# Patient Record
Sex: Female | Born: 1975
Health system: Southern US, Community
[De-identification: ages and names within clinical notes are randomized; demographics above are authoritative.]

## PROBLEM LIST (undated history)

## (undated) DIAGNOSIS — N760 Acute vaginitis: Secondary | ICD-10-CM

## (undated) DIAGNOSIS — K2970 Gastritis, unspecified, without bleeding: Secondary | ICD-10-CM

## (undated) DIAGNOSIS — J45909 Unspecified asthma, uncomplicated: Secondary | ICD-10-CM

## (undated) DIAGNOSIS — B9689 Other specified bacterial agents as the cause of diseases classified elsewhere: Secondary | ICD-10-CM

## (undated) DIAGNOSIS — J329 Chronic sinusitis, unspecified: Secondary | ICD-10-CM

## (undated) DIAGNOSIS — O00109 Unspecified tubal pregnancy without intrauterine pregnancy: Secondary | ICD-10-CM

## (undated) DIAGNOSIS — N898 Other specified noninflammatory disorders of vagina: Secondary | ICD-10-CM

## (undated) DIAGNOSIS — R079 Chest pain, unspecified: Secondary | ICD-10-CM

## (undated) DIAGNOSIS — N200 Calculus of kidney: Secondary | ICD-10-CM

## (undated) DIAGNOSIS — N6310 Unspecified lump in the right breast, unspecified quadrant: Secondary | ICD-10-CM

## (undated) DIAGNOSIS — I10 Essential (primary) hypertension: Secondary | ICD-10-CM

## (undated) DIAGNOSIS — N644 Mastodynia: Secondary | ICD-10-CM

## (undated) HISTORY — DX: Mastodynia: N64.4

## (undated) HISTORY — PX: CYSTECTOMY: SUR359

## (undated) HISTORY — PX: OTHER SURGICAL HISTORY: SHX169

## (undated) HISTORY — PX: TUBAL LIGATION: SHX77

## (undated) HISTORY — DX: Gastritis, unspecified, without bleeding: K29.70

## (undated) HISTORY — DX: Acute vaginitis: N76.0

## (undated) HISTORY — DX: Other specified bacterial agents as the cause of diseases classified elsewhere: B96.89

## (undated) HISTORY — DX: Chronic sinusitis, unspecified: J32.9

## (undated) HISTORY — DX: Essential (primary) hypertension: I10

## (undated) HISTORY — DX: Other specified noninflammatory disorders of vagina: N89.8

## (undated) HISTORY — DX: Chest pain, unspecified: R07.9

## (undated) HISTORY — DX: Unspecified lump in the right breast, unspecified quadrant: N63.10

## (undated) HISTORY — DX: Unspecified tubal pregnancy without intrauterine pregnancy: O00.109

---

## 2001-03-19 ENCOUNTER — Emergency Department (HOSPITAL_COMMUNITY): Admission: EM | Admit: 2001-03-19 | Discharge: 2001-03-19 | Payer: Self-pay | Admitting: *Deleted

## 2001-05-17 ENCOUNTER — Encounter: Payer: Self-pay | Admitting: Emergency Medicine

## 2001-05-17 ENCOUNTER — Emergency Department (HOSPITAL_COMMUNITY): Admission: EM | Admit: 2001-05-17 | Discharge: 2001-05-17 | Payer: Self-pay | Admitting: Emergency Medicine

## 2002-03-20 ENCOUNTER — Emergency Department (HOSPITAL_COMMUNITY): Admission: EM | Admit: 2002-03-20 | Discharge: 2002-03-20 | Payer: Self-pay | Admitting: Emergency Medicine

## 2002-08-09 ENCOUNTER — Emergency Department (HOSPITAL_COMMUNITY): Admission: EM | Admit: 2002-08-09 | Discharge: 2002-08-09 | Payer: Self-pay | Admitting: Emergency Medicine

## 2002-08-09 ENCOUNTER — Encounter: Payer: Self-pay | Admitting: Emergency Medicine

## 2003-08-11 ENCOUNTER — Emergency Department (HOSPITAL_COMMUNITY): Admission: EM | Admit: 2003-08-11 | Discharge: 2003-08-11 | Payer: Self-pay | Admitting: Emergency Medicine

## 2003-10-05 ENCOUNTER — Inpatient Hospital Stay (HOSPITAL_COMMUNITY): Admission: AD | Admit: 2003-10-05 | Discharge: 2003-10-05 | Payer: Self-pay | Admitting: *Deleted

## 2003-10-05 ENCOUNTER — Encounter: Payer: Self-pay | Admitting: Emergency Medicine

## 2003-10-08 ENCOUNTER — Inpatient Hospital Stay (HOSPITAL_COMMUNITY): Admission: AD | Admit: 2003-10-08 | Discharge: 2003-10-08 | Payer: Self-pay | Admitting: Obstetrics and Gynecology

## 2003-10-11 ENCOUNTER — Inpatient Hospital Stay (HOSPITAL_COMMUNITY): Admission: AD | Admit: 2003-10-11 | Discharge: 2003-10-11 | Payer: Self-pay | Admitting: *Deleted

## 2003-10-18 ENCOUNTER — Inpatient Hospital Stay (HOSPITAL_COMMUNITY): Admission: AD | Admit: 2003-10-18 | Discharge: 2003-10-18 | Payer: Self-pay | Admitting: *Deleted

## 2003-10-25 ENCOUNTER — Inpatient Hospital Stay (HOSPITAL_COMMUNITY): Admission: AD | Admit: 2003-10-25 | Discharge: 2003-10-25 | Payer: Self-pay | Admitting: *Deleted

## 2003-11-01 ENCOUNTER — Inpatient Hospital Stay (HOSPITAL_COMMUNITY): Admission: AD | Admit: 2003-11-01 | Discharge: 2003-11-01 | Payer: Self-pay | Admitting: *Deleted

## 2003-11-03 ENCOUNTER — Inpatient Hospital Stay (HOSPITAL_COMMUNITY): Admission: AD | Admit: 2003-11-03 | Discharge: 2003-11-03 | Payer: Self-pay | Admitting: *Deleted

## 2003-11-05 ENCOUNTER — Inpatient Hospital Stay (HOSPITAL_COMMUNITY): Admission: AD | Admit: 2003-11-05 | Discharge: 2003-11-05 | Payer: Self-pay | Admitting: *Deleted

## 2003-11-08 ENCOUNTER — Inpatient Hospital Stay (HOSPITAL_COMMUNITY): Admission: AD | Admit: 2003-11-08 | Discharge: 2003-11-08 | Payer: Self-pay | Admitting: *Deleted

## 2003-11-12 ENCOUNTER — Inpatient Hospital Stay (HOSPITAL_COMMUNITY): Admission: AD | Admit: 2003-11-12 | Discharge: 2003-11-12 | Payer: Self-pay | Admitting: Obstetrics and Gynecology

## 2003-11-29 ENCOUNTER — Inpatient Hospital Stay (HOSPITAL_COMMUNITY): Admission: AD | Admit: 2003-11-29 | Discharge: 2003-11-29 | Payer: Self-pay | Admitting: Obstetrics & Gynecology

## 2003-12-25 ENCOUNTER — Ambulatory Visit: Payer: Self-pay | Admitting: *Deleted

## 2004-05-19 ENCOUNTER — Inpatient Hospital Stay (HOSPITAL_COMMUNITY): Admission: AD | Admit: 2004-05-19 | Discharge: 2004-05-19 | Payer: Self-pay | Admitting: Obstetrics and Gynecology

## 2004-06-03 ENCOUNTER — Inpatient Hospital Stay (HOSPITAL_COMMUNITY): Admission: AD | Admit: 2004-06-03 | Discharge: 2004-06-03 | Payer: Self-pay | Admitting: Obstetrics & Gynecology

## 2004-07-01 ENCOUNTER — Ambulatory Visit (HOSPITAL_COMMUNITY): Admission: RE | Admit: 2004-07-01 | Discharge: 2004-07-01 | Payer: Self-pay | Admitting: Family Medicine

## 2004-07-08 ENCOUNTER — Inpatient Hospital Stay (HOSPITAL_COMMUNITY): Admission: AD | Admit: 2004-07-08 | Discharge: 2004-07-08 | Payer: Self-pay | Admitting: *Deleted

## 2004-07-15 ENCOUNTER — Inpatient Hospital Stay (HOSPITAL_COMMUNITY): Admission: AD | Admit: 2004-07-15 | Discharge: 2004-07-15 | Payer: Self-pay | Admitting: *Deleted

## 2004-07-18 ENCOUNTER — Ambulatory Visit: Payer: Self-pay | Admitting: Cardiovascular Disease

## 2004-09-05 ENCOUNTER — Ambulatory Visit: Payer: Self-pay

## 2004-09-09 ENCOUNTER — Encounter (HOSPITAL_COMMUNITY): Admission: RE | Admit: 2004-09-09 | Discharge: 2004-10-09 | Payer: Self-pay | Admitting: Obstetrics

## 2004-10-14 ENCOUNTER — Encounter (HOSPITAL_COMMUNITY): Admission: RE | Admit: 2004-10-14 | Discharge: 2004-11-13 | Payer: Self-pay | Admitting: Obstetrics

## 2004-11-14 ENCOUNTER — Inpatient Hospital Stay (HOSPITAL_COMMUNITY): Admission: AD | Admit: 2004-11-14 | Discharge: 2004-11-14 | Payer: Self-pay | Admitting: Obstetrics

## 2004-12-26 ENCOUNTER — Inpatient Hospital Stay (HOSPITAL_COMMUNITY): Admission: AD | Admit: 2004-12-26 | Discharge: 2004-12-26 | Payer: Self-pay | Admitting: Obstetrics

## 2005-01-08 ENCOUNTER — Inpatient Hospital Stay (HOSPITAL_COMMUNITY): Admission: AD | Admit: 2005-01-08 | Discharge: 2005-01-09 | Payer: Self-pay | Admitting: Obstetrics

## 2005-01-17 ENCOUNTER — Inpatient Hospital Stay (HOSPITAL_COMMUNITY): Admission: AD | Admit: 2005-01-17 | Discharge: 2005-01-20 | Payer: Self-pay | Admitting: Obstetrics

## 2005-08-26 ENCOUNTER — Emergency Department (HOSPITAL_COMMUNITY): Admission: EM | Admit: 2005-08-26 | Discharge: 2005-08-26 | Payer: Self-pay | Admitting: Family Medicine

## 2005-11-20 ENCOUNTER — Emergency Department (HOSPITAL_COMMUNITY): Admission: EM | Admit: 2005-11-20 | Discharge: 2005-11-20 | Payer: Self-pay | Admitting: Emergency Medicine

## 2006-02-01 ENCOUNTER — Inpatient Hospital Stay (HOSPITAL_COMMUNITY): Admission: AD | Admit: 2006-02-01 | Discharge: 2006-02-01 | Payer: Self-pay | Admitting: Gynecology

## 2006-02-03 ENCOUNTER — Inpatient Hospital Stay (HOSPITAL_COMMUNITY): Admission: AD | Admit: 2006-02-03 | Discharge: 2006-02-03 | Payer: Self-pay | Admitting: Gynecology

## 2006-02-05 ENCOUNTER — Ambulatory Visit: Payer: Self-pay | Admitting: Obstetrics & Gynecology

## 2006-02-05 ENCOUNTER — Ambulatory Visit (HOSPITAL_COMMUNITY): Admission: RE | Admit: 2006-02-05 | Discharge: 2006-02-05 | Payer: Self-pay | Admitting: Gynecology

## 2006-02-05 ENCOUNTER — Encounter (INDEPENDENT_AMBULATORY_CARE_PROVIDER_SITE_OTHER): Payer: Self-pay | Admitting: *Deleted

## 2006-02-11 ENCOUNTER — Ambulatory Visit: Payer: Self-pay | Admitting: *Deleted

## 2006-02-11 ENCOUNTER — Inpatient Hospital Stay (HOSPITAL_COMMUNITY): Admission: AD | Admit: 2006-02-11 | Discharge: 2006-02-11 | Payer: Self-pay | Admitting: Obstetrics and Gynecology

## 2006-03-05 ENCOUNTER — Ambulatory Visit: Payer: Self-pay | Admitting: Family Medicine

## 2006-03-17 ENCOUNTER — Inpatient Hospital Stay (HOSPITAL_COMMUNITY): Admission: AD | Admit: 2006-03-17 | Discharge: 2006-03-17 | Payer: Self-pay | Admitting: Obstetrics & Gynecology

## 2006-03-19 ENCOUNTER — Inpatient Hospital Stay (HOSPITAL_COMMUNITY): Admission: AD | Admit: 2006-03-19 | Discharge: 2006-03-19 | Payer: Self-pay | Admitting: Obstetrics and Gynecology

## 2006-03-21 ENCOUNTER — Ambulatory Visit: Payer: Self-pay | Admitting: Gynecology

## 2006-03-21 ENCOUNTER — Encounter (INDEPENDENT_AMBULATORY_CARE_PROVIDER_SITE_OTHER): Payer: Self-pay | Admitting: Specialist

## 2006-03-21 ENCOUNTER — Ambulatory Visit (HOSPITAL_COMMUNITY): Admission: AD | Admit: 2006-03-21 | Discharge: 2006-03-21 | Payer: Self-pay | Admitting: Family Medicine

## 2006-05-06 ENCOUNTER — Ambulatory Visit: Payer: Self-pay | Admitting: Gynecology

## 2006-06-24 ENCOUNTER — Emergency Department (HOSPITAL_COMMUNITY): Admission: EM | Admit: 2006-06-24 | Discharge: 2006-06-24 | Payer: Self-pay | Admitting: Family Medicine

## 2006-09-27 ENCOUNTER — Emergency Department (HOSPITAL_COMMUNITY): Admission: EM | Admit: 2006-09-27 | Discharge: 2006-09-27 | Payer: Self-pay | Admitting: Emergency Medicine

## 2006-12-01 ENCOUNTER — Emergency Department (HOSPITAL_COMMUNITY): Admission: EM | Admit: 2006-12-01 | Discharge: 2006-12-01 | Payer: Self-pay | Admitting: Emergency Medicine

## 2006-12-02 ENCOUNTER — Emergency Department (HOSPITAL_COMMUNITY): Admission: EM | Admit: 2006-12-02 | Discharge: 2006-12-02 | Payer: Self-pay | Admitting: Emergency Medicine

## 2007-01-20 ENCOUNTER — Inpatient Hospital Stay (HOSPITAL_COMMUNITY): Admission: AD | Admit: 2007-01-20 | Discharge: 2007-01-20 | Payer: Self-pay | Admitting: Obstetrics and Gynecology

## 2007-04-15 ENCOUNTER — Emergency Department (HOSPITAL_COMMUNITY): Admission: EM | Admit: 2007-04-15 | Discharge: 2007-04-15 | Payer: Self-pay | Admitting: Family Medicine

## 2007-07-07 ENCOUNTER — Emergency Department (HOSPITAL_COMMUNITY): Admission: EM | Admit: 2007-07-07 | Discharge: 2007-07-07 | Payer: Self-pay | Admitting: Emergency Medicine

## 2008-03-14 ENCOUNTER — Emergency Department (HOSPITAL_COMMUNITY): Admission: EM | Admit: 2008-03-14 | Discharge: 2008-03-14 | Payer: Self-pay | Admitting: Family Medicine

## 2008-05-30 ENCOUNTER — Emergency Department (HOSPITAL_COMMUNITY): Admission: EM | Admit: 2008-05-30 | Discharge: 2008-05-30 | Payer: Self-pay | Admitting: Family Medicine

## 2008-06-21 ENCOUNTER — Encounter (INDEPENDENT_AMBULATORY_CARE_PROVIDER_SITE_OTHER): Payer: Self-pay | Admitting: *Deleted

## 2008-06-21 DIAGNOSIS — F329 Major depressive disorder, single episode, unspecified: Secondary | ICD-10-CM

## 2008-09-05 ENCOUNTER — Emergency Department (HOSPITAL_COMMUNITY): Admission: EM | Admit: 2008-09-05 | Discharge: 2008-09-05 | Payer: Self-pay | Admitting: Family Medicine

## 2008-09-13 ENCOUNTER — Other Ambulatory Visit: Admission: RE | Admit: 2008-09-13 | Discharge: 2008-09-13 | Payer: Self-pay | Admitting: Family Medicine

## 2008-09-13 ENCOUNTER — Encounter: Payer: Self-pay | Admitting: Family Medicine

## 2008-09-13 ENCOUNTER — Ambulatory Visit: Payer: Self-pay | Admitting: Family Medicine

## 2008-09-13 DIAGNOSIS — N920 Excessive and frequent menstruation with regular cycle: Secondary | ICD-10-CM | POA: Insufficient documentation

## 2008-09-13 DIAGNOSIS — G43909 Migraine, unspecified, not intractable, without status migrainosus: Secondary | ICD-10-CM | POA: Insufficient documentation

## 2008-09-13 DIAGNOSIS — F17200 Nicotine dependence, unspecified, uncomplicated: Secondary | ICD-10-CM | POA: Insufficient documentation

## 2008-09-13 DIAGNOSIS — B9689 Other specified bacterial agents as the cause of diseases classified elsewhere: Secondary | ICD-10-CM

## 2008-09-13 DIAGNOSIS — O00109 Unspecified tubal pregnancy without intrauterine pregnancy: Secondary | ICD-10-CM

## 2008-09-13 DIAGNOSIS — N739 Female pelvic inflammatory disease, unspecified: Secondary | ICD-10-CM | POA: Insufficient documentation

## 2008-09-13 HISTORY — DX: Other specified bacterial agents as the cause of diseases classified elsewhere: B96.89

## 2008-09-13 HISTORY — DX: Unspecified tubal pregnancy without intrauterine pregnancy: O00.109

## 2008-09-13 LAB — CONVERTED CEMR LAB
BUN: 12 mg/dL (ref 6–23)
CO2: 27 meq/L (ref 19–32)
Chloride: 105 meq/L (ref 96–112)
Creatinine, Ser: 0.69 mg/dL (ref 0.40–1.20)
HCT: 38.9 % (ref 36.0–46.0)
Hemoglobin: 13.8 g/dL (ref 12.0–15.0)
Platelets: 234 10*3/uL (ref 150–400)
WBC: 5.7 10*3/uL (ref 4.0–10.5)

## 2008-09-20 ENCOUNTER — Encounter: Payer: Self-pay | Admitting: *Deleted

## 2008-10-01 ENCOUNTER — Ambulatory Visit (HOSPITAL_COMMUNITY): Admission: RE | Admit: 2008-10-01 | Discharge: 2008-10-01 | Payer: Self-pay | Admitting: Family Medicine

## 2008-10-29 ENCOUNTER — Ambulatory Visit: Payer: Self-pay | Admitting: Family Medicine

## 2008-10-29 DIAGNOSIS — N83209 Unspecified ovarian cyst, unspecified side: Secondary | ICD-10-CM

## 2008-10-29 HISTORY — DX: Unspecified ovarian cyst, unspecified side: N83.209

## 2008-10-31 ENCOUNTER — Telehealth: Payer: Self-pay | Admitting: Family Medicine

## 2008-10-31 ENCOUNTER — Encounter: Payer: Self-pay | Admitting: *Deleted

## 2008-11-14 ENCOUNTER — Telehealth (INDEPENDENT_AMBULATORY_CARE_PROVIDER_SITE_OTHER): Payer: Self-pay | Admitting: *Deleted

## 2008-11-14 ENCOUNTER — Ambulatory Visit (HOSPITAL_COMMUNITY): Admission: RE | Admit: 2008-11-14 | Discharge: 2008-11-14 | Payer: Self-pay | Admitting: Family Medicine

## 2009-01-01 ENCOUNTER — Ambulatory Visit: Payer: Self-pay | Admitting: Family Medicine

## 2009-03-29 ENCOUNTER — Ambulatory Visit: Payer: Self-pay | Admitting: Family Medicine

## 2009-04-18 ENCOUNTER — Ambulatory Visit: Payer: Self-pay | Admitting: Family Medicine

## 2009-04-18 ENCOUNTER — Ambulatory Visit (HOSPITAL_COMMUNITY): Admission: RE | Admit: 2009-04-18 | Discharge: 2009-04-18 | Payer: Self-pay | Admitting: Family Medicine

## 2009-04-18 ENCOUNTER — Encounter: Payer: Self-pay | Admitting: Family Medicine

## 2009-04-18 LAB — CONVERTED CEMR LAB: Chlamydia, DNA Probe: NEGATIVE

## 2009-04-22 ENCOUNTER — Telehealth: Payer: Self-pay | Admitting: Family Medicine

## 2009-08-16 ENCOUNTER — Encounter: Payer: Self-pay | Admitting: Family Medicine

## 2009-09-05 ENCOUNTER — Emergency Department (HOSPITAL_COMMUNITY): Admission: EM | Admit: 2009-09-05 | Discharge: 2009-09-05 | Payer: Self-pay | Admitting: Emergency Medicine

## 2009-11-25 ENCOUNTER — Encounter: Payer: Self-pay | Admitting: Family Medicine

## 2009-12-20 ENCOUNTER — Ambulatory Visit: Payer: Self-pay | Admitting: Family Medicine

## 2009-12-20 DIAGNOSIS — J019 Acute sinusitis, unspecified: Secondary | ICD-10-CM | POA: Insufficient documentation

## 2009-12-23 ENCOUNTER — Encounter: Payer: Self-pay | Admitting: Family Medicine

## 2009-12-23 DIAGNOSIS — J45909 Unspecified asthma, uncomplicated: Secondary | ICD-10-CM | POA: Insufficient documentation

## 2010-03-12 ENCOUNTER — Ambulatory Visit
Admission: RE | Admit: 2010-03-12 | Discharge: 2010-03-12 | Payer: Self-pay | Source: Home / Self Care | Attending: Family Medicine | Admitting: Family Medicine

## 2010-03-12 ENCOUNTER — Ambulatory Visit: Admit: 2010-03-12 | Payer: Self-pay

## 2010-03-12 ENCOUNTER — Encounter: Payer: Self-pay | Admitting: *Deleted

## 2010-03-12 DIAGNOSIS — R21 Rash and other nonspecific skin eruption: Secondary | ICD-10-CM | POA: Insufficient documentation

## 2010-04-01 NOTE — Miscellaneous (Signed)
Summary: ibuprofen refill via fax request  Clinical Lists Changes Needs appointment for any additional refills. Medications: Changed medication from IBUPROFEN 800 MG TABS (IBUPROFEN) one tab by mouth q8 hours as needed for pain to IBUPROFEN 800 MG TABS (IBUPROFEN) one tab by mouth q8 hours as needed for pain - Signed Rx of IBUPROFEN 800 MG TABS (IBUPROFEN) one tab by mouth q8 hours as needed for pain;  #90 x 0;  Signed;  Entered by: Doralee Albino MD;  Authorized by: Doralee Albino MD;  Method used: Handwritten    Prescriptions: IBUPROFEN 800 MG TABS (IBUPROFEN) one tab by mouth q8 hours as needed for pain  #90 x 0   Entered and Authorized by:   Doralee Albino MD   Signed by:   Doralee Albino MD on 11/25/2009   Method used:   Handwritten   RxID:   [id number]

## 2010-04-01 NOTE — Assessment & Plan Note (Signed)
Summary: Sinusitis acute   Vital Signs:  Patient profile:   35 year old female Height:      64 inches Weight:      123.0 pounds BMI:     21.19 Temp:     97.2 degrees F oral Pulse rate:   76 / minute BP sitting:   167 / 85  (left arm) Cuff size:   regular  Vitals Entered By: Gladstone Pih (March 29, 2009 2:32 PM)  CC: "head pressure & pain" Is Patient Diabetic? No Pain Assessment Patient in pain? no        Primary Care Provider:  Lequita Asal  MD  CC:  "head pressure & pain".  History of Present Illness: 34yo F c/o "head pressure"  "Head pressure"- x 9 days.  Worsening.  Tried ibuprofen, flonase, and mucinex.  Associated fevers (Tm 101).  +Rhinorhea, headache, and sinus pain mainly on left side.  Child was sick prior to her symptoms.    Current Medications (verified): 1)  Proventil Hfa 108 (90 Base) Mcg/act Aers (Albuterol Sulfate) .... As Needed 2)  Sumatriptan Succinate 50 Mg Tabs (Sumatriptan Succinate) .... One Tab By Mouth Q2 Hours As Needed For Migraine Symptoms. Do Not Exceed 4 Pills in 24 Hours. 3)  Ibuprofen 800 Mg Tabs (Ibuprofen) .... One Tab By Mouth Q8 Hours As Needed For Pain 4)  Chantix 1 Mg Tabs (Varenicline Tartrate) .... Take One Twice Daily - After  Allergies (verified): 1)  Doxycycline 2)  * Lorocet  Review of Systems      See HPI  Physical Exam  General:  VS Reviewed.Non ill-l appearing, NAD.  Head:  moderate ttp of L frontal and maxillary sinuses Eyes:  no injected conjunctiva Ears:  L ear full of cerumen R ear nl TM and canal Nose:  mild clear drainage; mildly inflammed turbinates but patent nares Mouth:  mild erythema of post pharynx; no exudate tonsils 1+ Neck:  supple, full ROM, no goiter or mass  Lungs:  Normal respiratory effort, chest expands symmetrically. Lungs are clear to auscultation, no crackles or wheezes. Heart:  Normal rate and regular rhythm. S1 and S2 normal without gallop, murmur, click, rub or other extra  sounds. Skin:  nl color and turgor Cervical Nodes:  no lad   Impression & Recommendations:  Problem # 1:  ACUTE SINUSITIS, UNSPECIFIED (ICD-461.9) Assessment New  Tx: Amox 875mg  two times a day x 10 days, flonase, tylenol and motrin.  f/u if symptoms worsen.  Pt has hx of recurrent sinusitis (twice yearly).  If it starts to occur more frequently, she may benefit from a ENT referral.   Her updated medication list for this problem includes:    Fluticasone Propionate 50 Mcg/act Susp (Fluticasone propionate) .Marland Kitchen... 2 spray each nostril daily    Amoxicillin 875 Mg Tabs (Amoxicillin) .Marland Kitchen... 1 tab by mouth two times a day x 10 days  Orders: [facility]- Est Level  3 (99213)  Complete Medication List: 1)  Proventil Hfa 108 (90 Base) Mcg/act Aers (Albuterol sulfate) .... As needed 2)  Sumatriptan Succinate 50 Mg Tabs (Sumatriptan succinate) .... One tab by mouth q2 hours as needed for migraine symptoms. do not exceed 4 pills in 24 hours. 3)  Ibuprofen 800 Mg Tabs (Ibuprofen) .... One tab by mouth q8 hours as needed for pain 4)  Chantix 1 Mg Tabs (Varenicline tartrate) .... Take one twice daily - after 5)  Fluticasone Propionate 50 Mcg/act Susp (Fluticasone propionate) .... 2 spray each nostril daily 6)  Amoxicillin 875 Mg Tabs (Amoxicillin) .Marland Kitchen.. 1 tab by mouth two times a day x 10 days  Patient Instructions: 1)  Please schedule a follow-up appointment as needed if symptoms worsen. 2)  Continue with the flonase.  Start the amoxicillin today.  You can take tylenol 500mg  (2 tablets) every 8 hours as needed for headache. Prescriptions: AMOXICILLIN 875 MG TABS (AMOXICILLIN) 1 tab by mouth two times a day x 10 days  #20 x 0   Entered and Authorized by:   Marisue Ivan  MD   Signed by:   Marisue Ivan  MD on 03/29/2009   Method used:   Electronically to        [facility] (retail)       [address]       [place], [place]  [id number]       Ph: [phone]       Fax:  [phone]   RxID:   [id number] FLUTICASONE PROPIONATE 50 MCG/ACT SUSP (FLUTICASONE PROPIONATE) 2 spray each nostril daily  #1 x 1   Entered and Authorized by:   Marisue Ivan  MD   Signed by:   Marisue Ivan  MD on 03/29/2009   Method used:   Electronically to        [facility] (retail)       [address]       [place], [place]  [address]       Ph: [phone]       Fax: [phone]   RxID:   [id number]

## 2010-04-01 NOTE — Miscellaneous (Signed)
  Clinical Lists Changes  Problems: Removed problem of PRODUCTIVE COUGH (ICD-786.2) Removed problem of VAGINAL DISCHARGE (ICD-623.5) Removed problem of RLQ PAIN (ICD-789.03) Removed problem of ROUTINE GYNECOLOGICAL EXAMINATION (ICD-V72.31)

## 2010-04-01 NOTE — Miscellaneous (Signed)
  Clinical Lists Changes  Problems: Changed problem from ASTHMA (ICD-493.90) to ASTHMA, INTERMITTENT (ICD-493.90) 

## 2010-04-01 NOTE — Progress Notes (Signed)
Summary: labs  Phone Note Outgoing Call   Call placed by: Lequita Asal  MD,  April 22, 2009 9:09 AM Summary of Call: please inform that GC/Chl test normal. labs only significant for BV as she was already informed Initial call taken by: Lequita Asal  MD,  April 22, 2009 9:09 AM    called pt and did not get answer and could not leave vm.Loralee Pacas CMA  April 22, 2009 10:06 AM  Appended Document: labs Pt notified

## 2010-04-01 NOTE — Assessment & Plan Note (Signed)
Summary: cold congestion,tcb   Vital Signs:  Patient profile:   35 year old female Weight:      128 pounds Temp:     98.6 degrees F oral Pulse rate:   76 / minute Pulse rhythm:   regular BP sitting:   128 / 88  (left arm) Cuff size:   regular  Vitals Entered By: Loralee Pacas CMA (December 20, 2009 11:06 AM)  Primary Care Provider:  Edd Arbour  CC:  sinus pain and cough .  History of Present Illness: 1) Sinus pain, cough: Reports worsening sinus pain and cough with purulent sputum x 2 weeks. +sick contac = kids at home w/ URI symptoms. Reports some mild occasional dyspnea as well - but has not had to use her albuterol inhaler. Reports subjective fever relieved by ibuprofen. Reports sinus pain and pressure. Quit smoking 2 months ago. Taking Mucinex, using Flonase with some relief   2) Tobacco use: quit smoking two months ago with Chantix. Denies side effects.   Denies chest pain / tightness, wheeze, nausea, vomiting or diarrhea, abdominal pain, ear pain / drainage   Med rec as below except for AMOXICILLIN   Current Medications (verified): 1)  Proventil Hfa 108 (90 Base) Mcg/act Aers (Albuterol Sulfate) .... As Needed 2)  Sumatriptan Succinate 50 Mg Tabs (Sumatriptan Succinate) .... One Tab By Mouth Q2 Hours As Needed For Migraine Symptoms. Do Not Exceed 4 Pills in 24 Hours. 3)  Ibuprofen 800 Mg Tabs (Ibuprofen) .... One Tab By Mouth Q8 Hours As Needed For Pain 4)  Chantix 1 Mg Tabs (Varenicline Tartrate) .... Take One Twice Daily - After 5)  Fluticasone Propionate 50 Mcg/act Susp (Fluticasone Propionate) .... 2 Spray Each Nostril Daily 6)  Amoxicillin 875 Mg Tabs (Amoxicillin) .Marland Kitchen.. 1 Tab By Mouth Two Times A Day X 10 Days  Allergies (verified): 1)  Doxycycline 2)  * Lorocet  Physical Exam  General:  VS Reviewed. Mildly ill-appearing, NAD.  Head:  + tender to palpation over frontal and maxillary sinuses  Eyes:  no conjunctivitis or drainage  Ears:  L ear full of  cerumen R ear nl TM and canal clear  Nose:  +congestion w/o rhinorrhea  Mouth:  moist membranes w/o erythema or exudate  Neck:  no lymphadenopathy   Lungs:  CTAB w/o wheeze or crackles  Heart:  normal rate, regular rhythm, and no murmur.   Abdomen:  soft, non-tender, normal bowel sounds, and no masses.   Skin:  no rashes.     Impression & Recommendations:  Problem # 1:  ACUTE SINUSITIS, UNSPECIFIED (ICD-461.9) Assessment New  Given worsening of symptoms, duration of symptoms, purulence, subjective fever, will treat with antibiotics as below. Continue Flonase. Follow up red flags reviewed.  Her updated medication list for this problem includes:    Fluticasone Propionate 50 Mcg/act Susp (Fluticasone propionate) .Marland Kitchen... 2 spray each nostril daily    Amoxicillin 875 Mg Tabs (Amoxicillin) .Marland Kitchen... 1 tab by mouth two times a day x 10 days  Orders: [facility]- Est  Level 4 (95621)  Problem # 2:  NICOTINE ADDICTION (ICD-305.1) Assessment: Improved  Tolerating Chantix well. Quit x 2 months. Congratulated on efforts to stay quit and counseled on pitfalls.   Her updated medication list for this problem includes:    Chantix 1 Mg Tabs (Varenicline tartrate) .Marland Kitchen... Take one twice daily - after  Orders: [facility]- Est  Level 4 (99214)  Complete Medication List: 1)  Proventil Hfa 108 (90 Base) Mcg/act Aers (Albuterol sulfate) .Marland KitchenMarland KitchenMarland Kitchen  As needed 2)  Sumatriptan Succinate 50 Mg Tabs (Sumatriptan succinate) .... One tab by mouth q2 hours as needed for migraine symptoms. do not exceed 4 pills in 24 hours. 3)  Ibuprofen 800 Mg Tabs (Ibuprofen) .... One tab by mouth q8 hours as needed for pain 4)  Chantix 1 Mg Tabs (Varenicline tartrate) .... Take one twice daily - after 5)  Fluticasone Propionate 50 Mcg/act Susp (Fluticasone propionate) .... 2 spray each nostril daily 6)  Amoxicillin 875 Mg Tabs (Amoxicillin) .Marland Kitchen.. 1 tab by mouth two times a day x 10 days Prescriptions: AMOXICILLIN 875 MG TABS (AMOXICILLIN) 1 tab by  mouth two times a day x 10 days  #20 x 0   Entered and Authorized by:   Bobby Rumpf  MD   Signed by:   Bobby Rumpf  MD on 12/20/2009   Method used:   Electronically to        [facility] [address] (retail)       [address]       [place], [place]  [id number]       Ph: [phone]       Fax: [phone]   RxID:   [id number]    Orders Added: 1)  [facility]- Est  Level 4 [24401]

## 2010-04-01 NOTE — Assessment & Plan Note (Signed)
Summary: sinus inf,tcb   Vital Signs:  Patient profile:   35 year old female Weight:      125.1 pounds Temp:     98.7 degrees F oral Pulse rate:   76 / minute Pulse rhythm:   regular BP sitting:   131 / 92  (right arm) Cuff size:   regular  Vitals Entered By: Loralee Pacas CMA (April 18, 2009 11:03 AM)  Primary Care Provider:  Lequita Asal  MD  CC:  cough and vaginal discharge.  History of Present Illness: 35 y/o female here c/o cough, vaginal discharge  cough- evaluated and treated for acute sinusitis  ~3 weeks ago with augementin. feels like symptoms have migrated to her chest. no trouble breathing, chest pain, fever, chills. +productive cough. h/o tobacco abuse. currently on chantix. quit x1 week.   vaginal discharge- reports thick, white discharge soon after antibiotics. did 7 day course of miconazole OTC. would like to be checked for STIs because partner formerly cheated. intermittent RLQ pain which has been chronic. no dysuria, fever, constipation/diarrhea, n/v.   Habits & Providers  Alcohol-Tobacco-Diet     Tobacco Status: quit < 6 months     Tobacco Counseling: to remain off tobacco products  Current Medications (verified): 1)  Proventil Hfa 108 (90 Base) Mcg/act Aers (Albuterol Sulfate) .... As Needed 2)  Sumatriptan Succinate 50 Mg Tabs (Sumatriptan Succinate) .... One Tab By Mouth Q2 Hours As Needed For Migraine Symptoms. Do Not Exceed 4 Pills in 24 Hours. 3)  Ibuprofen 800 Mg Tabs (Ibuprofen) .... One Tab By Mouth Q8 Hours As Needed For Pain 4)  Chantix 1 Mg Tabs (Varenicline Tartrate) .... Take One Twice Daily - After 5)  Fluticasone Propionate 50 Mcg/act Susp (Fluticasone Propionate) .... 2 Spray Each Nostril Daily 6)  Amoxicillin 875 Mg Tabs (Amoxicillin) .Marland Kitchen.. 1 Tab By Mouth Two Times A Day X 10 Days  Allergies (verified): 1)  Doxycycline 2)  * Lorocet  Past History:  Past Medical History: Asthma Depression- after miscarriage in 2008 Irregular  heart beat - during pregnancy in 2006 Tubal pregnancies- 2007 and 2008, both tubes ruptured  Social History: Smoking Status:  quit < 6 months  Physical Exam  General:  VS Reviewed. Non ill-l appearing, NAD.  Head:  no ttp of L frontal and maxillary sinuses Eyes:  no injected conjunctiva Nose:  no rhinorrhea Mouth:  Oral mucosa and oropharynx without lesions or exudates.  Teeth in good repair. Neck:  No deformities, masses, or tenderness noted. Lungs:  Normal respiratory effort, chest expands symmetrically. Lungs are clear to auscultation, no crackles or wheezes. Heart:  Normal rate and regular rhythm. S1 and S2 normal without gallop, murmur, click, rub or other extra sounds.   Impression & Recommendations:  Problem # 1:  PRODUCTIVE COUGH (ICD-786.2) Assessment New no evidence of pneumonia on CXR. recent antibiotics course, so dont think role for additional antibiotics. monitor for now. given h/o tobacco abuse, ?need for steroids. hold off for now. patient to call if symptoms worsen  Orders: CXR- 2view (CXR) [facility]- Est  Level 4 (45409)  Problem # 2:  VAGINAL DISCHARGE (ICD-623.5) Assessment: New wet prep with BV. will check GC/Chl.   Orders: Wet Prep- [facility] [phone]) GC/Chlamydia-[facility] (87591/87491) [facility]- Est  Level 4 (47829)  Complete Medication List: 1)  Proventil Hfa 108 (90 Base) Mcg/act Aers (Albuterol sulfate) .... As needed 2)  Sumatriptan Succinate 50 Mg Tabs (Sumatriptan succinate) .... One tab by mouth q2 hours as needed for migraine symptoms. do  not exceed 4 pills in 24 hours. 3)  Ibuprofen 800 Mg Tabs (Ibuprofen) .... One tab by mouth q8 hours as needed for pain 4)  Chantix 1 Mg Tabs (Varenicline tartrate) .... Take one twice daily - after 5)  Fluticasone Propionate 50 Mcg/act Susp (Fluticasone propionate) .... 2 spray each nostril daily 6)  Amoxicillin 875 Mg Tabs (Amoxicillin) .Marland Kitchen.. 1 tab by mouth two times a day x 10 days  Laboratory Results  Date/Time Received:  April 18, 2009 11:07 AM  Date/Time Reported: April 18, 2009 11:27 AM   Wet [place] Source: vaginal WBC/hpf: 5-10 Bacteria/hpf: 3+ Clue cells/hpf: few Yeast/hpf: none Trichomonas/hpf: none Comments: cocci and rod bacteria present ...........test performed by...........Marland KitchenTerese Door, CMA

## 2010-04-03 NOTE — Assessment & Plan Note (Signed)
Summary: sinus infection/eo   Vital Signs:  Patient profile:   35 year old female Height:      64 inches Weight:      135.3 pounds BMI:     23.31 Temp:     98.1 degrees F oral Pulse rate:   85 / minute BP sitting:   138 / 95  (right arm) Cuff size:   regular  Vitals Entered By: Jimmy Footman, CMA (March 12, 2010 11:34 AM) CC: cold sxs x3 weeks, HA, cough, runny nose (green) Is Patient Diabetic? No Pain Assessment Patient in pain? no      Comments rash on right arm x2 weeks   Primary Care Provider:  Edd Arbour  CC:  cold sxs x3 weeks, HA, cough, and runny nose (green).  History of Present Illness: 35 yo here for acute visit for sinusitis  Sinusitis: Has had 2-3 episodes of acute sinusitis per year.   3 weeks of rhinitis, left side predominant, +nosebleed, yellow green production with cough, postnasal drainage, sore throat.  No fever, dyspnea, vomiting, diarrhea, abd pain.  Taking mucinex without help.  Uses flonase also without improvement.  No antihsitamines.  Stopped smoking 5 months ago.  Rash:  1 month of rash on tops of feet, arms, legs.  Very itchy.  Has tried OTC antifungal for several weeks without improvement.  Has also tried her daughter's triamcinolone without help.  No new contact irritants or medicines.      Habits & Providers  Alcohol-Tobacco-Diet     Tobacco Status: quit  Current Medications (verified): 1)  Proventil Hfa 108 (90 Base) Mcg/act Aers (Albuterol Sulfate) .... As Needed 2)  Sumatriptan Succinate 50 Mg Tabs (Sumatriptan Succinate) .... One Tab By Mouth Q2 Hours As Needed For Migraine Symptoms. Do Not Exceed 4 Pills in 24 Hours. 3)  Ibuprofen 800 Mg Tabs (Ibuprofen) .... One Tab By Mouth Q8 Hours As Needed For Pain 4)  Chantix 1 Mg Tabs (Varenicline Tartrate) .... Take One Twice Daily - After 5)  Fluticasone Propionate 50 Mcg/act Susp (Fluticasone Propionate) .... 2 Spray Each Nostril Daily 6)  Amoxicillin 875 Mg Tabs (Amoxicillin) .Marland Kitchen.. 1  Tab By Mouth Two Times A Day X 10 Days 7)  Clotrimazole 1 % Crea (Clotrimazole) .... Apply To Affected Areas Twice A Day Dispense 60 Gm  Allergies: 1)  Doxycycline 2)  * Lorocet PMH-FH-SH reviewed for relevance  Social History: Smoking Status:  quit  Review of Systems      See HPI  Physical Exam  General:  VS Reviewed. NAD, nasal congestion  Ears:  L ear full of cerumen R ear nl TM and canal clear  Nose:  +congestion w/o rhinorrhea  Mouth:  moist membranes w/o erythema or exudate  Neck:  no lymphadenopathy   Lungs:  CTAB w/o wheeze or crackles  Heart:  normal rate, regular rhythm, and no murmur.   Extremities:  no LE edema Skin:  multiple small 2-5 mm hypoerpigmented macules over left shin, 2-3 cm pale annular lesion over right forearm, right foot scaly annular lesion scraped.   Impression & Recommendations:  Problem # 1:  ACUTE SINUSITIS, UNSPECIFIED (ICD-461.9)  Will treat for acute sinusitis with amoxicillin.   congratulated on smoking cessation.  If continues to have recurrent sinusitis even when not smoking, consider ENT referral.  Her updated medication list for this problem includes:    Fluticasone Propionate 50 Mcg/act Susp (Fluticasone propionate) .Marland Kitchen... 2 spray each nostril daily    Amoxicillin 875 Mg Tabs (Amoxicillin) .Marland KitchenMarland KitchenMarland KitchenMarland Kitchen  1 tab by mouth two times a day x 10 days  Orders: [facility]- Est  Level 4 (86578)  Problem # 2:  SKIN RASH (ICD-782.1) KOH negative although possibly partially treated.  Unclear diagnosis, precepted with Dr. Swaziland.  Will treat empirically with clotrimazole, patient advised to follow-up if no improvement.  Her updated medication list for this problem includes:    Clotrimazole 1 % Crea (Clotrimazole) .Marland Kitchen... Apply to affected areas twice a day dispense 60 gm  Orders: [facility] (46962) [facility]- Est  Level 4 (95284)  Problem # 3:  NICOTINE ADDICTION (ICD-305.1)  Has stopped smoking for 5 months.  Advised may discontinue chantix when prescription  from Dr. Raymondo Band is complete.  Her updated medication list for this problem includes:    Chantix 1 Mg Tabs (Varenicline tartrate) .Marland Kitchen... Take one twice daily - after  Orders: [facility]- Est  Level 4 ([address]214)  Complete Medication List: 1)  Proventil Hfa 108 (90 Base) Mcg/act Aers (Albuterol sulfate) .... As needed 2)  Sumatriptan Succinate 50 Mg Tabs (Sumatriptan succinate) .... One tab by mouth q2 hours as needed for migraine symptoms. do not exceed 4 pills in 24 hours. 3)  Ibuprofen 800 Mg Tabs (Ibuprofen) .... One tab by mouth q8 hours as needed for pain 4)  Chantix 1 Mg Tabs (Varenicline tartrate) .... Take one twice daily - after 5)  Fluticasone Propionate 50 Mcg/act Susp (Fluticasone propionate) .... 2 spray each nostril daily 6)  Amoxicillin 875 Mg Tabs (Amoxicillin) .Marland Kitchen.. 1 tab by mouth two times a day x 10 days 7)  Clotrimazole 1 % Crea (Clotrimazole) .... Apply to affected areas twice a day dispense 60 gm  Patient Instructions: 1)  Clotrimazole for rash, follow-up in 1 month 2)  Amoxicillin for sinus infection 3)  Congrats on stopping smoking! Prescriptions: AMOXICILLIN 875 MG TABS (AMOXICILLIN) 1 tab by mouth two times a day x 10 days  #20 x 0   Entered and Authorized by:   Delbert Harness MD   Signed by:   Delbert Harness MD on 03/12/2010   Method used:   Electronically to        [facility] [address] (retail)       [address]       [name], [place]  [id number]       Ph: [phone]       Fax: [phone]   RxID:   [id number] CLOTRIMAZOLE 1 % CREA (CLOTRIMAZOLE) apply to affected areas twice a day Dispense 60 gm  #1 x 0   Entered and Authorized by:   Delbert Harness MD   Signed by:   Delbert Harness MD on 03/12/2010   Method used:   Electronically to        [facility] [address] (retail)       [address]       [place], [place]  [id number]       Ph: [phone]       Fax: [phone]   RxID:   [id number]    Orders Added: 1)  [facility]  [87220] 2)  [facility]- Est  Level 4 [10626]  Appended Document: KOH  = negative    Lab Visit  Laboratory Results  Date/Time Received: March 12, 2010 2:00 PM  Date/Time Reported: March 13, 2010 5:26 PM   Other Tests  Skin KOH: Negative Comments: ...............test performed by......Marland KitchenBonnie A. Swaziland, MLS (ASCP)cm   Orders Today:

## 2010-04-03 NOTE — Letter (Signed)
Summary: Out of Work  [facility]  [address]   [place], [place] [address]   Phone: [phone]  Fax: [phone]    March 12, 2010   Employee:  KINSIE BELFORD    To Whom It May Concern:   For Medical reasons, please excuse the above named employee from work for the following dates:  March 12, 2010    If you need additional information, please feel free to contact our office.         Sincerely,    Jimmy Footman, CMA

## 2010-05-18 LAB — GC/CHLAMYDIA PROBE AMP, GENITAL: Chlamydia, DNA Probe: NEGATIVE

## 2010-05-18 LAB — POCT URINALYSIS DIP (DEVICE)
Protein, ur: NEGATIVE mg/dL
Specific Gravity, Urine: 1.025 (ref 1.005–1.030)
Urobilinogen, UA: 1 mg/dL (ref 0.0–1.0)
pH: 6 (ref 5.0–8.0)

## 2010-05-18 LAB — WET PREP, GENITAL
Clue Cells Wet Prep HPF POC: NONE SEEN
Trich, Wet Prep: NONE SEEN

## 2010-06-03 ENCOUNTER — Inpatient Hospital Stay (INDEPENDENT_AMBULATORY_CARE_PROVIDER_SITE_OTHER)
Admission: RE | Admit: 2010-06-03 | Discharge: 2010-06-03 | Disposition: A | Discharge status: Home / Self Care | Payer: Medicaid Other | Source: Ambulatory Visit | Attending: Family Medicine | Admitting: Family Medicine

## 2010-06-03 DIAGNOSIS — J309 Allergic rhinitis, unspecified: Secondary | ICD-10-CM

## 2010-06-08 LAB — POCT RAPID STREP A (OFFICE): Streptococcus, Group A Screen (Direct): NEGATIVE

## 2010-06-13 ENCOUNTER — Inpatient Hospital Stay (INDEPENDENT_AMBULATORY_CARE_PROVIDER_SITE_OTHER)
Admission: RE | Admit: 2010-06-13 | Discharge: 2010-06-13 | Disposition: A | Discharge status: Home / Self Care | Payer: Medicaid Other | Source: Ambulatory Visit | Attending: Family Medicine | Admitting: Family Medicine

## 2010-06-13 DIAGNOSIS — J019 Acute sinusitis, unspecified: Secondary | ICD-10-CM

## 2010-06-16 LAB — POCT URINALYSIS DIP (DEVICE)
Ketones, ur: NEGATIVE mg/dL
Protein, ur: NEGATIVE mg/dL
Urobilinogen, UA: 0.2 mg/dL (ref 0.0–1.0)
pH: 5.5 (ref 5.0–8.0)

## 2010-06-16 LAB — WET PREP, GENITAL: Trich, Wet Prep: NONE SEEN

## 2010-06-23 ENCOUNTER — Other Ambulatory Visit: Payer: Self-pay | Admitting: Family Medicine

## 2010-06-23 ENCOUNTER — Encounter: Payer: Self-pay | Admitting: Family Medicine

## 2010-06-23 ENCOUNTER — Ambulatory Visit (INDEPENDENT_AMBULATORY_CARE_PROVIDER_SITE_OTHER): Payer: Medicaid Other | Admitting: Family Medicine

## 2010-06-23 VITALS — BP 147/94 | HR 76 | Temp 98.6°F | Ht 63.0 in | Wt 143.0 lb

## 2010-06-23 DIAGNOSIS — R21 Rash and other nonspecific skin eruption: Secondary | ICD-10-CM

## 2010-06-23 MED ORDER — KETOCONAZOLE 2 % EX SHAM: MEDICATED_SHAMPOO | CUTANEOUS | Status: AC

## 2010-06-23 MED ORDER — FLUCONAZOLE 150 MG PO TABS: 150.0000 mg | ORAL_TABLET | ORAL | Status: DC

## 2010-06-23 NOTE — Assessment & Plan Note (Signed)
Skin biopsy procedure:  Pt was consented.  No questions or concerns.  Area was cleaned with betadine and alcohol.  Prepped in normal sterile fashion.  Anesthetized with local Lidocaine.  4mm skin biopsy performed.  Pt tolerated procedure well.  Minimal blood loss.  No immediate complications  DDX include tinea, inflammatory response, allergic reaction, systemic condition.  Presentation seems most consistent with resistant tinea.  Will treat with oral Diflucan and Nizoral shampoo.  Will also get a skin biopsy today to confirm diagnosis.

## 2010-06-23 NOTE — Progress Notes (Signed)
  Subjective:    Patient ID: Gail Bass, female    DOB: [birth year], 35 y.o.   MRN: [id number]  HPI 1. Skin rash:  Has been there for about 6 months.  Started off on her back and then was on her legs.  The spots on her back resolved but they persist on her legs and are getting worse.  Described as starting off as a small, red patch that then expands to have a red border and area of clearing.  She initially tried Triamcinolone cream which didn't help.  Was seen here a couple of months ago and they tried to do a scraping but didn't see anything, specifically no fungus.  They still tried treating her with Clotrimizole which she used for a couple of months and if anything she thinks that the rash got worse.  The rash is itchy.  She has been on Amoxicillin and Augmentin for sinus infections during this time.  She also just recently was diagnosed with vaginal candidiasis and was given a one time Rx of Diflucan which she took today.   Review of Systems Denies fevers, chills, muscle aches, headaches, fatigue, joint pains, concern for GC/Chlamydia infection    Objective:   Physical Exam  Constitutional: She appears well-nourished. No distress.  Eyes: Conjunctivae are normal.  Neck: Normal range of motion. Neck supple.  Cardiovascular: Normal rate and regular rhythm.   Pulmonary/Chest: Effort normal and breath sounds normal.  Abdominal: Soft.  Musculoskeletal: Normal range of motion. She exhibits no tenderness.  Lymphadenopathy:    She has no cervical adenopathy.  Neurological: No cranial nerve deficit. Coordination normal.  Skin: Skin is warm and dry. Rash noted.  Psychiatric:       Multiple areas of skin rash on her legs that goes from her ankles up to her thighs.  Red patches.  Larger areas have well, demarcated, red borders.  No signs of swelling or warmth.  Doesn't appear infected.          Assessment & Plan:

## 2010-06-23 NOTE — Patient Instructions (Signed)
Take one of the Diflucan in 2 days, then take 1 a week

## 2010-06-24 ENCOUNTER — Telehealth: Payer: Self-pay | Admitting: Family Medicine

## 2010-06-24 MED ORDER — CLOBETASOL PROPIONATE 0.05 % EX CREA: TOPICAL_CREAM | Freq: Two times a day (BID) | CUTANEOUS | Status: AC

## 2010-06-24 NOTE — Progress Notes (Signed)
  Subjective:    Patient ID: Gail Bass, female    DOB: 20-Apr-1975, 35 y.o.   MRN: [id number]  HPI Skin biopsy shows: Spongiotic Dermatitis.  Will treat with Clobetasol Cream.  Answered all patients questions and concerns via telephone.   Review of Systems     Objective:   Physical Exam        Assessment & Plan:

## 2010-06-24 NOTE — Telephone Encounter (Signed)
Discussed with patient the results of her skin biopsy.  Indicating spongiotic dermatitis.  Advised her that she doesn't need to take the anti-fungal medication and that I have prescribed a steroid cream for her to use.  Answered all questions and concerns.  Advised her to return to clinic if not better in a couple of weeks.  Pt voiced her understanding.

## 2010-07-07 ENCOUNTER — Other Ambulatory Visit (HOSPITAL_COMMUNITY)
Admission: RE | Admit: 2010-07-07 | Discharge: 2010-07-07 | Disposition: A | Discharge status: Home / Self Care | Payer: Medicaid Other | Source: Ambulatory Visit | Attending: Family Medicine | Admitting: Family Medicine

## 2010-07-07 ENCOUNTER — Other Ambulatory Visit: Payer: Self-pay | Admitting: Family Medicine

## 2010-07-07 ENCOUNTER — Ambulatory Visit (INDEPENDENT_AMBULATORY_CARE_PROVIDER_SITE_OTHER): Payer: Medicaid Other | Admitting: Family Medicine

## 2010-07-07 ENCOUNTER — Encounter: Payer: Self-pay | Admitting: Family Medicine

## 2010-07-07 VITALS — BP 130/87 | HR 61 | Temp 97.9°F | Ht 63.0 in | Wt 138.5 lb

## 2010-07-07 DIAGNOSIS — Z01419 Encounter for gynecological examination (general) (routine) without abnormal findings: Secondary | ICD-10-CM | POA: Insufficient documentation

## 2010-07-07 DIAGNOSIS — Z124 Encounter for screening for malignant neoplasm of cervix: Secondary | ICD-10-CM

## 2010-07-07 DIAGNOSIS — Z0000 Encounter for general adult medical examination without abnormal findings: Secondary | ICD-10-CM

## 2010-07-07 DIAGNOSIS — B9689 Other specified bacterial agents as the cause of diseases classified elsewhere: Secondary | ICD-10-CM

## 2010-07-07 DIAGNOSIS — N760 Acute vaginitis: Secondary | ICD-10-CM

## 2010-07-07 LAB — POCT WET PREP (WET MOUNT)

## 2010-07-07 LAB — POCT URINALYSIS DIPSTICK
Glucose, UA: NEGATIVE
Leukocytes, UA: NEGATIVE
Nitrite, UA: NEGATIVE
Urobilinogen, UA: 0.2

## 2010-07-07 LAB — POCT UA - MICROSCOPIC ONLY

## 2010-07-07 MED ORDER — METRONIDAZOLE 500 MG PO TABS: 500.0000 mg | ORAL_TABLET | Freq: Two times a day (BID) | ORAL | Status: AC

## 2010-07-07 MED ORDER — IBUPROFEN 800 MG PO TABS: 800.0000 mg | ORAL_TABLET | Freq: Three times a day (TID) | ORAL | Status: DC | PRN

## 2010-07-07 NOTE — Progress Notes (Signed)
  Subjective:    Patient ID: Gail Bass, female    DOB: 06-10-1975, 35 y.o.   MRN: [id number]  HPI 1. CPE Normal physical exam, slightly overweight.   2. PAP Performed, normal appearing cervix and vagina.  3. Vaginal Exam for Vaginosis/discharge Normal appearing vagina. Had thin white discharge, not malodorous.  4. Tobacco abuse counseling Patient off chantix, successfully quit since last august.  5. Weight gain Patient thinks this is increased PO intake 2nd to cessation of tobacco use. Counseled her on healthy balanced diet options.  6. Rash to legs. Rash has resolved with he use of clobetasol cream. Bx showed: SPONGIOTIC DERMATITIS   Review of Systems  All other systems reviewed and are negative.       Objective:   Physical Exam  Nursing note and vitals reviewed. Constitutional: She is oriented to person, place, and time. She appears well-developed and well-nourished. No distress.  HENT:  Head: Normocephalic and atraumatic.  Eyes: Conjunctivae and EOM are normal. Pupils are equal, round, and reactive to light. Right eye exhibits no discharge. Left eye exhibits no discharge. No scleral icterus.  Neck: Normal range of motion. Neck supple. No JVD present. No tracheal deviation present. No thyromegaly present.  Cardiovascular: Normal rate, regular rhythm and normal heart sounds.  Exam reveals no gallop and no friction rub.   No murmur heard. Pulmonary/Chest: Breath sounds normal. No stridor. No respiratory distress. She has no wheezes. She has no rales.  Abdominal: Soft. Bowel sounds are normal. She exhibits no distension and no mass. There is no tenderness. There is no rebound and no guarding.  Genitourinary: Vaginal discharge found.  Musculoskeletal: She exhibits no edema and no tenderness.  Lymphadenopathy:    She has no cervical adenopathy.  Neurological: She is alert and oriented to person, place, and time. No cranial nerve deficit.  Skin: Skin is warm. No rash  noted. No erythema.  Psychiatric: She has a normal mood and affect. Her behavior is normal.        Assessment & Plan:  1. CPE Normal physical exam, slightly overweight.   2. PAP Performed, normal appearing cervix and vagina. Will mail letter to patient when results are back.   3. Vaginal Exam for Vaginosis/discharge Normal appearing vagina. Had thin white discharge, not malodorous. Appears like Bacterial vaginosis, will await wet prep results. U/A, GC/CHl, HIV  4. Tobacco abuse counseling Patient off chantix, successfully quit since last august.  5. Weight gain Patient thinks this is increased PO intake 2nd to cessation of tobacco use. Counseled her on healthy balanced diet options.  6. Rash to legs. Rash has resolved with he use of clobetasol cream. Bx showed: SPONGIOTIC DERMATITIS

## 2010-07-08 ENCOUNTER — Encounter: Payer: Self-pay | Admitting: Family Medicine

## 2010-07-08 LAB — GC/CHLAMYDIA PROBE AMP, GENITAL: GC Probe Amp, Genital: NEGATIVE

## 2010-07-08 LAB — HIV ANTIBODY (ROUTINE TESTING W REFLEX): HIV: NONREACTIVE

## 2010-07-18 NOTE — Op Note (Signed)
NAMEMILADY, FLEENER NO.:  [id number]   MEDICAL RECORD NO.:  [id number]          PATIENT TYPE:  AMB   LOCATION:  [facility]                           [facility]ACILITY:  [facility]   PHYSICIAN:  Lesly Dukes, M.D. DATE OF BIRTH:  [birth year]   DATE OF PROCEDURE:  02/05/2006  DATE OF DISCHARGE:                               OPERATIVE REPORT   PREOPERATIVE DIAGNOSIS:  A 35 year old, para 3-0-1-3, with right ectopic  pregnancy, abdominal pain and small hemoperitoneum.   POSTOPERATIVE DIAGNOSIS:  64. A 35 year old, para 3-0-1-3, with right ectopic pregnancy,      abdominal pain and small hemoperitoneum.  2. Paraovarian cyst on the right adnexa.   OPERATION/PROCEDURE:  1. Exploratory laparoscopy.  2. Right salpingectomy.  3. Remove right ectopic pregnancy.  4. Mild lysis of adhesions.   SURGEON:  Lesly Dukes, M.D.  Ginger Carne, M.D.   ANESTHESIA:  General.   SPECIMENS:  Right fallopian tube, ectopic tube, to pathology.   ESTIMATED BLOOD LOSS:  Minimal.   COMPLICATIONS:  None.   FINDINGS:  Normal size and contour uterus. Right ectopic pregnancy with  small amount of blood in the cul-de-sac.  Grossly normal ovaries on both  sides.  Left fallopian tube does not have excessive adhesions.  There  are no adhesions around the liver.   DESCRIPTION OF PROCEDURE:  After informed consent was obtained, the  patient was taken to the operating room where general anesthesia was  induced.  The patient was placed in the dorsal lithotomy position and  prepped and draped in the normal sterile fashion.  Foley was placed in  the bladder.  A Pelosi uterine manipulator was placed into the uterus  via the cervix.  Gown and gloves were changed.  Using the open  technique, an infraumbilical skin incision was made with the scalpel and  again using the open laparoscopy method, the fascia and peritoneum were  identified and bluntly entered.  The Hasson trocar was introduced and  pneumoperitoneum was achieved with CO2.  A laparoscope was introduced  into the abdomen and the right ectopic was found.  Two 5 mm ports were  placed under direct visualization over the left-hand side.  Using the  coagulation, tripolar, the right fallopian tube was removed without any  bleeding or damage to the surrounding structures.  The 10 mm scope was  replaced with a 5 mm scope and Endopouch was introduced into the  umbilical port and ectopic pregnancy was removed from the abdomen.  The  10 mm scope was then replaced and once again hemostasis was assured.  Pneumoperitoneum was reduced and all ports were removed.  Umbilical port  and laparoscopic was removed with one unit to avoid hernia.  The fascia which had already been tagged at the umbilicus was closed  with 0 Vicryl.  All skin incisions were closed with 4-0 Vicryl in  subcuticular fashion.  The patient tolerated the procedure well.  The  Loyal Gambler was removed from the uterus and __________ was found to be  hemostatic.  All counts were correct x2.  ______________________________  Lesly Dukes, M.D.     KHL/MEDQ  D:  02/05/2006  T:  02/05/2006  Job:  [id number]

## 2010-07-18 NOTE — H&P (Signed)
Gail Bass, Gail Bass NO.:  [id number]   MEDICAL RECORD NO.:  [id number]           PATIENT TYPE:   LOCATION:  [facility]                    [facility]   PHYSICIAN:  Allie Bossier, MD        DATE OF BIRTH:  Sep 17, 1975   DATE OF SERVICE:                           PRE-OP HISTORY & PHYSICAL   This is an Operative Note.   PREOPERATIVE DIAGNOSIS:  Left ectopic pregnancy.   POSTOPERATIVE DIAGNOSIS:  Rupturing left ectopic pregnancy.   SURGEONS:  1. Clarisa Kindred, M.D.  2. Alfonzo Feller, M.D.   COMPLICATIONS:  None.   EBL:  200 mL.   SPECIMENS:  A portion of left ova duct.   PROCEDURE:  Left partial salpingectomy via laparoscopy and removal of  left ectopic pregnancy.   DETAILS OF THE PROCEDURE AND FINDINGS:  The risks, benefits and  alternatives of surgery were explained, understood and accepted,  consents were signed.  She was taken to the operating room and placed in  the dorsal lithotomy position.  General anesthesia was then applied, the  vagina and abdomen were prepped and draped in the usual sterile fashion.  A Hulka manipulator was placed.  Gloves were changed, attention was  turned to the abdomen.  A vertical umbilical incision was made.  A  Veress needle was placed intraperitoneally.  CO2 was used to inflate the  abdomen to approximately 2.5L.  An 11 mm trocar was then placed,  laparoscopy confirmed correct placement.  In each lower quadrant, under  direct laparoscopic visualization, 5 mm ports were placed, taking care  to avoid the inferior epigastric vessel.  The patient was placed in  Trendelenburg position and the pelvis was inspected in an alpha manner  beginning with the right adnexa.  The ovary was normal.  The ova duct  was absent.  The left ovary was normal but the left ova duct showed an  obvious ampullary ectopic pregnancy with a moderate amount of blood in  the pelvis, a small amount of blood oozing from the fimbriated end.  Using the  Harmonic scalpel, the distal portion of the ova duct was  removed.  There was a small amount of oozing from the pedicle and the  gyrus was then used to apply cautery and yield excellent hemostasis.  The pelvis was irrigated.  Please note that to facilitate removal of the  specimen.  A third 5 mm port was placed in the patient's left middle  quadrant of her abdomen under direct laparoscopic visualization.  The  specimen was removed via the Endopouch and the pelvis was again  irrigated and inspected; all pedicles were noted to be hemostatic.  She  tolerated the procedure well.  The trocars were all removed, the CO2 was  allowed to escape from the abdomen and the fascia was closed at the  umbilicus with a figure-of-eight #0 Vicryl suture.  The left lower  quadrant port had some oozing and a figure-of-eight suture was placed in  the fascia at this point as well.  Marcaine 0.05% was injected in the  subcutaneous tissue at all sites, #4-0 Vicryl was used to do a  subcuticular closure at all sites.  Instrument, sponge, needle counts  were correct.  Hulka manipulator was removed.  She tolerated the  procedure well.  She was extubated and taken to the recovery room in  stable condition.   Please note that prior to the surgery her bladder was emptied with a  Robinson catheter.      Allie Bossier, MD     MCD/MEDQ  D:  03/21/2006  T:  03/21/2006  Job:  [id number]

## 2010-07-18 NOTE — H&P (Signed)
NAMESEONA, Gail NO.:  [id number]   MEDICAL RECORD NO.:  [id number]          PATIENT TYPE:  MAT   LOCATION:  [facility]                          [facility]   PHYSICIAN:  Allie Bossier, MD        DATE OF BIRTH:  10-09-1975   DATE OF ADMISSION:  03/21/2006  DATE OF DISCHARGE:                              HISTORY & PHYSICAL   Gail Bass is a 35 year old, single, white, gravida 6, para 3 with a  history of two ectopic pregnancies. She comes in with a one-week history  of some spotting.  Her bleeding became heavier two days ago. She has  also complaining of all over pelvic pain.  She says that she was  suspicious for an ectopic several days ago but chose to come today when  her pain became worse.  Of note, she had a laparoscope right  salpingectomy February 05, 2006 for an ectopic.  She previously had  methotrexate in 2005.  She absolutely refuses further methotrexate  treatment.   PAST MEDICAL HISTORY:  Significant for history of abnormal Pap smears,  gonorrhea, Chlamydia in her teenage years and asthma.  Of note, she says  that she uses albuterol inhaler on a p.r.n. basis and she has not used  it at all in the last week.   SOCIAL HISTORY:  She smokes half a pack a day. She reports some  marijuana use in the last three weeks.  She denies cocaine or other  illegal substances.   REVIEW OF SYSTEMS:  She wishes no more children at this point.  She  understands that should she desire children in the future that  reproductive technology would be required if both of her tubes are  absent.   MEDICATIONS:  None.   ALLERGIES:  No known drug allergies. No latex allergy.   PHYSICAL EXAMINATION:  VITAL SIGNS:  Stable, afebrile.  HEENT:  Normal.  HEART:  Regular rate and rhythm.  ABDOMEN:  Tender throughout.  Ultrasound confirmed the finding of a left  gestational sac with measurements of 2.5 x 0.9 x 1.5 cm.  Q Beta HCG is  1455 and CBC and CMETA are pending.   ASSESSMENT/PLAN:  Left ectopic.  Again I have counseled her extensive  about the removal of this tube.  She adamantly declines methotrexate.  Will proceed as planned.  She again was counseled on the risks of  surgery including but not limited to infection, bleeding, and damage to  pelvic organs.  She understands that future pregnancies would require  artificial reproductive techniques.      Allie Bossier, MD  Electronically Signed     MCD/MEDQ  D:  03/21/2006  T:  03/21/2006  Job:  [id number]

## 2010-07-18 NOTE — Group Therapy Note (Signed)
Gail Bass, Gail Bass NO.:  [id number]   MEDICAL RECORD NO.:  [id number]          PATIENT TYPE:  [facility]   LOCATION:  [facility]                   [facility]ACILITY[facility]   PHYSICIAN:  Ellis Parents, MD    DATE OF BIRTH:  [birth year]   DATE OF SERVICE:  12/25/2003                                    CLINIC NOTE   REASON FOR VISIT:  This 35 year old gravida 1 para 0 ectopic 1 was treated  at [facility] in late August for an unruptured tubal pregnancy.  The  treatment was with methotrexate.  The patient's beta hCGs ranged from 105 to  less than 2.  The value of less than 2 was on November 29, 2003.  The  patient is doing well without any complaints.  Her last menstrual period  began 5 days ago.  The patient is using condoms for contraception.  No  physical examination is performed.  The patient is to return in 1 month for  a Pap smear.  She does have a history of dysplasia and at some time in the  past had a colposcopy and cervical biopsy.      SA/MEDQ  D:  12/25/2003  T:  12/25/2003  Job:  [id number]

## 2010-11-14 ENCOUNTER — Inpatient Hospital Stay (INDEPENDENT_AMBULATORY_CARE_PROVIDER_SITE_OTHER)
Admission: RE | Admit: 2010-11-14 | Discharge: 2010-11-14 | Disposition: A | Discharge status: Home / Self Care | Payer: Medicaid Other | Source: Ambulatory Visit | Attending: Emergency Medicine | Admitting: Emergency Medicine

## 2010-11-14 DIAGNOSIS — N760 Acute vaginitis: Secondary | ICD-10-CM

## 2010-11-15 LAB — GC/CHLAMYDIA PROBE AMP, GENITAL
Chlamydia, DNA Probe: NEGATIVE
GC Probe Amp, Genital: NEGATIVE

## 2010-11-26 LAB — GC/CHLAMYDIA PROBE AMP, GENITAL: GC Probe Amp, Genital: NEGATIVE

## 2010-11-26 LAB — POCT PREGNANCY, URINE
Operator id: 239701
Preg Test, Ur: NEGATIVE

## 2010-11-26 LAB — POCT URINALYSIS DIP (DEVICE)
Operator id: 247071
Protein, ur: 30 — AB
Specific Gravity, Urine: 1.025
Urobilinogen, UA: 0.2

## 2010-11-26 LAB — WET PREP, GENITAL

## 2010-12-09 LAB — URINALYSIS, ROUTINE W REFLEX MICROSCOPIC
Hgb urine dipstick: NEGATIVE
Specific Gravity, Urine: >1.03 — ABNORMAL HIGH
Urobilinogen, UA: 0.2

## 2010-12-09 LAB — WET PREP, GENITAL
Trich, Wet Prep: NONE SEEN
Yeast Wet Prep HPF POC: NONE SEEN

## 2010-12-09 LAB — GC/CHLAMYDIA PROBE AMP, GENITAL: GC Probe Amp, Genital: POSITIVE — AB

## 2010-12-11 LAB — POCT URINALYSIS DIP (DEVICE)
Bilirubin Urine: NEGATIVE
Ketones, ur: NEGATIVE
Nitrite: NEGATIVE
Specific Gravity, Urine: 1.01
pH: 6.5

## 2010-12-11 LAB — URINE MICROSCOPIC-ADD ON

## 2010-12-11 LAB — POCT I-STAT CREATININE
Creatinine, Ser: 1
Operator id: 161631

## 2010-12-11 LAB — POCT PREGNANCY, URINE: Preg Test, Ur: NEGATIVE

## 2010-12-11 LAB — CBC
Platelets: 204
RBC: 4.38
WBC: 12.6 — ABNORMAL HIGH

## 2010-12-11 LAB — I-STAT 8, (EC8 V) (CONVERTED LAB)
Acid-Base Excess: 1
HCT: 47 — ABNORMAL HIGH
Hemoglobin: 16 — ABNORMAL HIGH
Operator id: 161631
Potassium: 3.5
Sodium: 135
TCO2: 30
pH, Ven: 7.322 — ABNORMAL HIGH

## 2010-12-11 LAB — URINALYSIS, ROUTINE W REFLEX MICROSCOPIC
Glucose, UA: NEGATIVE
Specific Gravity, Urine: 1.015
pH: 6

## 2010-12-11 LAB — DIFFERENTIAL
Lymphocytes Relative: 9 — ABNORMAL LOW
Lymphs Abs: 1.1
Neutro Abs: 10.5 — ABNORMAL HIGH
Neutrophils Relative %: 83 — ABNORMAL HIGH

## 2010-12-11 LAB — RPR: RPR Ser Ql: NONREACTIVE

## 2010-12-15 LAB — URINE CULTURE

## 2010-12-15 LAB — POCT URINALYSIS DIP (DEVICE)
Glucose, UA: NEGATIVE
Specific Gravity, Urine: 1.02
Urobilinogen, UA: 0.2

## 2011-01-05 ENCOUNTER — Encounter: Payer: Self-pay | Admitting: Family Medicine

## 2011-01-05 ENCOUNTER — Ambulatory Visit (INDEPENDENT_AMBULATORY_CARE_PROVIDER_SITE_OTHER): Payer: Medicaid Other | Admitting: Family Medicine

## 2011-01-05 VITALS — BP 137/83 | HR 69 | Temp 98.3°F | Ht 63.0 in | Wt 146.5 lb

## 2011-01-05 DIAGNOSIS — N760 Acute vaginitis: Secondary | ICD-10-CM

## 2011-01-05 LAB — POCT WET PREP (WET MOUNT)

## 2011-01-05 MED ORDER — METRONIDAZOLE 500 MG PO TABS: 500.0000 mg | ORAL_TABLET | Freq: Two times a day (BID) | ORAL | Status: DC

## 2011-01-05 NOTE — Assessment & Plan Note (Signed)
Will treat bacterial vaginosis today with metronidazole.  Given edu handout on BV.  Discussed preventive teratment with metrogel, not interested at this time.

## 2011-01-05 NOTE — Progress Notes (Signed)
  Subjective:    Patient ID: Gail Bass, female    DOB: 22-Apr-1975, 35 y.o.   MRN: [id number]  HPI VAGINAL DISCHARGE  Onset: 2 days Description: thin Odor: yes  Itching: no    Symptoms Dysuria: no  Bleeding: no  Pelvic pain: yes  Back pain: no  Fever: no  Genital sores: no  Rash: no  Dyspareunia: yes  GI Sxs: no  Prior treatment: yes, 1-2  Months ago was treated adequately for trich and BV   Red Flags: Missed period: no  Pregnancy: no , hx of bilateral tubal rupture Recent antibiotics: yes, 1-2 months ago  Sexual activity: yes  Possible STD exposure: yes  IUD: no  Diabetes: no      Review of Systems     Objective:   Physical Exam        Assessment & Plan:

## 2011-01-05 NOTE — Patient Instructions (Signed)
See handout on bacterial vaginosis

## 2011-01-06 ENCOUNTER — Encounter: Payer: Self-pay | Admitting: Family Medicine

## 2011-01-06 LAB — GC/CHLAMYDIA PROBE AMP, GENITAL
Chlamydia, DNA Probe: NEGATIVE
GC Probe Amp, Genital: NEGATIVE

## 2011-02-20 ENCOUNTER — Encounter: Payer: Self-pay | Admitting: Family Medicine

## 2011-02-20 ENCOUNTER — Ambulatory Visit (INDEPENDENT_AMBULATORY_CARE_PROVIDER_SITE_OTHER): Payer: Medicaid Other | Admitting: Family Medicine

## 2011-02-20 DIAGNOSIS — H6691 Otitis media, unspecified, right ear: Secondary | ICD-10-CM | POA: Insufficient documentation

## 2011-02-20 DIAGNOSIS — J069 Acute upper respiratory infection, unspecified: Secondary | ICD-10-CM

## 2011-02-20 DIAGNOSIS — J309 Allergic rhinitis, unspecified: Secondary | ICD-10-CM

## 2011-02-20 DIAGNOSIS — H6690 Otitis media, unspecified, unspecified ear: Secondary | ICD-10-CM

## 2011-02-20 MED ORDER — KETOROLAC TROMETHAMINE 60 MG/2ML IM SOLN
60.0000 mg | Freq: Once | INTRAMUSCULAR | Status: AC
  Administered 2011-02-20: 60 mg via INTRAMUSCULAR

## 2011-02-20 MED ORDER — CETIRIZINE HCL 10 MG PO TABS: 10.0000 mg | ORAL_TABLET | Freq: Every day | ORAL | Status: DC

## 2011-02-20 MED ORDER — AMOXICILLIN-POT CLAVULANATE 875-125 MG PO TABS: 1.0000 | ORAL_TABLET | Freq: Two times a day (BID) | ORAL | Status: AC

## 2011-02-20 NOTE — Assessment & Plan Note (Signed)
augmentin x 10 days

## 2011-02-20 NOTE — Progress Notes (Signed)
  Subjective:    Patient ID: Gail Bass, female    DOB: Jul 23, 1975, 35 y.o.   MRN: [id number]  HPI Pt presents today with uri xs x 3 days. Pt states that she has noticed nasal congestion, rhinorrhea, headache, generalized malaise, cough, and mild increased WOB over this same time period.  Pt has a baseline hx/o asthma and allegic rhinitis. Pt has been using prn albuterol once to twice per day. Pt has not been using flonase. Pt had Tmax 101.4 last night. + sick contacts in [place] daughter with similar sxs. Pt has another daughter who had flu shot who is asymptomatic. Pt has not had flu shot. No rashes. Has had some intermittent nausea. No emesis. Has been tolerating fluids.  Review of Systems See HPI, otherwise ROS negative      Objective:   Physical Exam Gen: up in chair, mildy ill appearing  HEENT: NCAT, EOMI, R TM with bulging and exudative fluid; +nasal erythema, rhinorrhea bilaterally, + post oropharyngeal erythema  CV: RRR, no murmurs auscultated PULM: CTAB, no wheezes, rales, rhoncii ABD: S/NT/+ bowel sounds  EXT: 2+ peripheral pulses   Assessment & Plan:

## 2011-02-20 NOTE — Patient Instructions (Signed)
Viral Infections A viral infection can be caused by different types of viruses.Most viral infections are not serious and resolve on their own. However, some infections may cause severe symptoms and may lead to further complications. SYMPTOMS Viruses can frequently cause:  Minor sore throat.   Aches and pains.   Headaches.   Runny nose.   Different types of rashes.   Watery eyes.   Tiredness.   Cough.   Loss of appetite.   Gastrointestinal infections, resulting in nausea, vomiting, and diarrhea.  These symptoms do not respond to antibiotics because the infection is not caused by bacteria. However, you might catch a bacterial infection following the viral infection. This is sometimes called a "superinfection." Symptoms of such a bacterial infection may include:  Worsening sore throat with pus and difficulty swallowing.   Swollen neck glands.   Chills and a high or persistent fever.   Severe headache.   Tenderness over the sinuses.   Persistent overall ill feeling (malaise), muscle aches, and tiredness (fatigue).   Persistent cough.   Yellow, green, or brown mucus production with coughing.  HOME CARE INSTRUCTIONS   Only take over-the-counter or prescription medicines for pain, discomfort, diarrhea, or fever as directed by your caregiver.   Drink enough water and fluids to keep your urine clear or pale yellow. Sports drinks can provide valuable electrolytes, sugars, and hydration.   Get plenty of rest and maintain proper nutrition. Soups and broths with crackers or rice are fine.  SEEK IMMEDIATE MEDICAL CARE IF:   You have severe headaches, shortness of breath, chest pain, neck pain, or an unusual rash.   You have uncontrolled vomiting, diarrhea, or you are unable to keep down fluids.   You or your child has an oral temperature above 102 F (38.9 C), not controlled by medicine.   Your baby is older than 3 months with a rectal temperature of 102 F (38.9 C) or  higher.   Your baby is 39 months old or younger with a rectal temperature of 100.4 F (38 C) or higher.  MAKE SURE YOU:   Understand these instructions.   Will watch your condition.   Will get help right away if you are not doing well or get worse.  Document Released: 11/26/2004 Document Revised: 10/29/2010 Document Reviewed: 06/23/2010 [facility] Patient Information 2012 [place], [place].  Otitis Media, Adult A middle ear infection is an infection in the space behind the eardrum. The medical name for this is "otitis media." It may happen after a common cold. It is caused by a germ that starts growing in that space. You may feel swollen glands in your neck on the side of the ear infection. HOME CARE INSTRUCTIONS   Take your medicine as directed until it is gone, even if you feel better after the first few days.   Only take over-the-counter or prescription medicines for pain, discomfort, or fever as directed by your caregiver.   Occasional use of a nasal decongestant a couple times per day may help with discomfort and help the eustachian tube to drain better.  Follow up with your caregiver in 10 to 14 days or as directed, to be certain that the infection has cleared. Not keeping the appointment could result in a chronic or permanent injury, pain, hearing loss and disability. If there is any problem keeping the appointment, you must call back to this facility for assistance. SEEK IMMEDIATE MEDICAL CARE IF:   You are not getting better in 2 to 3 days.  You have pain that is not controlled with medication.   You feel worse instead of better.   You cannot use the medication as directed.   You develop swelling, redness or pain around the ear or stiffness in your neck.  MAKE SURE YOU:   Understand these instructions.   Will watch your condition.   Will get help right away if you are not doing well or get worse.  Document Released: 11/22/2003 Document Revised: 10/29/2010 Document  Reviewed: 09/23/2007 [facility] Patient Information 2012 [place], [place].

## 2011-02-20 NOTE — Assessment & Plan Note (Addendum)
Flu vs flu like illness. Discussed supportive care as well as infectious red flags. Handout given. Will continue to follow. Encouraged flonase and zyrtec use in setting of background allergic rhinitis.

## 2011-04-13 ENCOUNTER — Other Ambulatory Visit: Payer: Self-pay | Admitting: Family Medicine

## 2011-04-14 ENCOUNTER — Other Ambulatory Visit (HOSPITAL_COMMUNITY)
Admission: RE | Admit: 2011-04-14 | Discharge: 2011-04-14 | Disposition: A | Discharge status: Home / Self Care | Payer: Medicaid Other | Source: Ambulatory Visit | Attending: Family Medicine | Admitting: Family Medicine

## 2011-04-14 ENCOUNTER — Encounter: Payer: Self-pay | Admitting: Family Medicine

## 2011-04-14 ENCOUNTER — Ambulatory Visit (INDEPENDENT_AMBULATORY_CARE_PROVIDER_SITE_OTHER): Payer: Medicaid Other | Admitting: Family Medicine

## 2011-04-14 DIAGNOSIS — Z113 Encounter for screening for infections with a predominantly sexual mode of transmission: Secondary | ICD-10-CM | POA: Insufficient documentation

## 2011-04-14 DIAGNOSIS — N72 Inflammatory disease of cervix uteri: Secondary | ICD-10-CM

## 2011-04-14 DIAGNOSIS — N898 Other specified noninflammatory disorders of vagina: Secondary | ICD-10-CM

## 2011-04-14 DIAGNOSIS — N760 Acute vaginitis: Secondary | ICD-10-CM

## 2011-04-14 DIAGNOSIS — B354 Tinea corporis: Secondary | ICD-10-CM

## 2011-04-14 DIAGNOSIS — G43909 Migraine, unspecified, not intractable, without status migrainosus: Secondary | ICD-10-CM

## 2011-04-14 DIAGNOSIS — B9689 Other specified bacterial agents as the cause of diseases classified elsewhere: Secondary | ICD-10-CM

## 2011-04-14 DIAGNOSIS — A499 Bacterial infection, unspecified: Secondary | ICD-10-CM

## 2011-04-14 DIAGNOSIS — R21 Rash and other nonspecific skin eruption: Secondary | ICD-10-CM

## 2011-04-14 LAB — POCT WET PREP (WET MOUNT)

## 2011-04-14 MED ORDER — METRONIDAZOLE 500 MG PO TABS: 500.0000 mg | ORAL_TABLET | Freq: Two times a day (BID) | ORAL | Status: AC

## 2011-04-14 MED ORDER — KETOCONAZOLE 2 % EX CREA: TOPICAL_CREAM | Freq: Every day | CUTANEOUS | Status: DC

## 2011-04-14 MED ORDER — IBUPROFEN 800 MG PO TABS: 800.0000 mg | ORAL_TABLET | Freq: Three times a day (TID) | ORAL | Status: DC | PRN

## 2011-04-14 NOTE — Patient Instructions (Addendum)
Dear Mrs. Krasner,   Thank you for coming to clinic today. Please read below regarding the issues that we discussed.   1. Bacterial Vaginosis - We will treat you with flagyl for 2 weeks. I will call you with the results of the other tests.   2. Headaches - I have refilled the ibuprofen. Please try to take with meals as it can upset people's stomach.   3. Right Arm Lesion - This may represent ring worm. I will send a prescription for this to your pharmacy. If it does not improve in 1 week, the please return.   Please follow up in clinic in 4-6 weeks to discuss BP issues with Dr. Rivka Safer. Please call earlier if you have any questions or concerns.   Sincerely,   Dr. Clinton Sawyer

## 2011-04-21 NOTE — Assessment & Plan Note (Signed)
Given Flagyl 500mg  BID x 14 days. Return if not improving after 2 weeks.

## 2011-04-21 NOTE — Assessment & Plan Note (Addendum)
Rash on right arm appears consistent with tinea, but very broad differential including contact dermatitis, bacterial infection, and insect bite. Try ketoconazole 2% cream daily for 7 days. Return if not improving.

## 2011-04-21 NOTE — Progress Notes (Signed)
  Subjective:    Patient ID: Gail Bass, female    DOB: [birth year], 36 y.o.   MRN: [id number]  HPI [name] presents with 4 concerns today. There are noted below.   1. Vaginal Discharge  Onset: 2 weeks ago Description: started white like yeast, now grey with yellow tinge Odor: yes  Itching: no  Symptoms Dysuria: no  Bleeding: no  Pelvic pain: yes, cramps  Back pain: yes  Fever: no  Genital sores: no  Rash: no  Dyspareunia: no  GI Sxs: no  Prior treatment: yes, tried 2 weeks of monistat cream; notes a history of recurrent bacterial vaginosis  Red Flags: Missed period: no Pregnancy: no  Recent antibiotics: yes, Augmentin in December 2012 for otitis  Sexual activity: yes  Possible STD exposure: not likely, same partner and uses condoms   IUD: no  Diabetes: no    2. Blood pressure - patient concerned about this, but given her other issues it was decided to address this at follow-up  3. Bump on right arm - red, vesicular but now vesicle popped, itches, first noticed 2 days ago, no other bumps or lesions, daughter with recent history of ring worm and pt is concerned this is the same infection, daughter successfully treated  4. Headache - history of migraines   Description of pain: squeezing pain, bilateral in the frontal area. Associated symptoms: none. Patient takes Ibuprofen 800 mg PRN, which is approximately 1 daily   Review of Systems All other systems negative unless stated in HPI.     Objective:   Physical Exam BP 137/90  Pulse 71  Ht 5\' 3"  (1.6 m)  Wt 148 lb (67.132 kg)  BMI 26.22 kg/m2 Gen: alert, oriented, NAD, pleasant HEENT: PERRLA, OP clear Abdomen: soft, non-tender, non-distended, NABS GU: normal external genitalia, scant thin discharge, no cervicitis, tenderness on right side during bimanual exam Neuro: CN III-XII grossly intact     Assessment & Plan:  [name] presents with recurrent bacterial vaginosis, stable primary headache, and  possible tinea corporis of the right arm.

## 2011-06-25 ENCOUNTER — Encounter (HOSPITAL_COMMUNITY): Payer: Self-pay | Admitting: Emergency Medicine

## 2011-06-25 ENCOUNTER — Emergency Department (HOSPITAL_COMMUNITY)
Admission: EM | Admit: 2011-06-25 | Discharge: 2011-06-25 | Disposition: A | Discharge status: Home / Self Care | Payer: Medicaid Other | Source: Home / Self Care | Attending: Emergency Medicine | Admitting: Emergency Medicine

## 2011-06-25 DIAGNOSIS — N390 Urinary tract infection, site not specified: Secondary | ICD-10-CM

## 2011-06-25 DIAGNOSIS — J029 Acute pharyngitis, unspecified: Secondary | ICD-10-CM

## 2011-06-25 LAB — POCT URINALYSIS DIP (DEVICE)
Ketones, ur: NEGATIVE mg/dL
Protein, ur: NEGATIVE mg/dL
Specific Gravity, Urine: >=1.03 (ref 1.005–1.030)
pH: 5.5 (ref 5.0–8.0)

## 2011-06-25 LAB — POCT RAPID STREP A: Streptococcus, Group A Screen (Direct): NEGATIVE

## 2011-06-25 MED ORDER — CEPHALEXIN 500 MG PO CAPS: 500.0000 mg | ORAL_CAPSULE | Freq: Three times a day (TID) | ORAL | Status: AC

## 2011-06-25 MED ORDER — NAPROXEN 500 MG PO TABS: 500.0000 mg | ORAL_TABLET | Freq: Two times a day (BID) | ORAL | Status: DC

## 2011-06-25 MED ORDER — FLUCONAZOLE 150 MG PO TABS: 150.0000 mg | ORAL_TABLET | Freq: Once | ORAL | Status: AC

## 2011-06-25 MED ORDER — PHENAZOPYRIDINE HCL 200 MG PO TABS: 200.0000 mg | ORAL_TABLET | Freq: Three times a day (TID) | ORAL | Status: AC | PRN

## 2011-06-25 NOTE — ED Provider Notes (Signed)
Chief Complaint  Patient presents with  . Dysuria    History of Present Illness:   Gail Bass is a 36 year old female who comes in today with a primary complaint of sore throat. This has been going on for the past 5 days. She has been exposed to strep. She describes severe sore throat, pain with swallowing, headache, and fever. She took ibuprofen right before she came here, so her fever is now normalized. She also has had some nasal congestion but denies rhinorrhea or cough. She's had no abdominal pain, nausea, vomiting, or diarrhea. She did have a history of strep as a child.  Her second complaint is that of dysuria. This when going on off and on for about 2 weeks. She also notes some urinary urgency and dark urine and she's had 2 month history of pain in her lower back. 2 weeks ago she noticed some bright red blood in her urine. She denies any GYN complaints, fever, nausea, or vomiting. She had no prior history of urinary tract infections.  Review of Systems:  Other than noted above, the patient denies any of the following symptoms. Systemic:  No fever, chills, sweats, fatigue, myalgias, headache, or anorexia. Eye:  No redness, pain or drainage. ENT:  No earache, ear congestion, nasal congestion, sneezing, rhinorrhea, sinus pressure, sinus pain, post nasal drip, or sore throat. Lungs:  No cough, sputum production, wheezing, shortness of breath, or chest pain. GI:  No abdominal pain, nausea, vomiting, or diarrhea. Skin:  No rash or itching.  PMFSH:  Past medical history, family history, social history, meds, and allergies were reviewed.  Physical Exam:   Vital signs:  BP 152/107  Pulse 65  Temp(Src) 98.6 F (37 C) (Oral)  Resp 16  SpO2 95%  LMP 05/21/2011 General:  Alert, in no distress. Eye:  No conjunctival injection or drainage. Lids were normal. ENT:  TMs and canals were normal, without erythema or inflammation.  Nasal mucosa was clear and uncongested, without drainage.  Mucous  membranes were moist.  Pharynx was clear, without exudate or drainage.  There were no oral ulcerations or lesions. She did have a polypoid lesion hanging down from her left anterior tonsillar pillar. Neck:  Supple, no adenopathy, tenderness or mass. Lungs:  No respiratory distress.  Lungs were clear to auscultation, without wheezes, rales or rhonchi.  Breath sounds were clear and equal bilaterally. Lungs were resonant to percussion.  No egophony. Heart:  Regular rhythm, without gallops, murmers or rubs. Abdomen: Soft, nontender, no organomegaly or masses, bowel sounds are normal. No CVA tenderness. Skin:  Clear, warm, and dry, without rash or lesions.  Labs:   Results for orders placed during the hospital encounter of 06/25/11  POCT URINALYSIS DIP (DEVICE)      Component Value Range   Glucose, UA NEGATIVE  NEGATIVE (mg/dL)   Bilirubin Urine NEGATIVE  NEGATIVE    Ketones, ur NEGATIVE  NEGATIVE (mg/dL)   Specific Gravity, Urine >=1.030  1.005 - 1.030    Hgb urine dipstick SMALL (*) NEGATIVE    pH 5.5  5.0 - 8.0    Protein, ur NEGATIVE  NEGATIVE (mg/dL)   Urobilinogen, UA 0.2  0.0 - 1.0 (mg/dL)   Nitrite NEGATIVE  NEGATIVE    Leukocytes, UA NEGATIVE  NEGATIVE   POCT PREGNANCY, URINE      Component Value Range   Preg Test, Ur NEGATIVE  NEGATIVE   POCT RAPID STREP A ([facility] ONLY)      Component Value Range  Streptococcus, Group A Screen (Direct) NEGATIVE  NEGATIVE    Other Labs Obtained at Urgent Care Center:  A urine culture was obtained.  Results are pending at this time and we will call about any positive results.  Assessment:  The primary encounter diagnosis was Viral pharyngitis. A diagnosis of UTI (lower urinary tract infection) was also pertinent to this visit.  Plan:   1.  The following meds were prescribed:   New Prescriptions   CEPHALEXIN (KEFLEX) 500 MG CAPSULE    Take 1 capsule (500 mg total) by mouth 3 (three) times daily.   FLUCONAZOLE (DIFLUCAN) 150 MG TABLET     Take 1 tablet (150 mg total) by mouth once.   NAPROXEN (NAPROSYN) 500 MG TABLET    Take 1 tablet (500 mg total) by mouth 2 (two) times daily.   PHENAZOPYRIDINE (PYRIDIUM) 200 MG TABLET    Take 1 tablet (200 mg total) by mouth 3 (three) times daily as needed for pain.   2.  The patient was instructed in symptomatic care and handouts were given. 3.  The patient was told to return if becoming worse in any way, if no better in 3 or 4 days, and given some red flag symptoms that would indicate earlier return.  Follow up:  The patient was told to follow up with her primary care physician in 2 weeks regarding her urinary tract infection and blood in her urine. She was also told to followup with Dr. Alphonzo Grieve regarding the lesion in the throat.  The patient was told that the differential diagnosis includes cancer, and that it was of the utmost importance to followup with the specialist to whom she was referred.     Reuben Likes, MD 06/25/11 2056

## 2011-06-25 NOTE — Discharge Instructions (Signed)
Sore Throat  Sore throats may be caused by bacteria and viruses. They may also be caused by:   Smoking.    Pollution.    Allergies.   If a sore throat is due to strep infection (a bacterial infection), you may need:   A throat swab.    A culture test to verify the strep infection.   You will need one of these:   An antibiotic shot.    Oral medicine for a full 10 days.   Strep infection is very contagious. A doctor should check any close contacts who have a sore throat or fever. A sore throat caused by a virus infection will usually last only 3-4 days. Antibiotics will not treat a viral sore throat.    Infectious mononucleosis (a viral disease), however, can cause a sore throat that lasts for up to 3 weeks. Mononucleosis can be diagnosed with blood tests. You must have been sick for at least 1 week in order for the test to give accurate results.  HOME CARE INSTRUCTIONS     To treat a sore throat, take mild pain medicine.    Increase your fluids.    Eat a soft diet.    Do not smoke.    Gargling with warm water or salt water (1 tsp. salt in 8 oz. water) can be helpful.    Try throat sprays or lozenges or sucking on hard candy to ease the symptoms.   Call your doctor if your sore throat lasts longer than 1 week.   SEEK IMMEDIATE MEDICAL CARE IF:   You have difficulty breathing.    You have increased swelling in the throat.    You have pain so severe that you are unable to swallow fluids or your saliva.    You have a severe headache, a high fever, vomiting, or a red rash.   Document Released: 03/26/2004 Document Revised: 02/05/2011 Document Reviewed: 02/03/2007  [employer] Patient Information 2012 [employer]    Salt Water Gargle  This solution will help make your mouth and throat feel better.  HOME CARE INSTRUCTIONS     Mix 1 teaspoon of salt in 8 ounces of warm water.     Gargle with this solution as much or often as you need or as directed. Swish and gargle gently if you have any sores or wounds in your mouth.    Do not swallow this mixture.   Document Released: 11/21/2003 Document Revised: 02/05/2011 Document Reviewed: 04/13/2008  [employer] Patient Information 2012 [employer]

## 2011-06-25 NOTE — ED Notes (Signed)
Pt c/o lower back pain for several months. She states she has dysuria and urgency. Pt also has had congestion and sinus problems for about 1.5 weeks with severe throat pain. Pt states her MD has told her in the past that she has high blood pressure but she hasn't returned for treatment.

## 2011-06-26 LAB — URINE CULTURE
Colony Count: 15000
Culture  Setup Time: 201304252039

## 2011-06-29 NOTE — ED Notes (Signed)
Urine culture: Multiple bacterial morphotypes present, none predominant.  Treated with Keflex at visit.

## 2011-06-30 ENCOUNTER — Ambulatory Visit (INDEPENDENT_AMBULATORY_CARE_PROVIDER_SITE_OTHER): Payer: Medicaid Other | Admitting: Family Medicine

## 2011-06-30 ENCOUNTER — Encounter: Payer: Self-pay | Admitting: Family Medicine

## 2011-06-30 VITALS — BP 149/98 | HR 74 | Temp 98.4°F | Ht 63.0 in | Wt 146.0 lb

## 2011-06-30 DIAGNOSIS — R030 Elevated blood-pressure reading, without diagnosis of hypertension: Secondary | ICD-10-CM

## 2011-06-30 DIAGNOSIS — J019 Acute sinusitis, unspecified: Secondary | ICD-10-CM

## 2011-06-30 DIAGNOSIS — K1370 Unspecified lesions of oral mucosa: Secondary | ICD-10-CM

## 2011-06-30 MED ORDER — FLUCONAZOLE 150 MG PO TABS: 150.0000 mg | ORAL_TABLET | Freq: Once | ORAL | Status: AC

## 2011-06-30 MED ORDER — AMOXICILLIN-POT CLAVULANATE 875-125 MG PO TABS: 1.0000 | ORAL_TABLET | Freq: Two times a day (BID) | ORAL | Status: AC

## 2011-06-30 NOTE — Assessment & Plan Note (Signed)
History of repeated elevated blood pressures when ill.  Advised due for annual exam- to follow-this up with PCP once not acutely ill or in pain.

## 2011-06-30 NOTE — Assessment & Plan Note (Signed)
congenital skin tag, somewhat inflamed due to URI.  Likely benign.  Patient would like referral to ENT for further evaluation.

## 2011-06-30 NOTE — Assessment & Plan Note (Signed)
Will d/c keflex, start augmentin for sinusitis.  Advised nasal saline irrigation.

## 2011-06-30 NOTE — Progress Notes (Signed)
  Subjective:    Patient ID: Gail Bass, female    DOB: [birth year], 36 y.o.   MRN: [id number]  HPI here to discuss nasal congestion and mass on back of throat  Nasal congestion:  3 weeks, history of sinusitis.  Quit smoking 2 years ago.  Was seen at urgent care, was started on zyrtec, and (keflex for UTI.), had neg strep test.  Feels chills.  No second sickening.  Facial pressure/headache.  Some dyspnea and cough, productive.  Right ear fullness and pain.  Tissue around tonsils: Patient states she has had a skin tag near her left tonsil since childhood.  Became inflamed and sore with this most recent sinusitis.  Doctor at urgent care referred her to ENT due to concern of cancer.  Patient requesting referral.  I have reviewed patient's  PMH, FH, and Social history and Medications as related to this visit. asthma Review of Systems General:  Negative for fever malaise, myalgias.  Psitive for chills HEENT: Positive for conjunctivitis, ear pain or drainage, rhinorrhea, nasal congestion, sore throat Respiratory:  Positive  for cough, sputum, dyspnea Abdomen: Negative for abdominal pain, emesis, diarrhea Skin:  Negative for rash        Objective:   Physical Exam GEN: Alert & Oriented, No acute distress HEENT: [place]/AT. EOMI, PERRLA, no conjunctival injection or scleral icterus.  Bilateral tympanic membranes intact with right sided TM erythema, no effusion.  Nares with edema and rhinorrhea.  Oropharynx is without erythema or exudates.  No anterior or posterior cervical lymphadenopathy.  Near right tonsil, skin tag, mildly erythematou, no exudate. CV:  Regular Rate & Rhythm, no murmur Respiratory:  Normal work of breathing, CTAB         Assessment & Plan:

## 2011-06-30 NOTE — Patient Instructions (Signed)
Stop Keflex Start Augmentin Will refer you to ENT do you can ask him about the skin in the back of your throat Use nasal saline irrigation to help clear out your sinuses  Make follow-up appointment to follow-up your blood pressure

## 2011-07-23 ENCOUNTER — Other Ambulatory Visit: Payer: Self-pay | Admitting: Otolaryngology

## 2011-07-29 ENCOUNTER — Telehealth: Payer: Self-pay | Admitting: Family Medicine

## 2011-07-29 ENCOUNTER — Encounter: Payer: Self-pay | Admitting: Family Medicine

## 2011-07-29 ENCOUNTER — Ambulatory Visit (INDEPENDENT_AMBULATORY_CARE_PROVIDER_SITE_OTHER): Payer: Medicaid Other | Admitting: Family Medicine

## 2011-07-29 VITALS — BP 129/84 | HR 72 | Temp 98.0°F | Ht 63.0 in | Wt 144.2 lb

## 2011-07-29 DIAGNOSIS — G43909 Migraine, unspecified, not intractable, without status migrainosus: Secondary | ICD-10-CM

## 2011-07-29 DIAGNOSIS — Z0000 Encounter for general adult medical examination without abnormal findings: Secondary | ICD-10-CM

## 2011-07-29 DIAGNOSIS — N760 Acute vaginitis: Secondary | ICD-10-CM

## 2011-07-29 DIAGNOSIS — R030 Elevated blood-pressure reading, without diagnosis of hypertension: Secondary | ICD-10-CM

## 2011-07-29 LAB — POCT WET PREP (WET MOUNT)

## 2011-07-29 MED ORDER — IBUPROFEN 800 MG PO TABS: 800.0000 mg | ORAL_TABLET | Freq: Three times a day (TID) | ORAL | Status: DC | PRN

## 2011-07-29 MED ORDER — METRONIDAZOLE 500 MG PO TABS: 500.0000 mg | ORAL_TABLET | Freq: Three times a day (TID) | ORAL | Status: AC

## 2011-07-29 NOTE — Progress Notes (Signed)
Addended by: Jimmy Footman K on: 07/29/2011 11:34 AM   Modules accepted: Orders

## 2011-07-29 NOTE — Patient Instructions (Signed)
It was great to see you today!  Schedule an appointment to see me in one year or sooner as needed.  Im glad you are doing well today.   You will need a PAP every three years, next one in 2015.

## 2011-07-29 NOTE — Progress Notes (Signed)
  Subjective:     Gail Bass is a 36 y.o. female and is here for a comprehensive physical exam. The patient reports problems - vaginal discharge and blood pressure concerns..  History   Social History  . Marital Status: Single    Spouse Name: N/A    Number of Children: N/A  . Years of Education: N/A   Occupational History  . Not on file.   Social History Main Topics  . Smoking status: Former Smoker    Quit date: 09/30/2009  . Smokeless tobacco: Never Used  . Alcohol Use: No  . Drug Use: No  . Sexually Active: Yes -- Female partner(s)    Birth Control/ Protection: Surgical, Condom   Other Topics Concern  . Not on file   Social History Narrative  . No narrative on file   Health Maintenance  Topic Date Due  . Influenza Vaccine  12/01/2011  . Pap Smear  07/06/2013  . Tetanus/tdap  07/06/2020   Review of Systems Pertinent items are noted in HPI.  Objective:    BP 129/84  Pulse 72  Temp(Src) 98 F (36.7 C) (Oral)  Ht 5\' 3"  (1.6 m)  Wt 144 lb 3.2 oz (65.409 kg)  BMI 25.54 kg/m2  LMP 07/28/2011 General appearance: alert and cooperative Head: Normocephalic, without obvious abnormality, atraumatic Eyes: conjunctivae/corneas clear. PERRL, EOM's intact. Fundi benign. Ears: normal TM's and external ear canals both ears Nose: Nares normal. Septum midline. Mucosa normal. No drainage or sinus tenderness. Throat: lips, mucosa, and tongue normal; teeth and gums normal Neck: no adenopathy, no carotid bruit, no JVD, supple, symmetrical, trachea midline and thyroid not enlarged, symmetric, no tenderness/mass/nodules Lungs: clear to auscultation bilaterally Breasts: normal appearance, no masses or tenderness Heart: regular rate and rhythm, S1, S2 normal, no murmur, click, rub or gallop Abdomen: soft, non-tender; bowel sounds normal; no masses,  no organomegaly Pelvic: cervix normal in appearance, external genitalia normal, no adnexal masses or tenderness, no cervical motion  tenderness, rectovaginal septum normal, uterus normal size, shape, and consistency and vagina normal without discharge Extremities: extremities normal, atraumatic, no cyanosis or edema Skin: Skin color, texture, turgor normal. No rashes or lesions Neurologic: Grossly normal    Assessment:    Healthy female exam.  Blood pressure in target range today.  Obtained wet prep for possible BV     Plan:     See After Visit Summary for Counseling Recommendations

## 2011-07-29 NOTE — Assessment & Plan Note (Signed)
BP Readings from Last 3 Encounters:  07/29/11 129/84  06/30/11 149/98  06/25/11 152/107  Doing well on BP today without blood pressure medication. Advised on healthy low salt diet <1500 mg F/u in one year.

## 2011-07-29 NOTE — Telephone Encounter (Signed)
Sent in medication for BV. Called patient and notified her.

## 2011-08-17 ENCOUNTER — Other Ambulatory Visit (HOSPITAL_COMMUNITY)
Admission: RE | Admit: 2011-08-17 | Discharge: 2011-08-17 | Disposition: A | Discharge status: Home / Self Care | Payer: Medicaid Other | Source: Ambulatory Visit | Attending: Family Medicine | Admitting: Family Medicine

## 2011-08-17 ENCOUNTER — Encounter: Payer: Self-pay | Admitting: Family Medicine

## 2011-08-17 ENCOUNTER — Ambulatory Visit (INDEPENDENT_AMBULATORY_CARE_PROVIDER_SITE_OTHER): Payer: Medicaid Other | Admitting: Family Medicine

## 2011-08-17 VITALS — BP 126/80 | HR 71 | Temp 97.9°F | Wt 144.6 lb

## 2011-08-17 DIAGNOSIS — Z113 Encounter for screening for infections with a predominantly sexual mode of transmission: Secondary | ICD-10-CM | POA: Insufficient documentation

## 2011-08-17 DIAGNOSIS — Z2089 Contact with and (suspected) exposure to other communicable diseases: Secondary | ICD-10-CM

## 2011-08-17 DIAGNOSIS — Z01419 Encounter for gynecological examination (general) (routine) without abnormal findings: Secondary | ICD-10-CM | POA: Insufficient documentation

## 2011-08-17 DIAGNOSIS — N760 Acute vaginitis: Secondary | ICD-10-CM

## 2011-08-17 DIAGNOSIS — Z202 Contact with and (suspected) exposure to infections with a predominantly sexual mode of transmission: Secondary | ICD-10-CM | POA: Insufficient documentation

## 2011-08-17 DIAGNOSIS — Z124 Encounter for screening for malignant neoplasm of cervix: Secondary | ICD-10-CM | POA: Insufficient documentation

## 2011-08-17 LAB — HIV ANTIBODY (ROUTINE TESTING W REFLEX): HIV: NONREACTIVE

## 2011-08-17 LAB — POCT WET PREP (WET MOUNT)

## 2011-08-17 NOTE — Progress Notes (Signed)
  Subjective:   Patient ID: Gail Bass, female DOB: Mar 02, 1976 36 y.o. MRN: [id number] HPI:  1. Screening for cervical cancer Synopsis: patient due for PAP smear. No bleeding, minimal white discharge.  Location: cervix  2. Exposure to individual with possible STD Synopsis: unprotected sex with partner that may not be monogamous. She has white cervical discharge. No abd. Pain, no bleeding, no weight loss. No lymph nodes.  She has tubes tied, no risk for preg.   History  Substance Use Topics  . Smoking status: Former Smoker    Quit date: 09/30/2009  . Smokeless tobacco: Never Used  . Alcohol Use: No    Review of Systems: Pertinent items are noted in HPI.  Labs Reviewed: yes Reviewed Chart Review for last notes.     Objective:   Filed Vitals:   08/17/11 1042  BP: 126/80  Pulse: 71  Temp: 97.9 F (36.6 C)  TempSrc: Oral  Weight: 144 lb 9.6 oz (65.59 kg)   Physical Exam: General: c/f, nad, pleasant Cervix: normal appearing, no CMT, not friable. White discharge noted in vaginal vault. No erythema, no purulence. No external lesions.  Lymph nodes groin: wnl.  Assessment & Plan:

## 2011-08-17 NOTE — Assessment & Plan Note (Signed)
Wet prep done today.  GC/CL/HIV

## 2011-08-17 NOTE — Assessment & Plan Note (Signed)
PAP done today

## 2011-08-17 NOTE — Patient Instructions (Signed)
It was great to see you today!  Schedule an appointment to see me as needed.  l will mail you your lab test results and call you if they are abnormal.

## 2011-08-17 NOTE — Assessment & Plan Note (Signed)
Wet prep done today.  GC/CL/HIV 

## 2011-08-19 ENCOUNTER — Encounter: Payer: Self-pay | Admitting: Family Medicine

## 2011-11-28 ENCOUNTER — Encounter (HOSPITAL_COMMUNITY): Payer: Self-pay

## 2011-11-28 ENCOUNTER — Emergency Department (HOSPITAL_COMMUNITY)
Admission: EM | Admit: 2011-11-28 | Discharge: 2011-11-28 | Disposition: A | Discharge status: Home / Self Care | Payer: Medicaid Other | Source: Home / Self Care | Attending: Family Medicine | Admitting: Family Medicine

## 2011-11-28 DIAGNOSIS — N83201 Unspecified ovarian cyst, right side: Secondary | ICD-10-CM

## 2011-11-28 DIAGNOSIS — N83209 Unspecified ovarian cyst, unspecified side: Secondary | ICD-10-CM

## 2011-11-28 DIAGNOSIS — A499 Bacterial infection, unspecified: Secondary | ICD-10-CM

## 2011-11-28 DIAGNOSIS — N760 Acute vaginitis: Secondary | ICD-10-CM

## 2011-11-28 LAB — POCT URINALYSIS DIP (DEVICE)
Bilirubin Urine: NEGATIVE
Glucose, UA: NEGATIVE mg/dL
Specific Gravity, Urine: 1.015 (ref 1.005–1.030)

## 2011-11-28 LAB — POCT PREGNANCY, URINE: Preg Test, Ur: NEGATIVE

## 2011-11-28 LAB — WET PREP, GENITAL

## 2011-11-28 MED ORDER — METRONIDAZOLE 250 MG PO TABS: 250.0000 mg | ORAL_TABLET | Freq: Three times a day (TID) | ORAL | Status: DC

## 2011-11-28 NOTE — ED Notes (Signed)
States her partner came home w a hickey on his neck 2 months ago and ever since then, she has had a foul smelling vaginal d/c that does not respond to OTC treatment.

## 2011-11-28 NOTE — ED Provider Notes (Signed)
History     CSN: [id number]  Arrival date & time 11/28/11  1322   First MD Initiated Contact with Patient 11/28/11 1331      Chief Complaint  Patient presents with  . Vaginal Discharge    (Consider location/radiation/quality/duration/timing/severity/associated sxs/prior treatment) Patient is a 36 y.o. female presenting with vaginal discharge. The history is provided by the patient.  Vaginal Discharge This is a new problem. The current episode started more than 1 week ago (2 mo h/o d/c which has developed odor recently  and right pelvic pains.). The problem has been gradually worsening. Associated symptoms include abdominal pain. Pertinent negatives include no chest pain.    Past Medical History  Diagnosis Date  . TUBAL PREGNANCY 09/13/2008    Qualifier: History of  By: Lanier Prude  MD, Cathrine Muster      History reviewed. No pertinent past surgical history.  History reviewed. No pertinent family history.  History  Substance Use Topics  . Smoking status: Former Smoker    Quit date: 09/30/2009  . Smokeless tobacco: Never Used  . Alcohol Use: No    OB History    Grav Para Term Preterm Abortions TAB SAB Ect Mult Living                  Review of Systems  Constitutional: Negative.   Cardiovascular: Negative for chest pain.  Gastrointestinal: Positive for abdominal pain.  Genitourinary: Positive for vaginal discharge and pelvic pain. Negative for dysuria, urgency, frequency, hematuria, vaginal bleeding, vaginal pain and menstrual problem.    Allergies  Doxycycline and Lorcet  Home Medications   Current Outpatient Rx  Name Route Sig Dispense Refill  . ALBUTEROL SULFATE HFA 108 (90 BASE) MCG/ACT IN AERS Inhalation Inhale 2 puffs into the lungs as needed.      Marland Kitchen CETIRIZINE HCL 10 MG PO TABS Oral Take 1 tablet (10 mg total) by mouth daily. 30 tablet 11  . IBUPROFEN 800 MG PO TABS Oral Take 1 tablet (800 mg total) by mouth every 8 (eight) hours as needed for pain. 1 tab by  mouth as needed for pain 30 tablet 3  . METRONIDAZOLE 250 MG PO TABS Oral Take 1 tablet (250 mg total) by mouth 3 (three) times daily. 21 tablet 0    BP 134/80  Pulse 77  Temp 98.8 F (37.1 C) (Oral)  Resp 18  SpO2 98%  LMP 10/23/2011  Physical Exam  Nursing note and vitals reviewed. Constitutional: She appears well-developed and well-nourished.  Genitourinary: Uterus normal. Cervix exhibits no motion tenderness, no discharge and no friability. Right adnexum displays tenderness. Right adnexum displays no mass and no fullness. Left adnexum displays no mass, no tenderness and no fullness. Vaginal discharge found.    ED Course  Procedures (including critical care time)  Labs Reviewed  POCT URINALYSIS DIP (DEVICE) - Abnormal; Notable for the following:    Hgb urine dipstick TRACE (*)     All other components within normal limits  POCT PREGNANCY, URINE  GC/CHLAMYDIA PROBE AMP, GENITAL  WET PREP, GENITAL   No results found.   1. Bacterial vaginitis   2. Ovarian cyst, right       MDM          Linna Hoff, MD 11/28/11 [phone]

## 2011-12-01 ENCOUNTER — Encounter: Payer: Self-pay | Admitting: Family Medicine

## 2011-12-02 NOTE — ED Notes (Signed)
GC/chlamydia neg., Wet prep: many clue cells, rare WBC's.  Pt. adequately treated with Flagyl. Vassie Moselle 12/02/2011

## 2012-03-19 ENCOUNTER — Emergency Department (HOSPITAL_COMMUNITY)
Admission: EM | Admit: 2012-03-19 | Discharge: 2012-03-19 | Disposition: A | Discharge status: Home / Self Care | Payer: Medicaid Other | Source: Home / Self Care

## 2012-03-19 ENCOUNTER — Encounter (HOSPITAL_COMMUNITY): Payer: Self-pay | Admitting: Emergency Medicine

## 2012-03-19 DIAGNOSIS — H6691 Otitis media, unspecified, right ear: Secondary | ICD-10-CM

## 2012-03-19 DIAGNOSIS — J029 Acute pharyngitis, unspecified: Secondary | ICD-10-CM

## 2012-03-19 DIAGNOSIS — H6690 Otitis media, unspecified, unspecified ear: Secondary | ICD-10-CM

## 2012-03-19 LAB — POCT RAPID STREP A: Streptococcus, Group A Screen (Direct): NEGATIVE

## 2012-03-19 MED ORDER — ACETAMINOPHEN-CODEINE #3 300-30 MG PO TABS: 1.0000 | ORAL_TABLET | ORAL | Status: DC | PRN

## 2012-03-19 MED ORDER — FLUCONAZOLE 200 MG PO TABS: 200.0000 mg | ORAL_TABLET | Freq: Every day | ORAL | Status: AC

## 2012-03-19 MED ORDER — AMOXICILLIN 500 MG PO CAPS: 1000.0000 mg | ORAL_CAPSULE | Freq: Three times a day (TID) | ORAL | Status: DC

## 2012-03-19 MED ORDER — ANTIPYRINE-BENZOCAINE 5.4-1.4 % OT SOLN
3.0000 [drp] | Freq: Once | OTIC | Status: AC
  Administered 2012-03-19: 4 [drp] via OTIC

## 2012-03-19 NOTE — ED Provider Notes (Signed)
History     CSN: [id number]  Arrival date & time 03/19/12  1142   None     Chief Complaint  Patient presents with  . Sore Throat    HPI: Patient is a 37 y.o. female presenting with pharyngitis. The history is provided by the patient.  Sore Throat This is a new problem. The current episode started more than 2 days ago. The problem occurs constantly. The problem has been gradually worsening. Pertinent negatives include no shortness of breath. The symptoms are aggravated by swallowing. Nothing relieves the symptoms.  Pt reports 4 day h/o sore throat and (R) ear pain. Concerned because her daughter was recently dx'd w/ strep throat. States (R) ear pain radiates into her (R) neck and (R) side of her head. Other sx's include low grade fever. Denies cough, body aches or other sx's.  Past Medical History  Diagnosis Date  . TUBAL PREGNANCY 09/13/2008    Qualifier: History of  By: Lanier Prude  MD, Cathrine Muster      Past Surgical History  Procedure Date  . Cystectomy     No family history on file.  History  Substance Use Topics  . Smoking status: Former Smoker    Quit date: 09/30/2009  . Smokeless tobacco: Never Used  . Alcohol Use: No    OB History    Grav Para Term Preterm Abortions TAB SAB Ect Mult Living                  Review of Systems  Constitutional: Negative.   HENT: Positive for ear pain, congestion, rhinorrhea, neck pain and tinnitus. Negative for facial swelling, neck stiffness and ear discharge.   Eyes: Negative.   Respiratory: Negative for cough and shortness of breath.   Cardiovascular: Negative.   Gastrointestinal: Negative.   Genitourinary: Negative.   Neurological: Negative.   Hematological: Negative.   Psychiatric/Behavioral: Negative.     Allergies  Doxycycline and Lorcet  Home Medications   Current Outpatient Rx  Name  Route  Sig  Dispense  Refill  . ALBUTEROL SULFATE HFA 108 (90 BASE) MCG/ACT IN AERS   Inhalation   Inhale 2 puffs into the lungs  as needed.           Marland Kitchen CETIRIZINE HCL 10 MG PO TABS   Oral   Take 1 tablet (10 mg total) by mouth daily.   30 tablet   11   . IBUPROFEN 800 MG PO TABS   Oral   Take 1 tablet (800 mg total) by mouth every 8 (eight) hours as needed for pain. 1 tab by mouth as needed for pain   30 tablet   3   . METRONIDAZOLE 250 MG PO TABS   Oral   Take 1 tablet (250 mg total) by mouth 3 (three) times daily.   21 tablet   0     BP 143/86  Pulse 65  Temp 98.7 F (37.1 C) (Oral)  Resp 16  SpO2 96%  LMP 02/27/2012  Physical Exam  Constitutional: She is oriented to person, place, and time. She appears well-developed and well-nourished.  HENT:  Head: Normocephalic and atraumatic.  Right Ear: Ear canal normal. There is tenderness. No drainage. Tympanic membrane is erythematous and bulging.  Left Ear: Tympanic membrane and external ear normal.  Nose: Nose normal. Right sinus exhibits no maxillary sinus tenderness and no frontal sinus tenderness. Left sinus exhibits no maxillary sinus tenderness and no frontal sinus tenderness.  Mouth/Throat: Uvula is midline and mucous  membranes are normal. Posterior oropharyngeal erythema present. No oropharyngeal exudate, posterior oropharyngeal edema or tonsillar abscesses.  Eyes: Conjunctivae normal are normal.  Neck: Neck supple.  Cardiovascular: Normal rate and regular rhythm.   Pulmonary/Chest: Effort normal and breath sounds normal.  Musculoskeletal: Normal range of motion.  Neurological: She is alert and oriented to person, place, and time.  Skin: Skin is warm and dry.  Psychiatric: She has a normal mood and affect.    ED Course  Procedures    Labs Reviewed  POCT RAPID STREP A ([facility] ONLY)   No results found.   No diagnosis found.    MDM  4 day h/o sore throat and (R) ear pain. Rapid strep neg. PE reveals mild pharyngeal erythema, no edema or exudate. (R) ear remarkable for slightly bulging TM w/ purulent exudate behind TM. Will  treat w/ Amoxicillin and medication for pain.         Leanne Chang, NP 03/19/12 1559  Roma Kayser Laiana Fratus, NP 03/19/12 1600

## 2012-03-19 NOTE — ED Notes (Signed)
Pt c/o sore throat x4 days.  Sx include: fevers, right ear pain, headaches, dysphagia Denies: vomiting, nauseas, diarrhea Daughter was dx w/strep last week Has been taking dayquil, nyquil, and ibuprofen 800mg    She is alert w/no signs of acute distress.

## 2012-03-19 NOTE — ED Provider Notes (Signed)
Medical screening examination/treatment/procedure(s) were performed by resident physician or non-physician practitioner and as supervising physician I was immediately available for consultation/collaboration.   KINDL,JAMES DOUGLAS MD.    James D Kindl, MD 03/19/12 1748 

## 2012-04-26 ENCOUNTER — Emergency Department (HOSPITAL_COMMUNITY)
Admission: EM | Admit: 2012-04-26 | Discharge: 2012-04-26 | Disposition: A | Discharge status: Home / Self Care | Payer: Medicaid Other | Attending: Emergency Medicine | Admitting: Emergency Medicine

## 2012-04-26 ENCOUNTER — Encounter (HOSPITAL_COMMUNITY): Payer: Self-pay | Admitting: *Deleted

## 2012-04-26 DIAGNOSIS — B9689 Other specified bacterial agents as the cause of diseases classified elsewhere: Secondary | ICD-10-CM

## 2012-04-26 DIAGNOSIS — Z87891 Personal history of nicotine dependence: Secondary | ICD-10-CM | POA: Insufficient documentation

## 2012-04-26 DIAGNOSIS — N898 Other specified noninflammatory disorders of vagina: Secondary | ICD-10-CM | POA: Insufficient documentation

## 2012-04-26 DIAGNOSIS — N760 Acute vaginitis: Secondary | ICD-10-CM | POA: Insufficient documentation

## 2012-04-26 DIAGNOSIS — R109 Unspecified abdominal pain: Secondary | ICD-10-CM | POA: Insufficient documentation

## 2012-04-26 DIAGNOSIS — N949 Unspecified condition associated with female genital organs and menstrual cycle: Secondary | ICD-10-CM | POA: Insufficient documentation

## 2012-04-26 DIAGNOSIS — Z3202 Encounter for pregnancy test, result negative: Secondary | ICD-10-CM | POA: Insufficient documentation

## 2012-04-26 LAB — WET PREP, GENITAL

## 2012-04-26 LAB — CBC WITH DIFFERENTIAL/PLATELET
Basophils Absolute: 0 10*3/uL (ref 0.0–0.1)
Eosinophils Absolute: 0.2 10*3/uL (ref 0.0–0.7)
Eosinophils Relative: 2 % (ref 0–5)
Lymphs Abs: 2.4 10*3/uL (ref 0.7–4.0)
MCH: 32.9 pg (ref 26.0–34.0)
MCV: 92.4 fL (ref 78.0–100.0)
Neutrophils Relative %: 59 % (ref 43–77)
Platelets: 239 10*3/uL (ref 150–400)
RBC: 4.22 MIL/uL (ref 3.87–5.11)
RDW: 12 % (ref 11.5–15.5)
WBC: 7.6 10*3/uL (ref 4.0–10.5)

## 2012-04-26 LAB — COMPREHENSIVE METABOLIC PANEL
ALT: 13 U/L (ref 0–35)
AST: 15 U/L (ref 0–37)
Albumin: 3.9 g/dL (ref 3.5–5.2)
Alkaline Phosphatase: 65 U/L (ref 39–117)
Calcium: 9.1 mg/dL (ref 8.4–10.5)
GFR calc Af Amer: >90 mL/min (ref >90)
Potassium: 3.8 mEq/L (ref 3.5–5.1)
Sodium: 139 mEq/L (ref 135–145)
Total Protein: 7.4 g/dL (ref 6.0–8.3)

## 2012-04-26 LAB — URINALYSIS, ROUTINE W REFLEX MICROSCOPIC
Bilirubin Urine: NEGATIVE
Ketones, ur: NEGATIVE mg/dL
Nitrite: NEGATIVE
Specific Gravity, Urine: 1.022 (ref 1.005–1.030)
Urobilinogen, UA: 0.2 mg/dL (ref 0.0–1.0)
pH: 7.5 (ref 5.0–8.0)

## 2012-04-26 LAB — URINE MICROSCOPIC-ADD ON

## 2012-04-26 MED ORDER — LIDOCAINE HCL (PF) 1 % IJ SOLN
INTRAMUSCULAR | Status: AC
  Administered 2012-04-26: 5 mL
  Filled 2012-04-26: qty 5

## 2012-04-26 MED ORDER — CEFTRIAXONE SODIUM 250 MG IJ SOLR
250.0000 mg | Freq: Once | INTRAMUSCULAR | Status: AC
  Administered 2012-04-26: 250 mg via INTRAMUSCULAR
  Filled 2012-04-26: qty 250

## 2012-04-26 MED ORDER — METRONIDAZOLE 500 MG PO TABS: 500.0000 mg | ORAL_TABLET | Freq: Two times a day (BID) | ORAL | Status: DC

## 2012-04-26 MED ORDER — AZITHROMYCIN 250 MG PO TABS
1000.0000 mg | ORAL_TABLET | Freq: Once | ORAL | Status: AC
  Administered 2012-04-26: 1000 mg via ORAL
  Filled 2012-04-26: qty 4

## 2012-04-26 NOTE — ED Provider Notes (Signed)
History    This chart was scribed for non-physician practitioner working with Gail Chick, MD by Gerlean Ren, ED Scribe. This patient was seen in room TR11C/TR11C and the patient's care was started at 10:45 PM.    CSN: [id number]  Arrival date & time 04/26/12  1914   First MD Initiated Contact with Patient 04/26/12 2215      Chief Complaint  Patient presents with  . vaginal itching      The history is provided by the patient. No language interpreter was used.  Gail Bass is a 37 y.o. female who presents to the Emergency Department complaining of one week of constant vaginal itching and vaginal tenderness with associated two days of vaginal discharge and one day of cramping abdominal pain.  Pt reports last sexual intercourse before onset of symptoms and states that the condom came off inside her vagina and she is unsure how long the condom remained there before she removed it.  No severe pain during sexual intercourse.  Only one partner at this time.  LNMP ended 4 days ago with no abnormalities other than vaginal tenderness to tampon, which pt reports has never happened to her before.  Pt denies fever, chills, nausea, emesis, diarrhea.   Pt reports ear infection one month ago and was given amoxicillin which she took as prescribed.     Past Medical History  Diagnosis Date  . TUBAL PREGNANCY 09/13/2008    Qualifier: History of  By: Lanier Prude  MD, Cathrine Muster      Past Surgical History  Procedure Laterality Date  . Cystectomy      No family history on file.  History  Substance Use Topics  . Smoking status: Former Smoker    Quit date: 09/30/2009  . Smokeless tobacco: Never Used  . Alcohol Use: No      Review of Systems  Constitutional: Negative for fever and chills.  Gastrointestinal: Positive for abdominal pain. Negative for nausea, vomiting and diarrhea.  Genitourinary: Positive for vaginal discharge and vaginal pain.       Vaginal itching  All other systems  reviewed and are negative.    Allergies  Doxycycline and Lorcet  Home Medications   Current Outpatient Rx  Name  Route  Sig  Dispense  Refill  . ibuprofen (ADVIL,MOTRIN) 800 MG tablet   Oral   Take 1 tablet (800 mg total) by mouth every 8 (eight) hours as needed for pain. 1 tab by mouth as needed for pain   30 tablet   3   . EXPIRED: cetirizine (ZYRTEC) 10 MG tablet   Oral   Take 1 tablet (10 mg total) by mouth daily.   30 tablet   11     BP 131/77  Pulse 64  Temp(Src) 98.3 F (36.8 C) (Oral)  Resp 18  SpO2 97%  LMP 04/12/2012  Physical Exam  Nursing note and vitals reviewed. Constitutional: She is oriented to person, place, and time. She appears well-developed and well-nourished. No distress.  HENT:  Head: Normocephalic and atraumatic.  Eyes: EOM are normal.  Neck: Neck supple. No tracheal deviation present.  Cardiovascular: Normal rate, regular rhythm and normal heart sounds.   Pulmonary/Chest: Effort normal. No respiratory distress. She has no wheezes. She has no rales.  Abdominal: Soft. There is tenderness.  Mild suprapubic tenderness, no McBurney point  Genitourinary:  Chaperone present.  Normal external genitalia, vaginal vault with mild functional discharge without rash or abnormal exudates.  Cervical os with normal appearance.  On my manual exam pt has some right adnexa tenderness and some cervical motion tenderness  Musculoskeletal: Normal range of motion.  Neurological: She is alert and oriented to person, place, and time.  Skin: Skin is warm and dry.  Psychiatric: She has a normal mood and affect. Her behavior is normal.    ED Course  Procedures (including critical care time) DIAGNOSTIC STUDIES: Oxygen Saturation is 97% on room air, adequate by my interpretation.    COORDINATION OF CARE: 10:49 PM- Informed pt of clinical course including pelvic exam.  Pt understands and agrees with plan. 10:50 PM- Pelvic exam performed and pt has some evidence of  cervical motion tenderness concerning for STD.  Will preemptively treat with Rocephin/Zithromax.  Doubt ovarian torsion, tubo ovarian abscess.  Results for orders placed during the hospital encounter of 04/26/12  URINALYSIS, ROUTINE W REFLEX MICROSCOPIC      Result Value Range   Color, Urine YELLOW  YELLOW   APPearance HAZY (*) CLEAR   Specific Gravity, Urine 1.022  1.005 - 1.030   pH 7.5  5.0 - 8.0   Glucose, UA NEGATIVE  NEGATIVE mg/dL   Hgb urine dipstick NEGATIVE  NEGATIVE   Bilirubin Urine NEGATIVE  NEGATIVE   Ketones, ur NEGATIVE  NEGATIVE mg/dL   Protein, ur NEGATIVE  NEGATIVE mg/dL   Urobilinogen, UA 0.2  0.0 - 1.0 mg/dL   Nitrite NEGATIVE  NEGATIVE   Leukocytes, UA SMALL (*) NEGATIVE  PREGNANCY, URINE      Result Value Range   Preg Test, Ur NEGATIVE  NEGATIVE  CBC WITH DIFFERENTIAL      Result Value Range   WBC 7.6  4.0 - 10.5 K/uL   RBC 4.22  3.87 - 5.11 MIL/uL   Hemoglobin 13.9  12.0 - 15.0 g/dL   HCT 16.1  09.6 - 04.5 %   MCV 92.4  78.0 - 100.0 fL   MCH 32.9  26.0 - 34.0 pg   MCHC 35.6  30.0 - 36.0 g/dL   RDW 40.9  81.1 - 91.4 %   Platelets 239  150 - 400 K/uL   Neutrophils Relative 59  43 - 77 %   Neutro Abs 4.5  1.7 - 7.7 K/uL   Lymphocytes Relative 32  12 - 46 %   Lymphs Abs 2.4  0.7 - 4.0 K/uL   Monocytes Relative 7  3 - 12 %   Monocytes Absolute 0.5  0.1 - 1.0 K/uL   Eosinophils Relative 2  0 - 5 %   Eosinophils Absolute 0.2  0.0 - 0.7 K/uL   Basophils Relative 0  0 - 1 %   Basophils Absolute 0.0  0.0 - 0.1 K/uL  COMPREHENSIVE METABOLIC PANEL      Result Value Range   Sodium 139  135 - 145 mEq/L   Potassium 3.8  3.5 - 5.1 mEq/L   Chloride 103  96 - 112 mEq/L   CO2 30  19 - 32 mEq/L   Glucose, Bld 93  70 - 99 mg/dL   BUN 12  6 - 23 mg/dL   Creatinine, Ser 7.82  0.50 - 1.10 mg/dL   Calcium 9.1  8.4 - 95.6 mg/dL   Total Protein 7.4  6.0 - 8.3 g/dL   Albumin 3.9  3.5 - 5.2 g/dL   AST 15  0 - 37 U/L   ALT 13  0 - 35 U/L   Alkaline Phosphatase 65  39  - 117 U/L   Total Bilirubin 0.3  0.3 - 1.2 mg/dL   GFR calc non Af Amer >90  >90 mL/min   GFR calc Af Amer >90  >90 mL/min  URINE MICROSCOPIC-ADD ON      Result Value Range   Squamous Epithelial / LPF RARE  RARE   WBC, UA 3-6  <3 WBC/hpf   RBC / HPF 0-2  <3 RBC/hpf   Bacteria, UA FEW (*) RARE   Urine-Other AMORPHOUS URATES/PHOSPHATES      No results found.   No diagnosis found.  11:37 PM Wet prep with evidence of BV.  Will treat.  Otherwise, pt stable for discharge.  Pt aware not to drink alcohol when taking flagyl.    BP 131/77  Pulse 64  Temp(Src) 98.3 F (36.8 C) (Oral)  Resp 18  SpO2 97%  LMP 04/12/2012  I have reviewed nursing notes and vital signs.  I reviewed available ER/hospitalization records thought the EMR  1. BV MDM    I personally performed the services described in this documentation, which was scribed in my presence. The recorded information has been reviewed and is accurate.         Fayrene Helper, PA-C 04/26/12 2338

## 2012-04-26 NOTE — ED Notes (Signed)
The pt is also c/o abd pain 

## 2012-04-26 NOTE — ED Provider Notes (Signed)
Medical screening examination/treatment/procedure(s) were performed by non-physician practitioner and as supervising physician I was immediately available for consultation/collaboration.  Martha K Linker, MD 04/26/12 2349 

## 2012-04-26 NOTE — ED Notes (Signed)
The pt is c/o a vaginal discharge and vaginal itching for 7 days.  lmp last week

## 2012-04-27 LAB — URINE CULTURE

## 2012-04-27 LAB — GC/CHLAMYDIA PROBE AMP: CT Probe RNA: NEGATIVE

## 2012-08-12 ENCOUNTER — Emergency Department (HOSPITAL_COMMUNITY)
Admission: EM | Admit: 2012-08-12 | Discharge: 2012-08-12 | Disposition: A | Discharge status: Home / Self Care | Payer: Medicaid Other | Source: Home / Self Care

## 2012-08-12 ENCOUNTER — Other Ambulatory Visit (HOSPITAL_COMMUNITY)
Admission: RE | Admit: 2012-08-12 | Discharge: 2012-08-12 | Disposition: A | Discharge status: Home / Self Care | Payer: Medicaid Other | Source: Ambulatory Visit | Attending: Emergency Medicine | Admitting: Emergency Medicine

## 2012-08-12 ENCOUNTER — Encounter (HOSPITAL_COMMUNITY): Payer: Self-pay | Admitting: Emergency Medicine

## 2012-08-12 DIAGNOSIS — N730 Acute parametritis and pelvic cellulitis: Secondary | ICD-10-CM

## 2012-08-12 DIAGNOSIS — N760 Acute vaginitis: Secondary | ICD-10-CM | POA: Insufficient documentation

## 2012-08-12 DIAGNOSIS — B9689 Other specified bacterial agents as the cause of diseases classified elsewhere: Secondary | ICD-10-CM

## 2012-08-12 DIAGNOSIS — A499 Bacterial infection, unspecified: Secondary | ICD-10-CM

## 2012-08-12 DIAGNOSIS — R102 Pelvic and perineal pain unspecified side: Secondary | ICD-10-CM

## 2012-08-12 DIAGNOSIS — Z113 Encounter for screening for infections with a predominantly sexual mode of transmission: Secondary | ICD-10-CM | POA: Insufficient documentation

## 2012-08-12 DIAGNOSIS — N949 Unspecified condition associated with female genital organs and menstrual cycle: Secondary | ICD-10-CM

## 2012-08-12 DIAGNOSIS — N898 Other specified noninflammatory disorders of vagina: Secondary | ICD-10-CM

## 2012-08-12 HISTORY — DX: Unspecified asthma, uncomplicated: J45.909

## 2012-08-12 HISTORY — DX: Other specified bacterial agents as the cause of diseases classified elsewhere: N76.0

## 2012-08-12 HISTORY — DX: Acute vaginitis: B96.89

## 2012-08-12 HISTORY — DX: Other specified bacterial agents as the cause of diseases classified elsewhere: B96.89

## 2012-08-12 LAB — POCT URINALYSIS DIP (DEVICE)
Bilirubin Urine: NEGATIVE
Ketones, ur: NEGATIVE mg/dL
Leukocytes, UA: NEGATIVE
pH: 7 (ref 5.0–8.0)

## 2012-08-12 LAB — POCT PREGNANCY, URINE: Preg Test, Ur: NEGATIVE

## 2012-08-12 MED ORDER — AZITHROMYCIN 250 MG PO TABS
1000.0000 mg | ORAL_TABLET | Freq: Every day | ORAL | Status: DC
  Administered 2012-08-12: 1000 mg via ORAL

## 2012-08-12 MED ORDER — CEFTRIAXONE SODIUM 250 MG IJ SOLR
INTRAMUSCULAR | Status: AC
  Filled 2012-08-12: qty 250

## 2012-08-12 MED ORDER — METRONIDAZOLE 500 MG PO TABS: 500.0000 mg | ORAL_TABLET | Freq: Two times a day (BID) | ORAL | Status: DC

## 2012-08-12 MED ORDER — LIDOCAINE HCL (PF) 1 % IJ SOLN
INTRAMUSCULAR | Status: AC
  Filled 2012-08-12: qty 5

## 2012-08-12 MED ORDER — CEFTRIAXONE SODIUM 250 MG IJ SOLR
250.0000 mg | Freq: Once | INTRAMUSCULAR | Status: AC
  Administered 2012-08-12: 250 mg via INTRAMUSCULAR

## 2012-08-12 MED ORDER — AZITHROMYCIN 250 MG PO TABS
ORAL_TABLET | ORAL | Status: AC
  Filled 2012-08-12: qty 4

## 2012-08-12 NOTE — ED Notes (Signed)
Patient requesting std check.  Reports most recent partner is having std evaluation and this patient decided to do the same.  Patient reports frequent bv, intermittent vaginal discharge and odor.  Patient reports low abdominal cramping.  Also has headache, back pain and nausea

## 2012-08-12 NOTE — ED Provider Notes (Signed)
Medical screening examination/treatment/procedure(s) were performed by non-physician practitioner and as supervising physician I was immediately available for consultation/collaboration.  Leslee Home, M.D.  Reuben Likes, MD 08/12/12 2030

## 2012-08-12 NOTE — ED Provider Notes (Signed)
History     CSN: [id number]  Arrival date & time 08/12/12  1732   None     Chief Complaint  Patient presents with  . Exposure to STD    (Consider location/radiation/quality/duration/timing/severity/associated sxs/prior treatment) HPI Comments: 37 year old female presents with pelvic pain and vaginal discharge that comes and goes. She recently had sex, about 10 days ago, after which she had tenderness in the vulva. She states that her belly pain began a couple days ago but was much worse yesterday. Even when riding a bus the pelvis would hurt when hitting a bump in the road.  She describes plethora of sexual activities that includes, vaginal, anal, oral sexual activity and the use of "toys" History of BV and trichomonas.   Past Medical History  Diagnosis Date  . TUBAL PREGNANCY 09/13/2008    Qualifier: History of  By: Lanier Prude  MD, Cathrine Muster    . Bacterial vaginosis   . Asthma     Past Surgical History  Procedure Laterality Date  . Cystectomy    . Tailbone cyst      No family history on file.  History  Substance Use Topics  . Smoking status: Former Smoker    Quit date: 09/30/2009  . Smokeless tobacco: Never Used  . Alcohol Use: No    OB History   Grav Para Term Preterm Abortions TAB SAB Ect Mult Living                  Review of Systems  Constitutional: Negative for fever.       Last couple of days feeling malaise.  HENT: Negative.   Respiratory: Negative.   Cardiovascular: Negative.   Genitourinary: Positive for vaginal discharge, vaginal pain and pelvic pain. Negative for dysuria, frequency and flank pain.  Hematological: Negative.     Allergies  Doxycycline and Lorcet  Home Medications   Current Outpatient Rx  Name  Route  Sig  Dispense  Refill  . EXPIRED: cetirizine (ZYRTEC) 10 MG tablet   Oral   Take 1 tablet (10 mg total) by mouth daily.   30 tablet   11   . ibuprofen (ADVIL,MOTRIN) 800 MG tablet   Oral   Take 1 tablet (800 mg total) by  mouth every 8 (eight) hours as needed for pain. 1 tab by mouth as needed for pain   30 tablet   3   . metroNIDAZOLE (FLAGYL) 500 MG tablet   Oral   Take 1 tablet (500 mg total) by mouth 2 (two) times daily.   14 tablet   0   . metroNIDAZOLE (FLAGYL) 500 MG tablet   Oral   Take 1 tablet (500 mg total) by mouth 2 (two) times daily. X 7 days   14 tablet   0     BP 142/81  Pulse 64  Temp(Src) 98.1 F (36.7 C) (Oral)  Resp 16  SpO2 99%  LMP 05/12/2012  Physical Exam  Nursing note and vitals reviewed. Constitutional: She is oriented to person, place, and time. She appears well-developed and well-nourished. No distress.  Neck: Normal range of motion.  Cardiovascular: Normal rate.   Pulmonary/Chest: Effort normal and breath sounds normal.  Abdominal: Soft. There is no rebound and no guarding.  Genitourinary:  Normal external female genitalia. Vaginal walls are coated with a thick white creamy discharge. There is also a pool of the same type discharged in the vaginal vault and codeine the ectocervix. The os is parous. The cervix is midline with no  visible lesions. Bimanual: Positive for cervical motion tenderness and right adnexal tenderness.  Musculoskeletal: She exhibits no edema and no tenderness.  Neurological: She is alert and oriented to person, place, and time. She exhibits normal muscle tone.  Skin: Skin is warm and dry.  Psychiatric: She has a normal mood and affect.    ED Course  Procedures (including critical care time)  Labs Reviewed  POCT URINALYSIS DIP (DEVICE) - Abnormal; Notable for the following:    Hgb urine dipstick TRACE (*)    All other components within normal limits  HIV ANTIBODY (ROUTINE TESTING)  POCT PREGNANCY, URINE  CERVICOVAGINAL ANCILLARY ONLY   No results found.  Results for orders placed during the hospital encounter of 08/12/12  POCT URINALYSIS DIP (DEVICE)      Result Value Range   Glucose, UA NEGATIVE  NEGATIVE mg/dL   Bilirubin  Urine NEGATIVE  NEGATIVE   Ketones, ur NEGATIVE  NEGATIVE mg/dL   Specific Gravity, Urine 1.015  1.005 - 1.030   Hgb urine dipstick TRACE (*) NEGATIVE   pH 7.0  5.0 - 8.0   Protein, ur NEGATIVE  NEGATIVE mg/dL   Urobilinogen, UA 0.2  0.0 - 1.0 mg/dL   Nitrite NEGATIVE  NEGATIVE   Leukocytes, UA NEGATIVE  NEGATIVE  POCT PREGNANCY, URINE      Result Value Range   Preg Test, Ur NEGATIVE  NEGATIVE       1. PID (acute pelvic inflammatory disease)   2. Vaginal discharge   3. BV (bacterial vaginosis)   4. Pelvic pain       MDM  And Rocephin 250 mg IM Azithromycin 1 g by mouth Flagyl 500 mg twice a day for 7 days Swabs obtained for vaginal infection.          Hayden Rasmussen, NP 08/12/12 1939

## 2012-08-12 NOTE — ED Notes (Signed)
Not in room, in bathroom collecting specimen

## 2012-08-12 NOTE — ED Notes (Signed)
Provided peanut butter and graham crackers post oral antibiotics.

## 2012-08-12 NOTE — ED Notes (Signed)
Patient aware she must wait post injection to make sure she does not have a reaction

## 2012-08-15 NOTE — ED Notes (Signed)
GC/Chlamydia neg., Affirm Candida and Trich neg., Gardnerella pos. Pt. adequately treated with Flagyl. Vassie Moselle 08/15/2012

## 2012-11-28 ENCOUNTER — Encounter (HOSPITAL_COMMUNITY): Payer: Self-pay | Admitting: Emergency Medicine

## 2012-11-28 ENCOUNTER — Other Ambulatory Visit (HOSPITAL_COMMUNITY)
Admission: RE | Admit: 2012-11-28 | Discharge: 2012-11-28 | Disposition: A | Discharge status: Home / Self Care | Payer: Medicaid Other | Source: Ambulatory Visit | Attending: Emergency Medicine | Admitting: Emergency Medicine

## 2012-11-28 ENCOUNTER — Emergency Department (HOSPITAL_COMMUNITY)
Admission: EM | Admit: 2012-11-28 | Discharge: 2012-11-28 | Disposition: A | Discharge status: Home / Self Care | Payer: Medicaid Other | Source: Home / Self Care | Attending: Emergency Medicine | Admitting: Emergency Medicine

## 2012-11-28 DIAGNOSIS — L0291 Cutaneous abscess, unspecified: Secondary | ICD-10-CM

## 2012-11-28 DIAGNOSIS — N760 Acute vaginitis: Secondary | ICD-10-CM | POA: Insufficient documentation

## 2012-11-28 DIAGNOSIS — N730 Acute parametritis and pelvic cellulitis: Secondary | ICD-10-CM

## 2012-11-28 DIAGNOSIS — Z113 Encounter for screening for infections with a predominantly sexual mode of transmission: Secondary | ICD-10-CM | POA: Insufficient documentation

## 2012-11-28 LAB — POCT URINALYSIS DIP (DEVICE)
Bilirubin Urine: NEGATIVE
Glucose, UA: NEGATIVE mg/dL
Ketones, ur: NEGATIVE mg/dL
Leukocytes, UA: NEGATIVE

## 2012-11-28 LAB — POCT PREGNANCY, URINE: Preg Test, Ur: NEGATIVE

## 2012-11-28 MED ORDER — OXYCODONE-ACETAMINOPHEN 5-325 MG PO TABS: ORAL_TABLET | ORAL | Status: DC

## 2012-11-28 MED ORDER — AZITHROMYCIN 250 MG PO TABS
ORAL_TABLET | ORAL | Status: AC
  Filled 2012-11-28: qty 4

## 2012-11-28 MED ORDER — SULFAMETHOXAZOLE-TMP DS 800-160 MG PO TABS: 2.0000 | ORAL_TABLET | Freq: Two times a day (BID) | ORAL | Status: DC

## 2012-11-28 MED ORDER — CEFTRIAXONE SODIUM 250 MG IJ SOLR
250.0000 mg | Freq: Once | INTRAMUSCULAR | Status: AC
  Administered 2012-11-28: 250 mg via INTRAMUSCULAR

## 2012-11-28 MED ORDER — CEFTRIAXONE SODIUM 250 MG IJ SOLR
INTRAMUSCULAR | Status: AC
  Filled 2012-11-28: qty 250

## 2012-11-28 MED ORDER — AZITHROMYCIN 250 MG PO TABS
1000.0000 mg | ORAL_TABLET | Freq: Once | ORAL | Status: AC
  Administered 2012-11-28: 1000 mg via ORAL

## 2012-11-28 MED ORDER — METRONIDAZOLE 500 MG PO TABS: 500.0000 mg | ORAL_TABLET | Freq: Three times a day (TID) | ORAL | Status: DC

## 2012-11-28 MED ORDER — FLUCONAZOLE 150 MG PO TABS: 150.0000 mg | ORAL_TABLET | Freq: Once | ORAL | Status: DC

## 2012-11-28 MED ORDER — LIDOCAINE HCL (PF) 1 % IJ SOLN
INTRAMUSCULAR | Status: AC
  Filled 2012-11-28: qty 5

## 2012-11-28 MED ORDER — MUPIROCIN 2 % EX OINT: TOPICAL_OINTMENT | CUTANEOUS | Status: DC

## 2012-11-28 NOTE — ED Provider Notes (Signed)
Chief Complaint:   Chief Complaint  Patient presents with  . Vaginal Discharge  . Back Pain    History of Present Illness:   Gail Bass is a 37 year old female who presents with a two-day history of what she thinks is an abscess on her left labia minora, anteriorly near the clitoris. There is a swollen, tender spot. It's not blistered or ulcerated. It's not draining any pus. She has a history of abscesses in the groin area in the past. Her daughter had MRSA several years ago. She also has a history of PID and notes some pelvic pain, or back pain, discharge, and older. She denies fever, chills, nausea, vomiting, or urinary symptoms.  Review of Systems:  Other than noted above, the patient denies any of the following symptoms: Systemic:  No fever, chills, sweats, or weight loss. GI:  No abdominal pain, nausea, anorexia, vomiting, diarrhea, constipation, melena or hematochezia. GU:  No dysuria, frequency, urgency, hematuria, vaginal discharge, itching, or abnormal vaginal bleeding. Skin:  No rash or itching.  PMFSH:  Past medical history, family history, social history, meds, and allergies were reviewed.  She's allergic to Lorcet and doxycycline. She's tried ibuprofen for the pain. She has allergies and asthma.  Physical Exam:   Vital signs:  LMP 11/20/2012 General:  Alert, oriented and in no distress. Lungs:  Breath sounds clear and equal bilaterally.  No wheezes, rales or rhonchi. Heart:  Regular rhythm.  No gallops or murmers. Abdomen:  Soft, flat and non-distended.  No organomegaly or mass.  No tenderness, guarding or rebound.  Bowel sounds normally active. Pelvic exam:  There is a tiny abscess on the left labium majora anteriorly, near the clitoris. This is tender to touch. It's no more than 5 mm in size. It's not fluctuant or draining. There is no ulceration or blistering. Otherwise external genitalia are normal. Vaginal and cervical mucosa were normal. There was no discharge or  bleeding. She has moderate cervical motion tenderness. Uterus is normal in size and shape and moderately tender. She has moderate bilateral adnexal tenderness, no mass. DNA probes for gonorrhea, Chlamydia, Trichomonas, Gardnerella, Candida were obtained. Skin:  Clear, warm and dry.  Labs:   Results for orders placed during the hospital encounter of 11/28/12  POCT URINALYSIS DIP (DEVICE)      Result Value Range   Glucose, UA NEGATIVE  NEGATIVE mg/dL   Bilirubin Urine NEGATIVE  NEGATIVE   Ketones, ur NEGATIVE  NEGATIVE mg/dL   Specific Gravity, Urine >=1.030  1.005 - 1.030   Hgb urine dipstick SMALL (*) NEGATIVE   pH 6.0  5.0 - 8.0   Protein, ur NEGATIVE  NEGATIVE mg/dL   Urobilinogen, UA 0.2  0.0 - 1.0 mg/dL   Nitrite NEGATIVE  NEGATIVE   Leukocytes, UA NEGATIVE  NEGATIVE  POCT PREGNANCY, URINE      Result Value Range   Preg Test, Ur NEGATIVE  NEGATIVE     Course in Urgent Care Center:   Given Rocephin 250 mg IM and azithromycin 1000 mg by mouth.  Assessment:  The primary encounter diagnosis was Abscess. Diagnoses of Vaginitis and PID (acute pelvic inflammatory disease) were also pertinent to this visit.  Symptoms are consistent with mild PID. Will treat with Rocephin, azithromycin, and Flagyl. Flagyl should also cover for bacterial vaginosis. For the small abscess was given trimethoprim/sulfa. Suggested a decontamination regimen with Clorox baths and mupirocin ointment to the nostrils since this does seem to be recurrent problem. It is possible this could  be MRSA then she's been exposed to a daughter who has had MRSA in the past.  Plan:   1.  Meds:  The following meds were prescribed:   Discharge Medication List as of 11/28/2012  9:10 PM    START taking these medications   Details  fluconazole (DIFLUCAN) 150 MG tablet Take 1 tablet (150 mg total) by mouth once., Starting 11/28/2012, Normal    !! metroNIDAZOLE (FLAGYL) 500 MG tablet Take 1 tablet (500 mg total) by mouth 3 (three)  times daily., Starting 11/28/2012, Until Discontinued, Normal    mupirocin ointment (BACTROBAN) 2 % Apply to nostrils TID for 1 month., Normal    oxyCODONE-acetaminophen (PERCOCET) 5-325 MG per tablet 1 to 2 tablets every 6 hours as needed for pain., Print    sulfamethoxazole-trimethoprim (BACTRIM DS) 800-160 MG per tablet Take 2 tablets by mouth 2 (two) times daily., Starting 11/28/2012, Until Discontinued, Normal     !! - Potential duplicate medications found. Please discuss with provider.      2.  Patient Education/Counseling:  The patient was given appropriate handouts, self care instructions, and instructed in symptomatic relief.  Suggested she avoid intercourse for the next week. Will call her with any positive results on a DNA probes.  3.  Follow up:  The patient was told to follow up if no better in 3 to 4 days, if becoming worse in any way, and given some red flag symptoms such as worsening pain, fever, or persistent vomiting which would prompt immediate return.  Follow up here if necessary.     Reuben Likes, MD 11/28/12 [phone]

## 2012-11-28 NOTE — ED Notes (Signed)
C/o multiple issues.  See physician note.

## 2012-11-30 NOTE — ED Notes (Signed)
GC/Chlamydia neg., Affirm: Candida and Trich neg., Gardnerella pos.  Pt. adequately treated with Flagyl. Aundray Cartlidge M 11/30/2012  

## 2013-03-21 ENCOUNTER — Ambulatory Visit (INDEPENDENT_AMBULATORY_CARE_PROVIDER_SITE_OTHER): Payer: Medicaid Other | Admitting: Family Medicine

## 2013-03-21 ENCOUNTER — Encounter: Payer: Self-pay | Admitting: Family Medicine

## 2013-03-21 VITALS — BP 120/70 | HR 72 | Temp 98.8°F | Ht 63.0 in | Wt 128.0 lb

## 2013-03-21 DIAGNOSIS — N644 Mastodynia: Secondary | ICD-10-CM

## 2013-03-21 DIAGNOSIS — Z87891 Personal history of nicotine dependence: Secondary | ICD-10-CM | POA: Insufficient documentation

## 2013-03-21 DIAGNOSIS — Z2089 Contact with and (suspected) exposure to other communicable diseases: Secondary | ICD-10-CM

## 2013-03-21 DIAGNOSIS — L723 Sebaceous cyst: Secondary | ICD-10-CM

## 2013-03-21 DIAGNOSIS — Z207 Contact with and (suspected) exposure to pediculosis, acariasis and other infestations: Secondary | ICD-10-CM | POA: Insufficient documentation

## 2013-03-21 DIAGNOSIS — F17200 Nicotine dependence, unspecified, uncomplicated: Secondary | ICD-10-CM

## 2013-03-21 DIAGNOSIS — N63 Unspecified lump in unspecified breast: Secondary | ICD-10-CM

## 2013-03-21 DIAGNOSIS — N6310 Unspecified lump in the right breast, unspecified quadrant: Secondary | ICD-10-CM | POA: Insufficient documentation

## 2013-03-21 HISTORY — DX: Mastodynia: N64.4

## 2013-03-21 HISTORY — DX: Mastodynia: N63.10

## 2013-03-21 MED ORDER — VARENICLINE TARTRATE 0.5 MG X 11 & 1 MG X 42 PO MISC: ORAL | Status: DC

## 2013-03-21 MED ORDER — PERMETHRIN 5 % EX CREA: 1.0000 "application " | TOPICAL_CREAM | Freq: Once | CUTANEOUS | Status: DC

## 2013-03-21 MED ORDER — VARENICLINE TARTRATE 0.5 MG PO TABS: 0.5000 mg | ORAL_TABLET | Freq: Two times a day (BID) | ORAL | Status: DC

## 2013-03-21 NOTE — Progress Notes (Signed)
   Subjective:    Patient ID: Gail Bass, female    DOB: 03/01/1976, 38 y.o.   MRN: [id number]  HPI 38 yo F presents with her daughter with multiple concerns:  1. R breast mass: first noticed 3 weeks ago. Tender. Most tender during period. LMP 03/13/13 lasted 7 days. Mass is now less tender and smaller than before. No skin changes. No fever, chills, weight loss. Patient is a smoker. Does not know her mother's family history.   2. Smoking: quit for 6 years. Restarted 09/2012. Smoking 1/2-1/3 PPD. Would like to restart chantix as she was able to quit with chantix last time. Denies SI.   3. Scabies exposure: slept over at a friend house. Friend had scabies. Patient is itching, worse at night. Her daughters are also itching. They have eczema and itch at baseline.   4. L thigh abscess: comes and goes x 3 year. Occasionally gets big, painful, drains yellow fluid. Not bi or painful now.   5. Hairloss: deferred to f/u   Review of Systems As per HPI     Objective:   Physical Exam BP 120/70  Pulse 72  Temp(Src) 98.8 F (37.1 C) (Oral)  Ht 5\' 3"  (1.6 m)  Wt 128 lb (58.06 kg)  BMI 22.68 kg/m2  LMP 03/13/2013 General appearance: alert, cooperative and no distress Breasts: Inspection negative, No nipple retraction or dimpling, No nipple discharge or bleeding, No axillary or supraclavicular adenopathy, positive findings: fibrocystic changes and R breast mass at area of maximal tenderness located at 8 o'clock 2 cm from nipple Skin: L thigh small papule. slightly erythematous w/o edema or fluctance       Assessment & Plan:

## 2013-03-21 NOTE — Assessment & Plan Note (Signed)
A: smoking. Ready to quit. P: restart chantix to help with successful cessation.

## 2013-03-21 NOTE — Assessment & Plan Note (Signed)
A: scabies exposure.  P:  peremethin  cream.

## 2013-03-21 NOTE — Assessment & Plan Note (Signed)
A: tender R breast lump between 8-9 o' clock 2 cm from nipple. Suspect fibrocystic changes given relation to periods and decrease in size. Plan to evaluate with ultrasound since patient's family hx is unknown and she is a smoker.  P: R breast U/S

## 2013-03-21 NOTE — Assessment & Plan Note (Signed)
A: history of lesion consistent with sebaceous cyst P: plan for incision and capsule removal.

## 2013-03-21 NOTE — Patient Instructions (Signed)
[name],  It was very nice to meet you and your daughter today.  1. Smoking: restart chantix per directions. Set quit date for 8 days to 5 weeks after starting chantix. We can continue for 12-24 weeks if needed.  2. Scabies exposure: use cream neck down, leave on overnight, wash off. Repeat in 2 weeks. If you have scabies the itching will likely worsen after treatment.   3. R breast lump: likely fibrocystic change. Evaluating with mammogram and ultrasound.  4. Sebaceous cyst: L thigh. Schedule removal with me in the office. This is a 15 min procedure.   Scabies Scabies are small bugs (mites) that burrow under the skin and cause red bumps and severe itching. These bugs can only be seen with a microscope. Scabies are highly contagious. They can spread easily from person to person by direct contact. They are also spread through sharing clothing or linens that have the scabies mites living in them. It is not unusual for an entire family to become infected through shared towels, clothing, or bedding.  HOME CARE INSTRUCTIONS   Your caregiver may prescribe a cream or lotion to kill the mites. If cream is prescribed, massage the cream into the entire body from the neck to the bottom of both feet. Also massage the cream into the scalp and face if your child is less than 36 year old. Avoid the eyes and mouth. Do not wash your hands after application.  Leave the cream on for 8 to 12 hours. Your child should bathe or shower after the 8 to 12 hour application period. Sometimes it is helpful to apply the cream to your child right before bedtime.  One treatment is usually effective and will eliminate approximately 95% of infestations. For severe cases, your caregiver may decide to repeat the treatment in 1 week. Everyone in your household should be treated with one application of the cream.  New rashes or burrows should not appear within 24 to 48 hours after successful treatment. However, the itching and  rash may last for 2 to 4 weeks after successful treatment. Your caregiver may prescribe a medicine to help with the itching or to help the rash go away more quickly.  Scabies can live on clothing or linens for up to 3 days. All of your child's recently used clothing, towels, stuffed toys, and bed linens should be washed in hot water and then dried in a dryer for at least 20 minutes on high heat. Items that cannot be washed should be enclosed in a plastic bag for at least 3 days.  To help relieve itching, bathe your child in a cool bath or apply cool washcloths to the affected areas.  Your child may return to school after treatment with the prescribed cream. SEEK MEDICAL CARE IF:   The itching persists longer than 4 weeks after treatment.  The rash spreads or becomes infected. Signs of infection include red blisters or yellow-tan crust. Document Released: 02/16/2005 Document Revised: 05/11/2011 Document Reviewed: 06/27/2008 [facility] Patient Information 2014 [place], [place].

## 2013-04-05 ENCOUNTER — Ambulatory Visit (INDEPENDENT_AMBULATORY_CARE_PROVIDER_SITE_OTHER): Payer: Medicaid Other | Admitting: Family Medicine

## 2013-04-05 ENCOUNTER — Encounter: Payer: Self-pay | Admitting: Family Medicine

## 2013-04-05 ENCOUNTER — Other Ambulatory Visit (HOSPITAL_COMMUNITY)
Admission: RE | Admit: 2013-04-05 | Discharge: 2013-04-05 | Disposition: A | Discharge status: Home / Self Care | Payer: Medicaid Other | Source: Ambulatory Visit | Attending: Family Medicine | Admitting: Family Medicine

## 2013-04-05 VITALS — BP 140/88 | HR 78 | Temp 98.5°F | Ht 63.0 in | Wt 129.0 lb

## 2013-04-05 DIAGNOSIS — R300 Dysuria: Secondary | ICD-10-CM

## 2013-04-05 DIAGNOSIS — H109 Unspecified conjunctivitis: Secondary | ICD-10-CM

## 2013-04-05 DIAGNOSIS — Z113 Encounter for screening for infections with a predominantly sexual mode of transmission: Secondary | ICD-10-CM | POA: Insufficient documentation

## 2013-04-05 DIAGNOSIS — J069 Acute upper respiratory infection, unspecified: Secondary | ICD-10-CM

## 2013-04-05 DIAGNOSIS — N898 Other specified noninflammatory disorders of vagina: Secondary | ICD-10-CM

## 2013-04-05 LAB — POCT URINALYSIS DIPSTICK
BILIRUBIN UA: NEGATIVE
Glucose, UA: NEGATIVE
KETONES UA: NEGATIVE
LEUKOCYTES UA: NEGATIVE
Nitrite, UA: NEGATIVE
PH UA: 6.5
PROTEIN UA: NEGATIVE
SPEC GRAV UA: 1.025
Urobilinogen, UA: NEGATIVE

## 2013-04-05 LAB — POCT WET PREP (WET MOUNT): CLUE CELLS WET PREP WHIFF POC: NEGATIVE

## 2013-04-05 MED ORDER — AMOXICILLIN-POT CLAVULANATE 875-125 MG PO TABS: 1.0000 | ORAL_TABLET | Freq: Two times a day (BID) | ORAL | Status: DC

## 2013-04-05 MED ORDER — FLUCONAZOLE 150 MG PO TABS: 150.0000 mg | ORAL_TABLET | Freq: Once | ORAL | Status: DC

## 2013-04-05 NOTE — Addendum Note (Signed)
Addended by: Tanna SavoyPROPOSITO, Rafeal Skibicki S on: 04/05/2013 02:27 PM   Modules accepted: Orders

## 2013-04-05 NOTE — Progress Notes (Signed)
Patient ID: Gail Skainsatricia L Kensinger, female   DOB: 09/03/1975, 38 y.o.   MRN: [id number] FAMILY MEDICINE OFFICE NOTE  Chief Complaint:  Pink eye, ?UTI  Primary Care Physician: Lora PaulaFUNCHES, JOSALYN C, MD  HPI:  Gail Bass is a 38 yo here for ?pink eye and UTI.   1) URI symptoms - yesterday had a red eye and she was told by her employer that she had to come in because of possible pink eye - today her eye is 80% better  - has had URI symptoms  - started 2 weeks ago.  - initially had fever chills, cough, sneezing.  - now having headaches, feeling fatigued.  No SOB, chest pain  Hx of asthma, hasn't needed inhaler  2) UTI symptoms - last week had intercourse - the next day had dysuria and urinary frequency - had 3 keflex tablets left so she took them  - symptoms have gotten better  - wondering if the infection is gone  No hematuria, back pain.    3) vaginal discharge - also having some green discharge  - started 3 days ago Did have protected sex with a partner who is off and on.  Hx of BTL - no odor - no abd pain    PMHx:  Past Medical History  Diagnosis Date  . TUBAL PREGNANCY 09/13/2008    Qualifier: History of  By: Lanier PrudeBolden  MD, Cathrine Musteraineisha    . Bacterial vaginosis   . Asthma     Past Surgical History  Procedure Laterality Date  . Cystectomy    . Tailbone cyst      FAMHx:  No family history on file.  SOCHx:   reports that she has quit smoking. She started smoking about 4 months ago. She has never used smokeless tobacco. She reports that she does not drink alcohol or use illicit drugs.  ALLERGIES:  Allergies  Allergen Reactions  . Doxycycline     REACTION: Nausea  . Lorcet [Hydrocodone-Acetaminophen] Nausea And Vomiting and Rash    ROS: Pertinent ROS as seen in HPI. Otherwise negative.   HOME MEDS: Current Outpatient Prescriptions  Medication Sig Dispense Refill  . amoxicillin-clavulanate (AUGMENTIN) 875-125 MG per tablet Take 1 tablet by mouth 2 (two)  times daily.  20 tablet  0  . fluconazole (DIFLUCAN) 150 MG tablet Take 1 tablet (150 mg total) by mouth once.  1 tablet  1  . ibuprofen (ADVIL,MOTRIN) 800 MG tablet Take 1 tablet (800 mg total) by mouth every 8 (eight) hours as needed for pain. 1 tab by mouth as needed for pain  30 tablet  3  . permethrin (ELIMITE) 5 % cream Apply 1 application topically once.  60 g  1  . varenicline (CHANTIX STARTING MONTH PAK) 0.5 MG X 11 & 1 MG X 42 tablet Take one 0.5 mg tablet by mouth once daily for 3 days, then increase to one 0.5 mg tablet twice daily for 4 days, then increase to one 1 mg tablet twice daily.  53 tablet  0   No current facility-administered medications for this visit.    LABS/IMAGING: Results for orders placed in visit on 04/05/13 (from the past 48 hour(s))  POCT URINALYSIS DIPSTICK     Status: Abnormal   Collection Time    04/05/13  1:30 AM      Result Value Range   Color, UA yellow     Clarity, UA clear     Glucose, UA negative     Bilirubin, UA  negative     Ketones, UA negative     Spec Grav, UA 1.025     Blood, UA small     pH, UA 6.5     Protein, UA negative     Urobilinogen, UA negative     Nitrite, UA negative     Leukocytes, UA Negative     No results found.  VITALS: BP 140/88  Pulse 78  Temp(Src) 98.5 F (36.9 C) (Oral)  Ht 5\' 3"  (1.6 m)  Wt 129 lb (58.514 kg)  BMI 22.86 kg/m2  LMP 03/13/2013  EXAM: Gen: NAD, well appearing HEENT: oropharynx clear but slight erythema without exudate, pupils equal and reactive, EOMI. Left eye with conjunctival irritation but no purulent drainage.  Slight frontal sinus tenderness.  PULM: LCTAB, no wheezes/rhonchi/rales CV: RRR, no murmurs ABD: soft, NT, ND GU: irritated appearing vaginal mucosa, cottage cheese like discharge in the vault. Normal cervix, bladder and adnexa.  No CMT or adnexal tenderness.  EXT: 2+ DP pulses, no edema    ASSESSMENT: Dysuria - Plan: POCT Urinalysis Dipstick  Vaginal discharge  URI,  acute  Conjunctivitis  PLAN:  1) dysuria - UA negative - discussed may not see anything on UA due to abx last week - however, since symptoms improved likely resolved - f/u if symptoms return.   2) vag discharge - exam most consistent with yeast likely due to recent abx use.  - wet prep and gc/chl sent - rx of diflucan  3) URI symptoms  X 2 weeks.   Discussed could still be viral  - rx of augmentin printed and advised should she not improve by the weekend, can fill for presumed sinusitis  4) conjunctivitis - already improving - viral in appearance - ok to return to work - ok to use pataday for relief of itching  Marki Frede L, MD

## 2013-04-05 NOTE — Patient Instructions (Signed)

## 2013-04-10 ENCOUNTER — Other Ambulatory Visit: Payer: Self-pay

## 2013-04-10 ENCOUNTER — Ambulatory Visit
Admission: RE | Admit: 2013-04-10 | Discharge: 2013-04-10 | Disposition: A | Discharge status: Home / Self Care | Payer: Medicaid Other | Source: Ambulatory Visit | Attending: Family Medicine | Admitting: Family Medicine

## 2013-04-10 ENCOUNTER — Ambulatory Visit (INDEPENDENT_AMBULATORY_CARE_PROVIDER_SITE_OTHER): Payer: Medicaid Other | Admitting: Family Medicine

## 2013-04-10 VITALS — BP 119/77 | HR 59 | Wt 126.0 lb

## 2013-04-10 DIAGNOSIS — N6310 Unspecified lump in the right breast, unspecified quadrant: Secondary | ICD-10-CM

## 2013-04-10 DIAGNOSIS — L723 Sebaceous cyst: Secondary | ICD-10-CM

## 2013-04-10 DIAGNOSIS — N644 Mastodynia: Principal | ICD-10-CM

## 2013-04-10 MED ORDER — ACETAMINOPHEN-CODEINE #3 300-30 MG PO TABS: 1.0000 | ORAL_TABLET | Freq: Four times a day (QID) | ORAL | Status: DC | PRN

## 2013-04-10 NOTE — Patient Instructions (Signed)
Ms. Renae GlossShelton,  Thank you for coming in today.  I have removed your cyst.   Keep the wound covered and dry for the next 24 hours. After 24 hrs wash gently. Pat dry Apply Vaseline to wound.   Return in 10-14 days for suture removal.   Call if you develop redness at site, tenderness or drainage.  Dr. Armen PickupFunches

## 2013-04-10 NOTE — Assessment & Plan Note (Signed)
A: sebaceous cyst.  Plan: Cystectomy.  1. Instructed to keep the wound dry and covered for 24-48h and clean thereafter. 2. Warning signs of infection were reviewed.   3. Recommended that the patient use Tylenol #3 as needed for pain.  4. Return for suture removal in 10- 14  days.

## 2013-04-10 NOTE — Progress Notes (Signed)
Patient ID: Gail Skainsatricia L Huot, female   DOB: 11/27/1975, 38 y.o.   MRN: [id number] Sebaceous Cyst Excision Procedure Note  Pre-operative Diagnosis: Epidermal cyst  Post-operative Diagnosis: same  Locations:left, medial thigh  Indications: recurrent swelling, pain and drainage   Anesthesia: Lidocaine 1% with epinephrine without added sodium bicarbonate  Procedure Details  History of allergy to iodine: no  Patient informed of the risks (including bleeding and infection) and benefits of the  procedure and Written informed consent obtained.  The lesion and surrounding area was given a sterile prep using and betadyne and draped in the usual sterile fashion. An 1.5 cm incision was made over the cyst, which was dissected free of the surrounding tissue and removed.  The cyst was empty.  The wound was closed with 4-0 Vicryl using simple interrupted stitches, 3 simple stitches. A sterile dressing was applied.   The specimen was not sent for pathologic examination. The patient tolerated the procedure well.  EBL: 5 ml  Findings: Small empty cyst   Condition: Stable  Complications: none.  Plan: 1. Instructed to keep the wound dry and covered for 24-48h and clean thereafter. 2. Warning signs of infection were reviewed.   3. Recommended that the patient use Tylenol #3 as needed for pain.  4. Return for suture removal in 10- 14  days.

## 2013-04-25 ENCOUNTER — Ambulatory Visit: Payer: Self-pay | Admitting: Family Medicine

## 2013-05-02 ENCOUNTER — Ambulatory Visit (INDEPENDENT_AMBULATORY_CARE_PROVIDER_SITE_OTHER): Payer: Medicaid Other | Admitting: Family Medicine

## 2013-05-02 ENCOUNTER — Encounter: Payer: Self-pay | Admitting: Family Medicine

## 2013-05-02 VITALS — BP 147/83 | HR 63 | Temp 99.2°F | Ht 63.0 in | Wt 126.0 lb

## 2013-05-02 DIAGNOSIS — Z207 Contact with and (suspected) exposure to pediculosis, acariasis and other infestations: Secondary | ICD-10-CM

## 2013-05-02 DIAGNOSIS — Z2089 Contact with and (suspected) exposure to other communicable diseases: Secondary | ICD-10-CM

## 2013-05-02 DIAGNOSIS — L659 Nonscarring hair loss, unspecified: Secondary | ICD-10-CM

## 2013-05-02 DIAGNOSIS — L723 Sebaceous cyst: Secondary | ICD-10-CM

## 2013-05-02 NOTE — Patient Instructions (Signed)
Thank you for coming in today You are doing well overall  Please start using the vitamin E cream/ointment on your scar Gently massage your scar multiple times a day and apply heat when able Please make a follow up appointment with Dr. Armen PickupFunches for the hair loss and to review your lab work

## 2013-05-02 NOTE — Assessment & Plan Note (Addendum)
Unsure of exact cause. Likely nml variant but some concern for androgenic cause given the h/o increase hair production on face and acne after having tubes excised from ectopic pregnancies x2. May also be thyroid related.  Free and total Testosterone TSH Consider derm referral vs rogan (but must cont for life if starts) Pt precepted with Dr. Lum BabeEniola and Randolm IdolFletke

## 2013-05-02 NOTE — Assessment & Plan Note (Signed)
resolved 

## 2013-05-02 NOTE — Assessment & Plan Note (Signed)
Excision site w/o evidence of infection or recurrence. Small amount of medially tracking scar tissue that may be causing symptoms.  Stop shaving area Vitamin E cream/oint w/ gentle massage to the area Precautions given

## 2013-05-02 NOTE — Progress Notes (Signed)
Subjective:     Patient ID: Gail Bass, female   DOB: 1976-02-18, 38 y.o.   MRN: [id number]  HPI:  Gail Bass is a 38 yo with a history of asthma and depression that presents for follow up of a L thigh cyst removal and hair loss.  She had developed 2 small 1 cm cysts on her L thigh about 3 years ago.  They had become increasingly painful, with modest swelling around the cyst and yellow/white drainage.  She had them removed on 2/9 by Dr. Sidney Ace.  Since the removal, she has noticed a hardened area beneath the scar that is tender to touch.  The area beneath the scar runs about 5 cm down her inner thigh and has become increasingly painful.  There is no pain or erythema around the incision site, and she has not had any other drainage.  She denies fever/chills.  Her concern is that the cyst has recurred or was not entirely removed in the first place.  The patient also complains of a 3-4 year period of hair loss.  She claims that she is consistently losing excess amounts of her, particularly from the front of her scalp.  Her brush also accumulates a lot of hair during grooming.  There is one spot on the scalp, midline, just above the forehead that is particularly tender.  She does endorse "flaky and dry skin," but has not had brittle nails or recent weight changes.   She also notes some increase fine hair production on her face since having bilat salpingectomy. Also complaining of significant acne resulting in facial scaring    Review of Systems:  As reported in the HPI, otherwise negative.     Objective:   Physical Exam\  Skin:  General:  Dry skin, non-scaly  L thigh--1 by 1.5 cm scar on the inner thigh.  No erythema, no swelling.  Scar is still red but not tender to touch.  Fine band of scar tissue felt tracking medially appriximately 3-4cm from the surgical site. No purulent dc.  HEENT:  Thinning hair along the frontal scalp region. No scaling/erythema. Increased fine hair on face   CV:  Normal no m/r/g  Pulm:  Normal, no wheezes, rales or rhonchi or wheezing      Assessment:      Gail Bass is a 38 yo with a history of asthma and depression that presents for follow up of a L thigh cyst removal and hair loss.  Despite her concerns about persisting or recurring cysts, the exam in consistent with the complete cyst excision and normal wound healing.  She should be counseled that the scar tissue is a normal part of the healing process.  Her hair loss is concerning for hormonal or thyroid imbalances, and these etiologies should be investigated before a more thorough work up is warranted      Plan:     1.  Hair loss  -TSH  -Free testosterone and total testosterone level  2. L thigh cyst     -Follow as healing continues.  No additional medications indicated.     --------------------------------------------  Resident Addendum  Pt seen and examined by myself w/ additions made to the above note w/ A/P noted below.    Hair loss Unsure of exact cause. Likely nml variant but some concern for androgenic cause given the h/o increase hair production on face and acne after having tubes excised from ectopic pregnancies x2. May also be thyroid related.  Free and total Testosterone  TSH Consider derm referral vs rogan (but must cont for life if starts) Pt precept[facility] with Dr[facility]iola [facility]  Sebaceous cyst Excision site w/o evidence of infection or recurrence. Small amount of medially tracking scar tissue that may be causing symptoms.  Stop shaving area Vitamin E cream/oint w/ gentle massage to the area Precautions given  Exposure to scabies resolved   Shelly Flattenavid Merrell, MD Family Medicine PGY-3 05/02/2013, 3:36 PM

## 2013-05-03 LAB — TSH: TSH: 0.996 u[IU]/mL (ref 0.350–4.500)

## 2013-05-03 LAB — TESTOSTERONE, FREE, TOTAL, SHBG
SEX HORMONE BINDING: 42 nmol/L (ref 18–114)
TESTOSTERONE FREE: 9.4 pg/mL — AB (ref 0.6–6.8)
TESTOSTERONE-% FREE: 1.6 % (ref 0.4–2.4)
Testosterone: 60 ng/dL (ref 10–70)

## 2013-05-03 LAB — TESTOSTERONE, FREE

## 2013-05-11 ENCOUNTER — Ambulatory Visit (INDEPENDENT_AMBULATORY_CARE_PROVIDER_SITE_OTHER): Payer: Medicaid Other | Admitting: Family Medicine

## 2013-05-11 ENCOUNTER — Encounter: Payer: Self-pay | Admitting: Family Medicine

## 2013-05-11 VITALS — BP 142/87 | HR 54 | Temp 98.5°F | Wt 127.0 lb

## 2013-05-11 DIAGNOSIS — L659 Nonscarring hair loss, unspecified: Secondary | ICD-10-CM

## 2013-05-11 DIAGNOSIS — I10 Essential (primary) hypertension: Secondary | ICD-10-CM | POA: Insufficient documentation

## 2013-05-11 HISTORY — DX: Essential (primary) hypertension: I10

## 2013-05-11 MED ORDER — SPIRONOLACTONE 50 MG PO TABS: 50.0000 mg | ORAL_TABLET | Freq: Every day | ORAL | Status: DC

## 2013-05-11 NOTE — Assessment & Plan Note (Signed)
A: reviewed labs. Exam consistent with female pattern hairloss. Elevated free Testerone.  P: 1. Start spironolactone 50 mg once daily, the goal is to get to 200 mg once daily over the next few months.  2. Once up to 200 mg the plan is for you to continue the medication for at least 6 months to assess response.  3. There is the option of doing the spironolactone with the rogaine once we are up to a therapeutic level on the spironolactone.   Please f/u in one week for repeat BMP to check potassium and BP check

## 2013-05-11 NOTE — Progress Notes (Signed)
   Subjective:    Patient ID: Gail Bass, female    DOB: 05/30/1975, 38 y.o.   MRN: [id number]  HPI 38 yo F presents for f/u visit:  1. Hair loss: patient with hairloss for last 3-4 years. She has a circular patch of hairloss at the R occiput. She also has hairloss around the front of her scalp. She has tried some essential oil treatments from her beautician x 2 years. This helped her daughter but did not help her. She admits that when she had her ovarian tubes removed she started to develop female pattern hair growth (around nipples, chest, sideburns) and acne.   2. Smoking: she had not yet started chantix. She is ready to quit.   3. Elevated BP: patient reports feeling stressed today. She is driving to her mother next week. They are estranged. She denies HA, vision changes, CP, SOB.   Review of Systems As per HPI     Objective:   Physical Exam BP 142/87  Pulse 54  Temp(Src) 98.5 F (36.9 C) (Oral)  Wt 127 lb (57.607 kg)  LMP 04/18/2013 BP Readings from Last 3 Encounters:  05/11/13 142/87  05/02/13 147/83  04/10/13 119/77   Wt Readings from Last 3 Encounters:  05/11/13 127 lb (57.607 kg)  05/02/13 126 lb (57.153 kg)  04/10/13 126 lb (57.153 kg)   General appearance: alert, cooperative and no distress Head: Normocephalic, without obvious abnormality, atraumatic there is a 2x2 cm circular patch of hairloss R occiput.  Skin: Skin color, texture, turgor normal. No rashes or lesions, scalp has one pustule.     Chemistry      Component Value Date/Time   NA 139 04/26/2012 1942   K 3.8 04/26/2012 1942   CL 103 04/26/2012 1942   CO2 30 04/26/2012 1942   BUN 12 04/26/2012 1942   CREATININE 0.66 04/26/2012 1942      Component Value Date/Time   CALCIUM 9.1 04/26/2012 1942   ALKPHOS 65 04/26/2012 1942   AST 15 04/26/2012 1942   ALT 13 04/26/2012 1942   BILITOT 0.3 04/26/2012 1942          Assessment & Plan:

## 2013-05-11 NOTE — Assessment & Plan Note (Signed)
A: elevated x 2 OV. Smoker. P: Smoking cessation Spironolactone for hair loss and elevated BP.

## 2013-05-11 NOTE — Patient Instructions (Signed)
Gail Bass,  Gail Bass. For hair loss:  1. Start spironolactone 50 mg once daily, the goal is to get to 200 mg once daily over the next few months.  2. Once up to 200 mg the plan is for you to continue the medication for at least 6 months to assess response.  3. There is the option of doing the spironolactone with the rogaine once we are up to a therapeutic level on the spironolactone.   Please f/u in one week for repeat BMP to check potassium and BP check.   Dr. Armen PickupFunches   Potential side effects of spironolactone include headache, decreased libido, menstrual irregularities, orthostatic hypotension, fatigue, and hyperkalemia. Serum potassium levels and blood pressure should be followed periodically

## 2013-05-31 ENCOUNTER — Other Ambulatory Visit: Payer: Medicaid Other

## 2013-05-31 ENCOUNTER — Ambulatory Visit (INDEPENDENT_AMBULATORY_CARE_PROVIDER_SITE_OTHER): Payer: Medicaid Other | Admitting: *Deleted

## 2013-05-31 VITALS — BP 132/88

## 2013-05-31 DIAGNOSIS — Z0130 Encounter for examination of blood pressure without abnormal findings: Secondary | ICD-10-CM

## 2013-05-31 DIAGNOSIS — Z136 Encounter for screening for cardiovascular disorders: Secondary | ICD-10-CM

## 2013-05-31 DIAGNOSIS — L659 Nonscarring hair loss, unspecified: Secondary | ICD-10-CM

## 2013-05-31 LAB — BASIC METABOLIC PANEL
BUN: 11 mg/dL (ref 6–23)
CO2: 27 meq/L (ref 19–32)
Calcium: 9.3 mg/dL (ref 8.4–10.5)
Chloride: 103 mEq/L (ref 96–112)
Creat: 0.68 mg/dL (ref 0.50–1.10)
Glucose, Bld: 92 mg/dL (ref 70–99)
Potassium: 4 mEq/L (ref 3.5–5.3)
SODIUM: 139 meq/L (ref 135–145)

## 2013-05-31 NOTE — Progress Notes (Signed)
   Pt in for blood pressure check.  BP 132/88 manually.  Pt denies SOB, chest pain, vision changes or headache at time.  Pt stated she just started taken medication about 6 days ago.  Pt has not taken medication today, usually around 11 AM is the time for medication.  Clovis PuMartin, Tamika L, RN

## 2013-05-31 NOTE — Addendum Note (Signed)
Addended by: Dessa PhiFUNCHES, Pearlee Arvizu on: 05/31/2013 01:20 PM   Modules accepted: Level of Service

## 2013-05-31 NOTE — Progress Notes (Signed)
BMP DONE TODAY Gail Bass 

## 2013-05-31 NOTE — Progress Notes (Addendum)
Patient ID: Gail Skainsatricia L Shimp, female   DOB: 09/26/1975, 38 y.o.   MRN: [id number] Reviewed BP at goal. Will continue current regimen.  Patient will be contact with f/u BMP results.  Patient to plan f/u with me in 2-3 weeks pending normal BMP.

## 2013-09-20 ENCOUNTER — Ambulatory Visit (INDEPENDENT_AMBULATORY_CARE_PROVIDER_SITE_OTHER): Payer: Medicaid Other | Admitting: Family Medicine

## 2013-09-20 ENCOUNTER — Encounter: Payer: Self-pay | Admitting: Family Medicine

## 2013-09-20 ENCOUNTER — Other Ambulatory Visit (HOSPITAL_COMMUNITY)
Admission: RE | Admit: 2013-09-20 | Discharge: 2013-09-20 | Disposition: A | Discharge status: Home / Self Care | Payer: Medicaid Other | Source: Ambulatory Visit | Attending: Family Medicine | Admitting: Family Medicine

## 2013-09-20 VITALS — BP 142/99 | HR 68 | Temp 98.4°F | Ht 62.0 in | Wt 121.0 lb

## 2013-09-20 DIAGNOSIS — Z113 Encounter for screening for infections with a predominantly sexual mode of transmission: Secondary | ICD-10-CM | POA: Insufficient documentation

## 2013-09-20 DIAGNOSIS — N760 Acute vaginitis: Secondary | ICD-10-CM | POA: Insufficient documentation

## 2013-09-20 DIAGNOSIS — R55 Syncope and collapse: Secondary | ICD-10-CM

## 2013-09-20 LAB — POCT WET PREP (WET MOUNT): Clue Cells Wet Prep Whiff POC: NEGATIVE

## 2013-09-20 NOTE — Patient Instructions (Signed)
I will call you if your test results are not normal.  Otherwise, I will send you a letter.  If you do not hear from me with in 2 weeks please call our office.      Follow up with Dr. Randolm Idol to meet him and discuss hair loss.  Be well, Dr. Pollie Meyer      Vasovagal Syncope, Adult Syncope, commonly known as fainting, is a temporary loss of consciousness. It occurs when the blood flow to the brain is reduced. Vasovagal syncope (also called neurocardiogenic syncope) is a fainting spell in which the blood flow to the brain is reduced because of a sudden drop in heart rate and blood pressure. Vasovagal syncope occurs when the brain and the cardiovascular system (blood vessels) do not adequately communicate and respond to each other. This is the most common cause of fainting. It often occurs in response to fear or some other type of emotional or physical stress. The body has a reaction in which the heart starts beating too slowly or the blood vessels expand, reducing blood pressure. This type of fainting spell is generally considered harmless. However, injuries can occur if a person takes a sudden fall during a fainting spell.  CAUSES  Vasovagal syncope occurs when a person's blood pressure and heart rate decrease suddenly, usually in response to a trigger. Many things and situations can trigger an episode. Some of these include:   Pain.   Fear.   The sight of blood or medical procedures, such as blood being drawn from a vein.   Common activities, such as coughing, swallowing, stretching, or going to the bathroom.   Emotional stress.   Prolonged standing, especially in a warm environment.   Lack of sleep or rest.   Prolonged lack of food.   Prolonged lack of fluids.   Recent illness.  The use of certain drugs that affect blood pressure, such as cocaine, alcohol, marijuana, inhalants, and opiates.  SYMPTOMS  Before the fainting episode, you may:   Feel dizzy or light headed.    Become pale.  Sense that you are going to faint.   Feel like the room is spinning.   Have tunnel vision, only seeing directly in front of you.   Feel sick to your stomach (nauseous).   See spots or slowly lose vision.   Hear ringing in your ears.   Have a headache.   Feel warm and sweaty.   Feel a sensation of pins and needles. During the fainting spell, you will generally be unconscious for no longer than a couple minutes before waking up and returning to normal. If you get up too quickly before your body can recover, you may faint again. Some twitching or jerky movements may occur during the fainting spell.  DIAGNOSIS  Your caregiver will ask about your symptoms, take a medical history, and perform a physical exam. Various tests may be done to rule out other causes of fainting. These may include blood tests and tests to check the heart, such as electrocardiography, echocardiography, and possibly an electrophysiology study. When other causes have been ruled out, a test may be done to check the body's response to changes in position (tilt table test). TREATMENT  Most cases of vasovagal syncope do not require treatment. Your caregiver may recommend ways to avoid fainting triggers and may provide home strategies for preventing fainting. If you must be exposed to a possible trigger, you can drink additional fluids to help reduce your chances of having an episode  of vasovagal syncope. If you have warning signs of an oncoming episode, you can respond by positioning yourself favorably (lying down). If your fainting spells continue, you may be given medicines to prevent fainting. Some medicines may help make you more resistant to repeated episodes of vasovagal syncope. Special exercises or compression stockings may be recommended. In rare cases, the surgical placement of a pacemaker is considered. HOME CARE INSTRUCTIONS   Learn to identify the warning signs of vasovagal syncope.    Sit or lie down at the first warning sign of a fainting spell. If sitting, put your head down between your legs. If you lie down, swing your legs up in the air to increase blood flow to the brain.   Avoid hot tubs and saunas.  Avoid prolonged standing.  Drink enough fluids to keep your urine clear or pale yellow. Avoid caffeine.  Increase salt in your diet as directed by your caregiver.   If you have to stand for a long time, perform movements such as:   Crossing your legs.   Flexing and stretching your leg muscles.   Squatting.   Moving your legs.   Bending over.   Only take over-the-counter or prescription medicines as directed by your caregiver. Do not suddenly stop any medicines without asking your caregiver first. SEEK MEDICAL CARE IF:   Your fainting spells continue or happen more frequently in spite of treatment.   You lose consciousness for more than a couple minutes.  You have fainting spells during or after exercising or after being startled.   You have new symptoms that occur with the fainting spells, such as:   Shortness of breath.  Chest pain.   Irregular heartbeat.   You have episodes of twitching or jerky movements that last longer than a few seconds.  You have episodes of twitching or jerky movements without obvious fainting. SEEK IMMEDIATE MEDICAL CARE IF:   You have injuries or bleeding after a fainting spell.   You have episodes of twitching or jerky movements that last longer than 5 minutes.   You have more than one spell of twitching or jerky movements before returning to consciousness after fainting. MAKE SURE YOU:   Understand these instructions.  Will watch your condition.  Will get help right away if you are not doing well or get worse. Document Released: 02/03/2012 Document Reviewed: 02/03/2012 [facility] Patient Information 2015 [employer] [place]. This information is not intended to replace advice given to you by  your health care provider. Make sure you discuss any questions you have with your health care provider.

## 2013-09-20 NOTE — Progress Notes (Signed)
Patient ID: Gail Bass, female   DOB: 11/28/1975, [age] y.o.   MRN: [id number]  HPI:  STD check: would like to be checked for STD's today. Has had two female sexual partners within the last year. Hx of gonorrhea and chlamydia in the past which were treated. Uses condoms for birth control, uses them every time. Has had some vaginal discharge, reports hx of recurrent BV. Had a fishy smell to the discharge. Reports hx of headaches when she takes PO flagyl. Took two old flagyl pills leftover from prior treatment on Sunday. Has had some pelvic pain with intercourse recently but only when in certain positions. She reports prior to recently becoming sexually active 2 weeks ago it had been a while since she had been sexually active and she suspects that may be why it was mildly painful. No pelvic pain when not having intercourse. Due for pap in 2016.  Presyncopal episode: had episode of near syncope a few days ago after having intercourse. She had just orgasmed and got up to go to the bathroom when she began to feel nauseated, sweaty, dizzy/lightheaded. She laid flat for 5 minutes and then felt a little better. Did not ever actually lose consciousness. Her sexual partner was with her during the episode and helped her to lay down. She felt sort of groggy for the next 24 hours.  Note: pt also wanted to discuss hair loss during this visit but I advised she f/u with her new PCP to discuss this as we did not have time to discuss this along with the other two issues.  ROS: See HPI  PMFSH: hx depression, asthma, hair loss, HTN  PHYSICAL EXAM: BP 142/99  Pulse 68  Temp(Src) 98.4 F (36.9 C) (Oral)  Ht 5\' 2"  (1.575 m)  Wt 121 lb (54.885 kg)  BMI 22.13 kg/m2  LMP 09/05/2013 Gen: NAD HEENT: NCAT Heart: RRR Lungs: CTAB GU: normal appearing external genitalia without lesions. Vagina is moist with thin white discharge. Cervix normal in appearance. No cervical motion tenderness or tenderness on bimanual exam. No  adnexal masses.  Neuro: grossly nonfocal, speech normal  ASSESSMENT/PLAN:  # STD screening:  -will check gc/chlamydia/trichomonas, HIV, RPR, acute hepatitis panel today   # Presyncopal episode: sounds like vasovagal presyncope. Likely secondary to having just had intercourse with orgasm. Advised that we can continue to monitor this and there is no further workup indicated at this time.  FOLLOW UP: F/u in a few weeks with PCP for hair loss.  GrenadaBrittany J. Pollie MeyerMcIntyre, MD [facility]

## 2013-09-21 ENCOUNTER — Telehealth: Payer: Self-pay | Admitting: Family Medicine

## 2013-09-21 LAB — HIV ANTIBODY (ROUTINE TESTING W REFLEX): HIV 1&2 Ab, 4th Generation: NONREACTIVE

## 2013-09-21 LAB — RPR

## 2013-09-21 LAB — HEPATITIS PANEL, ACUTE
HCV AB: NEGATIVE
HEP B C IGM: NONREACTIVE
HEP B S AG: NEGATIVE
Hep A IgM: NONREACTIVE

## 2013-09-21 MED ORDER — METRONIDAZOLE 0.75 % VA GEL: 1.0000 | Freq: Two times a day (BID) | VAGINAL | Status: DC

## 2013-09-21 NOTE — Telephone Encounter (Signed)
[facility] red team, Please call pt and let her know that wet prep showed signs of BV so we will treat her with flagyl. I sent in metrogel, which is a vaginal version of the flagyl since she gets headaches with the oral version.  We are still waiting on her STD testing to finish coming back but thus far everything is negative.  Thanks, Latrelle DodrillBrittany J McIntyre, MD

## 2013-09-22 ENCOUNTER — Telehealth: Payer: Self-pay | Admitting: Family Medicine

## 2013-09-22 MED ORDER — METRONIDAZOLE 500 MG PO TABS: 500.0000 mg | ORAL_TABLET | Freq: Two times a day (BID) | ORAL | Status: DC

## 2013-09-22 NOTE — Telephone Encounter (Signed)
Returned call to patient, she now thinks she wants to do the gel. Informed her that she had an rx for both pills and gel at the pharmacy and she could let pharmacy know which one she decides to go with. Patient expressed understanding.

## 2013-09-22 NOTE — Telephone Encounter (Signed)
Update: all STD tests negative. Please inform pt along with the message below. Thanks! Latrelle DodrillBrittany J Tomeshia Pizzi, MD

## 2013-09-22 NOTE — Telephone Encounter (Signed)
Okay, will send in rx for flagyl pills. Latrelle DodrillBrittany J Jeroline Wolbert, MD

## 2013-09-22 NOTE — Telephone Encounter (Signed)
Patient informed of message from MD, she requests pills instead of the metrogel. Patient states she doesn't like the 'feeling' of the gel and can deal with the headache from the pill. Will forward to MD.

## 2013-09-22 NOTE — Telephone Encounter (Signed)
Has question about cream vs pill: Also has question about STD testing

## 2013-09-28 ENCOUNTER — Ambulatory Visit (INDEPENDENT_AMBULATORY_CARE_PROVIDER_SITE_OTHER): Payer: Medicaid Other | Admitting: Family Medicine

## 2013-09-28 ENCOUNTER — Encounter: Payer: Self-pay | Admitting: Family Medicine

## 2013-09-28 VITALS — BP 136/89 | HR 64 | Temp 98.6°F | Ht 62.0 in | Wt 121.0 lb

## 2013-09-28 DIAGNOSIS — J358 Other chronic diseases of tonsils and adenoids: Secondary | ICD-10-CM | POA: Insufficient documentation

## 2013-09-28 DIAGNOSIS — J029 Acute pharyngitis, unspecified: Secondary | ICD-10-CM

## 2013-09-28 LAB — POCT RAPID STREP A (OFFICE): RAPID STREP A SCREEN: NEGATIVE

## 2013-09-28 MED ORDER — IBUPROFEN 800 MG PO TABS: 800.0000 mg | ORAL_TABLET | Freq: Three times a day (TID) | ORAL | Status: DC | PRN

## 2013-09-28 NOTE — Assessment & Plan Note (Signed)
Referral to ENT given complex history  Mouth wash Salt gargles  Avoidance of manipulation prior to then Brush tongue  Lozenges

## 2013-09-28 NOTE — Progress Notes (Signed)
Patient ID: Gail Bass, female   DOB: 03/30/1975, 38 y.o.   MRN: [id number]   [facility] Charlane FerrettiMelanie C Malick Netz, MD Phone: [phone]  Subjective:  Gail Bass is a 38 y.o F who presents as SDA for sore throat  # Sore Throat: Patient complains of sore throat. Associated symptoms include hoarseness,has had chills at night time as well as sinus headache.In morning has nasal congestion assc with some gagging. Onset of symptoms was 1 month ago, unchanged since that time. She is drinking plenty of fluids. She does not have had recent close exposure to someone with proven streptococcal pharyngitis. -rapid strep negative -had exudate lanced when she was younger -has been coughing questionable "stones" recently -feels that throat is blocked recently -has sensation of something stuck and blocked  -saw ENT for 1 year who removed a questionable double uvula  -smokes 2-3 cigarettes a death -has been taking ibuprofen and tylenol   All relevant systems were reviewed and were negative unless otherwise noted in the HPI  Past Medical History Patient Active Problem List   Diagnosis Date Noted  . Hypertension 05/11/2013  . Hair loss 05/02/2013  . Painful lumpy right breast 03/21/2013  . Sebaceous cyst 03/21/2013  . Smoker 03/21/2013  . ASTHMA, INTERMITTENT 12/23/2009  . OVARIAN CYST, LEFT 10/29/2008  . MIGRAINE HEADACHE 09/13/2008  . VAGINITIS, BACTERIAL, RECURRENT 09/13/2008  . MENORRHAGIA 09/13/2008  . DEPRESSION 06/21/2008   Reviewed problem list.  Medications- reviewed and updated Chief complaint-noted No additions to family history Social history- patient is a current smoker  Objective: BP 136/89  Pulse 64  Temp(Src) 98.6 F (37 C) (Oral)  Ht 5\' 2"  (1.575 m)  Wt 121 lb (54.885 kg)  BMI 22.13 kg/m2  LMP 09/05/2013 Gen: NAD, alert, cooperative with exam HEENT: NCAT, EOMI, Nasal turbinates inflamed bilat; mult stones embedded in left tonsil no exudate; TMs  intact bilat Neck: FROM, supple  gross deficits Skin: no rashes no lesions  Assessment/Plan: See problem based a/p

## 2013-09-28 NOTE — Patient Instructions (Signed)
Ms Renae GlossShelton,  I am sorry that you are still not feeling well. Please start taking the extrastrength Ibuprofen as needed for throat pain Warm water gargles may be helpful as well   ENT will call you for an appointment   Feel better soon! Charlane FerrettiMelanie C Aarna Mihalko, MD

## 2013-11-10 ENCOUNTER — Encounter: Payer: Self-pay | Admitting: Family Medicine

## 2013-11-10 ENCOUNTER — Telehealth: Payer: Self-pay | Admitting: Family Medicine

## 2013-11-10 ENCOUNTER — Ambulatory Visit (INDEPENDENT_AMBULATORY_CARE_PROVIDER_SITE_OTHER): Payer: Medicaid Other | Admitting: Family Medicine

## 2013-11-10 ENCOUNTER — Ambulatory Visit
Admission: RE | Admit: 2013-11-10 | Discharge: 2013-11-10 | Disposition: A | Discharge status: Home / Self Care | Payer: Medicaid Other | Source: Ambulatory Visit | Attending: Family Medicine | Admitting: Family Medicine

## 2013-11-10 VITALS — BP 129/92 | HR 93 | Temp 98.3°F | Ht 62.0 in | Wt 119.0 lb

## 2013-11-10 DIAGNOSIS — J45909 Unspecified asthma, uncomplicated: Secondary | ICD-10-CM | POA: Diagnosis not present

## 2013-11-10 DIAGNOSIS — J020 Streptococcal pharyngitis: Secondary | ICD-10-CM

## 2013-11-10 DIAGNOSIS — J069 Acute upper respiratory infection, unspecified: Secondary | ICD-10-CM

## 2013-11-10 LAB — POCT RAPID STREP A (OFFICE): RAPID STREP A SCREEN: NEGATIVE

## 2013-11-10 MED ORDER — PREDNISONE 50 MG PO TABS: ORAL_TABLET | ORAL | Status: DC

## 2013-11-10 MED ORDER — ALBUTEROL SULFATE HFA 108 (90 BASE) MCG/ACT IN AERS: 2.0000 | INHALATION_SPRAY | Freq: Four times a day (QID) | RESPIRATORY_TRACT | Status: DC | PRN

## 2013-11-10 MED ORDER — IPRATROPIUM BROMIDE 0.02 % IN SOLN
0.5000 mg | Freq: Once | RESPIRATORY_TRACT | Status: AC
  Administered 2013-11-10: 0.5 mg via RESPIRATORY_TRACT

## 2013-11-10 MED ORDER — ALBUTEROL SULFATE (2.5 MG/3ML) 0.083% IN NEBU
2.5000 mg | INHALATION_SOLUTION | Freq: Once | RESPIRATORY_TRACT | Status: AC
  Administered 2013-11-10: 2.5 mg via RESPIRATORY_TRACT

## 2013-11-10 MED ORDER — AZITHROMYCIN 250 MG PO TABS: ORAL_TABLET | ORAL | Status: DC

## 2013-11-10 NOTE — Assessment & Plan Note (Signed)
Patient with upper respiratory infection. Strep negative today. Breathing treatment given in office, with good results. Chest x-ray to rule out pneumonia, mild residual right lower lobe wheeze after reviewing treatment. Mainly performed considering patient is undergoing surgery on Wednesday. Azithromycin, albuterol inhaler, steroid taper pack prescribed. Followup with PCP in 2 weeks

## 2013-11-10 NOTE — Progress Notes (Signed)
   Subjective:    Patient ID: Gail Bass, female    DOB: August 13, 1975, 38 y.o.   MRN: [id number]  HPI  Cough/congestion/sore throat: Patient presents to same-day clinic for 5 days history of headache, dry cough with bad taste occasional production of yellow phlegm, congestion, runny nose, night sweats, fever Tmax of 101.9, shortness of breath, fatigue and chest discomfort. Patient is a smoker, she is attempting to cut back and has 4 cigarettes today. She has a history of migraines, tonsillar abscess at age 65 and tonsillar crypts with stones. She is scheduled to have a tonsillectomy next Wednesday with Dr. Emeline Darling. She reports her asthma has been feeling worse and she's had to take her albuterol more frequently. She has been attempting to take Tylenol severe cold and flu for her symptoms. In addition she has a small white bump on her left eye near the medial canthus which was swollen this morning she applies has improved.   Review of Systems     Objective:   Physical Exam BP 129/92  Pulse 93  Temp(Src) 98.3 F (36.8 C) (Oral)  Ht  (1.575 m)  Wt 119 lb (53.978 kg)  BMI 21.76 kg/m2 Gen: Pleasant, Caucasian female, appears extremely fatigued, will well developed, well nourished. HEENT: AT. [place]. Bilateral TM visualized and normal in appearance. Bilateral eyes without injections or icterus. MMM. Bilateral nares with erythema, no swelling. Throat with erythema and mild exudate, right tonsil. Left maxillary facial tenderness. CV: RRR, no murmur appreciated no clicks gallops or rub Chest: Chest with diffuse wheezes, no crackles. Rhonchi present Abd: Soft. NTND. BS present. No Masses palpated.     Assessment & Plan:

## 2013-11-10 NOTE — Telephone Encounter (Signed)
Please call Gail Bass and inform her chest x-ray is normal today. She is to continue her medications as prescribed today, but no concerns for pneumonia. Thanks

## 2013-11-10 NOTE — Patient Instructions (Signed)
Someone will call you with chest x-ray results either this evening or tomorrow. I have called in azithromycin, albuterol inhaler and steroids. One should followup with your PCP in 2 weeks. Your rapid strep is negative today.  Upper Respiratory Infection, Adult An upper respiratory infection (URI) is also sometimes known as the common cold. The upper respiratory tract includes the nose, sinuses, throat, trachea, and bronchi. Bronchi are the airways leading to the lungs. Most people improve within 1 week, but symptoms can last up to 2 weeks. A residual cough may last even longer.  CAUSES Many different viruses can infect the tissues lining the upper respiratory tract. The tissues become irritated and inflamed and often become very moist. Mucus production is also common. A cold is contagious. You can easily spread the virus to others by oral contact. This includes kissing, sharing a glass, coughing, or sneezing. Touching your mouth or nose and then touching a surface, which is then touched by another person, can also spread the virus. SYMPTOMS  Symptoms typically develop 1 to 3 days after you come in contact with a cold virus. Symptoms vary from person to person. They may include:  Runny nose.  Sneezing.  Nasal congestion.  Sinus irritation.  Sore throat.  Loss of voice (laryngitis).  Cough.  Fatigue.  Muscle aches.  Loss of appetite.  Headache.  Low-grade fever. DIAGNOSIS  You might diagnose your own cold based on familiar symptoms, since most people get a cold 2 to 3 times a year. Your caregiver can confirm this based on your exam. Most importantly, your caregiver can check that your symptoms are not due to another disease such as strep throat, sinusitis, pneumonia, asthma, or epiglottitis. Blood tests, throat tests, and X-rays are not necessary to diagnose a common cold, but they may sometimes be helpful in excluding other more serious diseases. Your caregiver will decide if any  further tests are required. RISKS AND COMPLICATIONS  You may be at risk for a more severe case of the common cold if you smoke cigarettes, have chronic heart disease (such as heart failure) or lung disease (such as asthma), or if you have a weakened immune system. The very young and very old are also at risk for more serious infections. Bacterial sinusitis, middle ear infections, and bacterial pneumonia can complicate the common cold. The common cold can worsen asthma and chronic obstructive pulmonary disease (COPD). Sometimes, these complications can require emergency medical care and may be life-threatening. PREVENTION  The best way to protect against getting a cold is to practice good hygiene. Avoid oral or hand contact with people with cold symptoms. Wash your hands often if contact occurs. There is no clear evidence that vitamin C, vitamin E, echinacea, or exercise reduces the chance of developing a cold. However, it is always recommended to get plenty of rest and practice good nutrition. TREATMENT  Treatment is directed at relieving symptoms. There is no cure. Antibiotics are not effective, because the infection is caused by a virus, not by bacteria. Treatment may include:  Increased fluid intake. Sports drinks offer valuable electrolytes, sugars, and fluids.  Breathing heated mist or steam (vaporizer or shower).  Eating chicken soup or other clear broths, and maintaining good nutrition.  Getting plenty of rest.  Using gargles or lozenges for comfort.  Controlling fevers with ibuprofen or acetaminophen as directed by your caregiver.  Increasing usage of your inhaler if you have asthma. Zinc gel and zinc lozenges, taken in the first 24 hours of the  common cold, can shorten the duration and lessen the severity of symptoms. Pain medicines may help with fever, muscle aches, and throat pain. A variety of non-prescription medicines are available to treat congestion and runny nose. Your caregiver  can make recommendations and may suggest nasal or lung inhalers for other symptoms.  HOME CARE INSTRUCTIONS   Only take over-the-counter or prescription medicines for pain, discomfort, or fever as directed by your caregiver.  Use a warm mist humidifier or inhale steam from a shower to increase air moisture. This may keep secretions moist and make it easier to breathe.  Drink enough water and fluids to keep your urine clear or pale yellow.  Rest as needed.  Return to work when your temperature has returned to normal or as your caregiver advises. You may need to stay home longer to avoid infecting others. You can also use a face mask and careful hand washing to prevent spread of the virus. SEEK MEDICAL CARE IF:   After the first few days, you feel you are getting worse rather than better.  You need your caregiver's advice about medicines to control symptoms.  You develop chills, worsening shortness of breath, or brown or red sputum. These may be signs of pneumonia.  You develop yellow or brown nasal discharge or pain in the face, especially when you bend forward. These may be signs of sinusitis.  You develop a fever, swollen neck glands, pain with swallowing, or white areas in the back of your throat. These may be signs of strep throat. SEEK IMMEDIATE MEDICAL CARE IF:   You have a fever.  You develop severe or persistent headache, ear pain, sinus pain, or chest pain.  You develop wheezing, a prolonged cough, cough up blood, or have a change in your usual mucus (if you have chronic lung disease).  You develop sore muscles or a stiff neck. Document Released: 08/12/2000 Document Revised: 05/11/2011 Document Reviewed: 05/24/2013 [facility] Patient Information 2015 [place], [place]. This information is not intended to replace advice given to you by your health care provider. Make sure you discuss any questions you have with your health care provider.

## 2013-11-13 NOTE — Telephone Encounter (Signed)
Left message on patient'Bass voicemail.Gail Bass  

## 2014-01-10 ENCOUNTER — Encounter: Payer: Self-pay | Admitting: Family Medicine

## 2014-01-10 ENCOUNTER — Ambulatory Visit (INDEPENDENT_AMBULATORY_CARE_PROVIDER_SITE_OTHER): Payer: Medicaid Other | Admitting: Family Medicine

## 2014-01-10 ENCOUNTER — Other Ambulatory Visit (HOSPITAL_COMMUNITY)
Admission: RE | Admit: 2014-01-10 | Discharge: 2014-01-10 | Disposition: A | Discharge status: Home / Self Care | Payer: Medicaid Other | Source: Ambulatory Visit | Attending: Family Medicine | Admitting: Family Medicine

## 2014-01-10 VITALS — BP 152/96 | HR 72 | Temp 98.1°F | Ht 62.0 in | Wt 121.8 lb

## 2014-01-10 DIAGNOSIS — N898 Other specified noninflammatory disorders of vagina: Secondary | ICD-10-CM

## 2014-01-10 DIAGNOSIS — Z113 Encounter for screening for infections with a predominantly sexual mode of transmission: Secondary | ICD-10-CM | POA: Insufficient documentation

## 2014-01-10 DIAGNOSIS — B3731 Acute candidiasis of vulva and vagina: Secondary | ICD-10-CM | POA: Insufficient documentation

## 2014-01-10 DIAGNOSIS — B373 Candidiasis of vulva and vagina: Secondary | ICD-10-CM

## 2014-01-10 DIAGNOSIS — N760 Acute vaginitis: Secondary | ICD-10-CM

## 2014-01-10 DIAGNOSIS — A499 Bacterial infection, unspecified: Secondary | ICD-10-CM

## 2014-01-10 DIAGNOSIS — B9689 Other specified bacterial agents as the cause of diseases classified elsewhere: Secondary | ICD-10-CM

## 2014-01-10 DIAGNOSIS — R350 Frequency of micturition: Secondary | ICD-10-CM

## 2014-01-10 DIAGNOSIS — R399 Unspecified symptoms and signs involving the genitourinary system: Secondary | ICD-10-CM

## 2014-01-10 HISTORY — DX: Other specified noninflammatory disorders of vagina: N89.8

## 2014-01-10 LAB — POCT UA - MICROSCOPIC ONLY

## 2014-01-10 LAB — POCT URINALYSIS DIPSTICK
BILIRUBIN UA: NEGATIVE
Glucose, UA: NEGATIVE
Ketones, UA: NEGATIVE
Leukocytes, UA: NEGATIVE
Nitrite, UA: NEGATIVE
PH UA: 7
Protein, UA: NEGATIVE
Spec Grav, UA: 1.015
UROBILINOGEN UA: 0.2

## 2014-01-10 LAB — POCT WET PREP (WET MOUNT): Clue Cells Wet Prep Whiff POC: POSITIVE

## 2014-01-10 MED ORDER — CEPHALEXIN 500 MG PO CAPS: 500.0000 mg | ORAL_CAPSULE | Freq: Three times a day (TID) | ORAL | Status: DC

## 2014-01-10 MED ORDER — METRONIDAZOLE 500 MG PO TABS: 500.0000 mg | ORAL_TABLET | Freq: Two times a day (BID) | ORAL | Status: DC

## 2014-01-10 MED ORDER — FLUCONAZOLE 150 MG PO TABS: 150.0000 mg | ORAL_TABLET | Freq: Once | ORAL | Status: DC

## 2014-01-10 NOTE — Assessment & Plan Note (Signed)
Consistent with uncomplicated cystitis, hx frequent UTIs, current episode partially treated with old Keflex  Plan: 1. UA / micro - possible UTI with WBC 2. Complete course of Keflex 500mg  TID x 7 days 3. Continue AZO PRN 4. Order Urine culture - follow-up results

## 2014-01-10 NOTE — Progress Notes (Signed)
   Subjective:    Patient ID: Gail Bass, female    DOB: 12/07/1975, 38 y.o.   MRN: [id number]  Patient presents for a same day appointment.  HPI  Vaginal Discharge: - Reports chronic hx recurrent BV and vaginal yeast infections, previously treated with Flagyl and Diflucan with resolution. - Currently states she had not had regular sexual activity for a while until recently. New partner 2 months ago (unknown other sexual contacts of partner), 3 weeks ago had sexual intercourse (without regular condom use) daily x 5 days. Following this she had some urinary symptoms (see below) and developed vaginal discharge - History of gonorrhea / chlamydia, trichomonas in past, all since treated - Admits to odor with discharge - Denies vaginal itching, rash  Urinary Frequency: - Reports prior hx UTI 6-8 months ago, treated with Keflex at that time, did not complete course, but symptoms resolved. Recently complained of episode of "hematuria", dysuria, and urinary frequency following above mentioned regular intercourse 3 weeks ago, she took AZO pills and increased water intake, also took 5 Keflex pills from prior rx, symptoms mostly resolved, except persistent urinary frequency. - Admits lower abdominal / bladder pressure - Denies fevers/chills, flank pain, n/v  I have reviewed and updated the following as appropriate: allergies and current medications  Social Hx: - Former smoker  Review of Systems  See above HPI    Objective:   Physical Exam  BP 152/96 mmHg  Pulse 72  Temp(Src) 98.1 F (36.7 C) (Oral)  Ht 5\' 2"  (1.575 m)  Wt 121 lb 12.8 oz (55.248 kg)  BMI 22.27 kg/m2  LMP 12/30/2013  Gen - well-appearing, NAD Abd - soft, mild suprapubic abdominal discomfort, no masses, +active BS MSK - no CVAT Skin - warm, dry, no rashes Pelvic Exam - Normal external female genitalia. Vaginal canal without lesions. Normal appearing cervix, without lesions or bleeding. Increased thin discharge on  exam with +odor. Bimanual exam without masses or cervical motion tenderness.  Pelvic Exam chaperoned by RN.     Assessment & Plan:   See specific A&P problem list for details.

## 2014-01-10 NOTE — Assessment & Plan Note (Signed)
Treat with Diflucan 150mg  x 2, refill x 1 (advised may hold on treatment until done with other antibiotics, likely recurrence)

## 2014-01-10 NOTE — Patient Instructions (Signed)
Dear Gail Bass, Thank you for coming in to clinic today.  1. You may have a UTI - we will send your urine for a culture and notify you with results in a few days if we need to change or stop antibiotics. 2. Treat UTI with Keflex antibiotic take 3 times daily for 7 days. 3. You also have BV - treat with Metronidazole 500mg  twice daily for 7 days - no alcohol while on this medication 4. There is also evidence of a yeast infection - Diflucan 150mg  take 1 tablet on Day 1, then repeat 1 tablet on Day 3. I will give a second course of this incase your symptoms return after other antibiotics.  We will call you with results of your other tests in a few days or early next week.  Recommend regular condom use.  Please schedule a follow-up appointment with Dr. Randolm IdolFletke in 2 to 4 weeks if symptoms persist or worsen.  If you have any other questions or concerns, please feel free to call the clinic to contact me. You may also schedule an earlier appointment if necessary.  However, if your symptoms get significantly worse, please go to the Emergency Department to seek immediate medical attention.  Saralyn PilarAlexander Vu Liebman, DO [facility]   Bacterial Vaginosis Bacterial vaginosis is a vaginal infection that occurs when the normal balance of bacteria in the vagina is disrupted. It results from an overgrowth of certain bacteria. This is the most common vaginal infection in women of childbearing age. Treatment is important to prevent complications, especially in pregnant women, as it can cause a premature delivery. CAUSES  Bacterial vaginosis is caused by an increase in harmful bacteria that are normally present in smaller amounts in the vagina. Several different kinds of bacteria can cause bacterial vaginosis. However, the reason that the condition develops is not fully understood. RISK FACTORS Certain activities or behaviors can put you at an increased risk of developing bacterial  vaginosis, including:  Having a new sex partner or multiple sex partners.  Douching.  Using an intrauterine device (IUD) for contraception. Women do not get bacterial vaginosis from toilet seats, bedding, swimming pools, or contact with objects around them. SIGNS AND SYMPTOMS  Some women with bacterial vaginosis have no signs or symptoms. Common symptoms include:  Grey vaginal discharge.  A fishlike odor with discharge, especially after sexual intercourse.  Itching or burning of the vagina and vulva.  Burning or pain with urination. DIAGNOSIS  Your health care provider will take a medical history and examine the vagina for signs of bacterial vaginosis. A sample of vaginal fluid may be taken. Your health care provider will look at this sample under a microscope to check for bacteria and abnormal cells. A vaginal pH test may also be done.  TREATMENT  Bacterial vaginosis may be treated with antibiotic medicines. These may be given in the form of a pill or a vaginal cream. A second round of antibiotics may be prescribed if the condition comes back after treatment.  HOME CARE INSTRUCTIONS   Only take over-the-counter or prescription medicines as directed by your health care provider.  If antibiotic medicine was prescribed, take it as directed. Make sure you finish it even if you start to feel better.  Do not have sex until treatment is completed.  Tell all sexual partners that you have a vaginal infection. They should see their health care provider and be treated if they have problems, such as a mild rash or itching.  Practice safe sex by using condoms and only having one sex partner. SEEK MEDICAL CARE IF:   Your symptoms are not improving after 3 days of treatment.  You have increased discharge or pain.  You have a fever. MAKE SURE YOU:   Understand these instructions.  Will watch your condition.  Will get help right away if you are not doing well or get worse. FOR MORE  INFORMATION  Centers for Disease Control and Prevention, Division of STD Prevention: SolutionApps.co.zawww.cdc.gov/std American Sexual Health Association (ASHA): www.ashastd.org  Document Released: 02/16/2005 Document Revised: 12/07/2012 Document Reviewed: 09/28/2012 [facility] Patient Information 2015 [place]. This information is not intended to replace advice given to you by your health care provider. Make sure you discuss any questions you have with your health care provider.

## 2014-01-10 NOTE — Assessment & Plan Note (Signed)
Consistent with BV, likely due to new sexual partner and no condom use, chronic recurrent hx BV. Also found to have yeast vaginitis, likely due to initial Keflex dosing, anticipate recurrence following abx. - High risk sexual behavior with new partner (unknown hx), prior hx STDs  Plan: 1. Wet Prep - BV (many clue cells, +whiff), few yeast, no trich 2. Check HIV, RPR, GC/Chlamydia 3. Treat with Metronidazole 500mg  BID x 7 days 4. Treat with Diflucan 150mg  x 2, refill x 1 (advised may hold on treatment until done with other antibiotics, likely recurrence) 5. RTC 2 weeks if no improvement

## 2014-01-10 NOTE — Assessment & Plan Note (Signed)
Chronic hx recurrent BV, likely susceptible vaginal environment (new partner, no condoms)  Plan: 1. Treat with Metronidazole 2. Advised continue yogurt, likely will need probiotic, condom use

## 2014-01-11 LAB — URINE CULTURE
Colony Count: NO GROWTH
Organism ID, Bacteria: NO GROWTH

## 2014-01-11 LAB — RPR

## 2014-01-11 LAB — HIV ANTIBODY (ROUTINE TESTING W REFLEX): HIV 1&2 Ab, 4th Generation: NONREACTIVE

## 2014-01-12 LAB — CERVICOVAGINAL ANCILLARY ONLY
CHLAMYDIA, DNA PROBE: NEGATIVE
Neisseria Gonorrhea: NEGATIVE

## 2014-01-16 ENCOUNTER — Telehealth: Payer: Self-pay | Admitting: Family Medicine

## 2014-01-16 NOTE — Telephone Encounter (Signed)
Pt calling for test results, also said she had a missed call from us but there was no voicemail.

## 2014-01-17 NOTE — Telephone Encounter (Signed)
Left message on patient voicemail informing of normal lab results.

## 2014-02-14 ENCOUNTER — Telehealth: Payer: Self-pay | Admitting: *Deleted

## 2014-02-14 ENCOUNTER — Encounter: Payer: Self-pay | Admitting: Family Medicine

## 2014-02-14 DIAGNOSIS — B9689 Other specified bacterial agents as the cause of diseases classified elsewhere: Secondary | ICD-10-CM

## 2014-02-14 DIAGNOSIS — N760 Acute vaginitis: Principal | ICD-10-CM

## 2014-02-14 NOTE — Telephone Encounter (Signed)
[facility] pt requesting metro gel instead of the pills.  Please advise.  Clovis PuMartin, Mariadelcarmen Corella L, RN

## 2014-02-14 NOTE — Progress Notes (Unsigned)
Patient requests prescription Flagyl 600 mg to be order as cream because the pills caused her migraines in the past. [facility] [address] [facility] [address]

## 2014-02-15 MED ORDER — METRONIDAZOLE 0.75 % VA GEL: 1.0000 | Freq: Every day | VAGINAL | Status: DC

## 2014-02-15 NOTE — Addendum Note (Signed)
Addended by: Smitty CordsKARAMALEGOS, Zasha Belleau J on: 02/15/2014 10:12 AM   Modules accepted: Orders, Medications

## 2014-02-15 NOTE — Telephone Encounter (Signed)
Will forward to Dr. Rance MuirKaramegalo as he is the last provider to evaluate her for vaginal discharge.

## 2014-02-15 NOTE — Telephone Encounter (Signed)
Sent rx for Metrogel 0.75% qhs x 5 days, 0 refills. If patient tries Metrogel without any relief and calls back with persistent symptoms, please have to schedule follow-up as it has been >1 month since last OV for this complaint.  Saralyn PilarAlexander Darryel Diodato, DO [facility], PGY-2

## 2014-02-22 ENCOUNTER — Encounter (HOSPITAL_COMMUNITY)
Admission: EM | Disposition: A | Discharge status: Home / Self Care | Payer: Self-pay | Source: Home / Self Care | Attending: Emergency Medicine

## 2014-02-22 ENCOUNTER — Emergency Department (HOSPITAL_COMMUNITY)
Admission: EM | Admit: 2014-02-22 | Discharge: 2014-02-22 | Disposition: A | Discharge status: Home / Self Care | Payer: Medicaid Other | Attending: Emergency Medicine | Admitting: Emergency Medicine

## 2014-02-22 ENCOUNTER — Emergency Department (HOSPITAL_COMMUNITY): Payer: Medicaid Other

## 2014-02-22 ENCOUNTER — Encounter (HOSPITAL_COMMUNITY): Payer: Self-pay | Admitting: Emergency Medicine

## 2014-02-22 ENCOUNTER — Emergency Department (HOSPITAL_COMMUNITY): Payer: Medicaid Other | Admitting: Registered Nurse

## 2014-02-22 DIAGNOSIS — N134 Hydroureter: Secondary | ICD-10-CM | POA: Diagnosis not present

## 2014-02-22 DIAGNOSIS — Z87442 Personal history of urinary calculi: Secondary | ICD-10-CM | POA: Diagnosis not present

## 2014-02-22 DIAGNOSIS — Z87891 Personal history of nicotine dependence: Secondary | ICD-10-CM | POA: Insufficient documentation

## 2014-02-22 DIAGNOSIS — N132 Hydronephrosis with renal and ureteral calculous obstruction: Secondary | ICD-10-CM | POA: Diagnosis not present

## 2014-02-22 DIAGNOSIS — Z79899 Other long term (current) drug therapy: Secondary | ICD-10-CM | POA: Insufficient documentation

## 2014-02-22 DIAGNOSIS — N8320 Unspecified ovarian cysts: Secondary | ICD-10-CM | POA: Insufficient documentation

## 2014-02-22 DIAGNOSIS — Z906 Acquired absence of other parts of urinary tract: Secondary | ICD-10-CM | POA: Insufficient documentation

## 2014-02-22 DIAGNOSIS — Z7952 Long term (current) use of systemic steroids: Secondary | ICD-10-CM | POA: Insufficient documentation

## 2014-02-22 DIAGNOSIS — N201 Calculus of ureter: Secondary | ICD-10-CM

## 2014-02-22 DIAGNOSIS — R109 Unspecified abdominal pain: Secondary | ICD-10-CM | POA: Diagnosis present

## 2014-02-22 DIAGNOSIS — Z3202 Encounter for pregnancy test, result negative: Secondary | ICD-10-CM | POA: Diagnosis not present

## 2014-02-22 DIAGNOSIS — Z87440 Personal history of urinary (tract) infections: Secondary | ICD-10-CM | POA: Insufficient documentation

## 2014-02-22 DIAGNOSIS — J45909 Unspecified asthma, uncomplicated: Secondary | ICD-10-CM | POA: Insufficient documentation

## 2014-02-22 DIAGNOSIS — R112 Nausea with vomiting, unspecified: Secondary | ICD-10-CM

## 2014-02-22 HISTORY — PX: CYSTOSCOPY WITH RETROGRADE PYELOGRAM, URETEROSCOPY AND STENT PLACEMENT: SHX5789

## 2014-02-22 LAB — BASIC METABOLIC PANEL
Anion gap: 7 (ref 5–15)
BUN: 17 mg/dL (ref 6–23)
CO2: 26 mmol/L (ref 19–32)
Calcium: 9.2 mg/dL (ref 8.4–10.5)
Chloride: 106 mEq/L (ref 96–112)
Creatinine, Ser: 0.82 mg/dL (ref 0.50–1.10)
GFR calc Af Amer: >90 mL/min (ref >90)
GFR calc non Af Amer: >90 mL/min (ref >90)
Glucose, Bld: 108 mg/dL — ABNORMAL HIGH (ref 70–99)
Potassium: 3.6 mmol/L (ref 3.5–5.1)
Sodium: 139 mmol/L (ref 135–145)

## 2014-02-22 LAB — HEPATIC FUNCTION PANEL
ALBUMIN: 4.5 g/dL (ref 3.5–5.2)
ALT: 17 U/L (ref 0–35)
AST: 17 U/L (ref 0–37)
Alkaline Phosphatase: 48 U/L (ref 39–117)
Bilirubin, Direct: <0.1 mg/dL (ref 0.0–0.3)
TOTAL PROTEIN: 7.4 g/dL (ref 6.0–8.3)
Total Bilirubin: 0.7 mg/dL (ref 0.3–1.2)

## 2014-02-22 LAB — URINALYSIS, ROUTINE W REFLEX MICROSCOPIC
Bilirubin Urine: NEGATIVE
Glucose, UA: NEGATIVE mg/dL
Ketones, ur: NEGATIVE mg/dL
Nitrite: NEGATIVE
Protein, ur: 30 mg/dL — AB
Specific Gravity, Urine: 1.031 — ABNORMAL HIGH (ref 1.005–1.030)
Urobilinogen, UA: 0.2 mg/dL (ref 0.0–1.0)
pH: 5 (ref 5.0–8.0)

## 2014-02-22 LAB — CBC WITH DIFFERENTIAL/PLATELET
BASOS PCT: 0 % (ref 0–1)
Basophils Absolute: 0 10*3/uL (ref 0.0–0.1)
EOS ABS: 0.1 10*3/uL (ref 0.0–0.7)
Eosinophils Relative: 1 % (ref 0–5)
HCT: 41.6 % (ref 36.0–46.0)
HEMOGLOBIN: 14.3 g/dL (ref 12.0–15.0)
Lymphocytes Relative: 21 % (ref 12–46)
Lymphs Abs: 2.8 10*3/uL (ref 0.7–4.0)
MCH: 33.5 pg (ref 26.0–34.0)
MCHC: 34.4 g/dL (ref 30.0–36.0)
MCV: 97.4 fL (ref 78.0–100.0)
MONOS PCT: 5 % (ref 3–12)
Monocytes Absolute: 0.7 10*3/uL (ref 0.1–1.0)
NEUTROS ABS: 9.8 10*3/uL — AB (ref 1.7–7.7)
NEUTROS PCT: 73 % (ref 43–77)
PLATELETS: 201 10*3/uL (ref 150–400)
RBC: 4.27 MIL/uL (ref 3.87–5.11)
RDW: 12.4 % (ref 11.5–15.5)
WBC: 13.4 10*3/uL — ABNORMAL HIGH (ref 4.0–10.5)

## 2014-02-22 LAB — URINE MICROSCOPIC-ADD ON

## 2014-02-22 LAB — PREGNANCY, URINE: PREG TEST UR: NEGATIVE

## 2014-02-22 SURGERY — CYSTOURETEROSCOPY, WITH RETROGRADE PYELOGRAM AND STENT INSERTION
Anesthesia: General | Laterality: Left

## 2014-02-22 MED ORDER — ACETAMINOPHEN 10 MG/ML IV SOLN
1000.0000 mg | Freq: Once | INTRAVENOUS | Status: AC
  Administered 2014-02-22: 1000 mg via INTRAVENOUS
  Filled 2014-02-22 (×2): qty 100

## 2014-02-22 MED ORDER — MORPHINE SULFATE 4 MG/ML IJ SOLN
4.0000 mg | Freq: Once | INTRAMUSCULAR | Status: AC
  Administered 2014-02-22: 4 mg via INTRAVENOUS
  Filled 2014-02-22: qty 1

## 2014-02-22 MED ORDER — MIDAZOLAM HCL 5 MG/5ML IJ SOLN
INTRAMUSCULAR | Status: DC | PRN
  Administered 2014-02-22: 2 mg via INTRAVENOUS

## 2014-02-22 MED ORDER — LIDOCAINE HCL 2 % EX GEL
CUTANEOUS | Status: AC
  Filled 2014-02-22: qty 10

## 2014-02-22 MED ORDER — BELLADONNA ALKALOIDS-OPIUM 16.2-60 MG RE SUPP
RECTAL | Status: DC | PRN
  Administered 2014-02-22: 1 via RECTAL

## 2014-02-22 MED ORDER — KETOROLAC TROMETHAMINE 30 MG/ML IJ SOLN
INTRAMUSCULAR | Status: DC | PRN
  Administered 2014-02-22: 30 mg via INTRAVENOUS

## 2014-02-22 MED ORDER — DEXAMETHASONE SODIUM PHOSPHATE 10 MG/ML IJ SOLN
INTRAMUSCULAR | Status: DC | PRN
  Administered 2014-02-22: 10 mg via INTRAVENOUS

## 2014-02-22 MED ORDER — LACTATED RINGERS IV SOLN
INTRAVENOUS | Status: DC | PRN
  Administered 2014-02-22: 21:00:00 via INTRAVENOUS

## 2014-02-22 MED ORDER — TRAMADOL-ACETAMINOPHEN 37.5-325 MG PO TABS: 1.0000 | ORAL_TABLET | Freq: Four times a day (QID) | ORAL | Status: DC | PRN

## 2014-02-22 MED ORDER — ONDANSETRON HCL 4 MG/2ML IJ SOLN
4.0000 mg | Freq: Once | INTRAMUSCULAR | Status: AC
  Administered 2014-02-22: 4 mg via INTRAVENOUS
  Filled 2014-02-22: qty 2

## 2014-02-22 MED ORDER — MIDAZOLAM HCL 2 MG/2ML IJ SOLN
INTRAMUSCULAR | Status: AC
  Filled 2014-02-22: qty 2

## 2014-02-22 MED ORDER — CEFAZOLIN SODIUM-DEXTROSE 2-3 GM-% IV SOLR
2.0000 g | INTRAVENOUS | Status: AC
  Administered 2014-02-22: 2 g via INTRAVENOUS
  Filled 2014-02-22: qty 50

## 2014-02-22 MED ORDER — PROPOFOL 10 MG/ML IV BOLUS
INTRAVENOUS | Status: AC
  Filled 2014-02-22: qty 20

## 2014-02-22 MED ORDER — CEFAZOLIN SODIUM-DEXTROSE 2-3 GM-% IV SOLR
INTRAVENOUS | Status: AC
  Filled 2014-02-22: qty 50

## 2014-02-22 MED ORDER — SUCCINYLCHOLINE CHLORIDE 20 MG/ML IJ SOLN
INTRAMUSCULAR | Status: DC | PRN
  Administered 2014-02-22: 100 mg via INTRAVENOUS

## 2014-02-22 MED ORDER — SCOPOLAMINE 1 MG/3DAYS TD PT72
MEDICATED_PATCH | TRANSDERMAL | Status: AC
  Filled 2014-02-22: qty 1

## 2014-02-22 MED ORDER — METOCLOPRAMIDE HCL 5 MG/ML IJ SOLN
INTRAMUSCULAR | Status: DC | PRN
  Administered 2014-02-22: 10 mg via INTRAVENOUS

## 2014-02-22 MED ORDER — FENTANYL CITRATE 0.05 MG/ML IJ SOLN
INTRAMUSCULAR | Status: AC
  Filled 2014-02-22: qty 2

## 2014-02-22 MED ORDER — SODIUM CHLORIDE 0.9 % IR SOLN
Status: DC | PRN
  Administered 2014-02-22: 3000 mL

## 2014-02-22 MED ORDER — 0.9 % SODIUM CHLORIDE (POUR BTL) OPTIME
TOPICAL | Status: DC | PRN
  Administered 2014-02-22: 1000 mL

## 2014-02-22 MED ORDER — FENTANYL CITRATE 0.05 MG/ML IJ SOLN
INTRAMUSCULAR | Status: DC | PRN
  Administered 2014-02-22: 100 ug via INTRAVENOUS

## 2014-02-22 MED ORDER — PHENAZOPYRIDINE HCL 200 MG PO TABS: 200.0000 mg | ORAL_TABLET | Freq: Three times a day (TID) | ORAL | Status: DC | PRN

## 2014-02-22 MED ORDER — PROPOFOL 10 MG/ML IV BOLUS
INTRAVENOUS | Status: DC | PRN
  Administered 2014-02-22: 200 mg via INTRAVENOUS

## 2014-02-22 MED ORDER — BELLADONNA ALKALOIDS-OPIUM 16.2-60 MG RE SUPP
RECTAL | Status: AC
  Filled 2014-02-22: qty 1

## 2014-02-22 MED ORDER — KETOROLAC TROMETHAMINE 30 MG/ML IJ SOLN
INTRAMUSCULAR | Status: DC
Start: 2014-02-22 — End: 2014-02-23
  Filled 2014-02-22: qty 1

## 2014-02-22 MED ORDER — LIDOCAINE HCL (CARDIAC) 20 MG/ML IV SOLN
INTRAVENOUS | Status: DC | PRN
  Administered 2014-02-22: 75 mg via INTRAVENOUS
  Administered 2014-02-22: 25 mg via INTRATRACHEAL

## 2014-02-22 MED ORDER — ONDANSETRON HCL 4 MG/2ML IJ SOLN
INTRAMUSCULAR | Status: DC | PRN
  Administered 2014-02-22: 4 mg via INTRAVENOUS

## 2014-02-22 SURGICAL SUPPLY — 19 items
BAG URO CATCHER STRL LF (DRAPE) ×3 IMPLANT
BASKET ZERO TIP 1.9FR (BASKET) ×3 IMPLANT
CATH INTERMIT  6FR 70CM (CATHETERS) ×3 IMPLANT
CLOTH BEACON ORANGE TIMEOUT ST (SAFETY) ×3 IMPLANT
DRAPE CAMERA CLOSED 9X96 (DRAPES) ×3 IMPLANT
FIBER LASER FLEXIVA 1000 (UROLOGICAL SUPPLIES) IMPLANT
FIBER LASER FLEXIVA 200 (UROLOGICAL SUPPLIES) IMPLANT
FIBER LASER FLEXIVA 365 (UROLOGICAL SUPPLIES) IMPLANT
FIBER LASER FLEXIVA 550 (UROLOGICAL SUPPLIES) IMPLANT
FIBER LASER TRAC TIP (UROLOGICAL SUPPLIES) IMPLANT
GLOVE BIOGEL M STRL SZ7.5 (GLOVE) ×3 IMPLANT
GOWN STRL REUS W/TWL XL LVL3 (GOWN DISPOSABLE) ×3 IMPLANT
GUIDEWIRE STR DUAL SENSOR (WIRE) ×3 IMPLANT
MANIFOLD NEPTUNE II (INSTRUMENTS) ×3 IMPLANT
NS IRRIG 1000ML POUR BTL (IV SOLUTION) ×3 IMPLANT
PACK CYSTO (CUSTOM PROCEDURE TRAY) ×3 IMPLANT
SCRUB PCMX 4 OZ (MISCELLANEOUS) IMPLANT
TUBING CONNECTING 10 (TUBING) ×2 IMPLANT
TUBING CONNECTING 10' (TUBING) ×1

## 2014-02-22 NOTE — Anesthesia Preprocedure Evaluation (Signed)
Anesthesia Evaluation  Patient identified by MRN, date of birth, ID band Patient awake    Reviewed: Allergy & Precautions, H&P , NPO status , Patient's Chart, lab work & pertinent test results  Airway Mallampati: II  TM Distance: >3 FB Neck ROM: Full    Dental no notable dental hx. (+) Poor Dentition   Pulmonary asthma , former smoker,  breath sounds clear to auscultation  Pulmonary exam normal       Cardiovascular negative cardio ROS  Rhythm:Regular Rate:Normal     Neuro/Psych negative neurological ROS  negative psych ROS   GI/Hepatic negative GI ROS, Neg liver ROS,   Endo/Other  negative endocrine ROS  Renal/GU negative Renal ROS  negative genitourinary   Musculoskeletal negative musculoskeletal ROS (+)   Abdominal   Peds negative pediatric ROS (+)  Hematology negative hematology ROS (+)   Anesthesia Other Findings   Reproductive/Obstetrics negative OB ROS                             Anesthesia Physical Anesthesia Plan  ASA: II and emergent  Anesthesia Plan: General   Post-op Pain Management:    Induction: Intravenous  Airway Management Planned: Oral ETT  Additional Equipment:   Intra-op Plan:   Post-operative Plan: Extubation in OR  Informed Consent: I have reviewed the patients History and Physical, chart, labs and discussed the procedure including the risks, benefits and alternatives for the proposed anesthesia with the patient or authorized representative who has indicated his/her understanding and acceptance.   Dental advisory given  Plan Discussed with: CRNA  Anesthesia Plan Comments:         Anesthesia Quick Evaluation

## 2014-02-22 NOTE — H&P (View-Only) (Signed)
Urology Consult  Referring physician:   Dr. 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 She was seen at [place], and told she had UTI, and treated with antibiotic. Pain returned in 3 days, treated with Azo, and Augmentin ( home med), which she has taken for 6 days. Today, she noted severe colic with nausea and vomiting, beginning at [facility], worsening by 2pm, and she drove to [facility], where CT shows 39m LL ureteral stone, with proximal hydronephrosis.   She continues to have L flank pain, but nausea has abated.    Diet: Sodas: occasional only.   tea: 2-3 32 oz sweet tea/day.  Cigarettes: 20 yrs x 1/3 ppd= 7 pk/yr tobacco.    .   Past Medical History  Diagnosis Date  . TUBAL PREGNANCY 09/13/2008    Qualifier: History of  By: BDrue Flirt MD, TMerrily Brittle   . Bacterial vaginosis   . Asthma    Past Surgical History  Procedure Laterality Date  . Cystectomy    . Tailbone cyst      Medications: I have reviewed the patient's current medications. Allergies:  Allergies  Allergen Reactions  . Doxycycline     REACTION: Nausea  . Lorcet [Hydrocodone-Acetaminophen] Nausea And Vomiting and Rash    No family history on file. Social History:  reports that she has quit smoking. She started smoking about 15 months ago. She has never used smokeless tobacco. She reports that she uses illicit drugs (Marijuana). She reports that she does not drink alcohol.  ROS: All systems are reviewed and negative except as noted. GYN: 2 tubal pregnancies Family Hx: Father passed, 2013 cardiac disease. Age 89approx.                    Brother: suicide ( depression) age 36                   Sister ( cardiac) age 10529 Physical Exam:  Vital signs in last 24 hours: Temp:  [98 F (36.7 C)] 98 F (36.7 C)  (12/24 1605) Pulse Rate:  [72] 72 (12/24 1605) Resp:  [22] 22 (12/24 1605) BP: (165)/(94) 165/94 mmHg (12/24 1605) SpO2:  [97 %] 97 % (12/24 1605)  Cardiovascular: Skin warm; not flushed Respiratory: Breaths quiet; no shortness of breath Abdomen: No masses Neurological: Normal sensation to touch Musculoskeletal: Normal motor function arms and legs Lymphatics: No inguinal adenopathy Skin: No rashes Genitourinary: normal  Laboratory Data:  Results for orders placed or performed during the hospital encounter of 02/22/14 (from the past 72 hour(s))  Urinalysis, Routine w reflex microscopic     Status: Abnormal   Collection Time: 02/22/14  4:13 PM  Result Value Ref Range   Color, Urine RED (A) YELLOW    Comment: BIOCHEMICALS MAY BE AFFECTED BY COLOR   APPearance CLOUDY (A) CLEAR   Specific Gravity, Urine 1.031 (H) 1.005 - 1.030   pH 5.0 5.0 - 8.0   Glucose, UA NEGATIVE NEGATIVE mg/dL   Hgb urine dipstick LARGE (A) NEGATIVE   Bilirubin Urine NEGATIVE NEGATIVE   Ketones, ur NEGATIVE NEGATIVE mg/dL   Protein, ur 30 (A) NEGATIVE mg/dL   Urobilinogen, UA 0.2 0.0 - 1.0 mg/dL   Nitrite NEGATIVE NEGATIVE  Leukocytes, UA SMALL (A) NEGATIVE  Urine microscopic-add on     Status: None   Collection Time: 02/22/14  4:13 PM  Result Value Ref Range   Squamous Epithelial / LPF RARE RARE   WBC, UA 0-2 <3 WBC/hpf   RBC / HPF TOO NUMEROUS TO COUNT <3 RBC/hpf   Bacteria, UA RARE RARE   Urine-Other RARE YEAST   Basic metabolic panel     Status: Abnormal   Collection Time: 02/22/14  4:39 PM  Result Value Ref Range   Sodium 139 135 - 145 mmol/L    Comment: Please note change in reference range.   Potassium 3.6 3.5 - 5.1 mmol/L    Comment: Please note change in reference range.   Chloride 106 96 - 112 mEq/L   CO2 26 19 - 32 mmol/L   Glucose, Bld 108 (H) 70 - 99 mg/dL   BUN 17 6 - 23 mg/dL   Creatinine, Ser 0.82 0.50 - 1.10 mg/dL   Calcium 9.2 8.4 - 10.5 mg/dL   GFR calc non Af Amer >90 >90  mL/min   GFR calc Af Amer >90 >90 mL/min    Comment: (NOTE) The eGFR has been calculated using the CKD EPI equation. This calculation has not been validated in all clinical situations. eGFR's persistently <90 mL/min signify possible Chronic Kidney Disease.    Anion gap 7 5 - 15  CBC with Differential     Status: Abnormal   Collection Time: 02/22/14  4:40 PM  Result Value Ref Range   WBC 13.4 (H) 4.0 - 10.5 K/uL   RBC 4.27 3.87 - 5.11 MIL/uL   Hemoglobin 14.3 12.0 - 15.0 g/dL   HCT 41.6 36.0 - 46.0 %   MCV 97.4 78.0 - 100.0 fL   MCH 33.5 26.0 - 34.0 pg   MCHC 34.4 30.0 - 36.0 g/dL   RDW 12.4 11.5 - 15.5 %   Platelets 201 150 - 400 K/uL   Neutrophils Relative % 73 43 - 77 %   Neutro Abs 9.8 (H) 1.7 - 7.7 K/uL   Lymphocytes Relative 21 12 - 46 %   Lymphs Abs 2.8 0.7 - 4.0 K/uL   Monocytes Relative 5 3 - 12 %   Monocytes Absolute 0.7 0.1 - 1.0 K/uL   Eosinophils Relative 1 0 - 5 %   Eosinophils Absolute 0.1 0.0 - 0.7 K/uL   Basophils Relative 0 0 - 1 %   Basophils Absolute 0.0 0.0 - 0.1 K/uL  Pregnancy, urine     Status: None   Collection Time: 02/22/14  4:41 PM  Result Value Ref Range   Preg Test, Ur NEGATIVE NEGATIVE    Comment:        THE SENSITIVITY OF THIS METHODOLOGY IS >20 mIU/mL.    No results found for this or any previous visit (from the past 240 hour(s)). Creatinine:  Recent Labs  02/22/14 1639  CREATININE 0.82    Xrays: CLINICAL DATA: Left-sided flank pain  EXAM: CT ABDOMEN AND PELVIS WITHOUT CONTRAST  TECHNIQUE: Multidetector CT imaging of the abdomen and pelvis was performed following the standard protocol without IV contrast.  COMPARISON: None.  FINDINGS: Lung bases are free of acute infiltrate or sizable effusion.  The liver, spleen, gallbladder, adrenal glands and pancreas are all normal in their CT appearance.  The kidneys are well visualized bilaterally. The right kidney shows no renal calculi or obstructive changes. The left  kidney demonstrates significant hydronephrosis and hydroureter which extends inferiorly to the  level of the ureterovesical junction. On image number 70 of series 2 x 5 mm stone is noted causing the proximal obstructive change.  The bladder is decompressed. A left ovarian cyst is noted measuring 2.2 cm in greatest dimension. The appendix is not well appreciated although no inflammatory changes to suggest appendicitis are noted. No free pelvic fluid is seen. The osseous structures are grossly unremarkable.  IMPRESSION: Left ureterovesical junction stone measuring 5 mm causing hydronephrosis and hydroureter.  Left ovarian cyst.  No other acute abnormality is seen.   Electronically Signed  By: Inez Catalina M.D.  On: 02/22/2014 17:45      External Result Report      Impression/Assessment:   L lower ureteral stone. Colic despite Rx in ED.  Best plan will be for stone removal.  Plan:    To OR tonight.   Deshanna Kama I 02/22/2014, 7:14 PM

## 2014-02-22 NOTE — Transfer of Care (Signed)
Immediate Anesthesia Transfer of Care Note  Patient: Gail Bass  Procedure(s) Performed: Procedure(s): CYSTOSCOPY WITH RETROGRADE PYELOGRAM, URETEROSCOPY, STONE BASKET EXTRACTION  (Left)  Patient Location: PACU  Anesthesia Type:General  Level of Consciousness: awake, alert , oriented and patient cooperative  Airway & Oxygen Therapy: Patient Spontanous Breathing and Patient connected to face mask oxygen  Post-op Assessment: Report given to PACU RN, Post -op Vital signs reviewed and stable and Patient moving all extremities X 4  Post vital signs: stable  Complications: No apparent anesthesia complications

## 2014-02-22 NOTE — Progress Notes (Signed)
Pt up to BR, voided QS, IV d/c'ed; pt assisted to dress; friend here and pt dc'ed home

## 2014-02-22 NOTE — ED Notes (Signed)
Per pt, left flank pain for 3 weeks-tried treating her self-dysuria

## 2014-02-22 NOTE — ED Notes (Signed)
Patient request Comment: Obtain Consent: Pt states she has more questions and would like to talk to surgeon before signing. OR tech at bedside requested by RN to communicate pt wishes to the Pre-Op team

## 2014-02-22 NOTE — ED Provider Notes (Signed)
CSN: [id number]     Arrival date & time 02/22/14  1549 History   First MD Initiated Contact with Patient 02/22/14 1627     Chief Complaint  Patient presents with  . Flank Pain     (Consider location/radiation/quality/duration/timing/severity/associated sxs/prior Treatment) HPI  Gail Bass is a 38 y.o. female with PMH of tubal pregnancy, asthma presenting with persistent urinary tract infection. Patient was diagnosed with urinary tract infection and prescribed Keflex one month ago. She states she completed her course and had improvement of her symptoms. It returned about a week later and patient took some Augmentin she had at home and has completed a full week's worth without improvement of her symptoms. Patient presents today with persistent urgency, urinary retention, foul smell to urine. She also has some suprapubic abdominal pain that is worse with urination. She denies fevers, chills. She does have left flank pain for 3 weeks as well as vomiting today. She has vomited twice while in ED. Emesis is nonbloody and without coffee grounds. Patient denies any history of kidney stones. In 2008 she had a CT which showed a 3.4 mm nonobstructing kidney stone. Patient denies vaginal complaints currently but has been taking metronidazole for BV. She denies any changes in her stool or blood.   Past Medical History  Diagnosis Date  . TUBAL PREGNANCY 09/13/2008    Qualifier: History of  By: Lanier PrudeBolden  MD, Cathrine Musteraineisha    . Bacterial vaginosis   . Asthma    Past Surgical History  Procedure Laterality Date  . Cystectomy    . Tailbone cyst     No family history on file. History  Substance Use Topics  . Smoking status: Former Smoker    Start date: 11/19/2012  . Smokeless tobacco: Never Used  . Alcohol Use: No   OB History    No data available     Review of Systems  Constitutional: Negative for fever and chills.  HENT: Negative for congestion and rhinorrhea.   Eyes: Negative for visual  disturbance.  Respiratory: Negative for cough and shortness of breath.   Cardiovascular: Negative for chest pain and palpitations.  Gastrointestinal: Positive for vomiting. Negative for nausea and diarrhea.  Genitourinary: Positive for dysuria, urgency and flank pain. Negative for hematuria.  Skin: Negative for rash.  Neurological: Negative for weakness and headaches.      Allergies  Doxycycline and Lorcet  Home Medications   Prior to Admission medications   Medication Sig Start Date End Date Taking? Authorizing Provider  amoxicillin-clavulanate (AUGMENTIN) 875-125 MG per tablet Take 1 tablet by mouth 2 (two) times daily. Starting 12/19. 02/17/14  Yes Historical Provider, MD  ELDERBERRY PO Take by mouth to Post Anesthesia Care Unit.   Yes Historical Provider, MD  Phenazopyridine HCl (AZO TABS PO) Take by mouth.   Yes Historical Provider, MD  albuterol (PROVENTIL HFA;VENTOLIN HFA) 108 (90 BASE) MCG/ACT inhaler Inhale 2 puffs into the lungs every 6 (six) hours as needed for wheezing or shortness of breath. Patient not taking: Reported on 02/22/2014 11/10/13   Renee A Kuneff, DO  cephALEXin (KEFLEX) 500 MG capsule Take 1 capsule (500 mg total) by mouth 3 (three) times daily. Patient not taking: Reported on 02/22/2014 01/10/14   Saralyn PilarAlexander Karamalegos, DO  fluconazole (DIFLUCAN) 150 MG tablet Take 1 tablet (150 mg total) by mouth once. Patient not taking: Reported on 02/22/2014 01/10/14   Saralyn PilarAlexander Karamalegos, DO  ibuprofen (ADVIL,MOTRIN) 800 MG tablet Take 1 tablet (800 mg total) by mouth every 8 (  eight) hours as needed. 1 tab by mouth as needed for pain Patient not taking: Reported on 02/22/2014 09/28/13   Charlane FerrettiMelanie C Marsh, MD  metroNIDAZOLE (METROGEL VAGINAL) 0.75 % vaginal gel Place 1 Applicatorful vaginally at bedtime. For 5 nights. Patient not taking: Reported on 02/22/2014 02/15/14   Saralyn PilarAlexander Karamalegos, DO  predniSONE (DELTASONE) 50 MG tablet Take 50 mg for 5 days Patient not  taking: Reported on 02/22/2014 11/10/13   Renee A Kuneff, DO  spironolactone (ALDACTONE) 50 MG tablet Take 1 tablet (50 mg total) by mouth daily. Patient not taking: Reported on 02/22/2014 05/11/13   Lora PaulaJosalyn C Funches, MD  varenicline (CHANTIX STARTING MONTH PAK) 0.5 MG X 11 & 1 MG X 42 tablet Take one 0.5 mg tablet by mouth once daily for 3 days, then increase to one 0.5 mg tablet twice daily for 4 days, then increase to one 1 mg tablet twice daily. Patient not taking: Reported on 02/22/2014 03/21/13   Ellison CarwinJosalyn C Funches, MD   BP 125/69 mmHg  Pulse 95  Temp(Src) 98.4 F (36.9 C) (Oral)  Resp 18  SpO2 100%  LMP 01/30/2014 Physical Exam  Constitutional: She appears well-developed and well-nourished. No distress.  HENT:  Head: Normocephalic and atraumatic.  Mouth/Throat: Oropharynx is clear and moist.  Eyes: Conjunctivae and EOM are normal. Right eye exhibits no discharge. Left eye exhibits no discharge.  Cardiovascular: Normal rate, regular rhythm and normal heart sounds.   Pulmonary/Chest: Effort normal and breath sounds normal. No respiratory distress. She has no wheezes.  Abdominal: Soft. Bowel sounds are normal. She exhibits no distension.  Midline suprapubic abdominal tenderness without rebound, rigidity, guarding. Patient with left CVA tenderness. No right CVA tenderness. No back tenderness.  Neurological: She is alert. She exhibits normal muscle tone. Coordination normal.  Skin: Skin is warm and dry. She is not diaphoretic.  Nursing note and vitals reviewed.   ED Course  Procedures (including critical care time) Labs Review Labs Reviewed  BASIC METABOLIC PANEL - Abnormal; Notable for the following:    Glucose, Bld 108 (*)    All other components within normal limits  URINALYSIS, ROUTINE W REFLEX MICROSCOPIC - Abnormal; Notable for the following:    Color, Urine RED (*)    APPearance CLOUDY (*)    Specific Gravity, Urine 1.031 (*)    Hgb urine dipstick LARGE (*)    Protein,  ur 30 (*)    Leukocytes, UA SMALL (*)    All other components within normal limits  CBC WITH DIFFERENTIAL - Abnormal; Notable for the following:    WBC 13.4 (*)    Neutro Abs 9.8 (*)    All other components within normal limits  URINE CULTURE  HEPATIC FUNCTION PANEL  PREGNANCY, URINE  URINE MICROSCOPIC-ADD ON  CBC  BASIC METABOLIC PANEL  URINALYSIS, ROUTINE W REFLEX MICROSCOPIC    Imaging Review Ct Renal Stone Study  02/22/2014   CLINICAL DATA:  Left-sided flank pain  EXAM: CT ABDOMEN AND PELVIS WITHOUT CONTRAST  TECHNIQUE: Multidetector CT imaging of the abdomen and pelvis was performed following the standard protocol without IV contrast.  COMPARISON:  None.  FINDINGS: Lung bases are free of acute infiltrate or sizable effusion.  The liver, spleen, gallbladder, adrenal glands and pancreas are all normal in their CT appearance.  The kidneys are well visualized bilaterally. The right kidney shows no renal calculi or obstructive changes. The left kidney demonstrates significant hydronephrosis and hydroureter which extends inferiorly to the level of the ureterovesical junction. On  image number 70 of series 2 x 5 mm stone is noted causing the proximal obstructive change.  The bladder is decompressed. A left ovarian cyst is noted measuring 2.2 cm in greatest dimension. The appendix is not well appreciated although no inflammatory changes to suggest appendicitis are noted. No free pelvic fluid is seen. The osseous structures are grossly unremarkable.  IMPRESSION: Left ureterovesical junction stone measuring 5 mm causing hydronephrosis and hydroureter.  Left ovarian cyst.  No other acute abnormality is seen.   Electronically Signed   By: Alcide Clever M.D.   On: 02/22/2014 17:45     EKG Interpretation None      MDM   Final diagnoses:  Left flank pain  Hydronephrosis with renal and ureteral calculus obstruction  Nausea and vomiting, vomiting of unspecified type  Hydroureter   Patient with  past medical history of kidney stones presenting with one-week history of left-sided flank pain and found to have 5 mm obstructing stone on the left UVJ with significant hydronephrosis as well as hydroureter. Urine not infected and kidney function within normal limits. Pain much improved in the ED and patient no longer vomiting. Concern that patient will not be able to pass stone on her own. Consult to urology. Spoke with Dr. Patsi Sears who states he is doing 2 surgeries at [facility] and will see the patient in the ED before. Patient evaluated by urologist who plans to take her to the OR tonight with plan to discharge afterward.  Discussed return precautions with patient. Discussed all results and patient verbalizes understanding and agrees with plan.  Case has been discussed with Dr. Juleen China who agrees with the above plan and to discharge.       Louann Sjogren, PA-C 02/22/14 2131  Raeford Razor, MD 02/26/14 [phone]

## 2014-02-22 NOTE — Op Note (Signed)
Pre-operative diagnosis :   5 mm x 3 mm left ureterovesical junction stone without progression, and with recurrent left renal colic  Postoperative diagnosis:  Same  Operation:  Cystourethroscopy, left ridge grade pyelogram interpretation, left ureteroscopy, basket extraction of left lower ureteral calculus.  Surgeon:  Kathie RhodesS. Patsi Searsannenbaum, MD  First assistant:  None  Anesthesia:  General LMA  Preparation:  After appropriate preanesthesia, the patient was brought the operating room, placed on the operative table in the dorsal supine position where general LMA anesthesia was introduced. The pubis was then prepped with Betadine solution, and draped in usual fashion. The history was double checked. The armband was double checked.  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 [facility], and told she had UTI, and treated with antibiotic. Pain returned in 3 days, treated with Azo, and Augmentin ( home med), which she has taken for 6 days. Today, she noted severe colic with nausea and vomiting, [facility] worsening by [facility], [facility] CT shows 5mm LL ureteral stone, with proximal hydronephrosis. She continues to have L flank pain, but nausea has abated.   Diet: Sodas: occasional only. tea: 2-3 32 oz sweet tea/day.  Cigarettes: 20 yrs x 1/3 ppd= 7 pk/yr tobacco.   Statement of  Likelihood of Success: Excellent. TIME-OUT observed.:  Procedure:  Cystourethroscopy is a copy, showing a normal female urethra. The bladder base was normal. There was clear reflux in the right ureteral orifice. The trigone was normal. There was no evidence of bladder stone, tumor, or diverticular phonation. Left [facility] pyelogram showed a stone in the left lower ureter, at the level of the ureterovesical junction. A 0.038 guidewire was then passed from the bladder, and into the left renal pelvis and coiled. A  6.4 short ureteroscope was then passed into the lower ureter, and the stone was identified. A basket was then passed around the stone, and the stone extracted without difficulty. Repeat ureteroscopy revealed no further stone. There was some streaking of blood within the ureter, but this was minor, and was felt that the patient did not need a double-J stent. She received IV Tylenol, and IV Toradol. She'll be allowed to be discharged from the hospital. She was awakened, and taken to recovery room in good condition.

## 2014-02-22 NOTE — Interval H&P Note (Signed)
History and Physical Interval Note:  02/22/2014 9:20 PM  Gail Bass  has presented today for surgery, with the diagnosis of left lower ureteral stone  The various methods of treatment have been discussed with the patient and family. After consideration of risks, benefits and other options for treatment, the patient has consented to  Procedure(s): CYSTOSCOPY WITH RETROGRADE PYELOGRAM, URETEROSCOPY AND STENT PLACEMENT (Left) HOLMIUM LASER APPLICATION (Left) as a surgical intervention .  The patient's history has been reviewed, patient examined, no change in status, stable for surgery.  I have reviewed the patient's chart and labs.  Questions were answered to the patient's satisfaction.     Jethro BolusANNENBAUM, Corinne Goucher I

## 2014-02-22 NOTE — ED Notes (Signed)
Patient transported to CT 

## 2014-02-22 NOTE — ED Notes (Signed)
Urologist at bedside.

## 2014-02-22 NOTE — OR Nursing (Signed)
Stone taken by Dr. Marcello Fennelannebaum.

## 2014-02-22 NOTE — Consult Note (Signed)
Urology Consult  Referring physician:   Dr. 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 She was seen at [place], and told she had UTI, and treated with antibiotic. Pain returned in 3 days, treated with Azo, and Augmentin ( home med), which she has taken for 6 days. Today, she noted severe colic with nausea and vomiting, beginning at [facility], worsening by 2pm, and she drove to [facility], where CT shows 39m LL ureteral stone, with proximal hydronephrosis.   She continues to have L flank pain, but nausea has abated.    Diet: Sodas: occasional only.   tea: 2-3 32 oz sweet tea/day.  Cigarettes: 20 yrs x 1/3 ppd= 7 pk/yr tobacco.    .   Past Medical History  Diagnosis Date  . TUBAL PREGNANCY 09/13/2008    Qualifier: History of  By: BDrue Flirt MD, TMerrily Brittle   . Bacterial vaginosis   . Asthma    Past Surgical History  Procedure Laterality Date  . Cystectomy    . Tailbone cyst      Medications: I have reviewed the patient's current medications. Allergies:  Allergies  Allergen Reactions  . Doxycycline     REACTION: Nausea  . Lorcet [Hydrocodone-Acetaminophen] Nausea And Vomiting and Rash    No family history on file. Social History:  reports that she has quit smoking. She started smoking about 15 months ago. She has never used smokeless tobacco. She reports that she uses illicit drugs (Marijuana). She reports that she does not drink alcohol.  ROS: All systems are reviewed and negative except as noted. GYN: 2 tubal pregnancies Family Hx: Father passed, 2013 cardiac disease. Age 89approx.                    Brother: suicide ( depression) age 36                   Sister ( cardiac) age 10529 Physical Exam:  Vital signs in last 24 hours: Temp:  [98 F (36.7 C)] 98 F (36.7 C)  (12/24 1605) Pulse Rate:  [72] 72 (12/24 1605) Resp:  [22] 22 (12/24 1605) BP: (165)/(94) 165/94 mmHg (12/24 1605) SpO2:  [97 %] 97 % (12/24 1605)  Cardiovascular: Skin warm; not flushed Respiratory: Breaths quiet; no shortness of breath Abdomen: No masses Neurological: Normal sensation to touch Musculoskeletal: Normal motor function arms and legs Lymphatics: No inguinal adenopathy Skin: No rashes Genitourinary: normal  Laboratory Data:  Results for orders placed or performed during the hospital encounter of 02/22/14 (from the past 72 hour(s))  Urinalysis, Routine w reflex microscopic     Status: Abnormal   Collection Time: 02/22/14  4:13 PM  Result Value Ref Range   Color, Urine RED (A) YELLOW    Comment: BIOCHEMICALS MAY BE AFFECTED BY COLOR   APPearance CLOUDY (A) CLEAR   Specific Gravity, Urine 1.031 (H) 1.005 - 1.030   pH 5.0 5.0 - 8.0   Glucose, UA NEGATIVE NEGATIVE mg/dL   Hgb urine dipstick LARGE (A) NEGATIVE   Bilirubin Urine NEGATIVE NEGATIVE   Ketones, ur NEGATIVE NEGATIVE mg/dL   Protein, ur 30 (A) NEGATIVE mg/dL   Urobilinogen, UA 0.2 0.0 - 1.0 mg/dL   Nitrite NEGATIVE NEGATIVE  Leukocytes, UA SMALL (A) NEGATIVE  Urine microscopic-add on     Status: None   Collection Time: 02/22/14  4:13 PM  Result Value Ref Range   Squamous Epithelial / LPF RARE RARE   WBC, UA 0-2 <3 WBC/hpf   RBC / HPF TOO NUMEROUS TO COUNT <3 RBC/hpf   Bacteria, UA RARE RARE   Urine-Other RARE YEAST   Basic metabolic panel     Status: Abnormal   Collection Time: 02/22/14  4:39 PM  Result Value Ref Range   Sodium 139 135 - 145 mmol/L    Comment: Please note change in reference range.   Potassium 3.6 3.5 - 5.1 mmol/L    Comment: Please note change in reference range.   Chloride 106 96 - 112 mEq/L   CO2 26 19 - 32 mmol/L   Glucose, Bld 108 (H) 70 - 99 mg/dL   BUN 17 6 - 23 mg/dL   Creatinine, Ser 0.82 0.50 - 1.10 mg/dL   Calcium 9.2 8.4 - 10.5 mg/dL   GFR calc non Af Amer >90 >90  mL/min   GFR calc Af Amer >90 >90 mL/min    Comment: (NOTE) The eGFR has been calculated using the CKD EPI equation. This calculation has not been validated in all clinical situations. eGFR's persistently <90 mL/min signify possible Chronic Kidney Disease.    Anion gap 7 5 - 15  CBC with Differential     Status: Abnormal   Collection Time: 02/22/14  4:40 PM  Result Value Ref Range   WBC 13.4 (H) 4.0 - 10.5 K/uL   RBC 4.27 3.87 - 5.11 MIL/uL   Hemoglobin 14.3 12.0 - 15.0 g/dL   HCT 41.6 36.0 - 46.0 %   MCV 97.4 78.0 - 100.0 fL   MCH 33.5 26.0 - 34.0 pg   MCHC 34.4 30.0 - 36.0 g/dL   RDW 12.4 11.5 - 15.5 %   Platelets 201 150 - 400 K/uL   Neutrophils Relative % 73 43 - 77 %   Neutro Abs 9.8 (H) 1.7 - 7.7 K/uL   Lymphocytes Relative 21 12 - 46 %   Lymphs Abs 2.8 0.7 - 4.0 K/uL   Monocytes Relative 5 3 - 12 %   Monocytes Absolute 0.7 0.1 - 1.0 K/uL   Eosinophils Relative 1 0 - 5 %   Eosinophils Absolute 0.1 0.0 - 0.7 K/uL   Basophils Relative 0 0 - 1 %   Basophils Absolute 0.0 0.0 - 0.1 K/uL  Pregnancy, urine     Status: None   Collection Time: 02/22/14  4:41 PM  Result Value Ref Range   Preg Test, Ur NEGATIVE NEGATIVE    Comment:        THE SENSITIVITY OF THIS METHODOLOGY IS >20 mIU/mL.    No results found for this or any previous visit (from the past 240 hour(s)). Creatinine:  Recent Labs  02/22/14 1639  CREATININE 0.82    Xrays: CLINICAL DATA: Left-sided flank pain  EXAM: CT ABDOMEN AND PELVIS WITHOUT CONTRAST  TECHNIQUE: Multidetector CT imaging of the abdomen and pelvis was performed following the standard protocol without IV contrast.  COMPARISON: None.  FINDINGS: Lung bases are free of acute infiltrate or sizable effusion.  The liver, spleen, gallbladder, adrenal glands and pancreas are all normal in their CT appearance.  The kidneys are well visualized bilaterally. The right kidney shows no renal calculi or obstructive changes. The left  kidney demonstrates significant hydronephrosis and hydroureter which extends inferiorly to the  level of the ureterovesical junction. On image number 70 of series 2 x 5 mm stone is noted causing the proximal obstructive change.  The bladder is decompressed. A left ovarian cyst is noted measuring 2.2 cm in greatest dimension. The appendix is not well appreciated although no inflammatory changes to suggest appendicitis are noted. No free pelvic fluid is seen. The osseous structures are grossly unremarkable.  IMPRESSION: Left ureterovesical junction stone measuring 5 mm causing hydronephrosis and hydroureter.  Left ovarian cyst.  No other acute abnormality is seen.   Electronically Signed  By: Inez Catalina M.D.  On: 02/22/2014 17:45      External Result Report      Impression/Assessment:   L lower ureteral stone. Colic despite Rx in ED.  Best plan will be for stone removal.  Plan:    To OR tonight.   Ronen Bromwell I 02/22/2014, 7:14 PM

## 2014-02-24 LAB — URINE CULTURE
Colony Count: NO GROWTH
Culture: NO GROWTH

## 2014-02-24 NOTE — Anesthesia Postprocedure Evaluation (Signed)
  Anesthesia Post-op Note  Patient: Gail Bass  Procedure(s) Performed: Procedure(s) (LRB): CYSTOSCOPY WITH RETROGRADE PYELOGRAM, URETEROSCOPY, STONE BASKET EXTRACTION  (Left)  Patient Location: PACU  Anesthesia Type: General  Level of Consciousness: awake and alert   Airway and Oxygen Therapy: Patient Spontanous Breathing  Post-op Pain: mild  Post-op Assessment: Post-op Vital signs reviewed, Patient's Cardiovascular Status Stable, Respiratory Function Stable, Patent Airway and No signs of Nausea or vomiting  Last Vitals:  Filed Vitals:   02/22/14 2300  BP: 130/80  Pulse: 70  Temp: 36.7 C  Resp: 14    Post-op Vital Signs: stable   Complications: No apparent anesthesia complications

## 2014-02-26 ENCOUNTER — Encounter (HOSPITAL_COMMUNITY): Payer: Self-pay | Admitting: Urology

## 2014-04-19 ENCOUNTER — Ambulatory Visit (INDEPENDENT_AMBULATORY_CARE_PROVIDER_SITE_OTHER): Payer: Medicaid Other | Admitting: Family Medicine

## 2014-04-19 ENCOUNTER — Other Ambulatory Visit (HOSPITAL_COMMUNITY)
Admission: RE | Admit: 2014-04-19 | Discharge: 2014-04-19 | Disposition: A | Discharge status: Home / Self Care | Payer: Medicaid Other | Source: Ambulatory Visit | Attending: Family Medicine | Admitting: Family Medicine

## 2014-04-19 ENCOUNTER — Encounter: Payer: Self-pay | Admitting: Family Medicine

## 2014-04-19 VITALS — BP 160/117 | HR 86 | Temp 98.1°F | Ht 62.0 in | Wt 120.0 lb

## 2014-04-19 DIAGNOSIS — Z113 Encounter for screening for infections with a predominantly sexual mode of transmission: Secondary | ICD-10-CM | POA: Diagnosis present

## 2014-04-19 DIAGNOSIS — Z1151 Encounter for screening for human papillomavirus (HPV): Secondary | ICD-10-CM | POA: Insufficient documentation

## 2014-04-19 DIAGNOSIS — Z01419 Encounter for gynecological examination (general) (routine) without abnormal findings: Secondary | ICD-10-CM | POA: Diagnosis present

## 2014-04-19 DIAGNOSIS — Z124 Encounter for screening for malignant neoplasm of cervix: Secondary | ICD-10-CM

## 2014-04-19 DIAGNOSIS — N898 Other specified noninflammatory disorders of vagina: Secondary | ICD-10-CM

## 2014-04-19 DIAGNOSIS — R300 Dysuria: Secondary | ICD-10-CM

## 2014-04-19 LAB — POCT URINALYSIS DIPSTICK
Bilirubin, UA: NEGATIVE
Glucose, UA: NEGATIVE
KETONES UA: NEGATIVE
Leukocytes, UA: NEGATIVE
Nitrite, UA: NEGATIVE
PH UA: 6
Protein, UA: NEGATIVE
Spec Grav, UA: 1.02
Urobilinogen, UA: 0.2

## 2014-04-19 LAB — POCT WET PREP (WET MOUNT): CLUE CELLS WET PREP WHIFF POC: NEGATIVE

## 2014-04-19 LAB — POCT UA - MICROSCOPIC ONLY

## 2014-04-19 LAB — POCT URINE PREGNANCY: Preg Test, Ur: NEGATIVE

## 2014-04-19 NOTE — Progress Notes (Signed)
Patient ID: Gail Bass, female   DOB: 11/15/1975, 39 y.o.   MRN: [id number]  HPI:  Pt presents for a same day appointment to discuss STD testing and possible UTI.  Pt reports that she had a kidney stone removed back in December. She has recently resumed sexual activity with her partner, whom she thinks may have been having sex with other people as well. She had foul smelling discharge on Feb 7. Reports that she has hx of frequent BV infections. She used some metrogel and used two old flagyl pills and thinks the odor is now improved. She also noted on the 7th that she was hurting more than is normal for her after sex. The pain has now lessened. She recently learned about PID and cervical motion tenderness, and would like to be checked for this.  She still has thick white discharge and has cramping feeling and some back pain. She feels mildly nauseated and tired. No fevers. Has had some aching with urination but no burning. Does have some urgency and thinks she may have bladder spasm. Wants to make sure she doesn't have a UTI today. She followed up with urology after her cystoscopy and kidney stone removal, and was told she could follow up as needed in the future. She has a hx of bilateral salpingectomies due to two separate ectopic pregnancies.  ROS: See HPI  PMFSH: hx dep, asthma, recurrent BV, tobacco abuse, HTN  PHYSICAL EXAM: BP 160/117 mmHg  Pulse 86  Temp(Src) 98.1 F (36.7 C) (Oral)  Ht 5\' 2"  (1.575 m)  Wt 120 lb (54.432 kg)  BMI 21.94 kg/m2  LMP 03/26/2014 Gen: NAD HEENT: NCAT Abdomen: soft NTTP Back: no CVA tenderness bilat GU: normal appearing external genitalia without lesions. Vagina is moist with white discharge. Cervix normal in appearance. No cervical motion tenderness or tenderness on bimanual exam. No adnexal masses.  Neuro: grossly nonfocal speech normal  ASSESSMENT/PLAN:  1. STD testing: -gc/chlamydia and wet prep collected today -no signs of cervical motion  tenderness, PID, or STD's on exam -given supply of condoms during today's appt and counseled on importance of condom use to prevent STD's.  2. Vague pelvic discomfort: Suspect this is all related to recent instrumentation of her urinary tract. UA unremarkable for infection but does have some microscopic hematuria. Pregnancy test negative. -consider repeat UA at next appointment. May need to return to urology for further eval if hematuria or discomfort persists.  3. Health maintenance:  -pap smear with HPV cotest collected today during pelvic exam since pt was due for this -counseled pt to schedule appt with PCP to discuss the rest of her routine health maintenance  FOLLOW UP: F/u when able with PCP for routine physical. Consider repeat UA at that visit to eval for persistent hematuria.  Gail J. Pollie MeyerMcIntyre, MD [facility]

## 2014-04-19 NOTE — Patient Instructions (Signed)
Your urine doesn't look like a UTI No signs of BV either Testing for STD's We'll call you with results Return if not getting better You might need to follow up with the urologist again if you keep having symptoms  Be well, Dr. Pollie MeyerMcIntyre

## 2014-04-20 LAB — CERVICOVAGINAL ANCILLARY ONLY
CHLAMYDIA, DNA PROBE: NEGATIVE
Neisseria Gonorrhea: NEGATIVE

## 2014-04-23 ENCOUNTER — Telehealth: Payer: Self-pay | Admitting: *Deleted

## 2014-04-23 LAB — CYTOLOGY - PAP

## 2014-04-23 NOTE — Telephone Encounter (Signed)
-----   Message from Latrelle DodrillBrittany J McIntyre, MD sent at 04/23/2014  4:17 PM EST ----- Please inform patient that pap is normal and all STD tests negative. She should have repeat pap test in 5 years.

## 2014-04-23 NOTE — Telephone Encounter (Signed)
Pt informed. Deseree Blount, CMA  

## 2014-04-23 NOTE — Telephone Encounter (Signed)
Urine didn't show any signs of infection but still has some blood, which may be due to the recent kidney stone removal. I recommend she have a repeat urine test done when she follows up with her primary doctor in a few weeks for a physical. Please inform patient.  Latrelle DodrillBrittany J Nicolai Labonte, MD

## 2014-04-23 NOTE — Telephone Encounter (Signed)
Pt wants to know if the results of the urine that you told her you were going to send out ar back. Gail Bass, CMA

## 2014-04-24 NOTE — Telephone Encounter (Signed)
Pt informed. Deseree Blount, CMA  

## 2014-05-29 ENCOUNTER — Encounter: Payer: Self-pay | Admitting: Family Medicine

## 2014-07-05 ENCOUNTER — Emergency Department (HOSPITAL_COMMUNITY)
Admission: EM | Admit: 2014-07-05 | Discharge: 2014-07-05 | Disposition: A | Discharge status: Home / Self Care | Payer: Medicaid Other | Source: Home / Self Care | Attending: Family Medicine | Admitting: Family Medicine

## 2014-07-05 ENCOUNTER — Encounter (HOSPITAL_COMMUNITY): Payer: Self-pay | Admitting: Emergency Medicine

## 2014-07-05 DIAGNOSIS — N3001 Acute cystitis with hematuria: Secondary | ICD-10-CM | POA: Diagnosis not present

## 2014-07-05 LAB — POCT URINALYSIS DIP (DEVICE)
BILIRUBIN URINE: NEGATIVE
Glucose, UA: NEGATIVE mg/dL
Ketones, ur: NEGATIVE mg/dL
NITRITE: POSITIVE — AB
PH: 6.5 (ref 5.0–8.0)
PROTEIN: 100 mg/dL — AB
Specific Gravity, Urine: 1.025 (ref 1.005–1.030)
Urobilinogen, UA: 0.2 mg/dL (ref 0.0–1.0)

## 2014-07-05 LAB — POCT PREGNANCY, URINE: Preg Test, Ur: NEGATIVE

## 2014-07-05 MED ORDER — CEPHALEXIN 500 MG PO CAPS: 500.0000 mg | ORAL_CAPSULE | Freq: Three times a day (TID) | ORAL | Status: DC

## 2014-07-05 MED ORDER — FLUCONAZOLE 150 MG PO TABS: 150.0000 mg | ORAL_TABLET | Freq: Once | ORAL | Status: DC

## 2014-07-05 NOTE — ED Provider Notes (Signed)
Gail Bass Skainsatricia L Tomasik is a 39 y.o. female who presents to Urgent Care today for urinary frequency urgency hematuria and dysuria present for 2 days. No fevers or chills vomiting or diarrhea. She had some back pain a week ago but none since. She has a history of a kidney stone and frequent UTIs.   Past Medical History  Diagnosis Date  . TUBAL PREGNANCY 09/13/2008    Qualifier: History of  By: Lanier PrudeBolden  MD, Cathrine Musteraineisha    . Bacterial vaginosis   . Asthma    Past Surgical History  Procedure Laterality Date  . Cystectomy    . Tailbone cyst    . Cystoscopy with retrograde pyelogram, ureteroscopy and stent placement Left 02/22/2014    Procedure: CYSTOSCOPY WITH RETROGRADE PYELOGRAM, URETEROSCOPY, STONE BASKET EXTRACTION ;  Surgeon: Kathi LudwigSigmund I Tannenbaum, MD;  Location: [facility];  Service: Urology;  Laterality: Left;   History  Substance Use Topics  . Smoking status: Former Smoker    Start date: 11/19/2012  . Smokeless tobacco: Never Used  . Alcohol Use: No   ROS as above Medications: No current facility-administered medications for this encounter.   Current Outpatient Prescriptions  Medication Sig Dispense Refill  . albuterol (PROVENTIL HFA;VENTOLIN HFA) 108 (90 BASE) MCG/ACT inhaler Inhale 2 puffs into the lungs every 6 (six) hours as needed for wheezing or shortness of breath. (Patient not taking: Reported on 02/22/2014) 1 Inhaler 0  . amoxicillin-clavulanate (AUGMENTIN) 875-125 MG per tablet Take 1 tablet by mouth 2 (two) times daily. Starting 12/19.    . cephALEXin (KEFLEX) 500 MG capsule Take 1 capsule (500 mg total) by mouth 3 (three) times daily. 21 capsule 0  . ELDERBERRY PO Take by mouth to Post Anesthesia Care Unit.    . fluconazole (DIFLUCAN) 150 MG tablet Take 1 tablet (150 mg total) by mouth once. 1 tablet 1  . phenazopyridine (PYRIDIUM) 200 MG tablet Take 1 tablet (200 mg total) by mouth 3 (three) times daily as needed for pain (burning). 30 tablet 3  . Phenazopyridine HCl (AZO TABS  PO) Take by mouth.    . spironolactone (ALDACTONE) 50 MG tablet Take 1 tablet (50 mg total) by mouth daily. (Patient not taking: Reported on 02/22/2014) 30 tablet 1  . varenicline (CHANTIX STARTING MONTH PAK) 0.5 MG X 11 & 1 MG X 42 tablet Take one 0.5 mg tablet by mouth once daily for 3 days, then increase to one 0.5 mg tablet twice daily for 4 days, then increase to one 1 mg tablet twice daily. (Patient not taking: Reported on 02/22/2014) 53 tablet 0  . [DISCONTINUED] ibuprofen (ADVIL,MOTRIN) 800 MG tablet Take 1 tablet (800 mg total) by mouth every 8 (eight) hours as needed. 1 tab by mouth as needed for pain (Patient not taking: Reported on 02/22/2014) 30 tablet 3   Allergies  Allergen Reactions  . Doxycycline     REACTION: Nausea  . Lorcet [Hydrocodone-Acetaminophen] Nausea And Vomiting and Rash     Exam:  BP 153/99 mmHg  Pulse 68  Temp(Src) 98 F (36.7 C) (Oral)  Resp 14  SpO2 98% Gen: Well NAD HEENT: EOMI,  MMM Lungs: Normal work of breathing. CTABL Heart: RRR no MRG Abd: NABS, Soft. Nondistended, Nontender no CV angle tenderness to percussion Exts: Brisk capillary refill, warm and well perfused.   Results for orders placed or performed during the hospital encounter of 07/05/14 (from the past 24 hour(s))  POCT urinalysis dip (device)     Status: Abnormal   Collection Time:  07/05/14  7:44 PM  Result Value Ref Range   Glucose, UA NEGATIVE NEGATIVE mg/dL   Bilirubin Urine NEGATIVE NEGATIVE   Ketones, ur NEGATIVE NEGATIVE mg/dL   Specific Gravity, Urine 1.025 1.005 - 1.030   Hgb urine dipstick LARGE (A) NEGATIVE   pH 6.5 5.0 - 8.0   Protein, ur 100 (A) NEGATIVE mg/dL   Urobilinogen, UA 0.2 0.0 - 1.0 mg/dL   Nitrite POSITIVE (A) NEGATIVE   Leukocytes, UA LARGE (A) NEGATIVE  Pregnancy, urine POC     Status: None   Collection Time: 07/05/14  7:46 PM  Result Value Ref Range   Preg Test, Ur NEGATIVE NEGATIVE   No results found.  Assessment and Plan: 39 y.o. female with  UTI. Culture pending. Treat with Keflex. Fluconazole case patient develops yeast infection.  Discussed warning signs or symptoms. Please see discharge instructions. Patient expresses understanding.     Rodolph BongEvan S Jacey Pelc, MD 07/05/14 2001

## 2014-07-05 NOTE — ED Notes (Signed)
Reports blood in urine today.  History of kidney stone, last one christmas eve.  One week ago left mid back.  Also noted pain, pinching feeling of meatus with urination.  Reports a lot of stress and situations preventing patient from drinking water like she should and not emptying bladder as needed.

## 2014-07-05 NOTE — Discharge Instructions (Signed)
Thank you for coming in today. If your belly pain worsens, or you have high fever, bad vomiting, blood in your stool or black tarry stool go to the Emergency Room.   Urinary Tract Infection Urinary tract infections (UTIs) can develop anywhere along your urinary tract. Your urinary tract is your body's drainage system for removing wastes and extra water. Your urinary tract includes two kidneys, two ureters, a bladder, and a urethra. Your kidneys are a pair of bean-shaped organs. Each kidney is about the size of your fist. They are located below your ribs, one on each side of your spine. CAUSES Infections are caused by microbes, which are microscopic organisms, including fungi, viruses, and bacteria. These organisms are so small that they can only be seen through a microscope. Bacteria are the microbes that most commonly cause UTIs. SYMPTOMS  Symptoms of UTIs may vary by age and gender of the patient and by the location of the infection. Symptoms in young women typically include a frequent and intense urge to urinate and a painful, burning feeling in the bladder or urethra during urination. Older women and men are more likely to be tired, shaky, and weak and have muscle aches and abdominal pain. A fever may mean the infection is in your kidneys. Other symptoms of a kidney infection include pain in your back or sides below the ribs, nausea, and vomiting. DIAGNOSIS To diagnose a UTI, your caregiver will ask you about your symptoms. Your caregiver also will ask to provide a urine sample. The urine sample will be tested for bacteria and white blood cells. White blood cells are made by your body to help fight infection. TREATMENT  Typically, UTIs can be treated with medication. Because most UTIs are caused by a bacterial infection, they usually can be treated with the use of antibiotics. The choice of antibiotic and length of treatment depend on your symptoms and the type of bacteria causing your  infection. HOME CARE INSTRUCTIONS  If you were prescribed antibiotics, take them exactly as your caregiver instructs you. Finish the medication even if you feel better after you have only taken some of the medication.  Drink enough water and fluids to keep your urine clear or pale yellow.  Avoid caffeine, tea, and carbonated beverages. They tend to irritate your bladder.  Empty your bladder often. Avoid holding urine for long periods of time.  Empty your bladder before and after sexual intercourse.  After a bowel movement, women should cleanse from front to back. Use each tissue only once. SEEK MEDICAL CARE IF:   You have back pain.  You develop a fever.  Your symptoms do not begin to resolve within 3 days. SEEK IMMEDIATE MEDICAL CARE IF:   You have severe back pain or lower abdominal pain.  You develop chills.  You have nausea or vomiting.  You have continued burning or discomfort with urination. MAKE SURE YOU:   Understand these instructions.  Will watch your condition.  Will get help right away if you are not doing well or get worse. Document Released: 11/26/2004 Document Revised: 08/18/2011 Document Reviewed: 03/27/2011 Exit[employer] Patient Information 2015 [employer]. This information is not intended to replace advice given to you by your health care provider. Make sure you discuss any questions you have with your health care provider.  

## 2014-07-08 LAB — URINE CULTURE
Colony Count: >=100000
Special Requests: NORMAL

## 2014-07-08 NOTE — ED Notes (Signed)
discussed final report of UA culture w Dr Teressa LowerE Corey, MD. Treatment adequate, no further action required

## 2014-07-16 ENCOUNTER — Other Ambulatory Visit: Payer: Self-pay | Admitting: Family Medicine

## 2014-07-16 ENCOUNTER — Ambulatory Visit (INDEPENDENT_AMBULATORY_CARE_PROVIDER_SITE_OTHER): Payer: Medicaid Other | Admitting: Family Medicine

## 2014-07-16 ENCOUNTER — Other Ambulatory Visit (HOSPITAL_COMMUNITY)
Admission: RE | Admit: 2014-07-16 | Discharge: 2014-07-16 | Disposition: A | Discharge status: Home / Self Care | Payer: Medicaid Other | Source: Ambulatory Visit | Attending: Family Medicine | Admitting: Family Medicine

## 2014-07-16 ENCOUNTER — Encounter: Payer: Self-pay | Admitting: Family Medicine

## 2014-07-16 VITALS — BP 119/87 | HR 83 | Temp 98.2°F | Wt 111.5 lb

## 2014-07-16 DIAGNOSIS — B9689 Other specified bacterial agents as the cause of diseases classified elsewhere: Secondary | ICD-10-CM

## 2014-07-16 DIAGNOSIS — A499 Bacterial infection, unspecified: Secondary | ICD-10-CM | POA: Diagnosis not present

## 2014-07-16 DIAGNOSIS — Z202 Contact with and (suspected) exposure to infections with a predominantly sexual mode of transmission: Secondary | ICD-10-CM

## 2014-07-16 DIAGNOSIS — N760 Acute vaginitis: Secondary | ICD-10-CM | POA: Diagnosis present

## 2014-07-16 DIAGNOSIS — Z113 Encounter for screening for infections with a predominantly sexual mode of transmission: Secondary | ICD-10-CM | POA: Diagnosis present

## 2014-07-16 DIAGNOSIS — N9489 Other specified conditions associated with female genital organs and menstrual cycle: Secondary | ICD-10-CM | POA: Insufficient documentation

## 2014-07-16 LAB — POCT WET PREP (WET MOUNT)
CLUE CELLS WET PREP WHIFF POC: POSITIVE
WBC, Wet Prep HPF POC: >20

## 2014-07-16 MED ORDER — METRONIDAZOLE 0.75 % VA GEL: 1.0000 | Freq: Every day | VAGINAL | Status: DC

## 2014-07-16 NOTE — Progress Notes (Signed)
Patient ID: Gail Bass L Krukowski, female   DOB: 03/25/1975, 39 y.o.   MRN: [id number]    Subjective: CC:SDA for L labia swelling and discharge HPI: Patient is a 39 y.o. female with a past medical history of recent UTI, HTN, and depression presenting to clinic today for what was initially L labial swelling, however more concerned about vaginal discharge and possible STD exposure.  VAGINAL DISCHARGE Having vaginal discharge for 2 months. Medications tried: Apple cider vinegar baths Discharge consistency: Thin to thick  Discharge color: yellow, green Recent antibiotic use: Yes, Keflex, for UTI, last dose tomorrow (Diflucan 3 days ago). Sex in last month: None Possible STD exposure: Yes, her most recent partner was unfaithful approximately 3 months ago. No intercourse in the last 3 months.  Symptoms Fever: None Dysuria:None Vaginal bleeding: None Abdomen or Pelvic pain: yes x 1months Back pain: yes x 1 month Genital sores or ulcers: Swelling of the L labia (feels this is unrelated) Rash: none Pain during sex: Yes Missed menstrual period: no, 06/17/14    LEFT LABIAL SWELLING Patient noted " outer left labial swelling" after work on Friday the size of a "vienna sausage." Throughout the day, she noted some irritation in between her thighs and in the groin region.  Adjusting her clothing helped some. That evening when she checked, the area was red and hot to touch and very painful.  She cannot tell me whether the region of swelling was diffuse or whether it was a distinct nodule at that time.  Never had anything happen like this before. She shaved after this to note if this could have been secondary to an ingrown hair but couldn't find anything. She started using Himalayan pink salt baths which seems to have improved the pain, swelling, erythema, and warmth.  There's a small hard "lump" there now but everything else has resolved. As above, she's not had any fevers/chills. No lesions noted    Social- never smoker  ROS: All other systems reviewed and are negative.  Past Medical History Patient Active Problem List   Diagnosis Date Noted  . Labial swelling 07/16/2014  . Vaginal discharge 01/10/2014  . Urinary frequency 01/10/2014  . Yeast vaginitis 01/10/2014  . Acute upper respiratory infections of unspecified site 11/10/2013  . Tonsil stone 09/28/2013  . Hypertension 05/11/2013  . Hair loss 05/02/2013  . Painful lumpy right breast 03/21/2013  . Sebaceous cyst 03/21/2013  . Smoker 03/21/2013  . ASTHMA, INTERMITTENT 12/23/2009  . OVARIAN CYST, LEFT 10/29/2008  . MIGRAINE HEADACHE 09/13/2008  . BV (bacterial vaginosis) 09/13/2008  . MENORRHAGIA 09/13/2008  . DEPRESSION 06/21/2008    Medications- reviewed and updated Current Outpatient Prescriptions  Medication Sig Dispense Refill  . albuterol (PROVENTIL HFA;VENTOLIN HFA) 108 (90 BASE) MCG/ACT inhaler Inhale 2 puffs into the lungs every 6 (six) hours as needed for wheezing or shortness of breath. (Patient not taking: Reported on 02/22/2014) 1 Inhaler 0  . amoxicillin-clavulanate (AUGMENTIN) 875-125 MG per tablet Take 1 tablet by mouth 2 (two) times daily. Starting 12/19.    . cephALEXin (KEFLEX) 500 MG capsule Take 1 capsule (500 mg total) by mouth 3 (three) times daily. 21 capsule 0  . ELDERBERRY PO Take by mouth to Post Anesthesia Care Unit.    . fluconazole (DIFLUCAN) 150 MG tablet Take 1 tablet (150 mg total) by mouth once. 1 tablet 1  . metroNIDAZOLE (METROGEL) 0.75 % vaginal gel Place 1 Applicatorful vaginally at bedtime. For 5 days total 70 g 0  . phenazopyridine (PYRIDIUM)  200 MG tablet Take 1 tablet (200 mg total) by mouth 3 (three) times daily as needed for pain (burning). 30 tablet 3  . Phenazopyridine HCl (AZO TABS PO) Take by mouth.    . spironolactone (ALDACTONE) 50 MG tablet Take 1 tablet (50 mg total) by mouth daily. (Patient not taking: Reported on 02/22/2014) 30 tablet 1  . varenicline (CHANTIX  STARTING MONTH PAK) 0.5 MG X 11 & 1 MG X 42 tablet Take one 0.5 mg tablet by mouth once daily for 3 days, then increase to one 0.5 mg tablet twice daily for 4 days, then increase to one 1 mg tablet twice daily. (Patient not taking: Reported on 02/22/2014) 53 tablet 0  . [DISCONTINUED] ibuprofen (ADVIL,MOTRIN) 800 MG tablet Take 1 tablet (800 mg total) by mouth every 8 (eight) hours as needed. 1 tab by mouth as needed for pain (Patient not taking: Reported on 02/22/2014) 30 tablet 3   No current facility-administered medications for this visit.    Objective: Office vital signs reviewed. BP 119/87 mmHg  Pulse 83  Temp(Src) 98.2 F (36.8 C) (Oral)  Wt 111 lb 8 oz (50.576 kg)   Physical Examination:  General: Awake, alert, well nourished, NAD  GI: soft, ND, mild tenderness over the suprapubic region. No CVA tenderness.  GYN:  External genitalia within normal limits except a small, hard mobile nodule approx 1mm in diameter noted deep within the superior aspect of the left labia majora (no mucosa or skin involvement). No TTP. No erythema, warmth or drainage from that area.  Vaginal mucosa pink, moist, normal rugae.  Nonfriable cervix with a few small white plaques noted (per patient, this is her normal cervical exam). Yellow vaginal discharge malodorous in nature.  No bleeding noted on speculum exam.  Bimanual exam revealed normal, nongravid uterus.  No cervical motion tenderness. No adnexal masses bilaterally.  Of note, there was some irritation (slightly erythematous) to the medial aspects of the thighs bilaterally and the buttocks as well.    Wet prep: many clue cells, + whiff test  Assessment/Plan: BV (bacterial vaginosis) Patient has had BV multiple times in the past. Per the patient's h/o, she has severe headaches (despite avoiding alcohol) with Flagyl PO in the past.  - Metronidazole gel 0.75%, 5g daily x 5 days. - Given concerns for STD exposure, RPR, HIV, GC/chlamydia are pending.  -  Patient advised that she will get a call from our office. She normally has a break from 2-3 at work.   Labial swelling Most likely due to trauma/irritation given significant improvement and presence of irritation on the medial aspects of the upper thighs.  Less likely a Bartholin cyst given the location however this would not change management. No signs of infection currently- no erythema, warmth, or drainage from the site. No fevers/chills or overlying lesions. - Patient advised to wear loose cotton clothing, changing clothing frequently to avoid excess moisture - Discussed RTC precautions: specifically the nodule becoming larger, the erythema, warmth, drainage, fever, or chills. Patient voiced understanding.     Orders Placed This Encounter  Procedures  . HIV antibody (with reflex)  . RPR  . POCT Wet Prep [facility] [place])    Meds ordered this encounter  Medications  . metroNIDAZOLE (METROGEL) 0.75 % vaginal gel    Sig: Place 1 Applicatorful vaginally at bedtime. For 5 days total    Dispense:  70 g    Refill:  0    Joanna Puffrystal S. Dejia Ebron PGY-1, [facility]

## 2014-07-16 NOTE — Assessment & Plan Note (Signed)
Patient has had BV multiple times in the past. Per the patient's h/o, she has severe headaches (despite avoiding alcohol) with Flagyl PO in the past.  - Metronidazole gel 0.75%, 5g daily x 5 days. - Given concerns for STD exposure, RPR, HIV, GC/chlamydia are pending.  - Patient advised that she will get a call from our office. She normally has a break from 2-3 at work.

## 2014-07-16 NOTE — Patient Instructions (Signed)
It was nice to meet you.  I will call you with the results of your tests  Please come back in if you notice increased swelling, tenderness, or warmth to the area.  Please return if you cannot urinate, have a fever (greater than 100.4), or increased back pain.

## 2014-07-16 NOTE — Assessment & Plan Note (Signed)
Most likely due to trauma/irritation given significant improvement and presence of irritation on the medial aspects of the upper thighs.  Less likely a Bartholin cyst given the location however this would not change management. No signs of infection currently- no erythema, warmth, or drainage from the site. No fevers/chills or overlying lesions. - Patient advised to wear loose cotton clothing, changing clothing frequently to avoid excess moisture - Discussed RTC precautions: specifically the nodule becoming larger, the erythema, warmth, drainage, fever, or chills. Patient voiced understanding.

## 2014-07-17 LAB — CERVICOVAGINAL ANCILLARY ONLY
Chlamydia: NEGATIVE
Neisseria Gonorrhea: NEGATIVE

## 2014-07-17 LAB — HIV ANTIBODY (ROUTINE TESTING W REFLEX): HIV 1&2 Ab, 4th Generation: NONREACTIVE

## 2014-07-17 LAB — RPR

## 2014-07-17 NOTE — Progress Notes (Signed)
I was preceptor for this office visit.  

## 2014-07-18 ENCOUNTER — Telehealth: Payer: Self-pay | Admitting: Family Medicine

## 2014-07-18 NOTE — Telephone Encounter (Signed)
Attempted to call pt regarding negative STD results. No answer, however I [facility] a message to [facility] (as she previously stated was fine).  She should continue to take the Flagyl gel.   Will attempt to call pt at a later time.  Joanna Puffrystal S. Dorsey, MD [facility] Resident  07/18/2014, 1:36 PM

## 2014-07-18 NOTE — Telephone Encounter (Signed)
Pt informed. Wants dr to mail her the results. Chad Donoghue Bruna PotterBlount, CMA

## 2014-07-18 NOTE — Telephone Encounter (Signed)
Pt returned call

## 2014-07-19 ENCOUNTER — Encounter: Payer: Self-pay | Admitting: Family Medicine

## 2014-10-30 ENCOUNTER — Ambulatory Visit (INDEPENDENT_AMBULATORY_CARE_PROVIDER_SITE_OTHER): Payer: Medicaid Other | Admitting: Family Medicine

## 2014-10-30 ENCOUNTER — Encounter: Payer: Self-pay | Admitting: Family Medicine

## 2014-10-30 ENCOUNTER — Other Ambulatory Visit (HOSPITAL_COMMUNITY)
Admission: RE | Admit: 2014-10-30 | Discharge: 2014-10-30 | Disposition: A | Discharge status: Home / Self Care | Payer: Medicaid Other | Source: Ambulatory Visit | Attending: Family Medicine | Admitting: Family Medicine

## 2014-10-30 VITALS — BP 145/83 | HR 71 | Temp 98.1°F | Wt 116.0 lb

## 2014-10-30 DIAGNOSIS — Z113 Encounter for screening for infections with a predominantly sexual mode of transmission: Secondary | ICD-10-CM | POA: Diagnosis not present

## 2014-10-30 DIAGNOSIS — R109 Unspecified abdominal pain: Secondary | ICD-10-CM | POA: Diagnosis not present

## 2014-10-30 DIAGNOSIS — N898 Other specified noninflammatory disorders of vagina: Secondary | ICD-10-CM | POA: Diagnosis not present

## 2014-10-30 LAB — POCT WET PREP (WET MOUNT): CLUE CELLS WET PREP WHIFF POC: NEGATIVE

## 2014-10-30 LAB — POCT UA - MICROSCOPIC ONLY

## 2014-10-30 LAB — POCT URINALYSIS DIPSTICK
BILIRUBIN UA: NEGATIVE
Glucose, UA: NEGATIVE
Ketones, UA: NEGATIVE
Leukocytes, UA: NEGATIVE
Nitrite, UA: NEGATIVE
Protein, UA: NEGATIVE
Spec Grav, UA: 1.025
Urobilinogen, UA: 0.2
pH, UA: 6

## 2014-10-30 LAB — POCT URINE PREGNANCY: Preg Test, Ur: NEGATIVE

## 2014-10-30 MED ORDER — FLUCONAZOLE 150 MG PO TABS: 150.0000 mg | ORAL_TABLET | Freq: Once | ORAL | Status: DC

## 2014-10-30 NOTE — Progress Notes (Signed)
  HPI:  Pt presents for a same day appointment to discuss vaginal discharge and R sided pelvic discomfort.   Reports recently has been having increased cramping of right lower pelvic area. Having lots of discharge. Discharge initially was yellow/green in color. No odor. Does have some itching. Has been urinating more frequently. Reports a history of kidney stone requiring removal in December. No fevers. She attempted to elicit cervical motion tenderness herself to evaluate for PID and did not find any. LMP was within the last month. Did have bad cramping with her last period. She is concerned for STDs as she is sexually active with only one female partner in the last year, but this is a long-distance relationship. During her recent intercourse the condom came off. Denies dysuria.  Also reports feeling lumps in her breast. These have been previously evaluated with a mammogram and a physician exam. They have not changed in size. They have been more tender recently.  ROS: See HPI  PMFSH: History of asthma, recurrent BV, depression, left-sided ovarian cyst  PHYSICAL EXAM: BP 145/83 mmHg  Pulse 71  Temp(Src) 98.1 F (36.7 C) (Oral)  Wt 116 lb (52.617 kg)  LMP 10/09/2014 Gen: No acute distress, pleasant, cooperative GU: normal appearing external genitalia without lesions. Vagina is moist with thick white adherent discharge. Cervix normal in appearance. No cervical motion tenderness or tenderness on bimanual exam. No adnexal masses.   ASSESSMENT/PLAN:  1. Vaginal discharge and pelvic pain: Urine pregnancy test negative today. Urinalysis not suggestive of infection. Clinically exam is consistent with yeast. Will prescribe Diflucan 150 mg 1, repeat in 3 days if not improved. Also send GC/chlamydia testing and wet prep. Patient will also have HIV and RPR drawn for full STD testing per her request. Pt requests both phone call and written copy of her labs mailed to her.  2. Breast tenderness: Did not  evaluate this today as this was a same day visit for vaginal discharge. She will follow-up with her PCP to discuss this further.  FOLLOW UP: F/u in next 1-2 weeks with PCP for breast tenderness  [place] J. Pollie Meyer, MD [facility]

## 2014-10-30 NOTE — Patient Instructions (Signed)
Sending in diflucan to treat for yeast Testing for other infections as well. I will call you with results & send in letter Follow up with Dr. Randolm Idol for your breast tenderness.  Be well, Dr. Pollie Meyer

## 2014-10-31 ENCOUNTER — Encounter: Payer: Self-pay | Admitting: Family Medicine

## 2014-10-31 LAB — CERVICOVAGINAL ANCILLARY ONLY
CHLAMYDIA, DNA PROBE: NEGATIVE
NEISSERIA GONORRHEA: NEGATIVE

## 2014-10-31 LAB — HIV ANTIBODY (ROUTINE TESTING W REFLEX): HIV 1&2 Ab, 4th Generation: NONREACTIVE

## 2014-10-31 LAB — RPR

## 2015-02-19 ENCOUNTER — Ambulatory Visit (INDEPENDENT_AMBULATORY_CARE_PROVIDER_SITE_OTHER): Payer: Medicaid Other | Admitting: Family Medicine

## 2015-02-19 ENCOUNTER — Other Ambulatory Visit (HOSPITAL_COMMUNITY)
Admission: RE | Admit: 2015-02-19 | Discharge: 2015-02-19 | Disposition: A | Discharge status: Home / Self Care | Payer: Medicaid Other | Source: Ambulatory Visit | Attending: Family Medicine | Admitting: Family Medicine

## 2015-02-19 VITALS — BP 133/86 | HR 89 | Temp 98.0°F | Wt 117.1 lb

## 2015-02-19 DIAGNOSIS — Z113 Encounter for screening for infections with a predominantly sexual mode of transmission: Secondary | ICD-10-CM | POA: Insufficient documentation

## 2015-02-19 DIAGNOSIS — N898 Other specified noninflammatory disorders of vagina: Secondary | ICD-10-CM

## 2015-02-19 DIAGNOSIS — R350 Frequency of micturition: Secondary | ICD-10-CM

## 2015-02-19 LAB — POCT URINALYSIS DIPSTICK
BILIRUBIN UA: NEGATIVE
Glucose, UA: NEGATIVE
Ketones, UA: NEGATIVE
LEUKOCYTES UA: NEGATIVE
NITRITE UA: NEGATIVE
PH UA: 6
Protein, UA: NEGATIVE
Spec Grav, UA: 1.01
UROBILINOGEN UA: 0.2

## 2015-02-19 LAB — POCT WET PREP (WET MOUNT): Clue Cells Wet Prep Whiff POC: POSITIVE

## 2015-02-19 LAB — POCT UA - MICROSCOPIC ONLY

## 2015-02-19 LAB — POCT URINE PREGNANCY: PREG TEST UR: NEGATIVE

## 2015-02-19 MED ORDER — METRONIDAZOLE 0.75 % VA GEL: 1.0000 | Freq: Every day | VAGINAL | Status: DC

## 2015-02-19 NOTE — Assessment & Plan Note (Signed)
History of recurrent UTI's  - UA

## 2015-02-19 NOTE — Patient Instructions (Signed)
Thank you for coming in,   I will call with the results.   If needed I will send in a prescription.   Sign up for My Chart to have easy access to your labs results, and communication with your Primary care physician   Please feel free to call with any questions or concerns at any time, at [phone]. --Dr. Jordan LikesSchmitz

## 2015-02-19 NOTE — Progress Notes (Signed)
   Subjective:    Patient ID: Gail Bass, female    DOB: 02/06/1976, 39 y.o.   MRN: [id number]  Seen for Same day visit for   CC: VAGINAL DISCHARGE  Had a recent unprotected intercourse  Wants to be checked. . Medications tried: vinegar and eating yogurt  Discharge consistency: thick  Discharge color: white  Recent antibiotic use: was placed amoxicillin by dentist two months  Sex in last month: 2 months  Possible STD exposure: unsure   Symptoms Fever: no Dysuria:no Vaginal bleeding: no Abdomen or Pelvic pain: no Back pain: no Genital sores or ulcers:no Rash: no Pain during sex: no Missed menstrual period: no  History of bilateral tubal ligation    Review of Systems   See HPI for ROS. Objective:  BP 133/86 mmHg  Pulse 89  Temp(Src) 98 F (36.7 C) (Oral)  Wt 117 lb 1.6 oz (53.116 kg)  LMP 02/05/2015 (Approximate)  General: NAD Respiratory:  normal effort Abdomen: soft, nontender, nondistended, no hepatic or splenomegaly. Bowel sounds present Skin: warm and dry, no rashes noted Neuro: alert and oriented, no focal deficits GU: > External: no lesions > Vagina: no blood in vault > Cervix: no lesion; no mucopurulent d/c; no motion tenderness > Adnexa: no masses; non tender      Assessment & Plan:   Urinary frequency History of recurrent UTI's  - UA    Vaginal discharge Having symptoms that are consistent with her prior bacterial vaginosis infection - Wet prep with findings consistent with a bacterial vaginosis infection. - Will send and MetroGel - Gonorrhea and chlamydia checked - Pregnancy taken

## 2015-02-19 NOTE — Assessment & Plan Note (Addendum)
Having symptoms that are consistent with her prior bacterial vaginosis infection - Wet prep with findings consistent with a bacterial vaginosis infection. - Will send and MetroGel - Gonorrhea and chlamydia checked - Pregnancy taken

## 2015-02-20 LAB — CERVICOVAGINAL ANCILLARY ONLY
CHLAMYDIA, DNA PROBE: NEGATIVE
NEISSERIA GONORRHEA: NEGATIVE

## 2015-02-26 ENCOUNTER — Encounter: Payer: Self-pay | Admitting: Family Medicine

## 2015-02-26 ENCOUNTER — Telehealth: Payer: Self-pay | Admitting: Family Medicine

## 2015-02-26 NOTE — Telephone Encounter (Signed)
Unable to leave voicemail. Will mail results.   Myra RudeJeremy E Ruthe Roemer, MD PGY-3, [facility] 02/26/2015, 8:50 AM

## 2015-03-27 ENCOUNTER — Ambulatory Visit (INDEPENDENT_AMBULATORY_CARE_PROVIDER_SITE_OTHER): Payer: Medicaid Other | Admitting: Family Medicine

## 2015-03-27 VITALS — BP 154/87 | HR 70 | Temp 98.2°F | Wt 118.7 lb

## 2015-03-27 DIAGNOSIS — J020 Streptococcal pharyngitis: Secondary | ICD-10-CM | POA: Diagnosis not present

## 2015-03-27 DIAGNOSIS — J0110 Acute frontal sinusitis, unspecified: Secondary | ICD-10-CM

## 2015-03-27 LAB — POCT RAPID STREP A (OFFICE): Rapid Strep A Screen: NEGATIVE

## 2015-03-27 MED ORDER — FLUCONAZOLE 150 MG PO TABS: ORAL_TABLET | ORAL | Status: DC

## 2015-03-27 MED ORDER — AMOXICILLIN-POT CLAVULANATE 875-125 MG PO TABS: 1.0000 | ORAL_TABLET | Freq: Two times a day (BID) | ORAL | Status: DC

## 2015-03-27 NOTE — Progress Notes (Signed)
   Subjective:    Patient ID: Gail Bass, female    DOB: [birth year], 40 y.o.   MRN: [id number]  Seen for Same day visit for   CC: sore throat.  She reports sore throat, nasal congestion, runny nose, as well as frontal headache starting on 03/13/15.  She reports some subjective fevers on and off since then.  Her daughter was diagnosed with strep throat and she took 3 days of her Omnicef , which initially helped with her symptoms.  However, her symptoms have progressively gotten worse over the past week.  She denies any difficulty breathing or swallowing.  No photophobia, vomiting or diarrhea.  Denies any history of STDs or GU symptoms.   Symptoms Fever: Subjective Cough: yes Runny nose: yes Muscle aches: no Swollen Glands: yes Trouble breathing: no Drooling: no Weight loss: no  Review of Symptoms - see HPI PMH - Current Smoker    Objective:  BP 154/87 mmHg  Pulse 70  Temp(Src) 98.2 F (36.8 C) (Oral)  Wt 118 lb 11.2 oz (53.842 kg)  General: NAD HEENT: PERRLA, EOMI. Full ROM in neck.  Mild diffuse adenopathy.  TMs clear bilaterally.  Moderate pharyngeal erythema and hyperemia with cobblestoning.  No tonsillar exudates.  No oral lesions Cardiac: RRR, normal heart sounds, no murmurs. 2+ radial and PT pulses bilaterally Respiratory: CTAB, normal effort Abdomen: soft, nontender, nondistended,  Extremities: no edema or cyanosis. WWP. Skin: warm and dry, no rashes noted   Assessment & Plan:   Sinusitis, acute frontal Sore throat, frontal HA, nasal congestion and sore throat > 10 days consistent with sinusitis.  Rapid strep negative.  No myalgias or body aches to indicate flu.  - Will treat with Augmentin 875 BID x 7 days.  - She reports yeast infections with previous antibiotics.  Given prescription for Diflucan 1503 doses to use if necessary - See AVS for return precautions - Advised smoking cessation   \

## 2015-03-27 NOTE — Assessment & Plan Note (Signed)
Sore throat, frontal HA, nasal congestion and sore throat > 10 days consistent with sinusitis.  Rapid strep negative.  No myalgias or body aches to indicate flu.  - Will treat with Augmentin 875 BID x 7 days.  - She reports yeast infections with previous antibiotics.  Given prescription for Diflucan 1503 doses to use if necessary - See AVS for return precautions - Advised smoking cessation

## 2015-03-27 NOTE — Patient Instructions (Signed)
It was great seeing you today.  You nasal congestion, headaches and sore throat consistent with sinusitis.   1. Take Augmentin twice a day for 7 days.  2. If you develop signs of use infection, you can take Diflucan 150 mg once.  If you continue to have symptoms 72 hours later take a second dose of Diflucan.  3. Continue to use warm teas with honey, nasal saline and Tylenol and ibuprofen as needed.  4. If you have worsening symptoms such as high fevers, worsening throat pain or develop difficulty swallowing or breathing then return to clinic for evaluation.  5. If your symptoms have not resolved in 10 days then return to clinic for evaluation  If you have any questions or concerns before then, please call the clinic at [phone].  Take Care,   Dr Wenda Low

## 2015-04-24 ENCOUNTER — Encounter: Payer: Self-pay | Admitting: Family Medicine

## 2015-04-24 ENCOUNTER — Ambulatory Visit (INDEPENDENT_AMBULATORY_CARE_PROVIDER_SITE_OTHER): Payer: Medicaid Other | Admitting: Family Medicine

## 2015-04-24 VITALS — BP 134/84 | HR 72 | Temp 99.0°F | Wt 117.1 lb

## 2015-04-24 DIAGNOSIS — H6592 Unspecified nonsuppurative otitis media, left ear: Secondary | ICD-10-CM | POA: Diagnosis present

## 2015-04-24 NOTE — Progress Notes (Signed)
   Subjective:    Patient ID: Gail Bass, female    DOB: 05-16-1975, 40 y.o.   MRN: [id number]  HPI  Patient presents for Same Day Appointment  CC: left ear clogged  # Left ear concern:  Had sinus infection a few weeks ago, got augmentin; improved but still having some nasal congestion  Says when she had her sinus infection she did have a little ear fullness/clogged which has continued  Does not really complain of much pain except when laying down with her right ear against pillow; in this same position she says her left ear gets louder  She says at work a bug was flying around her left ear and she tried to hit it, ended up hitting her ear, and was concerned there was a dead bug in the left ear now ROS: no fevers, some dizziness  Social Hx: former smoker  Review of Systems   See HPI for ROS.   Past medical history, surgical, family, and social history reviewed and updated in the EMR as appropriate.  Objective:  BP 134/84 mmHg  Pulse 72  Temp(Src) 99 F (37.2 C) (Oral)  Wt 117 lb 1.6 oz (53.116 kg)  LMP 03/24/2015 (Approximate) Vitals and nursing note reviewed  General: no apparent distress  ENTM: Both TMs are pearly gray without erythema, there is a small effusion on the left ear. Both external ear canals are normal in appearance. No rhinorrhea noted. Moist mucous membranes, no posterior pharyngeal erythema. Neuro: hearing is grossly normal on both sides  Assessment & Plan:  1. Middle ear effusion, left Mild, in setting of recent URI/sinus infection. Discussed observation, likely improve over next few weeks; if develops pains or hearing significantly decreases to follow up sooner. If not improved in 1 month return and may consider ENT referral.

## 2015-05-17 ENCOUNTER — Emergency Department (HOSPITAL_COMMUNITY)
Admission: EM | Admit: 2015-05-17 | Discharge: 2015-05-17 | Disposition: A | Discharge status: Home / Self Care | Payer: Medicaid Other | Source: Home / Self Care | Attending: Family Medicine | Admitting: Family Medicine

## 2015-05-17 ENCOUNTER — Encounter (HOSPITAL_COMMUNITY): Payer: Self-pay

## 2015-05-17 DIAGNOSIS — J3489 Other specified disorders of nose and nasal sinuses: Secondary | ICD-10-CM

## 2015-05-17 DIAGNOSIS — T700XXD Otitic barotrauma, subsequent encounter: Secondary | ICD-10-CM

## 2015-05-17 DIAGNOSIS — J310 Chronic rhinitis: Secondary | ICD-10-CM

## 2015-05-17 DIAGNOSIS — R071 Chest pain on breathing: Secondary | ICD-10-CM

## 2015-05-17 DIAGNOSIS — J9801 Acute bronchospasm: Secondary | ICD-10-CM | POA: Diagnosis not present

## 2015-05-17 DIAGNOSIS — H6983 Other specified disorders of Eustachian tube, bilateral: Secondary | ICD-10-CM

## 2015-05-17 DIAGNOSIS — J329 Chronic sinusitis, unspecified: Secondary | ICD-10-CM | POA: Diagnosis not present

## 2015-05-17 MED ORDER — ALBUTEROL SULFATE HFA 108 (90 BASE) MCG/ACT IN AERS: 2.0000 | INHALATION_SPRAY | RESPIRATORY_TRACT | Status: DC | PRN

## 2015-05-17 MED ORDER — TRIAMCINOLONE ACETONIDE 40 MG/ML IJ SUSP
INTRAMUSCULAR | Status: AC
  Filled 2015-05-17: qty 1

## 2015-05-17 MED ORDER — IPRATROPIUM BROMIDE 0.06 % NA SOLN: 2.0000 | Freq: Four times a day (QID) | NASAL | Status: DC

## 2015-05-17 MED ORDER — PREDNISONE 20 MG PO TABS: ORAL_TABLET | ORAL | Status: DC

## 2015-05-17 MED ORDER — IPRATROPIUM-ALBUTEROL 0.5-2.5 (3) MG/3ML IN SOLN
3.0000 mL | Freq: Once | RESPIRATORY_TRACT | Status: AC
  Administered 2015-05-17: 3 mL via RESPIRATORY_TRACT

## 2015-05-17 MED ORDER — IPRATROPIUM-ALBUTEROL 0.5-2.5 (3) MG/3ML IN SOLN
RESPIRATORY_TRACT | Status: AC
  Filled 2015-05-17: qty 3

## 2015-05-17 MED ORDER — TRIAMCINOLONE ACETONIDE 40 MG/ML IJ SUSP
40.0000 mg | Freq: Once | INTRAMUSCULAR | Status: AC
  Administered 2015-05-17: 40 mg via INTRAMUSCULAR

## 2015-05-17 NOTE — ED Notes (Signed)
40 y.o./female presents with cold symptoms x5 days  Symptoms: runny nose, headache, cough with mucus and bloody production, sides and stomach are sore Patient has been taking Nyquil and Dayquil/Zyrtec and ibuprofen No medication is helping her condition  No acute distress

## 2015-05-17 NOTE — ED Provider Notes (Signed)
CSN: [id number]     Arrival date & time 05/17/15  1507 History   First MD Initiated Contact with Patient 05/17/15 1732     Chief Complaint  Patient presents with  . Cough  . Headache   (Consider location/radiation/quality/duration/timing/severity/associated sxs/prior Treatment) HPI Comments: 40 year old female states she has been sick since January. She was diagnosed with a "sinus infection" a couple months ago and was treated symptomatically. She did not get better for a couple weeks and she went back to her PCP and was treated with Augmentin for tonsillitis. She states that her strep test were negative. She continued to complain of sore throat, cough with chest wall pain, nasal drainage, sinus stuffiness, persistent cough and discomfort to the left ear with abnormal sounds. The ear complaints have been occurring for 2 months. She was told that if her left ear was not getting any better by the middle of March that she should come back to her PCP for referral to ENT. For reasons she did not explain she decided to come to the urgent care.   Past Medical History  Diagnosis Date  . TUBAL PREGNANCY 09/13/2008    Qualifier: History of  By: Lanier Prude  MD, Cathrine Muster    . Bacterial vaginosis   . Asthma    Past Surgical History  Procedure Laterality Date  . Cystectomy    . Tailbone cyst    . Cystoscopy with retrograde pyelogram, ureteroscopy and stent placement Left 02/22/2014    Procedure: CYSTOSCOPY WITH RETROGRADE PYELOGRAM, URETEROSCOPY, STONE BASKET EXTRACTION ;  Surgeon: Kathi Ludwig, MD;  Location: [facility];  Service: Urology;  Laterality: Left;   No family history on file. Social History  Substance Use Topics  . Smoking status: Former Smoker    Start date: 11/19/2012  . Smokeless tobacco: Never Used  . Alcohol Use: No   OB History    No data available     Review of Systems  Constitutional: Positive for activity change. Negative for fever, chills, appetite change and fatigue.   HENT: Positive for congestion, ear pain, postnasal drip, rhinorrhea, sinus pressure, sore throat and voice change. Negative for facial swelling.   Eyes: Negative.   Respiratory: Positive for cough and chest tightness.   Cardiovascular: Positive for chest pain. Negative for palpitations and leg swelling.  Gastrointestinal: Negative.   Genitourinary: Negative.   Musculoskeletal: Negative for neck pain and neck stiffness.  Skin: Negative for pallor and rash.  Neurological: Negative for syncope and speech difficulty.  Psychiatric/Behavioral: Negative.   All other systems reviewed and are negative.   Allergies  Doxycycline and Lorcet  Home Medications   Prior to Admission medications   Medication Sig Start Date End Date Taking? Authorizing Provider  albuterol (PROVENTIL HFA;VENTOLIN HFA) 108 (90 Base) MCG/ACT inhaler Inhale 2 puffs into the lungs every 4 (four) hours as needed for wheezing or shortness of breath. 05/17/15   Hayden Rasmussen, NP  ipratropium (ATROVENT) 0.06 % nasal spray Place 2 sprays into both nostrils 4 (four) times daily. 05/17/15   Hayden Rasmussen, NP  predniSONE (DELTASONE) 20 MG tablet 3 Tabs PO Days 1-3, then 2 tabs PO Days 4-6, then 1 tab PO Day 7-9, then Half Tab PO Day 10-12 05/17/15   Hayden Rasmussen, NP   Meds Ordered and Administered this Visit   Medications  ipratropium-albuterol (DUONEB) 0.5-2.5 (3) MG/3ML nebulizer solution 3 mL (3 mLs Nebulization Given 05/17/15 1801)  triamcinolone acetonide (KENALOG-40) injection 40 mg (40 mg Intramuscular Given 05/17/15 1801)  BP 159/96 mmHg  Pulse 66  Temp(Src) 98 F (36.7 C)  Resp 16  SpO2 100%  LMP 04/24/2015 (Exact Date) No data found.   Physical Exam  Constitutional: She is oriented to person, place, and time. She appears well-developed and well-nourished. No distress.  HENT:  Head: Normocephalic.  Mouth/Throat: No oropharyngeal exudate.  Minor erythema/injection of the posterior pharynx, cobblestoning and clear  PND. Bilateral TMs with retraction. No erythema.  Eyes: Conjunctivae and EOM are normal.  Neck: Normal range of motion. Neck supple.  Cardiovascular: Normal rate, regular rhythm and intact distal pulses.   Pulmonary/Chest: Effort normal. No respiratory distress. She has wheezes. She has no rales. She exhibits tenderness.  Mildly prolonged expiratory phase. Forced expiration and cough produces bilateral wheezing.  Musculoskeletal: Normal range of motion. She exhibits no edema.  Lymphadenopathy:    She has no cervical adenopathy.  Neurological: She is alert and oriented to person, place, and time. No cranial nerve deficit. She exhibits normal muscle tone.  Skin: Skin is warm and dry.  Psychiatric: She has a normal mood and affect.  Nursing note and vitals reviewed.   ED Course  Procedures (including critical care time)  Labs Review Labs Reviewed - No data to display  Imaging Review No results found.   Visual Acuity Review  Right Eye Distance:   Left Eye Distance:   Bilateral Distance:    Right Eye Near:   Left Eye Near:    Bilateral Near:         MDM   1. Pain of anterior chest wall with respiration   2. Rhinosinusitis   3. Sinus drainage   4. Cough due to bronchospasm   5. Barotitis media, subsequent encounter   6. ETD (eustachian tube dysfunction), bilateral     For nasal and head congestion may take Sudafed PE 10 mg every 4 hours as needed. Saline nasal spray used frequently. For drainage may use Allegra, Claritin or Zyrtec. If you need stronger medicine to stop drainage may take Chlor-Trimeton 2-4 mg every 4 hours. This may cause drowsiness. Ibuprofen 600 mg every 6 hours as needed for pain, discomfort or fever. Drink plenty of fluids and stay well-hydrated. Meds ordered this encounter  Medications  . ipratropium-albuterol (DUONEB) 0.5-2.5 (3) MG/3ML nebulizer solution 3 mL    Sig:   . triamcinolone acetonide (KENALOG-40) injection 40 mg    Sig:   .  albuterol (PROVENTIL HFA;VENTOLIN HFA) 108 (90 Base) MCG/ACT inhaler    Sig: Inhale 2 puffs into the lungs every 4 (four) hours as needed for wheezing or shortness of breath.    Dispense:  1 Inhaler    Refill:  0    Order Specific Question:  Supervising Provider    Answer:  Bradd Canary D [id number]  . predniSONE (DELTASONE) 20 MG tablet    Sig: 3 Tabs PO Days 1-3, then 2 tabs PO Days 4-6, then 1 tab PO Day 7-9, then Half Tab PO Day 10-12    Dispense:  20 tablet    Refill:  0    Order Specific Question:  Supervising Provider    Answer:  Linna Hoff [phone]  . ipratropium (ATROVENT) 0.06 % nasal spray    Sig: Place 2 sprays into both nostrils 4 (four) times daily.    Dispense:  15 mL    Refill:  12    Order Specific Question:  Supervising Provider    Answer:  Linna Hoff [phone]   Post DuoNeb patient states  that she feels better and breathing better. Auscultation reveals clear lungs. No wheezing. Chest expansion good air movement. No coughing with deep inspiration.   Hayden Rasmussenavid Shalae Belmonte, NP 05/17/15 [phone]

## 2015-05-17 NOTE — Discharge Instructions (Signed)
Bronchospasm, Adult Prednisone and Albuterol HFA. THis should help a lot. A bronchospasm is a spasm or tightening of the airways going into the lungs. During a bronchospasm breathing becomes more difficult because the airways get smaller. When this happens there can be coughing, a whistling sound when breathing (wheezing), and difficulty breathing. Bronchospasm is often associated with asthma, but not all patients who experience a bronchospasm have asthma. CAUSES  A bronchospasm is caused by inflammation or irritation of the airways. The inflammation or irritation may be triggered by:   Allergies (such as to animals, pollen, food, or mold). Allergens that cause bronchospasm may cause wheezing immediately after exposure or many hours later.   Infection. Viral infections are believed to be the most common cause of bronchospasm.   Exercise.   Irritants (such as pollution, cigarette smoke, strong odors, aerosol sprays, and paint fumes).   Weather changes. Winds increase molds and pollens in the air. Rain refreshes the air by washing irritants out. Cold air may cause inflammation.   Stress and emotional upset.  SIGNS AND SYMPTOMS   Wheezing.   Excessive nighttime coughing.   Frequent or severe coughing with a simple cold.   Chest tightness.   Shortness of breath.  DIAGNOSIS  Bronchospasm is usually diagnosed through a history and physical exam. Tests, such as chest X-rays, are sometimes done to look for other conditions. TREATMENT   Inhaled medicines can be given to open up your airways and help you breathe. The medicines can be given using either an inhaler or a nebulizer machine.  Corticosteroid medicines may be given for severe bronchospasm, usually when it is associated with asthma. HOME CARE INSTRUCTIONS   Always have a plan prepared for seeking medical care. Know when to call your health care provider and local emergency services (911 in the [place] Know where you can  access local emergency care.  Only take medicines as directed by your health care provider.  If you were prescribed an inhaler or nebulizer machine, ask your health care provider to explain how to use it correctly. Always use a spacer with your inhaler if you were given one.  It is necessary to remain calm during an attack. Try to relax and breathe more slowly.  Control your home environment in the following ways:   Change your heating and air conditioning filter at least once a month.   Limit your use of fireplaces and wood stoves.  Do not smoke and do not allow smoking in your home.   Avoid exposure to perfumes and fragrances.   Get rid of pests (such as roaches and mice) and their droppings.   Throw away plants if you see mold on them.   Keep your house clean and dust free.   Replace carpet with wood, tile, or vinyl flooring. Carpet can trap dander and dust.   Use allergy-proof pillows, mattress covers, and box spring covers.   Wash bed sheets and blankets every week in hot water and dry them in a dryer.   Use blankets that are made of polyester or cotton.   Wash hands frequently. SEEK MEDICAL CARE IF:   You have muscle aches.   You have chest pain.   The sputum changes from clear or white to yellow, green, gray, or bloody.   The sputum you cough up gets thicker.   There are problems that may be related to the medicine you are given, such as a rash, itching, swelling, or trouble breathing.  SEEK IMMEDIATE MEDICAL CARE  IF:   You have worsening wheezing and coughing even after taking your prescribed medicines.   You have increased difficulty breathing.   You develop severe chest pain. MAKE SURE YOU:   Understand these instructions.  Will watch your condition.  Will get help right away if you are not doing well or get worse.   This information is not intended to replace advice given to you by your health care provider. Make sure you  discuss any questions you have with your health care provider.   Document Released: 02/19/2003 Document Revised: 03/09/2014 Document Reviewed: 08/08/2012 Elsevier Interactive Patient Education 2016 Elsevier Inc.  Chest Wall Pain Chest wall pain is pain in or around the bones and muscles of your chest. Sometimes, an injury causes this pain. Sometimes, the cause may not be known. This pain may take several weeks or longer to get better. HOME CARE INSTRUCTIONS  Pay attention to any changes in your symptoms. Take these actions to help with your pain:   Rest as told by your health care provider.   Avoid activities that cause pain. These include any activities that use your chest muscles or your abdominal and side muscles to lift heavy items.   If directed, apply ice to the painful area:  Put ice in a plastic bag.  Place a towel between your skin and the bag.  Leave the ice on for 20 minutes, 2-3 times per day.  Take over-the-counter and prescription medicines only as told by your health care provider.  Do not use tobacco products, including cigarettes, chewing tobacco, and e-cigarettes. If you need help quitting, ask your health care provider.  Keep all follow-up visits as told by your health care provider. This is important. SEEK MEDICAL CARE IF:  You have a fever.  Your chest pain becomes worse.  You have new symptoms. SEEK IMMEDIATE MEDICAL CARE IF:  You have nausea or vomiting.  You feel sweaty or light-headed.  You have a cough with phlegm (sputum) or you cough up blood.  You develop shortness of breath.   This information is not intended to replace advice given to you by your health care provider. Make sure you discuss any questions you have with your health care provider.   Document Released: 02/16/2005 Document Revised: 11/07/2014 Document Reviewed: 05/14/2014 Elsevier Interactive Patient Education 2016 [employer].  How to Use an Inhaler Using your inhaler  correctly is very important. Good technique will make sure that the medicine reaches your lungs.  HOW TO USE AN INHALER:  Take the cap off the inhaler.  If this is the first time using your inhaler, you need to prime it. Shake the inhaler for 5 seconds. Release four puffs into the air, away from your face. Ask your doctor for help if you have questions.  Shake the inhaler for 5 seconds.  Turn the inhaler so the bottle is above the mouthpiece.  Put your pointer finger on top of the bottle. Your thumb holds the bottom of the inhaler.  Open your mouth.  Either hold the inhaler away from your mouth (the width of 2 fingers) or place your lips tightly around the mouthpiece. Ask your doctor which way to use your inhaler.  Breathe out as much air as possible.  Breathe in and push down on the bottle 1 time to release the medicine. You will feel the medicine go in your mouth and throat.  Continue to take a deep breath in very slowly. Try to fill your lungs.  After you have breathed in completely, hold your breath for 10 seconds. This will help the medicine to settle in your lungs. If you cannot hold your breath for 10 seconds, hold it for as long as you can before you breathe out.  Breathe out slowly, through pursed lips. Whistling is an example of pursed lips.  If your doctor has told you to take more than 1 puff, wait at least 15-30 seconds between puffs. This will help you get the best results from your medicine. Do not use the inhaler more than your doctor tells you to.  Put the cap back on the inhaler.  Follow the directions from your doctor or from the inhaler package about cleaning the inhaler. If you use more than one inhaler, ask your doctor which inhalers to use and what order to use them in. Ask your doctor to help you figure out when you will need to refill your inhaler.  If you use a steroid inhaler, always rinse your mouth with water after your last puff, gargle and spit out the  water. Do not swallow the water. GET HELP IF:  The inhaler medicine only partially helps to stop wheezing or shortness of breath.  You are having trouble using your inhaler.  You have some increase in thick spit (phlegm). GET HELP RIGHT AWAY IF:  The inhaler medicine does not help your wheezing or shortness of breath or you have tightness in your chest.  You have dizziness, headaches, or fast heart rate.  You have chills, fever, or night sweats.  You have a large increase of thick spit, or your thick spit is bloody. MAKE SURE YOU:   Understand these instructions.  Will watch your condition.  Will get help right away if you are not doing well or get worse.   This information is not intended to replace advice given to you by your health care provider. Make sure you discuss any questions you have with your health care provider.   Document Released: 11/26/2007 Document Revised: 12/07/2012 Document Reviewed: 09/15/2012 Elsevier Interactive Patient Education 2016 Elsevier Inc.  Upper Respiratory Infection, Adult  Rhinosinusitis For nasal and head congestion may take Sudafed PE 10 mg every 4 hours as needed. Saline nasal spray used frequently. For drainage may use Allegra, Claritin or Zyrtec. If you need stronger medicine to stop drainage may take Chlor-Trimeton 2-4 mg every 4 hours. This may cause drowsiness. Ibuprofen 600 mg every 6 hours as needed for pain, discomfort or fever. Drink plenty of fluids and stay well-hydrated.  Most upper respiratory infections (URIs) are a viral infection of the air passages leading to the lungs. A URI affects the nose, throat, and upper air passages. The most common type of URI is nasopharyngitis and is typically referred to as "the common cold." URIs run their course and usually go away on their own. Most of the time, a URI does not require medical attention, but sometimes a bacterial infection in the upper airways can follow a viral infection.  This is called a secondary infection. Sinus and middle ear infections are common types of secondary upper respiratory infections. Bacterial pneumonia can also complicate a URI. A URI can worsen asthma and chronic obstructive pulmonary disease (COPD). Sometimes, these complications can require emergency medical care and may be life threatening.  CAUSES Almost all URIs are caused by viruses. A virus is a type of germ and can spread from one person to another.  RISKS FACTORS You may be at risk for a  URI if:   You smoke.   You have chronic heart or lung disease.  You have a weakened defense (immune) system.   You are very young or very old.   You have nasal allergies or asthma.  You work in crowded or poorly ventilated areas.  You work in health care facilities or schools. SIGNS AND SYMPTOMS  Symptoms typically develop 2-3 days after you come in contact with a cold virus. Most viral URIs last 7-10 days. However, viral URIs from the influenza virus (flu virus) can last 14-18 days and are typically more severe. Symptoms may include:   Runny or stuffy (congested) nose.   Sneezing.   Cough.   Sore throat.   Headache.   Fatigue.   Fever.   Loss of appetite.   Pain in your forehead, behind your eyes, and over your cheekbones (sinus pain).  Muscle aches.  DIAGNOSIS  Your health care provider may diagnose a URI by:  Physical exam.  Tests to check that your symptoms are not due to another condition such as:  Strep throat.  Sinusitis.  Pneumonia.  Asthma. TREATMENT  A URI goes away on its own with time. It cannot be cured with medicines, but medicines may be prescribed or recommended to relieve symptoms. Medicines may help:  Reduce your fever.  Reduce your cough.  Relieve nasal congestion. HOME CARE INSTRUCTIONS   Take medicines only as directed by your health care provider.   Gargle warm saltwater or take cough drops to comfort your throat as  directed by your health care provider.  Use a warm mist humidifier or inhale steam from a shower to increase air moisture. This may make it easier to breathe.  Drink enough fluid to keep your urine clear or pale yellow.   Eat soups and other clear broths and maintain good nutrition.   Rest as needed.   Return to work when your temperature has returned to normal or as your health care provider advises. You may need to stay home longer to avoid infecting others. You can also use a face mask and careful hand washing to prevent spread of the virus.  Increase the usage of your inhaler if you have asthma.   Do not use any tobacco products, including cigarettes, chewing tobacco, or electronic cigarettes. If you need help quitting, ask your health care provider. PREVENTION  The best way to protect yourself from getting a cold is to practice good hygiene.   Avoid oral or hand contact with people with cold symptoms.   Wash your hands often if contact occurs.  There is no clear evidence that vitamin C, vitamin E, echinacea, or exercise reduces the chance of developing a cold. However, it is always recommended to get plenty of rest, exercise, and practice good nutrition.  SEEK MEDICAL CARE IF:   You are getting worse rather than better.   Your symptoms are not controlled by medicine.   You have chills.  You have worsening shortness of breath.  You have brown or red mucus.  You have yellow or brown nasal discharge.  You have pain in your face, especially when you bend forward.  You have a fever.  You have swollen neck glands.  You have pain while swallowing.  You have white areas in the back of your throat. SEEK IMMEDIATE MEDICAL CARE IF:   You have severe or persistent:  Headache.  Ear pain.  Sinus pain.  Chest pain.  You have chronic lung disease and any of  the following:  Wheezing.  Prolonged cough.  Coughing up blood.  A change in your usual  mucus.  You have a stiff neck.  You have changes in your:  Vision.  Hearing.  Thinking.  Mood. MAKE SURE YOU:   Understand these instructions.  Will watch your condition.  Will get help right away if you are not doing well or get worse.   This information is not intended to replace advice given to you by your health care provider. Make sure you discuss any questions you have with your health care provider.   Document Released: 08/12/2000 Document Revised: 07/03/2014 Document Reviewed: 05/24/2013 Elsevier Interactive Patient Education [employer]2016 Elsevier Inc.

## 2015-05-31 ENCOUNTER — Emergency Department (HOSPITAL_COMMUNITY)
Admission: EM | Admit: 2015-05-31 | Discharge: 2015-05-31 | Disposition: A | Discharge status: Home / Self Care | Payer: Medicaid Other | Attending: Emergency Medicine | Admitting: Emergency Medicine

## 2015-05-31 ENCOUNTER — Emergency Department (HOSPITAL_COMMUNITY): Payer: Medicaid Other

## 2015-05-31 ENCOUNTER — Encounter (HOSPITAL_COMMUNITY): Payer: Self-pay | Admitting: Emergency Medicine

## 2015-05-31 DIAGNOSIS — R109 Unspecified abdominal pain: Secondary | ICD-10-CM | POA: Diagnosis not present

## 2015-05-31 DIAGNOSIS — Z9851 Tubal ligation status: Secondary | ICD-10-CM | POA: Insufficient documentation

## 2015-05-31 DIAGNOSIS — Z87442 Personal history of urinary calculi: Secondary | ICD-10-CM | POA: Diagnosis not present

## 2015-05-31 DIAGNOSIS — Z79899 Other long term (current) drug therapy: Secondary | ICD-10-CM | POA: Insufficient documentation

## 2015-05-31 DIAGNOSIS — F17200 Nicotine dependence, unspecified, uncomplicated: Secondary | ICD-10-CM | POA: Diagnosis not present

## 2015-05-31 DIAGNOSIS — Z8742 Personal history of other diseases of the female genital tract: Secondary | ICD-10-CM | POA: Diagnosis not present

## 2015-05-31 DIAGNOSIS — J45909 Unspecified asthma, uncomplicated: Secondary | ICD-10-CM | POA: Insufficient documentation

## 2015-05-31 HISTORY — DX: Calculus of kidney: N20.0

## 2015-05-31 LAB — URINALYSIS, ROUTINE W REFLEX MICROSCOPIC
Bilirubin Urine: NEGATIVE
GLUCOSE, UA: NEGATIVE mg/dL
KETONES UR: NEGATIVE mg/dL
LEUKOCYTES UA: NEGATIVE
Nitrite: NEGATIVE
PH: 6.5 (ref 5.0–8.0)
Protein, ur: NEGATIVE mg/dL
SPECIFIC GRAVITY, URINE: 1.008 (ref 1.005–1.030)

## 2015-05-31 LAB — URINE MICROSCOPIC-ADD ON
Squamous Epithelial / LPF: NONE SEEN
WBC UA: NONE SEEN WBC/hpf (ref 0–5)

## 2015-05-31 MED ORDER — MORPHINE SULFATE (PF) 4 MG/ML IV SOLN
4.0000 mg | Freq: Once | INTRAVENOUS | Status: AC
Start: 2015-05-31 — End: 2015-05-31
  Administered 2015-05-31: 4 mg via INTRAVENOUS
  Filled 2015-05-31: qty 1

## 2015-05-31 MED ORDER — ONDANSETRON HCL 4 MG/2ML IJ SOLN
4.0000 mg | Freq: Once | INTRAMUSCULAR | Status: AC
  Administered 2015-05-31: 4 mg via INTRAVENOUS
  Filled 2015-05-31: qty 2

## 2015-05-31 MED ORDER — SODIUM CHLORIDE 0.9 % IV SOLN
INTRAVENOUS | Status: DC
  Administered 2015-05-31: 20 mL/h via INTRAVENOUS

## 2015-05-31 MED ORDER — KETOROLAC TROMETHAMINE 30 MG/ML IJ SOLN
30.0000 mg | Freq: Once | INTRAMUSCULAR | Status: AC
  Administered 2015-05-31: 30 mg via INTRAVENOUS
  Filled 2015-05-31: qty 1

## 2015-05-31 MED ORDER — IBUPROFEN 600 MG PO TABS: 600.0000 mg | ORAL_TABLET | Freq: Four times a day (QID) | ORAL | Status: DC | PRN

## 2015-05-31 MED ORDER — METHOCARBAMOL 750 MG PO TABS: 750.0000 mg | ORAL_TABLET | Freq: Four times a day (QID) | ORAL | Status: DC

## 2015-05-31 NOTE — Discharge Instructions (Signed)
Flank Pain °Flank pain refers to pain that is located on the side of the body between the upper abdomen and the back. The pain may occur over a short period of time (acute) or may be long-term or reoccurring (chronic). It may be mild or severe. Flank pain can be caused by many things. °CAUSES  °Some of the more common causes of flank pain include: °· Muscle strains.   °· Muscle spasms.   °· A disease of your spine (vertebral disk disease).   °· A lung infection (pneumonia).   °· Fluid around your lungs (pulmonary edema).   °· A kidney infection.   °· Kidney stones.   °· A very painful skin rash caused by the chickenpox virus (shingles).   °· Gallbladder disease.   °HOME CARE INSTRUCTIONS  °Home care will depend on the cause of your pain. In general, °· Rest as directed by your caregiver. °· Drink enough fluids to keep your urine clear or pale yellow. °· Only take over-the-counter or prescription medicines as directed by your caregiver. Some medicines may help relieve the pain. °· Tell your caregiver about any changes in your pain. °· Follow up with your caregiver as directed. °SEEK IMMEDIATE MEDICAL CARE IF:  °· Your pain is not controlled with medicine.   °· You have new or worsening symptoms. °· Your pain increases.   °· You have abdominal pain.   °· You have shortness of breath.   °· You have persistent nausea or vomiting.   °· You have swelling in your abdomen.   °· You feel faint or pass out.   °· You have blood in your urine. °· You have a fever or persistent symptoms for more than 2-3 days. °· You have a fever and your symptoms suddenly get worse. °MAKE SURE YOU:  °· Understand these instructions. °· Will watch your condition. °· Will get help right away if you are not doing well or get worse. °  °This information is not intended to replace advice given to you by your health care provider. Make sure you discuss any questions you have with your health care provider. °  °Document Released: 04/09/2005 Document  Revised: 11/11/2011 Document Reviewed: 10/01/2011 °Elsevier Interactive Patient Education ©2016 Elsevier Inc. ° °

## 2015-05-31 NOTE — ED Notes (Signed)
Pt states about 2 weeks ago she started feeling like she had a UTI so she increased her water intake  4 days ago started having nausea, two days ago started having vomiting and this morning at 4am started having left flank pain that is intense and sharp

## 2015-05-31 NOTE — ED Provider Notes (Signed)
CSN: [id number]     Arrival date & time 05/31/15  [id number] History   First MD Initiated Contact with Patient 05/31/15 [phone]     Chief Complaint  Patient presents with  . Flank Pain     (Consider location/radiation/quality/duration/timing/severity/associated sxs/prior Treatment) HPI Comments: Pt has h/o kidney stones Left flank pain x 2 weeks, colicky--pain radiates to left groin No fever but emesis w/ pain--nausea No hematuria On menses now No treatment used pta  Patient is a 40 y.o. female presenting with flank pain. The history is provided by the patient.  Flank Pain This is a recurrent problem. The current episode started more than 1 week ago. The problem occurs constantly. Associated symptoms include abdominal pain. Nothing aggravates the symptoms. Nothing relieves the symptoms. She has tried nothing for the symptoms.    Past Medical History  Diagnosis Date  . TUBAL PREGNANCY 09/13/2008    Qualifier: History of  By: Lanier PrudeBolden  MD, Cathrine Musteraineisha    . Bacterial vaginosis   . Asthma   . Kidney stone    Past Surgical History  Procedure Laterality Date  . Cystectomy    . Tailbone cyst    . Cystoscopy with retrograde pyelogram, ureteroscopy and stent placement Left 02/22/2014    Procedure: CYSTOSCOPY WITH RETROGRADE PYELOGRAM, URETEROSCOPY, STONE BASKET EXTRACTION ;  Surgeon: Kathi LudwigSigmund I Tannenbaum, MD;  Location: [facility];  Service: Urology;  Laterality: Left;  . Tubal ligation     Family History  Problem Relation Age of Onset  . Diabetes Other   . CAD Other    Social History  Substance Use Topics  . Smoking status: Current Some Day Smoker    Start date: 11/19/2012  . Smokeless tobacco: Never Used  . Alcohol Use: No   OB History    No data available     Review of Systems  Gastrointestinal: Positive for abdominal pain.  Genitourinary: Positive for flank pain.  All other systems reviewed and are negative.     Allergies  Doxycycline and Lorcet  Home Medications   Prior  to Admission medications   Medication Sig Start Date End Date Taking? Authorizing Provider  albuterol (PROVENTIL HFA;VENTOLIN HFA) 108 (90 Base) MCG/ACT inhaler Inhale 2 puffs into the lungs every 4 (four) hours as needed for wheezing or shortness of breath. 05/17/15   Hayden Rasmussenavid Mabe, NP  ipratropium (ATROVENT) 0.06 % nasal spray Place 2 sprays into both nostrils 4 (four) times daily. 05/17/15   Hayden Rasmussenavid Mabe, NP  predniSONE (DELTASONE) 20 MG tablet 3 Tabs PO Days 1-3, then 2 tabs PO Days 4-6, then 1 tab PO Day 7-9, then Half Tab PO Day 10-12 05/17/15   Hayden Rasmussenavid Mabe, NP   BP 161/80 mmHg  Pulse 78  Temp(Src) 97.9 F (36.6 C) (Oral)  Resp 18  Ht 5' 2.75" (1.594 m)  Wt 53.071 kg  BMI 20.89 kg/m2  SpO2 97%  LMP 05/26/2015 (Exact Date) Physical Exam  Constitutional: She is oriented to person, place, and time. She appears well-developed and well-nourished.  Non-toxic appearance. No distress.  HENT:  Head: Normocephalic and atraumatic.  Eyes: Conjunctivae, EOM and lids are normal. Pupils are equal, round, and reactive to light.  Neck: Normal range of motion. Neck supple. No tracheal deviation present. No thyroid mass present.  Cardiovascular: Normal rate, regular rhythm and normal heart sounds.  Exam reveals no gallop.   No murmur heard. Pulmonary/Chest: Effort normal and breath sounds normal. No stridor. No respiratory distress. She has no decreased breath sounds. She has  no wheezes. She has no rhonchi. She has no rales.  Abdominal: Soft. Normal appearance and bowel sounds are normal. She exhibits no distension. There is no tenderness. There is CVA tenderness. There is no rigidity, no rebound and no guarding.  Musculoskeletal: Normal range of motion. She exhibits no edema or tenderness.  Neurological: She is alert and oriented to person, place, and time. She has normal strength. No cranial nerve deficit or sensory deficit. GCS eye subscore is 4. GCS verbal subscore is 5. GCS motor subscore is 6.  Skin:  Skin is warm and dry. No abrasion and no rash noted.  Psychiatric: She has a normal mood and affect. Her speech is normal and behavior is normal.  Nursing note and vitals reviewed.   ED Course  Procedures (including critical care time) Labs Review Labs Reviewed - No data to display  Imaging Review No results found. I have personally reviewed and evaluated these images and lab results as part of my medical decision-making.   EKG Interpretation None      MDM   Final diagnoses:  None    Patient given pain medicine feels better at this time. Will be treated for symptomatic likely musculoskeletal pain    Lorre Nick, MD 05/31/15 1027

## 2015-06-01 LAB — URINE CULTURE: Culture: 1000

## 2015-06-05 ENCOUNTER — Ambulatory Visit (INDEPENDENT_AMBULATORY_CARE_PROVIDER_SITE_OTHER): Payer: Medicaid Other | Admitting: Family Medicine

## 2015-06-05 ENCOUNTER — Encounter: Payer: Self-pay | Admitting: Family Medicine

## 2015-06-05 ENCOUNTER — Ambulatory Visit (HOSPITAL_COMMUNITY)
Admission: RE | Admit: 2015-06-05 | Discharge: 2015-06-05 | Disposition: A | Discharge status: Home / Self Care | Payer: Medicaid Other | Source: Ambulatory Visit | Attending: Family Medicine | Admitting: Family Medicine

## 2015-06-05 ENCOUNTER — Other Ambulatory Visit (HOSPITAL_COMMUNITY)
Admission: RE | Admit: 2015-06-05 | Discharge: 2015-06-05 | Disposition: A | Discharge status: Home / Self Care | Payer: Medicaid Other | Source: Ambulatory Visit | Attending: Family Medicine | Admitting: Family Medicine

## 2015-06-05 ENCOUNTER — Ambulatory Visit
Admission: RE | Admit: 2015-06-05 | Discharge: 2015-06-05 | Disposition: A | Discharge status: Home / Self Care | Payer: Medicaid Other | Source: Ambulatory Visit | Attending: Family Medicine | Admitting: Family Medicine

## 2015-06-05 VITALS — BP 154/96 | HR 58 | Temp 98.4°F | Ht 62.0 in | Wt 123.2 lb

## 2015-06-05 DIAGNOSIS — R61 Generalized hyperhidrosis: Secondary | ICD-10-CM

## 2015-06-05 DIAGNOSIS — N760 Acute vaginitis: Secondary | ICD-10-CM | POA: Insufficient documentation

## 2015-06-05 DIAGNOSIS — J329 Chronic sinusitis, unspecified: Secondary | ICD-10-CM | POA: Diagnosis present

## 2015-06-05 DIAGNOSIS — R079 Chest pain, unspecified: Secondary | ICD-10-CM | POA: Diagnosis not present

## 2015-06-05 DIAGNOSIS — Z113 Encounter for screening for infections with a predominantly sexual mode of transmission: Secondary | ICD-10-CM | POA: Diagnosis not present

## 2015-06-05 DIAGNOSIS — N898 Other specified noninflammatory disorders of vagina: Secondary | ICD-10-CM | POA: Diagnosis not present

## 2015-06-05 DIAGNOSIS — R001 Bradycardia, unspecified: Secondary | ICD-10-CM | POA: Diagnosis not present

## 2015-06-05 LAB — CBC WITH DIFFERENTIAL/PLATELET
BASOS ABS: 0 {cells}/uL (ref 0–200)
Basophils Relative: 0 %
EOS ABS: 76 {cells}/uL (ref 15–500)
Eosinophils Relative: 1 %
HCT: 37.9 % (ref 35.0–45.0)
HEMOGLOBIN: 12.8 g/dL (ref 11.7–15.5)
Lymphocytes Relative: 19 %
Lymphs Abs: 1444 cells/uL (ref 850–3900)
MCH: 32.3 pg (ref 27.0–33.0)
MCHC: 33.8 g/dL (ref 32.0–36.0)
MCV: 95.7 fL (ref 80.0–100.0)
MONOS PCT: 5 %
MPV: 9.1 fL (ref 7.5–12.5)
Monocytes Absolute: 380 cells/uL (ref 200–950)
NEUTROS ABS: 5700 {cells}/uL (ref 1500–7800)
NEUTROS PCT: 75 %
PLATELETS: 212 10*3/uL (ref 140–400)
RBC: 3.96 MIL/uL (ref 3.80–5.10)
RDW: 12.9 % (ref 11.0–15.0)
WBC: 7.6 10*3/uL (ref 3.8–10.8)

## 2015-06-05 LAB — POCT WET PREP (WET MOUNT): CLUE CELLS WET PREP WHIFF POC: NEGATIVE

## 2015-06-05 LAB — POCT URINE PREGNANCY: Preg Test, Ur: NEGATIVE

## 2015-06-05 NOTE — Patient Instructions (Addendum)
Referring to ENT and cardiology  Checking for STDs Also checking lots of labs today I'll call you or send you a letter with results  If you have any chest pain that does not go away within 30 minutes, is accompanied by nausea, sweating, shortness of breath, or made worse by activity, go to the emergency room immediately for evaluation.   Please follow up with Dr. Randolm IdolFletke in 3-4 weeks for these issues.  Be well, Dr. Pollie MeyerMcIntyre

## 2015-06-05 NOTE — Progress Notes (Signed)
Date of Visit: 06/05/2015   HPI:  Patient presents today for multiple concerns:  - STD check - wants STD testing. No new partners since last tested. In a long distance relationship. Uses condoms but a couple of times they've fallen off. Endorses some crampy low pelvic pain. Also has odorous vaginal discharge.  - recurrent URI - has been sick for 2 months. Did a course of Augmentin without much relief. Coughs a lot. Had fever on and off when she first got sick, now no fevers. Feels very tired. Having some night sweats. Ipratropium nasal spray has helped with rhinorrhea. Seen at urgent care and someone recommended referral to ENT. Hears sound in ear at night. Was told she has eustachian tube dysfunction.  - chest pain - endorses feeling pressure in her chest when she exercises. Worse with exertion, resolves with rest. Associated with shortness of breath. Sister required a pacemaker at age 38 and died. Other family members have also had heart attacks before.   ROS: See HPI.  PMFSH: history of hypertension, smoking, recurrent bacterial vaginosis, asthma, depression  PHYSICAL EXAM: BP 154/96 mmHg  Pulse 58  Temp(Src) 98.4 F (36.9 C) (Oral)  Ht 5\' 2"  (1.575 m)  Wt 123 lb 3.2 oz (55.883 kg)  BMI 22.53 kg/m2  LMP 05/26/2015 Gen: NAD, pleasant, cooperative HEENT: normocephalic, atraumatic. Nares with some discharge. Tympanic membranes clear bilaterally, not retracted or bulging. No anterior cervical or supraclavicular lymphadenopathy.  Heart: regular rate and rhythm, no murmur Lungs: clear to auscultation bilaterally, normal work of breathing, no crackles or wheezes Neuro: alert, grossly nonfocal, speech normal Ext: No appreciable lower extremity edema bilaterally   ASSESSMENT/PLAN:  Recurrent sinusitis Sick  With URI symptoms for at least 2 months. Well appearing today on the whole. Warrants laboratory evaluation and ENT referral for anatomical evaluation. Plan: - labs: HIV antibody,  TSH, CBC diff, peripheral smear, CMET - CXR - referral to ENT  Pain in the chest With positive family history, history of hypertension, cigarette smoking, and typical symptoms of pressure in chest brought on by exertion and relieved by rest, warrants cardiology evaluation. EKG normal today. Will refer to cardiology for further evaluation, anticipate need for stress test. Counseled on reasons to seek emergency care.  Vaginal discharge Check wet prep for vaginal discharge STD screening: will check gc/chlamydia/trichomonas, HIV, RPR, acute hepatitis panel today    FOLLOW UP: Follow up in 3-4 weeks with PCP for above issues Referring to cardiology Referring to ENT  GrenadaBrittany J. Pollie MeyerMcIntyre, MD [facility]

## 2015-06-06 ENCOUNTER — Telehealth: Payer: Self-pay | Admitting: Family Medicine

## 2015-06-06 ENCOUNTER — Encounter: Payer: Self-pay | Admitting: Family Medicine

## 2015-06-06 DIAGNOSIS — J329 Chronic sinusitis, unspecified: Secondary | ICD-10-CM

## 2015-06-06 DIAGNOSIS — R079 Chest pain, unspecified: Secondary | ICD-10-CM | POA: Insufficient documentation

## 2015-06-06 HISTORY — DX: Chronic sinusitis, unspecified: J32.9

## 2015-06-06 HISTORY — DX: Chest pain, unspecified: R07.9

## 2015-06-06 LAB — COMPLETE METABOLIC PANEL WITHOUT GFR
ALT: 25 U/L (ref 6–29)
AST: 13 U/L (ref 10–30)
Albumin: 4 g/dL (ref 3.6–5.1)
Alkaline Phosphatase: 53 U/L (ref 33–115)
BUN: 11 mg/dL (ref 7–25)
CO2: 24 mmol/L (ref 20–31)
Calcium: 8.9 mg/dL (ref 8.6–10.2)
Chloride: 106 mmol/L (ref 98–110)
Creat: 0.64 mg/dL (ref 0.50–1.10)
GFR, Est African American: >89 mL/min
GFR, Est Non African American: >89 mL/min
Glucose, Bld: 90 mg/dL (ref 65–99)
Potassium: 3.7 mmol/L (ref 3.5–5.3)
Sodium: 141 mmol/L (ref 135–146)
Total Bilirubin: 0.5 mg/dL (ref 0.2–1.2)
Total Protein: 6.6 g/dL (ref 6.1–8.1)

## 2015-06-06 LAB — HEPATITIS PANEL, ACUTE
HCV Ab: NEGATIVE
Hep A IgM: NONREACTIVE
Hep B C IgM: NONREACTIVE
Hepatitis B Surface Ag: NEGATIVE

## 2015-06-06 LAB — CERVICOVAGINAL ANCILLARY ONLY
Chlamydia: NEGATIVE
Neisseria Gonorrhea: NEGATIVE
Trichomonas: NEGATIVE

## 2015-06-06 LAB — SYPHILIS: RPR W/REFLEX TO RPR TITER AND TREPONEMAL ANTIBODIES, TRADITIONAL SCREENING AND DIAGNOSIS ALGORITHM

## 2015-06-06 LAB — PATHOLOGIST SMEAR REVIEW

## 2015-06-06 LAB — TSH: TSH: 1.82 m[IU]/L

## 2015-06-06 LAB — HIV ANTIBODY (ROUTINE TESTING W REFLEX): HIV 1&2 Ab, 4th Generation: NONREACTIVE

## 2015-06-06 NOTE — Telephone Encounter (Signed)
Attempted to reach patient to let her know that all labwork and CXR were normal This is great news. Did not leave VM.  Will send letter stating all labs were normal.  Latrelle DodrillBrittany J Lajoyce Tamura, MD

## 2015-06-06 NOTE — Assessment & Plan Note (Signed)
Check wet prep for vaginal discharge STD screening: will check gc/chlamydia/trichomonas, HIV, RPR, acute hepatitis panel today

## 2015-06-06 NOTE — Assessment & Plan Note (Addendum)
Sick  With URI symptoms for at least 2 months. Well appearing today on the whole. Warrants laboratory evaluation and ENT referral for anatomical evaluation. Plan: - labs: HIV antibody, TSH, CBC diff, peripheral smear, CMET - CXR - referral to ENT

## 2015-06-06 NOTE — Assessment & Plan Note (Signed)
With positive family history, history of hypertension, cigarette smoking, and typical symptoms of pressure in chest brought on by exertion and relieved by rest, warrants cardiology evaluation. EKG normal today. Will refer to cardiology for further evaluation, anticipate need for stress test. Counseled on reasons to seek emergency care.

## 2015-06-25 ENCOUNTER — Telehealth: Payer: Self-pay | Admitting: *Deleted

## 2015-06-25 NOTE — Telephone Encounter (Signed)
Patient is calling in regards to her ENT referral.  States that she did receive a call from the ENT office's nurse stating that she would need to be seen here again and a new referral needed to be placed for May.  Patient is unsure of that this means and states that she will call her medicaid caseworker to ensure that it is still active.  Also I will send this message to referral coordinator to see if there was a fax or call received regarding this issue. Kalandra Masters,CMA

## 2015-06-25 NOTE — Telephone Encounter (Signed)
Attempted to contact Dr. Allayne Stackrossley's office to clarify this issue. They closed at 4PM . Will call back when the open at 9AM.

## 2015-06-25 NOTE — Telephone Encounter (Signed)
Patient called back and states that she her voicemail from the ENT office stated that her medicaid would be inactive after 06-30-15.  Advised patient to contact her caseworker and then check on this status.  Also once it is straightened out she can call the ENT office and let them know so she can get an appt.  Spoke with Tia regarding this concern and she states that when she looked on [place] patient is active in may but was unable to reach ENT office about this.  She will continue to try and reach them to see what is going on. [facility] Center[facility] Yarden Manuelito,CMA

## 2015-06-25 NOTE — Telephone Encounter (Signed)
Dr. Haroldine Lawsrossley office has attempted to call daily since 06/10/15 and have not received a call back.  I called pt and she answered call. Advised that Dr. Barrett Shellrossleys office has been calling her, she is aware but has "been really busy".  She will call them today to schedule an appointment. Gail Bass, Maryjo RochesterJessica Dawn, CMA

## 2015-06-26 NOTE — Telephone Encounter (Signed)
Called and spoke to [place]Kara at Dr. Allayne Stackrossley's office. She tells me that per [facility], pt's Medicaid is only active until 06/30/15. I advised her that [place] Tracks only gives you the current month's eligibility and past months. She states she will re-check pt's eligibility on 07/01/15 and if active, she will contact patient to schedule her appt.

## 2015-07-03 ENCOUNTER — Ambulatory Visit (INDEPENDENT_AMBULATORY_CARE_PROVIDER_SITE_OTHER): Payer: Medicaid Other | Admitting: Cardiology

## 2015-07-03 ENCOUNTER — Encounter: Payer: Self-pay | Admitting: Cardiology

## 2015-07-03 VITALS — BP 130/96 | HR 60 | Ht 62.0 in | Wt 122.1 lb

## 2015-07-03 DIAGNOSIS — R0789 Other chest pain: Secondary | ICD-10-CM | POA: Diagnosis not present

## 2015-07-03 DIAGNOSIS — Z720 Tobacco use: Secondary | ICD-10-CM

## 2015-07-03 NOTE — Patient Instructions (Signed)
Medication Instructions:  Your physician recommends that you continue on your current medications as directed. Please refer to the Current Medication list given to you today.   Labwork: NONE  Testing/Procedures: Your physician has requested that you have an exercise tolerance test. For further information please visit [email or web address] Please also follow instruction sheet, as given.   Follow-Up: Your physician wants you to follow-up in: AS NEEDED WITH DR. Anne FuSKAINS AT THIS TIME.  You will receive a reminder letter in the mail two months in advance. If you don't receive a letter, please call our office to schedule the follow-up appointment.   Any Other Special Instructions Will Be Listed Below (If Applicable).     If you need a refill on your cardiac medications before your next appointment, please call your pharmacy.

## 2015-07-03 NOTE — Progress Notes (Signed)
Cardiology Office Note    Date:  07/03/2015   ID:  Gail Bass, DOB 03/06/1975, MRN [id number]  PCP:  Uvaldo RisingFLETKE, KYLE, J, MD  Cardiologist:   Donato SchultzSKAINS, MARK, MD     History of Present Illness:  Gail Bass is a 40 y.o. female here for evaluation of chest pain. In review of office visit from 06/05/15 by Dr. Pollie MeyerMcIntyre, she was feeling chest pressure during exercise, worse with exertion resolving rest and associated shortness of breath. Father and sister died in 2013 3 months apart, both had pacemaker. Father was passed out in bathroom and died. Sister may have had CHF, waiting for VAD possibly. Her sister had a pacemaker at age 38 and died she states. She has a strong family history of myocardial infarction.  She has a history of smoking, asthma and depression. She hasn't experiencing upper respiratory symptoms for the past 2 months, cough. She continues to smoke. Since URI has improved symptoms have improved.   Works at [employer] out, going and going. Sometimes sharp pain in center of chest. If deep breath worse. Sweating at night. TSH and other blood work were normal.  Her daughter had a severe illness where she lost 75 pounds, had multiple surgeries, [facility]. Very sick. She has been under a lot of stress.  She did mention that once during sexual activity she felt faint as if she was going to pass out. She has had episodes in the past of what sounds like vasovagal-like episodes.     Past Medical History  Diagnosis Date  . TUBAL PREGNANCY 09/13/2008    Qualifier: History of  By: Lanier PrudeBolden  MD, Cathrine Musteraineisha    . Bacterial vaginosis   . Asthma   . Kidney stone     Past Surgical History  Procedure Laterality Date  . Cystectomy    . Tailbone cyst    . Cystoscopy with retrograde pyelogram, ureteroscopy and stent placement Left 02/22/2014    Procedure: CYSTOSCOPY WITH RETROGRADE PYELOGRAM, URETEROSCOPY, STONE BASKET EXTRACTION ;  Surgeon: Kathi LudwigSigmund I Tannenbaum, MD;  Location: [facility];   Service: Urology;  Laterality: Left;  . Tubal ligation      Current Medications: Outpatient Prescriptions Prior to Visit  Medication Sig Dispense Refill  . albuterol (PROVENTIL HFA;VENTOLIN HFA) 108 (90 Base) MCG/ACT inhaler Inhale 2 puffs into the lungs every 4 (four) hours as needed for wheezing or shortness of breath. 1 Inhaler 0  . cetirizine (ZYRTEC) 10 MG tablet Take 10 mg by mouth daily.    Marland Kitchen. ibuprofen (ADVIL,MOTRIN) 600 MG tablet Take 1 tablet (600 mg total) by mouth every 6 (six) hours as needed. 30 tablet 0  . ipratropium (ATROVENT) 0.06 % nasal spray Place 2 sprays into both nostrils 4 (four) times daily. (Patient taking differently: Place 2 sprays into both nostrils 3 (three) times daily. ) 15 mL 12  . pseudoephedrine (SUDAFED) 30 MG tablet Take 60 mg by mouth every 8 (eight) hours as needed for congestion.    . methocarbamol (ROBAXIN-750) 750 MG tablet Take 1 tablet (750 mg total) by mouth 4 (four) times daily. 30 tablet 0  . predniSONE (DELTASONE) 20 MG tablet 3 Tabs PO Days 1-3, then 2 tabs PO Days 4-6, then 1 tab PO Day 7-9, then Half Tab PO Day 10-12 (Patient not taking: Reported on 05/31/2015) 20 tablet 0   No facility-administered medications prior to visit.     Allergies:   Doxycycline and Lorcet   Social History   Social History  .  Marital Status: Single    Spouse Name: N/A  . Number of Children: N/A  . Years of Education: N/A   Social History Main Topics  . Smoking status: Current Some Day Smoker    Start date: 11/19/2012  . Smokeless tobacco: Never Used  . Alcohol Use: No  . Drug Use: Yes    Special: Marijuana  . Sexual Activity:    Partners: Male    Birth Control/ Protection: Condom   Other Topics Concern  . None   Social History Narrative   Has 3 children and one adopted daughter.   Works.    [employer].   Mother lives in [place]. They are estranged because her mother practices withcraft.      Family History:  The patient's family history includes  Arrhythmia in her father and sister; CAD in her other; Diabetes in her other; Heart failure in her maternal grandmother; Hypertension in her maternal aunt and maternal grandfather.   ROS:   Please see the history of present illness.    ROS All other systems reviewed and are negative.   PHYSICAL EXAM:   VS:  BP 130/96 mmHg  Pulse 60  Ht 5\' 2"  (1.575 m)  Wt 122 lb 1.9 oz (55.393 kg)  BMI 22.33 kg/m2  LMP 06/30/2015   GEN: Well nourished, well developed, in no acute distress HEENT: normal Neck: no JVD, carotid bruits, or masses Cardiac: RRR; no murmurs, rubs, or gallops,no edema  Respiratory:  clear to auscultation bilaterally, normal work of breathing GI: soft, nontender, nondistended, + BS MS: no deformity or atrophy Skin: warm and dry, no rash Neuro:  Alert and Oriented x 3, Strength and sensation are intact Psych: euthymic mood, full affect  Wt Readings from Last 3 Encounters:  07/03/15 122 lb 1.9 oz (55.393 kg)  06/05/15 123 lb 3.2 oz (55.883 kg)  05/31/15 117 lb (53.071 kg)      Studies/Labs Reviewed:   EKG:  EKG is ordered today.  07/03/15 shows normal rhythm, 60, no other abnormalities. EKG from 06/05/15 shows sinus bradycardia rate 54 with no other abnormalities. Personally viewed.  Recent Labs: 06/05/2015: ALT 25; BUN 11; Creat 0.64; Hemoglobin 12.8; Platelets 212; Potassium 3.7; Sodium 141; TSH 1.82   Lipid Panel No results found for: CHOL, TRIG, HDL, CHOLHDL, VLDL, LDLCALC, LDLDIRECT  Additional studies/ records that were reviewed today include:  Chest x-ray personally reviewed shows no acute changes.    ASSESSMENT:    1. Atypical chest pain   2. Tobacco use      PLAN:  In order of problems listed above:  Chest pain -Given her family history of coronary artery disease, smoking and symptoms we will proceed with exercise stress test to exclude any high risk ischemia. Perhaps some of her chest discomfort is associated with her recent upper respiratory  infections, perhaps reactive airways as well. Likely musculoskeletal type discomfort.  Tobacco use -Encouraged cessation -She used to be on Chantix but after taking it for a while she decided to stop especially after reading potential side effects.  Stress/anxiety -Her daughter has been through quite a bit recently, lost significant amount of weight, multiple surgeries. She is continuing to work with her primary physician regarding this. She is also stressed because she may lose her Medicaid card because she is over the income threshold.      Medication Adjustments/Labs and Tests Ordered: Current medicines are reviewed at length with the patient today.  Concerns regarding medicines are outlined above.  Medication changes, Labs  and Tests ordered today are listed in the Patient Instructions below. Patient Instructions  Medication Instructions:  Your physician recommends that you continue on your current medications as directed. Please refer to the Current Medication list given to you today.   Labwork: NONE  Testing/Procedures: Your physician has requested that you have an exercise tolerance test. For further information please visit [email or web address] Please also follow instruction sheet, as given.   Follow-Up: Your physician wants you to follow-up in: AS NEEDED WITH DR. Anne Fu AT THIS TIME.  You will receive a reminder letter in the mail two months in advance. If you don't receive a letter, please call our office to schedule the follow-up appointment.   Any Other Special Instructions Will Be Listed Below (If Applicable).     If you need a refill on your cardiac medications before your next appointment, please call your pharmacy.       Mathews Robinsons, MD  07/03/2015 9:29 AM    [facility] [address] [place], [place], [place]  [address] Phone: [phone]; Fax: [phone]

## 2015-07-17 ENCOUNTER — Ambulatory Visit (INDEPENDENT_AMBULATORY_CARE_PROVIDER_SITE_OTHER): Payer: Medicaid Other

## 2015-07-17 DIAGNOSIS — R0789 Other chest pain: Secondary | ICD-10-CM | POA: Diagnosis not present

## 2015-07-17 LAB — EXERCISE TOLERANCE TEST
CHL CUP STRESS STAGE 1 DBP: 85 mmHg
CHL CUP STRESS STAGE 1 GRADE: 0 %
CHL CUP STRESS STAGE 1 SBP: 126 mmHg
CHL CUP STRESS STAGE 1 SPEED: 0 mph
CHL CUP STRESS STAGE 10 DBP: 91 mmHg
CHL CUP STRESS STAGE 10 GRADE: 0 %
CHL CUP STRESS STAGE 10 HR: 93 {beats}/min
CHL CUP STRESS STAGE 10 SBP: 166 mmHg
CHL CUP STRESS STAGE 10 SPEED: 0 mph
CHL CUP STRESS STAGE 2 HR: 74 {beats}/min
CHL CUP STRESS STAGE 4 HR: 101 {beats}/min
CHL CUP STRESS STAGE 4 SBP: 148 mmHg
CHL CUP STRESS STAGE 4 SPEED: 1.7 mph
CHL CUP STRESS STAGE 5 DBP: 72 mmHg
CHL CUP STRESS STAGE 5 GRADE: 12 %
CHL CUP STRESS STAGE 5 HR: 113 {beats}/min
CHL CUP STRESS STAGE 5 SBP: 156 mmHg
CHL CUP STRESS STAGE 6 DBP: 81 mmHg
CHL CUP STRESS STAGE 6 HR: 127 {beats}/min
CHL CUP STRESS STAGE 6 SBP: 192 mmHg
CHL CUP STRESS STAGE 7 DBP: 85 mmHg
CHL CUP STRESS STAGE 7 GRADE: 16 %
CHL CUP STRESS STAGE 7 HR: 157 {beats}/min
CHL CUP STRESS STAGE 8 HR: 160 {beats}/min
CHL CUP STRESS STAGE 8 SPEED: 4.3 mph
CHL CUP STRESS STAGE 9 GRADE: 0 %
CHL CUP STRESS STAGE 9 SPEED: 0 mph
CHL RATE OF PERCEIVED EXERTION: 17
Estimated workload: 13.4 METS
Exercise duration (min): 12 min
Exercise duration (sec): 0 s
MPHR: 182 {beats}/min
Peak HR: 160 {beats}/min
Percent HR: 87 %
Percent of predicted max HR: 87 %
Rest HR: 63 {beats}/min
Stage 1 HR: 81 {beats}/min
Stage 2 Grade: 0 %
Stage 2 Speed: 1 mph
Stage 3 Grade: 0 %
Stage 3 HR: 73 {beats}/min
Stage 3 Speed: 1 mph
Stage 4 DBP: 70 mmHg
Stage 4 Grade: 10 %
Stage 5 Speed: 2.5 mph
Stage 6 Grade: 14 %
Stage 6 Speed: 3.4 mph
Stage 7 SBP: 190 mmHg
Stage 7 Speed: 4.2 mph
Stage 8 Grade: 16 %
Stage 9 DBP: 104 mmHg
Stage 9 HR: 134 {beats}/min
Stage 9 SBP: 148 mmHg

## 2015-08-07 ENCOUNTER — Other Ambulatory Visit (HOSPITAL_COMMUNITY)
Admission: RE | Admit: 2015-08-07 | Discharge: 2015-08-07 | Disposition: A | Discharge status: Home / Self Care | Payer: Medicaid Other | Source: Ambulatory Visit | Attending: Family Medicine | Admitting: Family Medicine

## 2015-08-07 ENCOUNTER — Ambulatory Visit (INDEPENDENT_AMBULATORY_CARE_PROVIDER_SITE_OTHER): Payer: Medicaid Other | Admitting: Family Medicine

## 2015-08-07 VITALS — Temp 98.2°F | Wt 121.0 lb

## 2015-08-07 DIAGNOSIS — Z720 Tobacco use: Secondary | ICD-10-CM

## 2015-08-07 DIAGNOSIS — N898 Other specified noninflammatory disorders of vagina: Secondary | ICD-10-CM | POA: Diagnosis present

## 2015-08-07 DIAGNOSIS — Z7251 High risk heterosexual behavior: Secondary | ICD-10-CM

## 2015-08-07 DIAGNOSIS — N760 Acute vaginitis: Secondary | ICD-10-CM | POA: Diagnosis not present

## 2015-08-07 DIAGNOSIS — A499 Bacterial infection, unspecified: Secondary | ICD-10-CM

## 2015-08-07 DIAGNOSIS — B9689 Other specified bacterial agents as the cause of diseases classified elsewhere: Secondary | ICD-10-CM

## 2015-08-07 DIAGNOSIS — Z113 Encounter for screening for infections with a predominantly sexual mode of transmission: Secondary | ICD-10-CM | POA: Insufficient documentation

## 2015-08-07 DIAGNOSIS — K648 Other hemorrhoids: Secondary | ICD-10-CM

## 2015-08-07 DIAGNOSIS — K644 Residual hemorrhoidal skin tags: Secondary | ICD-10-CM

## 2015-08-07 DIAGNOSIS — F17200 Nicotine dependence, unspecified, uncomplicated: Secondary | ICD-10-CM

## 2015-08-07 LAB — POCT URINE PREGNANCY: Preg Test, Ur: NEGATIVE

## 2015-08-07 LAB — POCT URINALYSIS DIPSTICK
BILIRUBIN UA: NEGATIVE
Glucose, UA: NEGATIVE
KETONES UA: NEGATIVE
NITRITE UA: NEGATIVE
PH UA: 6.5
Protein, UA: NEGATIVE
Spec Grav, UA: 1.02
Urobilinogen, UA: 0.2

## 2015-08-07 LAB — POCT WET PREP (WET MOUNT): CLUE CELLS WET PREP WHIFF POC: POSITIVE

## 2015-08-07 LAB — POCT UA - MICROSCOPIC ONLY

## 2015-08-07 MED ORDER — HYDROCORTISONE 1 % EX LOTN: 1.0000 "application " | TOPICAL_LOTION | Freq: Two times a day (BID) | CUTANEOUS | Status: DC

## 2015-08-07 MED ORDER — HYDROCORTISONE 1 % RE CREA: 1.0000 "application " | TOPICAL_CREAM | Freq: Two times a day (BID) | RECTAL | Status: DC

## 2015-08-07 MED ORDER — CEFTRIAXONE SODIUM 250 MG IJ SOLR
250.0000 mg | Freq: Once | INTRAMUSCULAR | Status: AC
  Administered 2015-08-07: 250 mg via INTRAMUSCULAR

## 2015-08-07 MED ORDER — NICOTINE 7 MG/24HR TD PT24: 7.0000 mg | MEDICATED_PATCH | Freq: Every day | TRANSDERMAL | Status: DC

## 2015-08-07 MED ORDER — NICOTINE POLACRILEX 4 MG MT LOZG: 4.0000 mg | LOZENGE | OROMUCOSAL | Status: DC | PRN

## 2015-08-07 MED ORDER — METRONIDAZOLE 0.75 % VA GEL: 1.0000 | Freq: Every day | VAGINAL | Status: DC

## 2015-08-07 MED ORDER — AZITHROMYCIN 500 MG PO TABS: 1000.0000 mg | ORAL_TABLET | ORAL | Status: DC

## 2015-08-07 MED ORDER — METRONIDAZOLE 1.3 % VA GEL: 1.0000 | Freq: Every day | VAGINAL | Status: DC

## 2015-08-07 NOTE — Progress Notes (Signed)
HPI  CC: VAGINAL DISCHARGE Discharge started ~1 month ago. Had unprotected sex ~2 weeks ago. Has been on abx (augmenting and ceftin). Also took diflucan at the same time. Last dose of both was ~1 month ago. Cramping started ~4 days ago. Similar to a cramps from her period. LMP 1 week ago.   Having vaginal discharge for ~51month. Discharge is: yellow/white Sex in last month: yes Using barrier protection (condoms): yes, but one time did not. Possible STD exposure: yes Personal history of vaginal infection: yes; G/C, BV Family history of uterine or vaginal cancer: no Recent antibiotic use: yes  Symptoms Vaginal itching: yes Dysuria:yes Dyspareunia: no Genital sores or ulcers: no Hematuria: no Flank pain: no Weight loss: no Weight gain: no Trouble with vision: no Headaches: occasional Abdomen or pelvic pain: some Back pain: no   ROS see HPI Smoking Status noted   Objective: Temp(Src) 98.2 F (36.8 C) (Oral)  Wt 121 lb (54.885 kg)  LMP 07/31/2015 (Approximate) Gen: NAD, alert, cooperative, and pleasant. CV: RRR, no murmur Resp: CTAB, no wheezes, non-labored Abd: SNTND, BS present, no guarding or organomegaly. No CVA tenderness. Female genitalia: Vulva: normal appearing vulva with no masses, tenderness or lesions Vagina: Normal-appearing tissue without lesions. Yellowish discharge noted in vaginal vault. No significant tenderness. Cervix: cervical discharge present - milky and thin and cervical motion tenderness present Uterus: uterus is normal size, shape, consistency and nontender Adnexa: normal adnexa in size, nontender and no masses Rectal: hemorrhoids: external non-thrombosed and no visible warts   Assessment and plan:  Vaginal discharge Patient is here with complaints of vaginal discharge 1 month. She states that she has had this issue in the past and felt that this may go away but it has persisted. Physical exam yielded significant cervical motion tenderness  suggestive of PID. Labs were obtained including a wet prep which showed significant clue cells and positive whiff, suggestive of BV. Due to the patient's risky sexual behavior (unprotected sex) will treat relatively aggressively. - PID: Azithromycin 1 g weekly 2 doses, ceftriaxone 250 mg IM once. (Patient has doxycycline allergy). - BV: Vaginal metronidazole 1 application daily 5 days.  External hemorrhoids External hemorrhoid noted on pelvic exam. Patient states that she has had some mild to moderate pain and would like help with treatment. - Rectal hydrocortisone gel twice a day. - Recommended increasing oral fiber and water intake.  Smoker Patient stated she is interested in smoking cessation. She was asking about prescription for Chantix. I informed her that this would best be suited to be discussed with her PCP but would be willing to offer nicotine replacement at this time. - Nicotine patch daily - Nicotine lozenge when necessary for cravings    Orders Placed This Encounter  Procedures  . HIV antibody (with reflex)  . Hepatitis panel, acute  . POCT urinalysis dipstick  . POCT UA - Microscopic Only  . POCT urine pregnancy  . POCT Wet Prep [facility])    Meds ordered this encounter  Medications  . nicotine (NICODERM CQ - DOSED IN MG/24 HR) 7 mg/24hr patch    Sig: Place 1 patch (7 mg total) onto the skin daily.    Dispense:  28 patch    Refill:  1  . nicotine polacrilex (COMMIT) 4 MG lozenge    Sig: Take 1 lozenge (4 mg total) by mouth as needed for smoking cessation.    Dispense:  100 tablet    Refill:  0  . azithromycin (ZITHROMAX) 500 MG  tablet    Sig: Take 2 tablets (1,000 mg total) by mouth once a week.    Dispense:  4 tablet    Refill:  0  . DISCONTD: MetroNIDAZOLE 1.3 % GEL    Sig: Place 1 applicator vaginally daily. For 5 days.    Dispense:  1 Tube    Refill:  0  . cefTRIAXone (ROCEPHIN) injection 250 mg    Sig:   . metroNIDAZOLE (METROGEL) 0.75 % vaginal  gel    Sig: Place 1 Applicatorful vaginally at bedtime. For 5 days.    Dispense:  70 g    Refill:  0  . DISCONTD: hydrocortisone 1 % lotion    Sig: Apply 1 application topically 2 (two) times daily.    Dispense:  118 mL    Refill:  0  . hydrocortisone (PROCTOCORT) 1 % CREA    Sig: Apply 1 application topically 2 (two) times daily.    Dispense:  1 Tube    Refill:  0     Kathee DeltonIan D Robynn Marcel, MD,MS,  PGY2 08/07/2015 6:38 PM

## 2015-08-07 NOTE — Assessment & Plan Note (Signed)
Patient is here with complaints of vaginal discharge 1 month. She states that she has had this issue in the past and felt that this may go away but it has persisted. Physical exam yielded significant cervical motion tenderness suggestive of PID. Labs were obtained including a wet prep which showed significant clue cells and positive whiff, suggestive of BV. Due to the patient's risky sexual behavior (unprotected sex) will treat relatively aggressively. - PID: Azithromycin 1 g weekly 2 doses, ceftriaxone 250 mg IM once. (Patient has doxycycline allergy). - BV: Vaginal metronidazole 1 application daily 5 days.

## 2015-08-07 NOTE — Patient Instructions (Signed)

## 2015-08-07 NOTE — Assessment & Plan Note (Addendum)
External hemorrhoid noted on pelvic exam. Patient states that she has had some mild to moderate pain and would like help with treatment. - Rectal hydrocortisone gel twice a day. - Recommended increasing oral fiber and water intake.

## 2015-08-07 NOTE — Assessment & Plan Note (Signed)
Patient stated she is interested in smoking cessation. She was asking about prescription for Chantix. I informed her that this would best be suited to be discussed with her PCP but would be willing to offer nicotine replacement at this time. - Nicotine patch daily - Nicotine lozenge when necessary for cravings

## 2015-08-08 LAB — CERVICOVAGINAL ANCILLARY ONLY
Chlamydia: NEGATIVE
Neisseria Gonorrhea: NEGATIVE

## 2015-08-08 LAB — HEPATITIS PANEL, ACUTE
HCV AB: NEGATIVE
HEP A IGM: NONREACTIVE
HEP B C IGM: NONREACTIVE
HEP B S AG: NEGATIVE

## 2015-08-08 LAB — HIV ANTIBODY (ROUTINE TESTING W REFLEX): HIV 1&2 Ab, 4th Generation: NONREACTIVE

## 2015-08-09 ENCOUNTER — Encounter: Payer: Self-pay | Admitting: Family Medicine

## 2015-08-29 ENCOUNTER — Encounter: Payer: Self-pay | Admitting: Internal Medicine

## 2015-08-29 ENCOUNTER — Ambulatory Visit (INDEPENDENT_AMBULATORY_CARE_PROVIDER_SITE_OTHER): Payer: Medicaid Other | Admitting: Internal Medicine

## 2015-08-29 ENCOUNTER — Other Ambulatory Visit: Payer: Self-pay | Admitting: Internal Medicine

## 2015-08-29 VITALS — BP 153/94 | HR 67 | Temp 98.2°F | Ht 62.0 in | Wt 124.2 lb

## 2015-08-29 DIAGNOSIS — L0103 Bullous impetigo: Secondary | ICD-10-CM | POA: Diagnosis not present

## 2015-08-29 MED ORDER — ALBUTEROL SULFATE HFA 108 (90 BASE) MCG/ACT IN AERS: 2.0000 | INHALATION_SPRAY | RESPIRATORY_TRACT | Status: DC | PRN

## 2015-08-29 MED ORDER — MUPIROCIN CALCIUM 2 % EX CREA: 1.0000 "application " | TOPICAL_CREAM | Freq: Two times a day (BID) | CUTANEOUS | Status: DC

## 2015-08-29 MED ORDER — MUPIROCIN 2 % EX OINT: TOPICAL_OINTMENT | CUTANEOUS | Status: DC

## 2015-08-29 NOTE — Patient Instructions (Signed)
It was nice meeting you today Gail Bass.   Please start putting the antibiotic cream (Bactroban) on the bumps twice a day. If more bumps continue to develop or you do not see an improvement in a few days, please call our office to schedule another appointment.   If you have any questions or concerns, please feel free to call our office.   Be well,  Dr. Natale MilchLancaster

## 2015-08-29 NOTE — Assessment & Plan Note (Signed)
Pruritic vesicular lesions around mouth most consistent with early stages of bullous impetigo, especially given recent contact with nephew and daughter. Herpes less likely as patient with no burning or pain at site of the lesions, and that the lesions are present outside the mouth.  - Bactroban 2% cream BID

## 2015-08-29 NOTE — Progress Notes (Signed)
   Subjective:    Patient ID: Gail Bass, female    DOB: 01/20/1976, 40 y.o.   MRN: [id number]  HPI  Patient presents for same day appointment with pruritic bumps on face.   Patient reports awaking this morning to find two small bumps present on the border of her lower lip. Since then, two more bumps have formed. She describes the lesions as intensely pruritic, but not painful. She has had fever blisters in the past, and says this feels very different from that. She reports rhinorrhea for the past 3 days, but denies sore throat, fever, or any other symptoms. She has not used any new facial products.  Her nephew was diagnosed with impetigo four days ago, and her daughter was seen by her pediatrician this morning for a similar rash.   Patient is a former smoker but has not smoked for the past three weeks.   Review of Systems See HPI.     Objective:   Physical Exam  Constitutional: She is oriented to person, place, and time. She appears well-developed and well-nourished. No distress.  HENT:  Head: Normocephalic and atraumatic.  Nose: Nose normal.  Mouth/Throat: Oropharynx is clear and moist. No oropharyngeal exudate.  Neurological: She is alert and oriented to person, place, and time.  Skin:     Psychiatric: She has a normal mood and affect. Her behavior is normal.      Assessment & Plan:  Impetigo bullosa Pruritic vesicular lesions around mouth most consistent with early stages of bullous impetigo, especially given recent contact with nephew and daughter. Herpes less likely as patient with no burning or pain at site of the lesions, and that the lesions are present outside the mouth.  - Bactroban 2% cream BID   Tarri AbernethyAbigail J Sherida Dobkins, MD PGY-1 [facility] Pager [phone]

## 2015-09-02 ENCOUNTER — Ambulatory Visit (INDEPENDENT_AMBULATORY_CARE_PROVIDER_SITE_OTHER): Payer: Medicaid Other | Admitting: Family Medicine

## 2015-09-02 DIAGNOSIS — L659 Nonscarring hair loss, unspecified: Secondary | ICD-10-CM | POA: Diagnosis present

## 2015-09-02 NOTE — Assessment & Plan Note (Addendum)
-   No signs of ringworm today on exam. Will not give medication at this time. - Would like second opinion. Referral to Dermatology placed. - Hair loss is chronic issue. Self-discontinued Spironolactone. Recommend following up with PCP.  * Per Nursing, patient became tearful and refused to leave until second opinion by our office was provided. Initially discussed with Dr. Gwendolyn GrantWalden, however patient had left before Dr. Gwendolyn GrantWalden could see her. Dr. Wende MottMcKeag reports she asked him to evaluate her as he was walking by. Dr. Wende MottMcKeag agreed with my assessment and plan and encouraged her to follow up with Dermatology. Recommended Selsun Blue for over the counter treatment.

## 2015-09-02 NOTE — Patient Instructions (Signed)
I did not see any signs of ringworm at this visit. You may continue to use Zyrtec or Benadryl for itching. I have placed a referral to Dermatology for a second opinion. Please follow up with your PCP.  Dr. Caroleen Hammanumley

## 2015-09-02 NOTE — Progress Notes (Signed)
Subjective:     Patient ID: Gail Bass, female   DOB: 01/03/1976, 40 y.o.   MRN: [id number]  HPI Mrs. Gail Bass is a 40yo female presenting for possible tinea capitis. - Reports 3-4 years ago she had something wrong with her hair. Hairdresser thought it was similar to lice, but physicians told her she didn't have lice. She self treated it with hair gel and baking soda.  - Has chronic bald spot at the base of her posterior head. Has been present for 4 years. Has had workup and was found to have slightly elevated testosterone and low estrogen. Was prescribed Spironolactone, which she self discontinued since she didn't like how it made her feel. - States a hair dresser refused to do her hair this weekend because she had ringworm. Does not have a specific rash or lesion, but states she still feels like she has a rash. - Head is diffusely itchy. Has tried Benadryl, Cetirizine, topical solution without relief. - Family members have had ringworm lately, including children and her children's dad.  Review of Systems Per HPI. Other systems negative.    Objective:   Physical Exam  Constitutional: She appears well-developed and well-nourished. No distress.  Cardiovascular: Normal rate and regular rhythm.  Exam reveals no gallop and no friction rub.   No murmur heard. Pulmonary/Chest: Effort normal. No respiratory distress. She has no wheezes.  Skin: No rash noted.  No lesions noted on scalp. No signs of lice. Impetigo noted on face, documented at last office visit.  Psychiatric:  Tearful      Assessment and Plan:     Hair loss - No signs of ringworm today on exam. Will not give medication at this time. - Would like second opinion. Referral to Dermatology placed. - Hair loss is chronic issue. Self-discontinued Spironolactone. Recommend following up with PCP.  * Per Nursing, patient became tearful and refused to leave until second opinion by our office was provided. Initially discussed with  Dr. Gwendolyn GrantWalden, however patient had left before Dr. Gwendolyn GrantWalden could see her. Dr. Wende MottMcKeag reports she asked him to evaluate her as he was walking by. Dr. Wende MottMcKeag agreed with my assessment and plan and encouraged her to follow up with Dermatology. Recommended Selsun Blue for over the counter treatment.

## 2015-10-21 ENCOUNTER — Other Ambulatory Visit: Payer: Self-pay | Admitting: Family Medicine

## 2015-10-21 ENCOUNTER — Ambulatory Visit: Payer: Medicaid Other | Attending: Internal Medicine

## 2015-10-21 DIAGNOSIS — L739 Follicular disorder, unspecified: Secondary | ICD-10-CM

## 2015-10-21 NOTE — Telephone Encounter (Signed)
Patient came to office stated she was approved for the orange card today. 100% discount. [facility] NorwayLaMar. Patient was told that she needs her pcp here to write the RX Clindamycin 150 mg that she needs to take for 3 months.  Patient will take to Health dept to get filled.  Please let patient know when she may pick this up.  [phone]

## 2015-10-22 NOTE — Telephone Encounter (Signed)
Spoke to patient. She was recently diagnosed with "folliculitis" by [facility]. Started on 4 month course of Clindamycine (has completed one month). Recently lost Medicaid but was approved for orange card. Needs to get prescriptions from Health Department. [facility]. I told patient that I am not able to provide prescription as I am not certain of her diagnosis. However, she should call Dr. Judithann SheenLemar and have her send in a refill to the Health Department. I would also like her to come to the office to complete a record release from Dr. Sonda RumbleLemar's office so that I can see the diagnosis that was made.   Note to Tia - does she need a new referral to Dr. Judithann SheenLemar if she lost Medicaid and now has orange card?

## 2015-10-22 NOTE — Telephone Encounter (Signed)
[facility] NOT participate with the orange card. Patient will either continue to be seen there as a self pay pt, PCP can write her RX for 3 remaining months or we can refer her to another derm that provides slots through orange card. This referral will have to go through [facility] and may takes few months for appt due to limit availability.

## 2015-10-30 NOTE — Telephone Encounter (Signed)
RN staff - I have attempted to call patient multiple times with no answer. Can you please try to call her again and inform her that her old dermatology clinic does NOT accept orange card. If she wishes to return there she will need to pay out of pocket. I can also refer her to another dermatology clinic but it may take a few months to get in.

## 2015-10-31 ENCOUNTER — Telehealth: Payer: Self-pay | Admitting: Family Medicine

## 2015-10-31 NOTE — Telephone Encounter (Signed)
Patient filled out authorization to disclose  form for [facility]. Patient is needing to get current medications through [employer]. Patient request call to let her know when complete & RX  Clindamycin 150 mg cap for folliculitis. Form placed in red folder at front desk.

## 2015-11-01 NOTE — Telephone Encounter (Signed)
Please inform that patient that I will need to see records before I can fill the medication. Please also schedule her to see me in office.

## 2015-11-01 NOTE — Telephone Encounter (Signed)
Pt informed. Pt states that she did call the dermatology office to see if they would refill her medicine and they told her since she has orange card her [place] dr has to prescribe her medicine. Please advise. Brezlyn Manrique Bruna PotterBlount, CMA

## 2015-11-01 NOTE — Telephone Encounter (Signed)
Pt returned Jessica's call. 

## 2015-11-01 NOTE — Telephone Encounter (Signed)
Release of Information faxed to [facility], will await records. FYI to PCP.

## 2015-11-05 NOTE — Telephone Encounter (Signed)
Called her @ work number, no answer.   If pt is calling about meds from Dermatology [facility], she will need an appt. Fleeger, Maryjo RochesterJessica Dawn, CMA

## 2015-11-05 NOTE — Telephone Encounter (Signed)
Pt called because she was check ing the status of her request for refills on her 2 medications she is taking. Please call her at work after 2 pm since she works at [employer] and they are very busy and she can not have her cell phone at work. Gail Bass

## 2015-11-11 NOTE — Telephone Encounter (Signed)
Pt is calling back to check the status of her request medication. She has to ask off at least 3 weeks in advance. She scheduled a appointment for 12/05/15 at 8:30 am to see Dr. Randolm IdolFletke. She can only get her medication now from the MAP program because she has the orange and since Dr. Judithann SheenLeMar doesn't take the orange card he can not prescribe her the medication. She is asking for enough to last until her appointment on 12/05/15. She has enough for 13 days so she would only need medication from 11/25/15-12/05/15 10-11 days worth. She said that the medication is finally working and she has her eyebrows growing back and hair filling in. She hates to have a break and then have to start over again since it takes about 2 months for the medication to show signs of improvement. Please call her at [phone] after 2 PM at her job which is [employer]. jw

## 2015-11-11 NOTE — Telephone Encounter (Signed)
LMOVM for pt to call us back. Raymir Frommelt, CMA  

## 2015-11-11 NOTE — Telephone Encounter (Signed)
Nursing staff - please call patient back and inform her that I will be happy to refill the medication as soon as I see records from Dr. Sonda RumbleLemar's office, she needs to come to our office and complete a record request so that we can see some records, as soon as I have seen these and confirm her diagnosis then I can send in the medication.

## 2015-11-12 ENCOUNTER — Other Ambulatory Visit: Payer: Self-pay | Admitting: Family Medicine

## 2015-11-12 ENCOUNTER — Encounter: Payer: Self-pay | Admitting: Family Medicine

## 2015-11-12 DIAGNOSIS — L739 Follicular disorder, unspecified: Secondary | ICD-10-CM | POA: Insufficient documentation

## 2015-11-12 MED ORDER — CLINDAMYCIN HCL 150 MG PO CAPS: 150.0000 mg | ORAL_CAPSULE | Freq: Two times a day (BID) | ORAL | 0 refills | Status: DC

## 2015-11-12 NOTE — Telephone Encounter (Signed)
The medicine is clyndamicine (sp ?) 150 mg capsules taken twice a day.  She has the orange card.  It will need to got the health dept pharmacy. Pt says dr Randolm Idolfletke told her to give this information to a nurse and he would fill the prescription

## 2015-11-12 NOTE — Telephone Encounter (Signed)
[facility] and has [facility]ills left. It needs to changed to Health Dept

## 2015-11-12 NOTE — Telephone Encounter (Signed)
RN staff - please call [facility] and confirm that they received Clindamycin 150 mg BID, dispense 60, no refill. If they have the script please call the patient and let her know that it is available.

## 2015-11-12 NOTE — Telephone Encounter (Signed)
[facility](Sevana Lemar PA-C), punch biopsy performed 7/12 showed ruptured suppurative folliculitis of the scalp. No treatment plan outlined in documentation.   I have called the patient. Left message that I need to confirm the following information before I can call in the Clindamycin.  1. Dose of Clindamycin 2. Frequency of Clindamycin 3. Pharmacy that she wishes for me to send it to  Dr. Randolm IdolFletke

## 2015-11-15 NOTE — Telephone Encounter (Signed)
Called the [facility] Pharmacy, they confirmed that the medication is waiting for patient pick up.  Patient need to bring in her new orange card.  Clovis PuMartin, Kalliopi Coupland L, RN
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
## 2015-12-05 ENCOUNTER — Ambulatory Visit (INDEPENDENT_AMBULATORY_CARE_PROVIDER_SITE_OTHER): Payer: Self-pay | Admitting: Family Medicine

## 2015-12-05 ENCOUNTER — Encounter: Payer: Self-pay | Admitting: Family Medicine

## 2015-12-05 ENCOUNTER — Other Ambulatory Visit: Payer: Self-pay | Admitting: Family Medicine

## 2015-12-05 DIAGNOSIS — L739 Follicular disorder, unspecified: Secondary | ICD-10-CM

## 2015-12-05 DIAGNOSIS — B001 Herpesviral vesicular dermatitis: Secondary | ICD-10-CM | POA: Insufficient documentation

## 2015-12-05 DIAGNOSIS — I10 Essential (primary) hypertension: Secondary | ICD-10-CM

## 2015-12-05 DIAGNOSIS — Z113 Encounter for screening for infections with a predominantly sexual mode of transmission: Secondary | ICD-10-CM | POA: Insufficient documentation

## 2015-12-05 DIAGNOSIS — Z0000 Encounter for general adult medical examination without abnormal findings: Secondary | ICD-10-CM

## 2015-12-05 DIAGNOSIS — R5383 Other fatigue: Secondary | ICD-10-CM

## 2015-12-05 DIAGNOSIS — L659 Nonscarring hair loss, unspecified: Secondary | ICD-10-CM

## 2015-12-05 DIAGNOSIS — F4310 Post-traumatic stress disorder, unspecified: Secondary | ICD-10-CM

## 2015-12-05 DIAGNOSIS — Z87891 Personal history of nicotine dependence: Secondary | ICD-10-CM

## 2015-12-05 HISTORY — DX: Herpesviral vesicular dermatitis: B00.1

## 2015-12-05 LAB — FOLATE: Folate: 18.8 ng/mL (ref >5.4)

## 2015-12-05 LAB — VITAMIN B12: Vitamin B-12: 374 pg/mL (ref 200–1100)

## 2015-12-05 MED ORDER — IPRATROPIUM BROMIDE 0.06 % NA SOLN: 2.0000 | Freq: Four times a day (QID) | NASAL | 12 refills | Status: DC

## 2015-12-05 MED ORDER — CLINDAMYCIN HCL 150 MG PO CAPS: 150.0000 mg | ORAL_CAPSULE | Freq: Two times a day (BID) | ORAL | 1 refills | Status: DC

## 2015-12-05 MED ORDER — CETIRIZINE HCL 10 MG PO TABS: 10.0000 mg | ORAL_TABLET | Freq: Every day | ORAL | 1 refills | Status: DC

## 2015-12-05 MED ORDER — LYSINE 1000 MG PO TABS: 1.0000 | ORAL_TABLET | Freq: Every day | ORAL | 0 refills | Status: DC

## 2015-12-05 NOTE — Assessment & Plan Note (Signed)
Patient mentioned during visit but did not fully discuss. Does not currently see a behavioral health specialist.  -will have patient back in 2-4 weeks to discuss mood/depression/PTSD

## 2015-12-05 NOTE — Assessment & Plan Note (Signed)
Blood pressure well controlled. Not on medications. Will remove from problem list.

## 2015-12-05 NOTE — Assessment & Plan Note (Signed)
Related to folliculitis of scalp. Resolving with treatment for folliculitis.  -refilled Clindamycin prescription

## 2015-12-05 NOTE — Assessment & Plan Note (Signed)
No currently symptoms. Patient treats with lysine supplementation. -follow clinically

## 2015-12-05 NOTE — Assessment & Plan Note (Signed)
Declined flu vaccine

## 2015-12-05 NOTE — Assessment & Plan Note (Signed)
Patient reports chronic fatigue. DDx. Includes anemia/vitamin deficiency/hormone abnormality/PTSD/Depression. Recent TSH normal. -will check hormone levels and basic labs today (Testosterone, Estrogen, Vitamin D, BMP, CBC)

## 2015-12-05 NOTE — Patient Instructions (Addendum)
It was nice to see you today.  Dr. Randolm IdolFletke will call you with your results.  Please refill your Clindamycin  Return in one month to follow up on fatigue and hair loss.

## 2015-12-05 NOTE — Assessment & Plan Note (Signed)
Quit in June 2017.

## 2015-12-06 LAB — VITAMIN D 25 HYDROXY (VIT D DEFICIENCY, FRACTURES): VIT D 25 HYDROXY: 31 ng/mL (ref 30–100)

## 2015-12-11 LAB — TESTOS,TOTAL,FREE AND SHBG (FEMALE)
Sex Hormone Binding Glob.: 43 nmol/L (ref 17–124)
TESTOSTERONE,TOTAL,LC/MS/MS: 27 ng/dL (ref 2–45)
Testosterone, Free: 3.2 pg/mL (ref 0.1–6.4)

## 2015-12-11 LAB — ESTROGENS, TOTAL: Estrogen: 293.7 pg/mL

## 2015-12-12 ENCOUNTER — Telehealth: Payer: Self-pay | Admitting: Family Medicine

## 2015-12-12 NOTE — Telephone Encounter (Signed)
Attempted to call patient with results. No answer.

## 2015-12-13 ENCOUNTER — Encounter: Payer: Self-pay | Admitting: Family Medicine

## 2015-12-13 NOTE — Telephone Encounter (Signed)
Attempted to contact patient with lab results. No answer. Will send letter.

## 2015-12-25 NOTE — Progress Notes (Signed)
   Subjective:    Patient ID: Gail Bass, female    DOB: 06/22/1975, 40 y.o.   MRN: [id number]  HPI 40 y/o female presents for follow up of fatigue.  Fatigue Evaluated at previous visit. Lab work unremarkable. Patient continues to have chronic fatigue, sleeps 6-8 hours per night, denies insomnia.   PTSD/Depressed Mood Not currently on pharmacotherapy. Does not follow with psychology. States that mood is down mostly due to hair loss  Social Has 3 daughters, oldest daughter is chronically ill, was in and out of the hospital over the past few years, daughter is feeling better now (had cyclical vomiting syndrome, also had gallbladder removed, had feeding tube placed). Patient has reconnected with her eldest daughter.    Hair loss Improved on Clindamycin however has 2 areas of hair loss (frontal and right occipital). Started biotin which has made hair more "shiny".   Review of Systems  Constitutional: Positive for fatigue. Negative for chills and fever.  Respiratory: Negative for cough and shortness of breath.   Gastrointestinal: Negative for diarrhea, nausea and vomiting.       Objective:   Physical Exam BP 139/73   Pulse 61   Temp 98.2 F (36.8 C) (Oral)   Ht 5\' 2"  (1.575 m)   Wt 129 lb (58.5 kg)   LMP 12/06/2015   BMI 23.59 kg/m   Gen: pleasant female, NAD Cardiac: RRR, S1 and S2 present, no murmur Resp:CTAB,normal effort Psych: well groomed, affect is outgoing, mood is stated as good, no tangential thought, no flight of ideas Skin: 2 small patches of hair loss (one over anterior scalp and other over occiput), no associated erythema/inflammation  Labs from last visit Hormone levels normal.  Folate/B12/Vit D wnl BMP and CBC not drawn  PHQ 9 score of 1 GAD 7 score of 0 MDQ score of 0     Assessment & Plan:  Hair loss Generally improved. Continue Clindamycin. -may need to consider otc Rogaine if patient continues to have hair loss  PTSD (post-traumatic  stress disorder) Symptoms currently stable. Will monitor clinically.  PHQ 9 score of 1 GAD 7 score of 0 MDQ score of 0  Fatigue Labs unremarkable for cause of fatigue. BMP and CBC today as not drawn at last visit. Suspect psychiatric component.

## 2015-12-27 ENCOUNTER — Ambulatory Visit (INDEPENDENT_AMBULATORY_CARE_PROVIDER_SITE_OTHER): Payer: Self-pay | Admitting: Family Medicine

## 2015-12-27 ENCOUNTER — Encounter: Payer: Self-pay | Admitting: Family Medicine

## 2015-12-27 DIAGNOSIS — R5383 Other fatigue: Secondary | ICD-10-CM

## 2015-12-27 DIAGNOSIS — L659 Nonscarring hair loss, unspecified: Secondary | ICD-10-CM

## 2015-12-27 DIAGNOSIS — F4310 Post-traumatic stress disorder, unspecified: Secondary | ICD-10-CM

## 2015-12-27 NOTE — Assessment & Plan Note (Signed)
Labs unremarkable for cause of fatigue. BMP and CBC today as not drawn at last visit. Suspect psychiatric component.

## 2015-12-27 NOTE — Patient Instructions (Signed)
It was nice to see you day.   Please follow up in 2-3 months to discuss hair loss.

## 2015-12-27 NOTE — Assessment & Plan Note (Signed)
Generally improved. Continue Clindamycin. -may need to consider otc Rogaine if patient continues to have hair loss

## 2015-12-27 NOTE — Assessment & Plan Note (Signed)
Symptoms currently stable. Will monitor clinically.  PHQ 9 score of 1 GAD 7 score of 0 MDQ score of 0

## 2015-12-28 LAB — CBC
HEMATOCRIT: 42.5 % (ref 35.0–45.0)
Hemoglobin: 14.7 g/dL (ref 11.7–15.5)
MCH: 32.7 pg (ref 27.0–33.0)
MCHC: 34.6 g/dL (ref 32.0–36.0)
MCV: 94.7 fL (ref 80.0–100.0)
MPV: 9.3 fL (ref 7.5–12.5)
PLATELETS: 208 10*3/uL (ref 140–400)
RBC: 4.49 MIL/uL (ref 3.80–5.10)
RDW: 12.7 % (ref 11.0–15.0)
WBC: 7.9 10*3/uL (ref 3.8–10.8)

## 2015-12-28 LAB — BASIC METABOLIC PANEL WITH GFR
BUN: 14 mg/dL (ref 7–25)
CALCIUM: 9.3 mg/dL (ref 8.6–10.2)
CO2: 26 mmol/L (ref 20–31)
CREATININE: 0.66 mg/dL (ref 0.50–1.10)
Chloride: 104 mmol/L (ref 98–110)
GFR, Est Non African American: >89 mL/min (ref >=60)
GLUCOSE: 91 mg/dL (ref 65–99)
POTASSIUM: 4.4 mmol/L (ref 3.5–5.3)
Sodium: 139 mmol/L (ref 135–146)

## 2015-12-30 ENCOUNTER — Encounter: Payer: Self-pay | Admitting: Family Medicine

## 2016-01-20 ENCOUNTER — Ambulatory Visit: Payer: Self-pay | Attending: Internal Medicine

## 2016-03-11 ENCOUNTER — Ambulatory Visit (INDEPENDENT_AMBULATORY_CARE_PROVIDER_SITE_OTHER): Payer: Self-pay | Admitting: Internal Medicine

## 2016-03-11 ENCOUNTER — Encounter: Payer: Self-pay | Admitting: Licensed Clinical Social Worker

## 2016-03-11 ENCOUNTER — Encounter: Payer: Self-pay | Admitting: Internal Medicine

## 2016-03-11 DIAGNOSIS — B001 Herpesviral vesicular dermatitis: Secondary | ICD-10-CM

## 2016-03-11 DIAGNOSIS — F4310 Post-traumatic stress disorder, unspecified: Secondary | ICD-10-CM

## 2016-03-11 MED ORDER — ACYCLOVIR 400 MG PO TABS: 400.0000 mg | ORAL_TABLET | Freq: Two times a day (BID) | ORAL | 2 refills | Status: DC

## 2016-03-11 NOTE — Progress Notes (Signed)
Warm handoff   SUBJECTIVE: Gail Bass is a 41 y.o. female  referred by Dr. Juleen Bass for:  anxiety and depression. Per Dr. Juleen Bass, patient is to follow up with [facility] due to her previous mental health history.  LCSW met with patient to assess for barriers that would prevent her from going to [name] .  Patient has been to therapy in the past, and is open to go to [name] today. No barriers identified.  LCSW utilized reflective listen and relaxation techniques with patient in the office.  Referral/ Interventioms: Counselor/Psychotherapist  At [name]  Follow up call to patient in the afternoon.  Patient indicated she was currently at [name] completing paper work. Asked LCSW if MD could provide a note due to missing work today. Dr. Juleen Bass informed.   Patient unable to return to office to pick up note due to assessment at [name]. Requested to have letter faxed.     PLAN: 1. MD will complete note for work 2. LCSW will fax note to number patient provided by patient once completed by MD (fax # [phone])  Gail Lanius, LCSW Licensed Clinical Social Worker [place]   [phone] 1:40 PM
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
## 2016-03-11 NOTE — Assessment & Plan Note (Signed)
Patient symptomatic at present with significant worsening of PHQ-9, GAD-7, and MDQ scores from previous office visit in Oct. Discussed with PCP Dr. Randolm IdolFletke and given patient's complicated mental health past, elected to hold off on pharmacotherapy and defer to psychiatry. Patient is currently safe and not endorsing suicidal ideations.  -seen [facility], please see her note -[facility] and encouraged patient to make an appointment  -f/u with PCP

## 2016-03-11 NOTE — Assessment & Plan Note (Signed)
Given frequent recurrence of symptoms, patient would likely benefit from suppressive therapy. Discussed with patient that stress can lead to recurrent outbreaks. Discussed that testing for HSV would not change management.  -Rx for Acyclovir 400 mg BID given  -if patient continues to have outbreaks despite suppressive therapy, she is to follow up with PCP

## 2016-03-11 NOTE — Patient Instructions (Addendum)
For your cold sores, take Acyclovir twice per day to suppress the herpes virus. This helps to prevent cold sores before they start.   Call [facility] at [phone] to get set up with a psychiatrist. If you ever have any thoughts of harming yourself or others please call the clinic right away.

## 2016-05-15 ENCOUNTER — Encounter: Payer: Self-pay | Admitting: Family Medicine

## 2016-05-15 ENCOUNTER — Ambulatory Visit (INDEPENDENT_AMBULATORY_CARE_PROVIDER_SITE_OTHER): Payer: Medicaid Other | Admitting: Family Medicine

## 2016-05-15 DIAGNOSIS — Z87891 Personal history of nicotine dependence: Secondary | ICD-10-CM | POA: Diagnosis not present

## 2016-05-15 DIAGNOSIS — B001 Herpesviral vesicular dermatitis: Secondary | ICD-10-CM | POA: Diagnosis present

## 2016-05-15 DIAGNOSIS — M79672 Pain in left foot: Secondary | ICD-10-CM | POA: Diagnosis not present

## 2016-05-15 DIAGNOSIS — F4310 Post-traumatic stress disorder, unspecified: Secondary | ICD-10-CM | POA: Diagnosis not present

## 2016-05-15 MED ORDER — ACYCLOVIR 400 MG PO TABS: 400.0000 mg | ORAL_TABLET | Freq: Two times a day (BID) | ORAL | 1 refills | Status: DC

## 2016-05-15 NOTE — Assessment & Plan Note (Signed)
No active lesions. Less outbreaks with suppression therapy. -continue daily acyclovir

## 2016-05-15 NOTE — Progress Notes (Signed)
   Subjective:    Patient ID: Gail Bass, female    DOB: 09/24/1975, 41 y.o.   MRN: [id number]  HPI 41 y/o female presents for routine follow up.  Chronic Fatigue/PTSD Recently lost job - was able to go back to school to get GED, states she is in a better mood as a result, has also been speaking in public about PTSD, give her an opportunity to help others, still having   Chronic cold sores Started on Acyclovir at last visit, has small lesion a few weeks ago and went away quickly, no other outbreaks  Left Ankle Walking out of class yesterday, stepped off curb and foot twisted inward, had some swelling, able to walk on right away (few minutes after injury). Took some Tylenol last night, wrapped with ace wrap (make pain worse due to swelling). Pain currently 8/10.   Tobacco use No smoking in one year.   HM Up to date   Review of Systems     Objective:   Physical Exam BP 138/70   Pulse 82   Temp 98.2 F (36.8 C) (Oral)   Wt 141 lb (64 kg)   SpO2 97%   BMI 25.79 kg/m    Gen: pleasant female, NAD Psych: well groomed, affect is outgoing, mood is happy, no tangential thoughts, no SI, no HI MSK: left ankle - tenderness over cuboid/3-4 proximal metatarsals, minimal swelling, no erythema, no tenderness over either malleolus, strength eversion/inversion 5/5, negative compression test for high ankle sprain   12/2015 BMP unremarkable CBC normal Normal estrogen and testosterone levels B12/folate/Vit D normal  TSH in 06/2015 in normal range  GAD 7 score of 2 (no difficulty) PHQ 9 score of 2 (no difficulty), no SI    Assessment & Plan:  Recurrent cold sores No active lesions. Less outbreaks with suppression therapy. -continue daily acyclovir  Former smoker Has not smoker for over a year. Congratulated her on her success.   DEPRESSION Well controlled. PHQ 9 score of 2. GAD 7 score of 2.  -no indication for pharmacotherapy at this time  PTSD (post-traumatic stress  disorder) Improved. Social situation has improved.  -continue to monitor at subsequent visit.  Left foot pain Left cuboid/3-4 proximal metatarsal tenderness after inversion injury. Patient declined xray at this time due to not having insurance. -conservative therapy with ice and otc nsaids/tylenol -return precautions given for need to get xray

## 2016-05-15 NOTE — Assessment & Plan Note (Addendum)
Left cuboid/3-4 proximal metatarsal tenderness after inversion injury. Patient declined xray at this time due to not having insurance. -conservative therapy with ice and otc nsaids/tylenol -return precautions given for need to get xray

## 2016-05-15 NOTE — Patient Instructions (Addendum)
It was nice to see you today.  Foot Pain - ice your foot 2-3 times per day, may take Ibuprofen or Tylenol as needed for pain, if your pain does not improve over the next 3-5 days please call my office for an xray  Continue Acyclovir twice daily.

## 2016-05-15 NOTE — Assessment & Plan Note (Signed)
Well controlled. PHQ 9 score of 2. GAD 7 score of 2.  -no indication for pharmacotherapy at this time

## 2016-05-15 NOTE — Assessment & Plan Note (Signed)
Improved. Social situation has improved.  -continue to monitor at subsequent visit.

## 2016-05-15 NOTE — Assessment & Plan Note (Signed)
Has not smoker for over a year. Congratulated her on her success.

## 2016-09-25 ENCOUNTER — Ambulatory Visit (INDEPENDENT_AMBULATORY_CARE_PROVIDER_SITE_OTHER): Payer: Medicaid Other | Admitting: Family Medicine

## 2016-09-25 ENCOUNTER — Encounter: Payer: Self-pay | Admitting: Family Medicine

## 2016-09-25 VITALS — BP 122/88 | HR 77 | Temp 98.3°F | Ht 62.0 in | Wt 153.6 lb

## 2016-09-25 DIAGNOSIS — Z0000 Encounter for general adult medical examination without abnormal findings: Secondary | ICD-10-CM | POA: Diagnosis present

## 2016-09-25 DIAGNOSIS — Z111 Encounter for screening for respiratory tuberculosis: Secondary | ICD-10-CM

## 2016-09-25 MED ORDER — ACYCLOVIR 400 MG PO TABS: 400.0000 mg | ORAL_TABLET | Freq: Two times a day (BID) | ORAL | 3 refills | Status: DC

## 2016-09-25 MED ORDER — CITALOPRAM HYDROBROMIDE 20 MG PO TABS: 20.0000 mg | ORAL_TABLET | Freq: Every day | ORAL | 3 refills | Status: DC

## 2016-09-25 NOTE — Progress Notes (Signed)
Gail Bass is a 41 y.o. female who presents to [facility] today for limited work physical:  Work physical:  Needs limited physical for [employer].  Paperwork with her today.  Needs PPD  Concerns:  Mood flashes around time of her menses.  Has also had mood issues (anxiety and depression in past) which are better now, but triggered during menses.  No other concerns.   Last physical unsure Tetanus up to date Dental exam every six months.   PMH reviewed. Patient is a nonsmoker.   Past Medical History:  Diagnosis Date  . Asthma   . Bacterial vaginosis   . BV (bacterial vaginosis) 09/13/2008       . Hypertension 05/11/2013  . Kidney stone   . Pain in the chest 06/06/2015  . Painful lumpy right breast 03/21/2013  . Recurrent sinusitis 06/06/2015  . TUBAL PREGNANCY 09/13/2008   Qualifier: History of  By: Lanier PrudeBolden  MD, Cathrine Musteraineisha    . Vaginal discharge 01/10/2014   Past Surgical History:  Procedure Laterality Date  . CYSTECTOMY    . CYSTOSCOPY WITH RETROGRADE PYELOGRAM, URETEROSCOPY AND STENT PLACEMENT Left 02/22/2014   Procedure: CYSTOSCOPY WITH RETROGRADE PYELOGRAM, URETEROSCOPY, STONE BASKET EXTRACTION ;  Surgeon: Kathi LudwigSigmund I Tannenbaum, MD;  Location: [facility];  Service: Urology;  Laterality: Left;  . tailbone cyst    . TUBAL LIGATION      Medications reviewed. Current Outpatient Prescriptions  Medication Sig Dispense Refill  . acyclovir (ZOVIRAX) 400 MG tablet Take 1 tablet (400 mg total) by mouth 2 (two) times daily. 180 tablet 3  . albuterol (PROVENTIL HFA;VENTOLIN HFA) 108 (90 Base) MCG/ACT inhaler Inhale 2 puffs into the lungs every 4 (four) hours as needed for wheezing or shortness of breath. 1 Inhaler 0  . albuterol (PROVENTIL HFA;VENTOLIN HFA) 108 (90 Base) MCG/ACT inhaler Inhale 2 puffs into the lungs every 4 (four) hours as needed for wheezing or shortness of breath. 1 Inhaler 0  . cetirizine (ZYRTEC) 10 MG tablet Take 1 tablet (10 mg  total) by mouth daily. 90 tablet 1  . citalopram (CELEXA) 20 MG tablet Take 1 tablet (20 mg total) by mouth daily. 30 tablet 3  . clindamycin (CLEOCIN) 150 MG capsule Take 1 capsule (150 mg total) by mouth 2 (two) times daily. 60 capsule 1  . clobetasol (TEMOVATE) 0.05 % external solution Apply daily as needed.  0  . ibuprofen (ADVIL,MOTRIN) 600 MG tablet Take 1 tablet (600 mg total) by mouth every 6 (six) hours as needed. 30 tablet 0  . ipratropium (ATROVENT) 0.06 % nasal spray Place 2 sprays into both nostrils 4 (four) times daily. 15 mL 12  . Lysine 1000 MG TABS Take 1 tablet (1,000 mg total) by mouth daily.  0  . mupirocin ointment (BACTROBAN) 2 % APPLY TO AFFECTED AREA TWICE DAILY  0   No current facility-administered medications for this visit.     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  Family History:  Father and sister with unknown arrythmias  Review of Systems  Constitutional: Negative for fever.  HENT: Negative for congestion, ear discharge, ear pain and hearing loss.   Eyes: Negative for blurred vision.  Respiratory: Negative for cough and wheezing.   Cardiovascular: Negative for chest pain, palpitations and leg swelling.  Gastrointestinal: Negative for nausea, vomiting and abdominal pain.  Genitourinary: Negative for dysuria, hematuria and flank pain.  Musculoskeletal: Negative for neck pain.  Skin: Negative for rash.  Neurological: Negative for dizziness and headaches.  Psychiatric/Behavioral: Negative for depression and suicidal ideas.   Exam: BP 122/88   Pulse 77   Temp 98.3 F (36.8 C) (Oral)   Ht 5\' 2"  (1.575 m)   Wt 153 lb 9.6 oz (69.7 kg)   LMP 08/17/2016 (Approximate)   SpO2 98%   BMI 28.09 kg/m  Gen:  Alert, cooperative patient who appears stated age in no acute distress.  Vital signs reviewed. Head: [place]/AT.   Eyes:  EOMI, PERRL.   Ears:  External ears WNL,  Bilateral TM's normal without retraction, redness or bulging. Nose:  Septum midline  Mouth:  MMM, tonsils non-erythematous, non-edematous.   Neck: No masses or thyromegaly or limitation in range of motion.  No cervical lymphadenopathy. Pulm:  Clear to auscultation bilaterally with good air movement.  No wheezes or rales noted.   Cardiac:  Regular rate and rhythm without murmur auscultated.  Good S1/S2. Abd:  Soft/nondistended/nontender.  Good bowel sounds throughout all four quadrants.  No masses noted.  Ext:  No clubbing/cyanosis/erythema.  No edema noted bilateral lower extremities.   Neuro:  Grossly normal, no gait abnormalities Psych:  Not depressed or anxious appearing.  Conversant and engaged  Impression/Plan: 1. Complete Physical Examination: anticipatory guidance provided.  2.  PPD placed today.  Needs to return Monday for read.  I will complete her paperwork on that day.  3.  PMS symptoms:  Plus component of depression and some anxiety.  Discussion about what to do with this.  She is actually interested in taking something for symptoms.  Start celexa today.  FU in 1 month (LMP started 7/18) around time of her next menses to see how things are going.  Sooner if any worsening

## 2016-09-25 NOTE — Patient Instructions (Signed)
It was great to meet you today.  Come back in the next month and we can see how you're doing on the citalopram.  You can start this tomorrow. Take it one pill a day.  Come back and see me in a month to see how things are going.

## 2016-09-28 ENCOUNTER — Ambulatory Visit: Payer: Medicaid Other | Admitting: *Deleted

## 2016-09-28 DIAGNOSIS — Z111 Encounter for screening for respiratory tuberculosis: Secondary | ICD-10-CM

## 2016-09-28 LAB — TB SKIN TEST
Induration: 0 mm
TB SKIN TEST: NEGATIVE

## 2016-09-28 NOTE — Progress Notes (Signed)
PPD Reading Note PPD read and results entered in Epic. Result: 0 mm induration. Interpretation: negative Allergic reaction: no 

## 2016-11-18 ENCOUNTER — Telehealth: Payer: Self-pay

## 2016-11-18 MED ORDER — CITALOPRAM HYDROBROMIDE 20 MG PO TABS: 20.0000 mg | ORAL_TABLET | Freq: Every day | ORAL | 1 refills | Status: DC

## 2016-11-18 NOTE — Telephone Encounter (Signed)
Patient needs a refill of Celexa. She is scheduled to see Gambino on 11/20/2016 at 1550 hours for a medication review. She called the pharmacy and they told her that she had no more refills left.Gail Bass

## 2016-11-18 NOTE — Telephone Encounter (Signed)
Completed.

## 2016-11-20 ENCOUNTER — Encounter: Payer: Self-pay | Admitting: Family Medicine

## 2016-11-20 ENCOUNTER — Ambulatory Visit (INDEPENDENT_AMBULATORY_CARE_PROVIDER_SITE_OTHER): Payer: Medicaid Other | Admitting: Family Medicine

## 2016-11-20 VITALS — BP 125/70 | HR 68 | Temp 98.0°F | Ht 62.0 in | Wt 155.8 lb

## 2016-11-20 DIAGNOSIS — F32A Depression, unspecified: Secondary | ICD-10-CM

## 2016-11-20 DIAGNOSIS — F329 Major depressive disorder, single episode, unspecified: Secondary | ICD-10-CM

## 2016-11-20 MED ORDER — ALBUTEROL SULFATE HFA 108 (90 BASE) MCG/ACT IN AERS: 2.0000 | INHALATION_SPRAY | RESPIRATORY_TRACT | 3 refills | Status: DC | PRN

## 2016-11-20 MED ORDER — CITALOPRAM HYDROBROMIDE 20 MG PO TABS: 20.0000 mg | ORAL_TABLET | Freq: Every day | ORAL | 3 refills | Status: DC

## 2016-11-20 MED ORDER — CETIRIZINE HCL 10 MG PO TABS
10.0000 mg | ORAL_TABLET | Freq: Every day | ORAL | 1 refills | Status: DC
Start: 2016-11-20 — End: 2019-04-10

## 2016-11-20 MED ORDER — IBUPROFEN 800 MG PO TABS: 800.0000 mg | ORAL_TABLET | Freq: Three times a day (TID) | ORAL | 1 refills | Status: DC | PRN

## 2016-11-20 MED ORDER — ACYCLOVIR 400 MG PO TABS: 400.0000 mg | ORAL_TABLET | Freq: Two times a day (BID) | ORAL | 3 refills | Status: DC

## 2016-11-20 NOTE — Patient Instructions (Signed)
Thank you for coming in today, it was so nice to see you! Today we talked about:    Celexa and other medication refills. We have refilled all your meds today and started you on Ibuprofen.   Please follow up in 3-6 months to see how you are doing on celexa.   If you have any questions or concerns, please do not hesitate to call the office at [phone]. You can also message me directly via MyChart.   Sincerely,  Anders Simmonds, MD

## 2016-11-20 NOTE — Progress Notes (Signed)
Subjective:    Patient ID: Gail Bass , female   DOB: 1975/09/03 , 41 y.o..   MRN: [id number]  HPI  Gail Bass is here for  Chief Complaint  Patient presents with  . med review    Celexa    1. Depression and some anxiety: Was having some symptoms of depression and anxiety around the time of her menses. Possible PMS. Was seen by Dr. Gwendolyn Grant on 09/25/16 who started her on Celexa. She feels like this medication has helped her a lot. Denies any current depression or anxiety. States the her relationships have been better and she feels more herself.    Review of Systems: Per HPI.   Past Medical History: Patient Active Problem List   Diagnosis Date Noted  . Left foot pain 05/15/2016  . Health care maintenance 12/05/2015  . Fatigue 12/05/2015  . Recurrent cold sores 12/05/2015  . PTSD (post-traumatic stress disorder) 12/05/2015  . Folliculitis 11/12/2015  . Impetigo bullosa 08/29/2015  . External hemorrhoids 08/07/2015  . Hair loss 05/02/2013  . Former smoker 03/21/2013  . ASTHMA, INTERMITTENT 12/23/2009  . OVARIAN CYST, LEFT 10/29/2008  . MIGRAINE HEADACHE 09/13/2008  . MENORRHAGIA 09/13/2008  . DEPRESSION 06/21/2008    Medications: reviewed and updated Current Outpatient Prescriptions  Medication Sig Dispense Refill  . acyclovir (ZOVIRAX) 400 MG tablet Take 1 tablet (400 mg total) by mouth 2 (two) times daily. 180 tablet 3  . albuterol (PROVENTIL HFA;VENTOLIN HFA) 108 (90 Base) MCG/ACT inhaler Inhale 2 puffs into the lungs every 4 (four) hours as needed for wheezing or shortness of breath. 1 Inhaler 3  . cetirizine (ZYRTEC) 10 MG tablet Take 1 tablet (10 mg total) by mouth daily. 90 tablet 1  . citalopram (CELEXA) 20 MG tablet Take 1 tablet (20 mg total) by mouth daily. 90 tablet 3  . ibuprofen (ADVIL,MOTRIN) 800 MG tablet Take 1 tablet (800 mg total) by mouth every 8 (eight) hours as needed. 30 tablet 1  . Lysine 1000 MG TABS Take 1 tablet (1,000 mg total) by  mouth daily.  0   No current facility-administered medications for this visit.     Social Hx:  reports that she quit smoking about 15 months ago. She started smoking about 4 years ago. She has never used smokeless tobacco.   Objective:   BP 125/70 (BP Location: Left Arm, Patient Position: Sitting, Cuff Size: Normal)   Pulse 68   Temp 98 F (36.7 C) (Oral)   Ht  (1.575 m)   Wt 155 lb 12.8 oz (70.7 kg)   LMP 11/09/2016   SpO2 98%   BMI 28.50 kg/m  Physical Exam  Gen: NAD, alert, cooperative with exam, well-appearing Psych: good insight, normal mood and affect  Assessment & Plan:  DEPRESSION No depression or anxiety today per patient report. Would like a refill on her Celexa. Refills provided for Celexa as well as her other chronic medications. Follow up in 6 months with PCP.   Meds ordered this encounter  Medications  . citalopram (CELEXA) 20 MG tablet    Sig: Take 1 tablet (20 mg total) by mouth daily.    Dispense:  90 tablet    Refill:  3  . ibuprofen (ADVIL,MOTRIN) 800 MG tablet    Sig: Take 1 tablet (800 mg total) by mouth every 8 (eight) hours as needed.    Dispense:  30 tablet    Refill:  1  . cetirizine (ZYRTEC) 10 MG tablet  Sig: Take 1 tablet (10 mg total) by mouth daily.    Dispense:  90 tablet    Refill:  1  . albuterol (PROVENTIL HFA;VENTOLIN HFA) 108 (90 Base) MCG/ACT inhaler    Sig: Inhale 2 puffs into the lungs every 4 (four) hours as needed for wheezing or shortness of breath.    Dispense:  1 Inhaler    Refill:  3  . acyclovir (ZOVIRAX) 400 MG tablet    Sig: Take 1 tablet (400 mg total) by mouth 2 (two) times daily.    Dispense:  180 tablet    Refill:  3    Gail Simmonds, MD [facility], PGY-3

## 2016-11-23 NOTE — Assessment & Plan Note (Addendum)
No depression or anxiety today per patient report. Would like a refill on her Celexa. Refills provided for Celexa as well as her other chronic medications. Follow up in 6 months with PCP.

## 2016-11-26 ENCOUNTER — Other Ambulatory Visit: Payer: Self-pay | Admitting: Internal Medicine

## 2016-11-30 ENCOUNTER — Ambulatory Visit: Payer: Self-pay | Admitting: Family Medicine

## 2016-12-02 IMAGING — CR DG CHEST 2V
2 series · 2 of 2 positions shown · non-contrast
Comparison: Chest x-ray of 11/10/2013

CLINICAL DATA: Mid chest pain, shortness of breath with exertion
for 1 month

EXAM:
CHEST  2 VIEW

[view not recorded (1 of 2)]
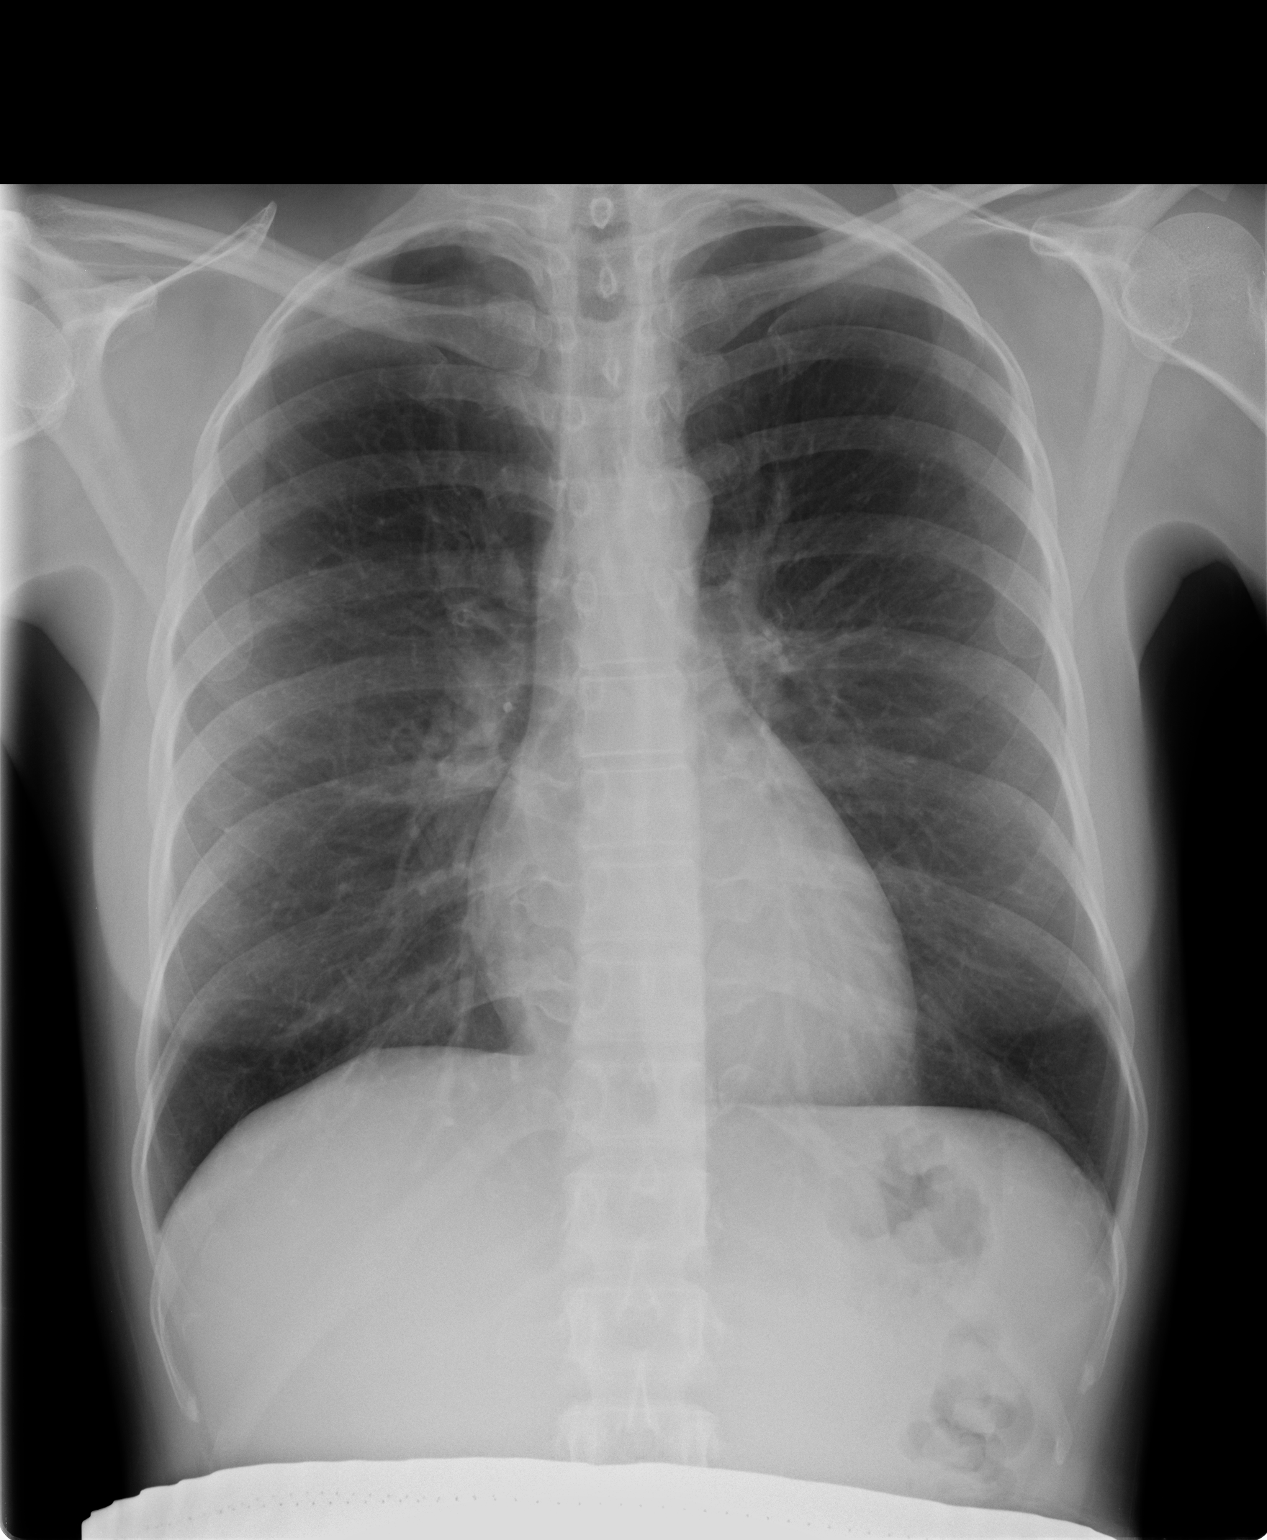

[view not recorded (2 of 2)]
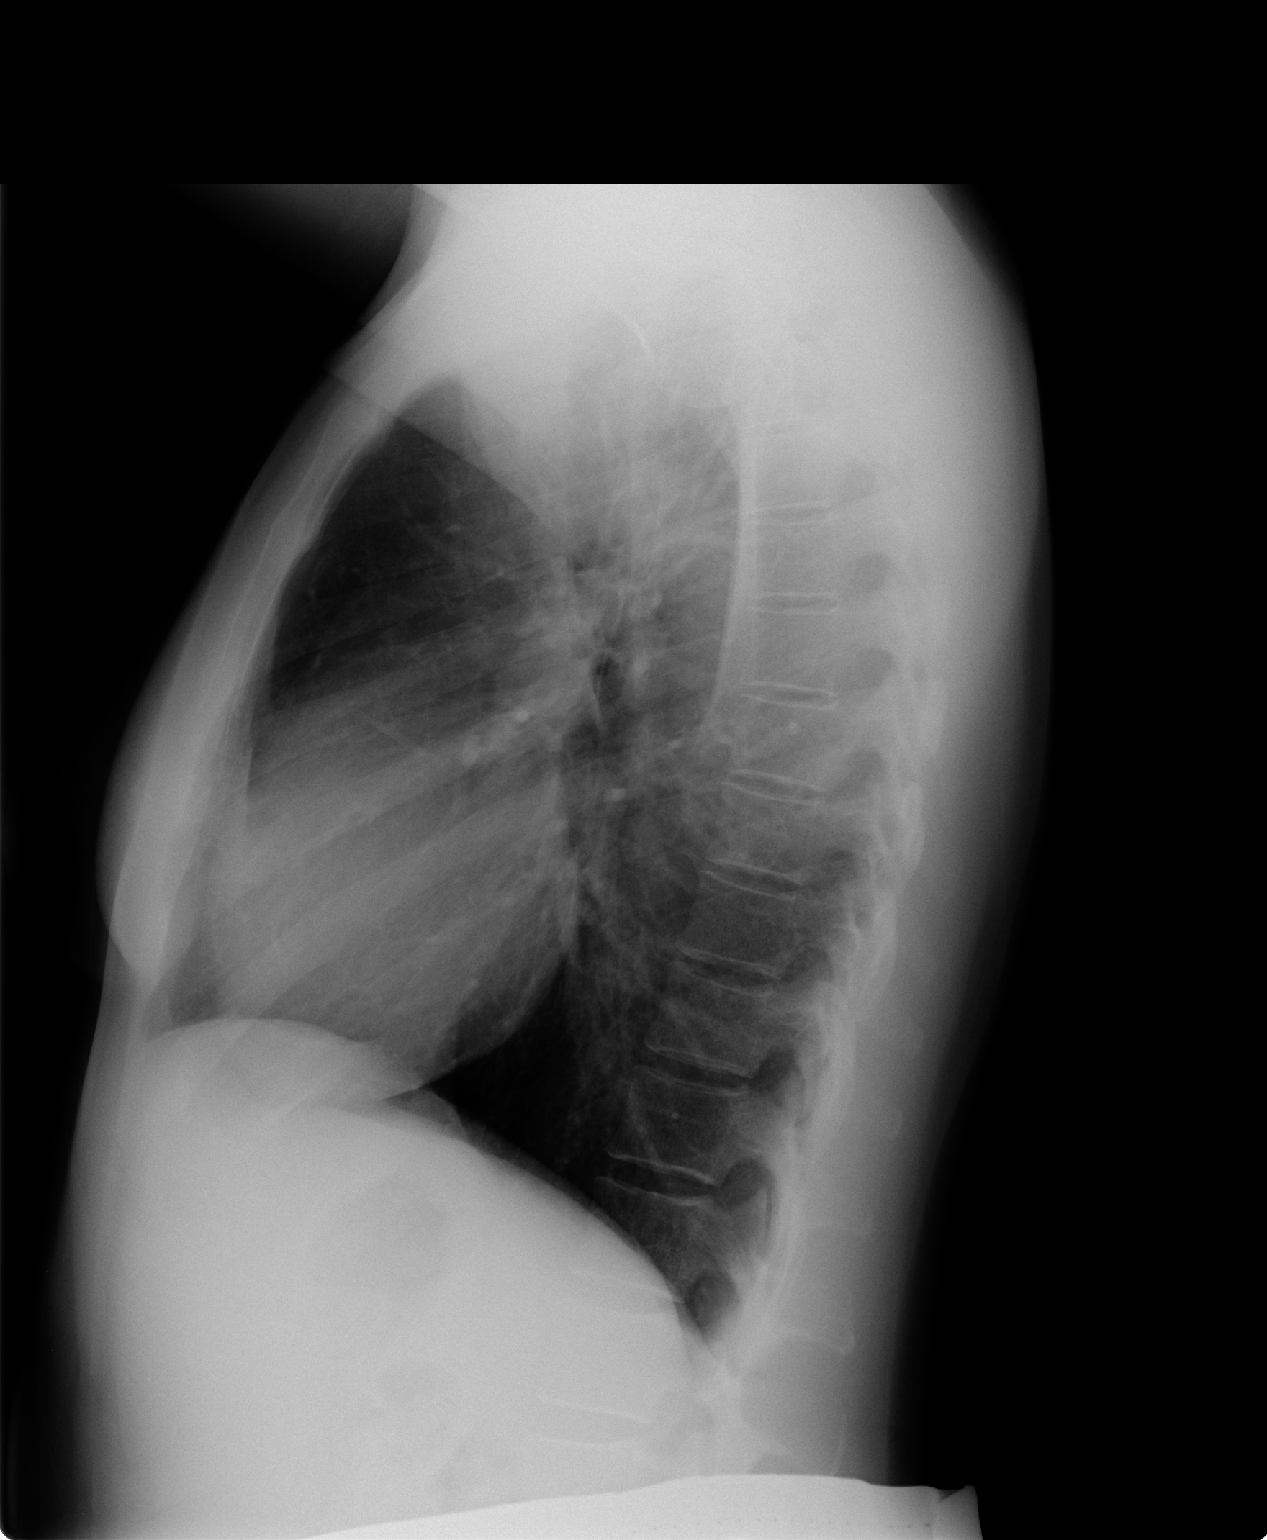

[2 of 2 positions shown; findings below may reference images not displayed]

FINDINGS: No active infiltrate or effusion is seen. Minimal peribronchial
thickening is present. Mediastinal and hilar contours are
unremarkable. The heart is within normal limits in size. No bony
abnormality is seen.
IMPRESSION: No pneumonia.  Mild peribronchial thickening.

## 2017-04-21 ENCOUNTER — Ambulatory Visit (HOSPITAL_COMMUNITY)
Admission: EM | Admit: 2017-04-21 | Discharge: 2017-04-21 | Disposition: A | Discharge status: Home / Self Care | Payer: Medicaid Other | Attending: Family Medicine | Admitting: Family Medicine

## 2017-04-21 ENCOUNTER — Ambulatory Visit (INDEPENDENT_AMBULATORY_CARE_PROVIDER_SITE_OTHER): Payer: Medicaid Other

## 2017-04-21 ENCOUNTER — Encounter (HOSPITAL_COMMUNITY): Payer: Self-pay | Admitting: Family Medicine

## 2017-04-21 DIAGNOSIS — R05 Cough: Secondary | ICD-10-CM | POA: Diagnosis not present

## 2017-04-21 DIAGNOSIS — R509 Fever, unspecified: Secondary | ICD-10-CM

## 2017-04-21 DIAGNOSIS — J181 Lobar pneumonia, unspecified organism: Secondary | ICD-10-CM

## 2017-04-21 DIAGNOSIS — J189 Pneumonia, unspecified organism: Secondary | ICD-10-CM

## 2017-04-21 MED ORDER — AZITHROMYCIN 250 MG PO TABS: 250.0000 mg | ORAL_TABLET | Freq: Every day | ORAL | 0 refills | Status: DC

## 2017-04-21 MED ORDER — MICONAZOLE NITRATE 200 MG VA SUPP: 200.0000 mg | Freq: Every day | VAGINAL | 0 refills | Status: DC

## 2017-04-21 NOTE — Discharge Instructions (Addendum)
Start azithromycin as directed.  As discussed, this can interact with your citalopram, I have attached information on symptoms to watch out for with QT prolongation.  Continue albuterol as needed for shortness of breath and wheezing. Keep hydrated, your urine should be clear to pale yellow in color.  Follow-up with PCP for recheck. Monitor for any worsening of symptoms, chest pain, shortness of breath, wheezing, passing out, palpitations, seizures, swelling of the throat, go to the emergency department for further evaluation.
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
## 2017-04-21 NOTE — ED Provider Notes (Signed)
[facility]    CSN: [id number] Arrival date & time: 04/21/17  1543     History   Chief Complaint Chief Complaint  Patient presents with  . Cough  . Shortness of Breath    HPI Gail Bass is a 42 y.o. female.   42 year old female comes in for few day history of URI symptoms.  Patient states that she had sinusitis symptoms back in January that never quite resolved.  She continues to have sinus pressure, rhinorrhea, nasal congestion.  She now has nonproductive cough, changes in voice, shortness of breath.  She is had increased use of albuterol to 4 times a day with some relief.  T-max of 99-100.  OTC cold medication without relief.  Former smoker, 15 years, 0.5 pack/day.       Past Medical History:  Diagnosis Date  . Asthma   . Bacterial vaginosis   . BV (bacterial vaginosis) 09/13/2008       . Hypertension 05/11/2013  . Kidney stone   . Pain in the chest 06/06/2015  . Painful lumpy right breast 03/21/2013  . Recurrent sinusitis 06/06/2015  . TUBAL PREGNANCY 09/13/2008   Qualifier: History of  By: Lanier Prude  MD, Cathrine Muster    . Vaginal discharge 01/10/2014    Patient Active Problem List   Diagnosis Date Noted  . Left foot pain 05/15/2016  . Health care maintenance 12/05/2015  . Fatigue 12/05/2015  . Recurrent cold sores 12/05/2015  . PTSD (post-traumatic stress disorder) 12/05/2015  . Folliculitis 11/12/2015  . Impetigo bullosa 08/29/2015  . External hemorrhoids 08/07/2015  . Hair loss 05/02/2013  . Former smoker 03/21/2013  . ASTHMA, INTERMITTENT 12/23/2009  . OVARIAN CYST, LEFT 10/29/2008  . MIGRAINE HEADACHE 09/13/2008  . MENORRHAGIA 09/13/2008  . Depression 06/21/2008    Past Surgical History:  Procedure Laterality Date  . CYSTECTOMY    . CYSTOSCOPY WITH RETROGRADE PYELOGRAM, URETEROSCOPY AND STENT PLACEMENT Left 02/22/2014   Procedure: CYSTOSCOPY WITH RETROGRADE PYELOGRAM, URETEROSCOPY, STONE BASKET EXTRACTION ;  Surgeon: Kathi Ludwig,  MD;  Location: [facility];  Service: Urology;  Laterality: Left;  . tailbone cyst    . TUBAL LIGATION      OB History    No data available       Home Medications    Prior to Admission medications   Medication Sig Start Date End Date Taking? Authorizing Provider  acyclovir (ZOVIRAX) 400 MG tablet Take 1 tablet (400 mg total) by mouth 2 (two) times daily. 11/20/16   Beaulah Dinning, MD  albuterol (PROVENTIL HFA;VENTOLIN HFA) 108 (90 Base) MCG/ACT inhaler Inhale 2 puffs into the lungs every 4 (four) hours as needed for wheezing or shortness of breath. 11/20/16   Beaulah Dinning, MD  azithromycin (ZITHROMAX) 250 MG tablet Take 1 tablet (250 mg total) by mouth daily. Take first 2 tablets together, then 1 every day until finished. 04/21/17   Cathie Hoops, Jadeyn Hargett V, PA-C  cetirizine (ZYRTEC) 10 MG tablet Take 1 tablet (10 mg total) by mouth daily. 11/20/16   Beaulah Dinning, MD  citalopram (CELEXA) 20 MG tablet Take 1 tablet (20 mg total) by mouth daily. 11/20/16   Beaulah Dinning, MD  ibuprofen (ADVIL,MOTRIN) 800 MG tablet Take 1 tablet (800 mg total) by mouth every 8 (eight) hours as needed. 11/20/16   Beaulah Dinning, MD  Lysine 1000 MG TABS Take 1 tablet (1,000 mg total) by mouth daily. 12/05/15   Uvaldo Rising, MD  miconazole (MICONAZOLE 3) 200  MG vaginal suppository Place 1 suppository (200 mg total) vaginally at bedtime. 04/21/17   Belinda FisherYu, Jenina Moening V, PA-C  PROAIR HFA 108 [phone](90 Base) MCG/ACT inhaler inhale 2 puffs by mouth every 4 hours if needed for wheezing or shortness of breath 11/30/16   Tobey GrimWalden, Jeffrey H, MD    Family History Family History  Problem Relation Age of Onset  . Arrhythmia Father        Pacemakers placed didn't take passed away 3 moths later  . Arrhythmia Sister        Pacemaker didn't take to body passed away 3 months ;later  . Hypertension Maternal Aunt   . Heart failure Maternal Grandmother   . Hypertension Maternal Grandfather   . Diabetes Other   . CAD Other      Social History Social History   Tobacco Use  . Smoking status: Former Smoker    Start date: 11/19/2012    Last attempt to quit: 08/01/2015    Years since quitting: 1.7  . Smokeless tobacco: Never Used  Substance Use Topics  . Alcohol use: No  . Drug use: Yes    Types: Marijuana     Allergies   Doxycycline and Lorcet [hydrocodone-acetaminophen]   Review of Systems Review of Systems  Reason unable to perform ROS: See HPI as above.     Physical Exam Triage Vital Signs ED Triage Vitals  Enc Vitals Group     BP 04/21/17 1632 (!) 150/96     Pulse Rate 04/21/17 1632 84     Resp 04/21/17 1632 18     Temp 04/21/17 1632 98.5 F (36.9 C)     Temp src --      SpO2 04/21/17 1632 100 %     Weight --      Height --      Head Circumference --      Peak Flow --      Pain Score 04/21/17 1630 7     Pain Loc --      Pain Edu? --      Excl. in GC? --    No data found.  Updated Vital Signs BP (!) 150/96   Pulse 84   Temp 98.5 F (36.9 C)   Resp 18   LMP 04/10/2017   SpO2 100%   Physical Exam  Constitutional: She is oriented to person, place, and time. She appears well-developed and well-nourished. No distress.  HENT:  Head: Normocephalic and atraumatic.  Right Ear: Tympanic membrane, external ear and ear canal normal. Tympanic membrane is not erythematous and not bulging.  Left Ear: Tympanic membrane, external ear and ear canal normal. Tympanic membrane is not erythematous and not bulging.  Nose: Rhinorrhea present. Right sinus exhibits maxillary sinus tenderness and frontal sinus tenderness. Left sinus exhibits maxillary sinus tenderness and frontal sinus tenderness.  Mouth/Throat: Uvula is midline and mucous membranes are normal. Posterior oropharyngeal erythema present. No tonsillar exudate.  Eyes: Conjunctivae are normal. Pupils are equal, round, and reactive to light.  Neck: Normal range of motion. Neck supple.  Cardiovascular: Normal rate, regular rhythm and  normal heart sounds. Exam reveals no gallop and no friction rub.  No murmur heard. Pulmonary/Chest: Effort normal and breath sounds normal. She has no decreased breath sounds. She has no wheezes. She has no rhonchi. She has no rales.  Lymphadenopathy:    She has no cervical adenopathy.  Neurological: She is alert and oriented to person, place, and time.  Skin: Skin is warm and dry.  Psychiatric: She has a normal mood and affect. Her behavior is normal. Judgment normal.     UC Treatments / Results  Labs (all labs ordered are listed, but only abnormal results are displayed) Labs Reviewed - No data to display  EKG  EKG Interpretation None       Radiology Dg Chest 2 View  Result Date: 04/21/2017 CLINICAL DATA:  42 y/o F; sick for 5 days with cough and low-grade fever. Upper left portion of back hurts. Severe headache. EXAM: CHEST  2 VIEW COMPARISON:  06/05/2015 chest radiograph FINDINGS: Stable normal cardiac silhouette given projection and technique. Right mid lung zone patchy consolidation suspicious for pneumonia. No pleural effusion or pneumothorax. Bones are unremarkable. IMPRESSION: Right mid lung zone patchy consolidation suspicious for pneumonia. Electronically Signed   By: Mitzi Hansen M.D.   On: 04/21/2017 18:10    Procedures Procedures (including critical care time)  Medications Ordered in UC Medications - No data to display   Initial Impression / Assessment and Plan / UC Course  I have reviewed the triage vital signs and the nursing notes.  Pertinent labs & imaging results that were available during my care of the patient were reviewed by me and considered in my medical decision making (see chart for details).     Discussed x-ray results with patient.  Start azithromycin as directed.  Discussed possible QT prolongation given citalopram use, risks and benefits discussed, patient agrees to start azithromycin.  Other symptomatic treatment discussed.  Patient  to follow-up with PCP for reevaluation needed.  Return precautions given.  Patient expresses understanding and agrees to plan.  Case discussed with Dr. Tracie Harrier, who agrees to plan.  Final Clinical Impressions(s) / UC Diagnoses   Final diagnoses:  Pneumonia of right middle lobe due to infectious organism [facility])    ED Discharge Orders        Ordered    azithromycin (ZITHROMAX) 250 MG tablet  Daily     04/21/17 1835    miconazole (MICONAZOLE 3) 200 MG vaginal suppository  Daily at bedtime     04/21/17 1856        Belinda Fisher, PA-C 04/21/17 2050

## 2017-04-27 ENCOUNTER — Encounter: Payer: Self-pay | Admitting: Family Medicine

## 2017-04-27 ENCOUNTER — Other Ambulatory Visit: Payer: Self-pay

## 2017-04-27 ENCOUNTER — Ambulatory Visit: Payer: Medicaid Other | Admitting: Family Medicine

## 2017-04-27 VITALS — BP 102/84 | HR 85 | Temp 98.2°F | Ht 62.0 in | Wt 164.8 lb

## 2017-04-27 DIAGNOSIS — J45901 Unspecified asthma with (acute) exacerbation: Secondary | ICD-10-CM | POA: Diagnosis not present

## 2017-04-27 DIAGNOSIS — R062 Wheezing: Secondary | ICD-10-CM

## 2017-04-27 DIAGNOSIS — J189 Pneumonia, unspecified organism: Secondary | ICD-10-CM | POA: Diagnosis not present

## 2017-04-27 MED ORDER — ALBUTEROL SULFATE (2.5 MG/3ML) 0.083% IN NEBU
2.5000 mg | INHALATION_SOLUTION | Freq: Once | RESPIRATORY_TRACT | Status: AC
  Administered 2017-04-27: 2.5 mg via RESPIRATORY_TRACT

## 2017-04-27 MED ORDER — IPRATROPIUM BROMIDE 0.02 % IN SOLN
0.5000 mg | Freq: Once | RESPIRATORY_TRACT | Status: AC
Start: 2017-04-27 — End: 2017-04-27
  Administered 2017-04-27: 0.5 mg via RESPIRATORY_TRACT

## 2017-04-27 MED ORDER — GUAIFENESIN-CODEINE 200-10 MG/5ML PO LIQD: 5.0000 mL | Freq: Three times a day (TID) | ORAL | 0 refills | Status: DC | PRN

## 2017-04-27 MED ORDER — FLUCONAZOLE 150 MG PO TABS: 150.0000 mg | ORAL_TABLET | Freq: Once | ORAL | 0 refills | Status: AC

## 2017-04-27 MED ORDER — CEFDINIR 300 MG PO CAPS: 300.0000 mg | ORAL_CAPSULE | Freq: Two times a day (BID) | ORAL | 0 refills | Status: DC

## 2017-04-27 MED ORDER — IPRATROPIUM-ALBUTEROL 0.5-2.5 (3) MG/3ML IN SOLN: 3.0000 mL | Freq: Once | RESPIRATORY_TRACT | Status: DC

## 2017-04-27 MED ORDER — PREDNISONE 10 MG PO TABS
ORAL_TABLET | ORAL | 0 refills | Status: DC
Start: 2017-04-27 — End: 2017-10-01

## 2017-04-27 NOTE — Patient Instructions (Signed)
It was good to see you again today  Take the prednisone as follows:  Take 6 pills today, 5 pills tomorrow, 4 pills today after, 3 pills after, 2 pills after, 1 pill last day.  Keep using your albuterol inhaler to help with coughing at night.    Use the cough syrup as needed for sleep and cough.   Take the Omnicef once in the AM and once in the PM as an antibiotic.  This should get you feeling better.  Let me know if you're no better in a week or so.

## 2017-04-27 NOTE — Progress Notes (Signed)
Subjective:    Gail Bass is a 42 y.o. female who [facility] for PNA:  1.  CAP:  Pt diagnosed with CAP after urgent care visit last week.  Prescribed 5 days of azithromcyin.  No real improvement in sx's since then.  Some decreased chills, but now "feels hot" especially at night.  Cough worse at night.  Alternates b/n dry hacking and productive of green sputum.  Malaise present.  Decreased appetite.  Vague nausea at times without any overt vomiting or abd pain.  No diarrhea.  No weight loss.  No chest pain.   ROS as above per HPI.    The following portions of the patient's history were reviewed and updated as appropriate: allergies, current medications, past medical history, family and social history, and problem list. Patient is a nonsmoker.    PMH reviewed.  Past Medical History:  Diagnosis Date  . Asthma   . Bacterial vaginosis   . BV (bacterial vaginosis) 09/13/2008       . Hypertension 05/11/2013  . Kidney stone   . Pain in the chest 06/06/2015  . Painful lumpy right breast 03/21/2013  . Recurrent sinusitis 06/06/2015  . TUBAL PREGNANCY 09/13/2008   Qualifier: History of  By: Lanier PrudeBolden  MD, Cathrine Musteraineisha    . Vaginal discharge 01/10/2014   Past Surgical History:  Procedure Laterality Date  . CYSTECTOMY    . CYSTOSCOPY WITH RETROGRADE PYELOGRAM, URETEROSCOPY AND STENT PLACEMENT Left 02/22/2014   Procedure: CYSTOSCOPY WITH RETROGRADE PYELOGRAM, URETEROSCOPY, STONE BASKET EXTRACTION ;  Surgeon: Kathi LudwigSigmund I Tannenbaum, MD;  Location: [facility];  Service: Urology;  Laterality: Left;  . tailbone cyst    . TUBAL LIGATION      Medications reviewed. Current Outpatient Medications  Medication Sig Dispense Refill  . acyclovir (ZOVIRAX) 400 MG tablet Take 1 tablet (400 mg total) by mouth 2 (two) times daily. 180 tablet 3  . albuterol (PROVENTIL HFA;VENTOLIN HFA) 108 (90 Base) MCG/ACT inhaler Inhale 2 puffs into the lungs every 4 (four) hours as needed for wheezing or shortness  of breath. 1 Inhaler 3  . azithromycin (ZITHROMAX) 250 MG tablet Take 1 tablet (250 mg total) by mouth daily. Take first 2 tablets together, then 1 every day until finished. 6 tablet 0  . cetirizine (ZYRTEC) 10 MG tablet Take 1 tablet (10 mg total) by mouth daily. 90 tablet 1  . citalopram (CELEXA) 20 MG tablet Take 1 tablet (20 mg total) by mouth daily. 90 tablet 3  . ibuprofen (ADVIL,MOTRIN) 800 MG tablet Take 1 tablet (800 mg total) by mouth every 8 (eight) hours as needed. 30 tablet 1  . Lysine 1000 MG TABS Take 1 tablet (1,000 mg total) by mouth daily.  0  . miconazole (MICONAZOLE 3) 200 MG vaginal suppository Place 1 suppository (200 mg total) vaginally at bedtime. 3 suppository 0  . PROAIR HFA 108 (90 Base) MCG/ACT inhaler inhale 2 puffs by mouth every 4 hours if needed for wheezing or shortness of breath 8.5 g 3   No current facility-administered medications for this visit.      Objective:   Physical Exam BP 102/84   Pulse 85   Temp 98.2 F (36.8 C) (Oral)   Ht 5\' 2"  (1.575 m)   Wt 164 lb 12.8 oz (74.8 kg)   LMP 04/10/2017 (Exact Date)   SpO2 97%   BMI 30.14 kg/m  Gen:  Alert, cooperative patient who appears stated age in no acute distress.  Vital  signs reviewed. HEENT: EOMI,  MMM Cardiac:  Regular rate and rhythm   Pulm:  Wheezing present BL bases Abd:  Soft/nondistended/nontender.   Exts: Non edematous BL  LE, warm and well perfused.   Imp/Plan: 1.  CAP 2.  Asthma exacerbation: -No risk factors for strep pneumo but no real improvement with just azithromycin. -Treating as incomplete treatment of pneumonia plus asthma exacerbation. -She received a DuoNeb treatment here and felt better afterwards and her wheezing resolved on pulmonary exam after treatment. -Omnicef is antibiotic for the next 7 days.  This should cover strep plus anything not covered by macrolide. -Prednisone taper. -Guaifenesin codeine to help with cough and sleep at night. -Follow-up in a week if no  improvement.  Sooner if worsening.

## 2017-04-29 ENCOUNTER — Encounter: Payer: Self-pay | Admitting: Family Medicine

## 2017-09-30 NOTE — Progress Notes (Signed)
Subjective:   Gail Bass is a 42 y.o. female with a history of BV and tubal pregnancy (2010) here for vaginal discharge. She has an extensive history of recurrent bacterial vaginosis treated with oral and topical flagyl. She also has a history Gonorrhea/Chlamydia, PID, and vulvovaginal candidal infections. She has had two tubal pregnancies in 2007 and 2008 leading to bilateral tubal ligation.   VAGINAL DISCHARGE- chronic and recurring, particularly around her periods. She notes she has changed her hygiene techniques such as not using tampons, only using pads and using gentle soaps without douching.  Having vaginal discharge for 2 months, notes it seems to be associated with the start of her cycle. Discharge consistency: thick  Discharge color: white Odor: "fishy" Itching: No itching Medications tried: Has not treated with anything  Recent antibiotic use: None  Sex in last month: last sexual intercourse was 2 days ago, she admits to sexual activity multiple times were week without condom use, notes she is in a monogamous relationship but not 100% sure about his loyalty, tubal ligation preventing pregnancy  Possible STD exposure: Possible  Symptoms Fever: no Dysuria:no  -Notes having to get up a lot at night to pee (2-3x), does note some increased thirst during the day but does have a hard time remembering to drink water Vaginal bleeding: no Abdomen or Pelvic pain: no Back pain: Some lower lumbar back pain  -Notes she is constantly on her feet at work, bending over so this is most likely secondary to that Genital sores or ulcers: No Rash: no Pain during sex/Dyspareunia: Had some last week, but has sense went away Missed menstrual period: No GI symptoms: No N/V/C/D/abdominal pain   Gyn concerns/Preventative healthcare  Last menstrual period: 09/02/17  Regular periods: yes (q30 days), heavy (5 soaked pads/day)  Sexually active: yes, daily/multiple times per week    Contraception or hormonal therapy: tubal ligation  Hx of STD: Patient desires STD screening  Hot flashes: No. Some sweats right before her period each month  Last pap smear: 04/19/14 - normal at the time  2. Thickened toe nail: -Thickening and discoloration (yellow color) of toe nail x 1 year after visiting nail salon. Has used OTC topical treatments over the year without resolution or any help. Nail appears to be separating from nail bed now.   3. Depression Medicine:  -Notes significant weight gain on Celexa. Stopped taking it x 3 weeks ago due to weight gain. She notes her mood swings have started to worsen again. Interested in discussing other options.  -Recently got a puppy ("Max") and lives at [place] [facility]. Not allowed pets. Believes the puppy has really helped her mood. Provides comfort and support. She is not allowed to have animals at her house and is interested in being evaluated for "Emotional Support Animal" so she can keep her animal at home.  PMH, Surgical Hx, Family Hx, Social History reviewed and updated as below.  Past Medical History:  Diagnosis Date  . Asthma   . Bacterial vaginosis   . BV (bacterial vaginosis) 09/13/2008       . Hypertension 05/11/2013  . Kidney stone   . Pain in the chest 06/06/2015  . Painful lumpy right breast 03/21/2013  . Recurrent sinusitis 06/06/2015  . TUBAL PREGNANCY 09/13/2008   Qualifier: History of  By: Lanier PrudeBolden  MD, Cathrine Musteraineisha    . Vaginal discharge 01/10/2014   Past Surgical History:  Procedure Laterality Date  . CYSTECTOMY    . CYSTOSCOPY WITH RETROGRADE  PYELOGRAM, URETEROSCOPY AND STENT PLACEMENT Left 02/22/2014   Procedure: CYSTOSCOPY WITH RETROGRADE PYELOGRAM, URETEROSCOPY, STONE BASKET EXTRACTION ;  Surgeon: Kathi Ludwig, MD;  Location: [facility];  Service: Urology;  Laterality: Left;  . tailbone cyst    . TUBAL LIGATION     Family History  Problem Relation Age of Onset  . Arrhythmia Father         Pacemakers placed didn't take passed away 3 moths later  . Arrhythmia Sister        Pacemaker didn't take to body passed away 3 months ;later  . Hypertension Maternal Aunt   . Heart failure Maternal Grandmother   . Hypertension Maternal Grandfather   . Diabetes Other   . CAD Other      Objective:   Vitals:   10/01/17 1013  BP: 122/76  Pulse: 65  Resp: 16  Temp: 98.2 F (36.8 C)  SpO2: 97%   Exam: General: well appearing, NAD. HEENT: NCAT. Cardiovascular: RRR. No murmurs, rubs, or gallops. Respiratory: CTAB. No rales, rhonchi, or wheeze. Abdomen: soft, nontender, nondistended. Extremities: warm, well perfused. No LE edema. Right big toenail: thick and discolored and slightly separated from nail bed at distal end of nail bed  Skin: Warm, dry, intact. Neuro: No focal deficits.  Pelvic Exam:        External: normal female genitalia without lesions or masses        Vagina: normal without lesions or masses. No abnormal discharge apparent on exam.        Cervix: normal without lesions or masses        Pap smear: performed        Samples for Wet prep, GC/Chlamydia obtained Speculum Exam: Ext genitalia: wnl; Vaginal discharge: minimal; Cervix: normal appearing  Bimanual Exam: No Cervical motion tenderness; No Vaginal wall defects; Adnexa nontender, Normal, nongravid uterus  Assessment/Plan:   MENORRHAGIA Notes heavy periods (soaking through 5 pads per day), although they are regular with no irregular bleeding. Due to symptoms of fatigue, will obtain CBC to rule-out anemia secondary to menorrhagia  Also due to her age, recommended PCP follow-up and further discussion of heavy periods.  Consider endometrial biopsy to rule-out underlying causes for menorrhagia   Consider IUD/birth control options to help with her symptoms  Vaginal discharge Patient was due for pap smear. Performed today. Per patient request, STD testing performed (GC/Chlamydia, Trich, RPR, HIV, acute hep  panel) Wet prep obtained Due to s/s of nocturia and recurrent candidal infections, obtained urinalysis to assess for glucosuria   Will contact patient with results.  Due to recurrent BV infections, consider prophylactic treatment if continues to be a problem for patient (hx of BV infections at least 2x/year since 2012).   Prophylactic Tx:  Metronidazole vaginal gel 0.75% 2x/week for 6 months  Onychomycosis of great toe -After in depth discussion, patient desired treatment for her right toe onychomycosis.  -Due to failed OTC topical treatment, will begin Terbinafine 250mg  PO x 12 weeks -CBC with diff obtained today for initial liver function  -Patient to return in 6 weeks for lab visit for repeat LFT's (future order)  Depression Patient discontinued Celexa due to weight gain x 2-3 weeks ago. Has noticed returning of depression symptoms, although her new puppy is helping Recommended follow-up with PCP at earliest convenience for further discussion of depression, weight gain, and alternative anti-depressant options.   Recurrent cold sores Refilled Acyclovir, per patient request   Orders Placed This Encounter  Procedures  . CBC  with Differential  . HIV antibody (with reflex)  . RPR  . Acute Hep Panel & Hep B Surface Ab  . Hepatic function panel    Standing Status:   Future    Standing Expiration Date:   10/02/2018  . POCT Wet Prep [employer])  . POCT urinalysis dipstick  . POCT UA - Microscopic Only    Meds ordered this encounter  Medications  . DISCONTD: terbinafine (LAMISIL) tablet 250 mg  . acyclovir (ZOVIRAX) 400 MG tablet    Sig: Take 1 tablet (400 mg total) by mouth 2 (two) times daily.    Dispense:  180 tablet    Refill:  3  . terbinafine (LAMISIL) 250 MG tablet    Sig: Take 1 tablet (250 mg total) by mouth daily.    Dispense:  30 tablet    Refill:  2

## 2017-10-01 ENCOUNTER — Other Ambulatory Visit: Payer: Self-pay

## 2017-10-01 ENCOUNTER — Other Ambulatory Visit (HOSPITAL_COMMUNITY)
Admission: RE | Admit: 2017-10-01 | Discharge: 2017-10-01 | Disposition: A | Discharge status: Home / Self Care | Payer: Medicaid Other | Source: Ambulatory Visit | Attending: Family Medicine | Admitting: Family Medicine

## 2017-10-01 ENCOUNTER — Encounter: Payer: Self-pay | Admitting: Family Medicine

## 2017-10-01 ENCOUNTER — Ambulatory Visit (INDEPENDENT_AMBULATORY_CARE_PROVIDER_SITE_OTHER): Payer: Medicaid Other | Admitting: Family Medicine

## 2017-10-01 VITALS — BP 122/76 | HR 65 | Temp 98.2°F | Resp 16 | Ht 62.0 in | Wt 166.5 lb

## 2017-10-01 DIAGNOSIS — B351 Tinea unguium: Secondary | ICD-10-CM

## 2017-10-01 DIAGNOSIS — N920 Excessive and frequent menstruation with regular cycle: Secondary | ICD-10-CM | POA: Insufficient documentation

## 2017-10-01 DIAGNOSIS — Z124 Encounter for screening for malignant neoplasm of cervix: Secondary | ICD-10-CM

## 2017-10-01 DIAGNOSIS — Z114 Encounter for screening for human immunodeficiency virus [HIV]: Secondary | ICD-10-CM

## 2017-10-01 DIAGNOSIS — N898 Other specified noninflammatory disorders of vagina: Secondary | ICD-10-CM | POA: Diagnosis not present

## 2017-10-01 DIAGNOSIS — B001 Herpesviral vesicular dermatitis: Secondary | ICD-10-CM | POA: Diagnosis not present

## 2017-10-01 DIAGNOSIS — F32A Depression, unspecified: Secondary | ICD-10-CM

## 2017-10-01 DIAGNOSIS — F329 Major depressive disorder, single episode, unspecified: Secondary | ICD-10-CM

## 2017-10-01 LAB — POCT WET PREP (WET MOUNT)
Clue Cells Wet Prep Whiff POC: NEGATIVE
Trichomonas Wet Prep HPF POC: ABSENT

## 2017-10-01 LAB — POCT URINALYSIS DIP (MANUAL ENTRY)
Bilirubin, UA: NEGATIVE
Glucose, UA: NEGATIVE mg/dL
Ketones, POC UA: NEGATIVE mg/dL
Leukocytes, UA: NEGATIVE
NITRITE UA: NEGATIVE
PROTEIN UA: NEGATIVE mg/dL
Spec Grav, UA: 1.025 (ref 1.010–1.025)
Urobilinogen, UA: 0.2 E.U./dL
pH, UA: 6 (ref 5.0–8.0)

## 2017-10-01 LAB — POCT UA - MICROSCOPIC ONLY

## 2017-10-01 MED ORDER — TERBINAFINE HCL 250 MG PO TABS: 250.0000 mg | ORAL_TABLET | Freq: Every day | ORAL | 2 refills | Status: DC

## 2017-10-01 MED ORDER — ACYCLOVIR 400 MG PO TABS: 400.0000 mg | ORAL_TABLET | Freq: Two times a day (BID) | ORAL | 3 refills | Status: DC

## 2017-10-01 MED ORDER — TERBINAFINE HCL 250 MG PO TABS: 250.0000 mg | ORAL_TABLET | Freq: Every day | ORAL | Status: DC

## 2017-10-01 NOTE — Patient Instructions (Addendum)
Dear Gail SkainsPatricia L Bass,   It was nice to see you today! I am glad you came in for your concerns. This document serves as a "wrap-up" to all that we discussed today and is listed as follows:    Vaginal Discharge  We did a Papsmear and STD testing. I will call with your results and if positive, I will call in a medication for you to begin.   Call us if you have severe abdominal pain or sores or if you are not better in 1 week after you start treatment. Toe Fungus (Onychomycosis)   You will start a medication called Terbinafine that you will need to take daily for 12 weeks  Please return in 6 weeks for blood work. Please make a lab visit/appointment to get this done.  Depression:  Please see your PCP at your earliest convenience to discuss alternative treatment options for your depression Heavy Periods:  Today we got blood work to look for anemia due to your heavy periods.   Please see you PCP at your earliest convenience to discuss this in more detail.   Orders Placed This Encounter  Procedures  . CBC with Differential  . HIV antibody (with reflex)  . RPR  . Acute Hep Panel & Hep B Surface Ab  . POCT Wet Prep [employer]([place])  . POCT urinalysis dipstick  . POCT UA - Microscopic Only     Thank you for choosing [facility] for your primary care needs and stay well!   Best,  Dr. Orpah CobbKiersten Roselyn Doby, DO Resident Physician [facility] [phone]   Don't forget to sign up for MyChart for instant access to your health profile, labs, orders, upcoming appointments or to contact your provider with questions. Stop at the front desk on the way out for more information about how to sign up!

## 2017-10-01 NOTE — Assessment & Plan Note (Signed)
Patient discontinued Celexa due to weight gain x 2-3 weeks ago. Has noticed returning of depression symptoms, although her new puppy is helping Recommended follow-up with PCP at earliest convenience for further discussion of depression, weight gain, and alternative anti-depressant options.

## 2017-10-01 NOTE — Assessment & Plan Note (Addendum)
Notes heavy periods (soaking through 5 pads per day), although they are regular with no irregular bleeding. Due to symptoms of fatigue, will obtain CBC to rule-out anemia secondary to menorrhagia  Also due to her age, recommended PCP follow-up and further discussion of heavy periods.  Consider endometrial biopsy to rule-out underlying causes for menorrhagia   Consider IUD/birth control options to help with her symptoms

## 2017-10-01 NOTE — Assessment & Plan Note (Signed)
Refilled Acyclovir, per patient request

## 2017-10-01 NOTE — Assessment & Plan Note (Signed)
-  After in depth discussion, patient desired treatment for her right toe onychomycosis.  -Due to failed OTC topical treatment, will begin Terbinafine 250mg  PO x 12 weeks -CBC with diff obtained today for initial liver function  -Patient to return in 6 weeks for lab visit for repeat LFT's (future order)

## 2017-10-01 NOTE — Assessment & Plan Note (Addendum)
Patient was due for pap smear. Performed today. Per patient request, STD testing performed (GC/Chlamydia, Trich, RPR, HIV, acute hep panel) Wet prep obtained Due to s/s of nocturia and recurrent candidal infections, obtained urinalysis to assess for glucosuria   Will contact patient with results.  Due to recurrent BV infections, consider prophylactic treatment if continues to be a problem for patient (hx of BV infections at least 2x/year since 2012).   Prophylactic Tx:  Metronidazole vaginal gel 0.75% 2x/week for 6 months

## 2017-10-01 NOTE — Telephone Encounter (Signed)
Terbinafine was set as a clinic med and not sent to pharmacy. Sent to pharmacy. Patient there waiting. Ples SpecterAlisa Amsi Grimley, RN [facility])

## 2017-10-02 LAB — ACUTE HEP PANEL AND HEP B SURFACE AB
HEP A IGM: NEGATIVE
HEP B C IGM: NEGATIVE
HEP B S AG: NEGATIVE
Hep C Virus Ab: <0.1 s/co ratio (ref 0.0–0.9)
Hepatitis B Surf Ab Quant: <3.1 m[IU]/mL — ABNORMAL LOW (ref >9.9)

## 2017-10-02 LAB — CBC WITH DIFFERENTIAL/PLATELET
BASOS ABS: 0 10*3/uL (ref 0.0–0.2)
BASOS: 0 %
EOS (ABSOLUTE): 0.1 10*3/uL (ref 0.0–0.4)
Eos: 1 %
HEMOGLOBIN: 14 g/dL (ref 11.1–15.9)
Hematocrit: 40.8 % (ref 34.0–46.6)
IMMATURE GRANS (ABS): 0 10*3/uL (ref 0.0–0.1)
IMMATURE GRANULOCYTES: 1 %
Lymphocytes Absolute: 2.2 10*3/uL (ref 0.7–3.1)
Lymphs: 31 %
MCH: 32.3 pg (ref 26.6–33.0)
MCHC: 34.3 g/dL (ref 31.5–35.7)
MCV: 94 fL (ref 79–97)
Monocytes Absolute: 0.5 10*3/uL (ref 0.1–0.9)
Monocytes: 7 %
NEUTROS PCT: 60 %
Neutrophils Absolute: 4.4 10*3/uL (ref 1.4–7.0)
Platelets: 264 10*3/uL (ref 150–450)
RBC: 4.33 x10E6/uL (ref 3.77–5.28)
RDW: 13.1 % (ref 12.3–15.4)
WBC: 7.2 10*3/uL (ref 3.4–10.8)

## 2017-10-02 LAB — RPR: RPR Ser Ql: NONREACTIVE

## 2017-10-02 LAB — HIV ANTIBODY (ROUTINE TESTING W REFLEX): HIV SCREEN 4TH GENERATION: NONREACTIVE

## 2017-10-03 ENCOUNTER — Encounter: Payer: Self-pay | Admitting: Family Medicine

## 2017-10-04 LAB — CERVICOVAGINAL ANCILLARY ONLY
Chlamydia: NEGATIVE
Neisseria Gonorrhea: NEGATIVE

## 2017-10-05 ENCOUNTER — Encounter: Payer: Self-pay | Admitting: *Deleted

## 2017-10-06 LAB — CYTOLOGY - PAP
Diagnosis: NEGATIVE
HPV (WINDOPATH): NOT DETECTED

## 2017-11-12 ENCOUNTER — Other Ambulatory Visit: Payer: Self-pay

## 2017-12-20 ENCOUNTER — Inpatient Hospital Stay (HOSPITAL_COMMUNITY)
Admission: AD | Admit: 2017-12-20 | Discharge: 2017-12-20 | Disposition: A | Discharge status: Home / Self Care | Payer: Medicaid Other | Source: Ambulatory Visit | Attending: Obstetrics and Gynecology | Admitting: Obstetrics and Gynecology

## 2017-12-20 ENCOUNTER — Other Ambulatory Visit: Payer: Self-pay

## 2017-12-20 DIAGNOSIS — Z881 Allergy status to other antibiotic agents status: Secondary | ICD-10-CM | POA: Insufficient documentation

## 2017-12-20 DIAGNOSIS — Z87442 Personal history of urinary calculi: Secondary | ICD-10-CM | POA: Insufficient documentation

## 2017-12-20 DIAGNOSIS — Z8249 Family history of ischemic heart disease and other diseases of the circulatory system: Secondary | ICD-10-CM | POA: Insufficient documentation

## 2017-12-20 DIAGNOSIS — N941 Unspecified dyspareunia: Secondary | ICD-10-CM | POA: Insufficient documentation

## 2017-12-20 DIAGNOSIS — M549 Dorsalgia, unspecified: Secondary | ICD-10-CM | POA: Diagnosis not present

## 2017-12-20 DIAGNOSIS — Z885 Allergy status to narcotic agent status: Secondary | ICD-10-CM | POA: Insufficient documentation

## 2017-12-20 DIAGNOSIS — J45909 Unspecified asthma, uncomplicated: Secondary | ICD-10-CM | POA: Insufficient documentation

## 2017-12-20 DIAGNOSIS — R350 Frequency of micturition: Secondary | ICD-10-CM | POA: Insufficient documentation

## 2017-12-20 DIAGNOSIS — Z791 Long term (current) use of non-steroidal anti-inflammatories (NSAID): Secondary | ICD-10-CM | POA: Diagnosis not present

## 2017-12-20 DIAGNOSIS — Z87891 Personal history of nicotine dependence: Secondary | ICD-10-CM | POA: Diagnosis not present

## 2017-12-20 DIAGNOSIS — I10 Essential (primary) hypertension: Secondary | ICD-10-CM | POA: Insufficient documentation

## 2017-12-20 DIAGNOSIS — N760 Acute vaginitis: Secondary | ICD-10-CM | POA: Insufficient documentation

## 2017-12-20 DIAGNOSIS — Z79899 Other long term (current) drug therapy: Secondary | ICD-10-CM | POA: Insufficient documentation

## 2017-12-20 DIAGNOSIS — B9689 Other specified bacterial agents as the cause of diseases classified elsewhere: Secondary | ICD-10-CM | POA: Insufficient documentation

## 2017-12-20 LAB — URINALYSIS, ROUTINE W REFLEX MICROSCOPIC
BACTERIA UA: NONE SEEN
BILIRUBIN URINE: NEGATIVE
GLUCOSE, UA: NEGATIVE mg/dL
KETONES UR: NEGATIVE mg/dL
Leukocytes, UA: NEGATIVE
NITRITE: NEGATIVE
PROTEIN: NEGATIVE mg/dL
Specific Gravity, Urine: 1.013 (ref 1.005–1.030)
pH: 7 (ref 5.0–8.0)

## 2017-12-20 LAB — POCT PREGNANCY, URINE: PREG TEST UR: NEGATIVE

## 2017-12-20 LAB — WET PREP, GENITAL
Sperm: NONE SEEN
Trich, Wet Prep: NONE SEEN
Yeast Wet Prep HPF POC: NONE SEEN

## 2017-12-20 MED ORDER — CLINDAMYCIN HCL 300 MG PO CAPS: 300.0000 mg | ORAL_CAPSULE | Freq: Two times a day (BID) | ORAL | 0 refills | Status: DC

## 2017-12-20 NOTE — MAU Provider Note (Signed)
History     CSN: [id number]  Arrival date and time: 12/20/17 1423   First Provider Initiated Contact with Patient 12/20/17 1555      Chief Complaint  Patient presents with  . Urinary Frequency  . Back Pain   HPI  Gail Bass is a 42 y.o. female non pregnant here today in MAU with concerns about UTI. She also attests to thick white discharge without odor, and occasional pain during intercourse which started 2 weeks ago. No new sexual partners. Wanted to make sure she does not have a UTI or an STI today.   OB History   None     Past Medical History:  Diagnosis Date  . Asthma   . Bacterial vaginosis   . BV (bacterial vaginosis) 09/13/2008       . Hypertension 05/11/2013  . Kidney stone   . Pain in the chest 06/06/2015  . Painful lumpy right breast 03/21/2013  . Recurrent sinusitis 06/06/2015  . TUBAL PREGNANCY 09/13/2008   Qualifier: History of  By: Lanier Prude  MD, Cathrine Muster    . Vaginal discharge 01/10/2014    Past Surgical History:  Procedure Laterality Date  . CYSTECTOMY    . CYSTOSCOPY WITH RETROGRADE PYELOGRAM, URETEROSCOPY AND STENT PLACEMENT Left 02/22/2014   Procedure: CYSTOSCOPY WITH RETROGRADE PYELOGRAM, URETEROSCOPY, STONE BASKET EXTRACTION ;  Surgeon: Kathi Ludwig, MD;  Location: [facility];  Service: Urology;  Laterality: Left;  . tailbone cyst    . TUBAL LIGATION      Family History  Problem Relation Age of Onset  . Arrhythmia Father        Pacemakers placed didn't take passed away 3 moths later  . Arrhythmia Sister        Pacemaker didn't take to body passed away 3 months ;later  . Hypertension Maternal Aunt   . Heart failure Maternal Grandmother   . Hypertension Maternal Grandfather   . Diabetes Other   . CAD Other     Social History   Tobacco Use  . Smoking status: Former Smoker    Start date: 11/19/2012    Last attempt to quit: 08/01/2015    Years since quitting: 2.3  . Smokeless tobacco: Never Used  Substance Use Topics  . Alcohol  use: No  . Drug use: Yes    Types: Marijuana    Allergies:  Allergies  Allergen Reactions  . Doxycycline     REACTION: Nausea  . Lorcet [Hydrocodone-Acetaminophen] Nausea And Vomiting and Rash    Medications Prior to Admission  Medication Sig Dispense Refill Last Dose  . acyclovir (ZOVIRAX) 400 MG tablet Take 1 tablet (400 mg total) by mouth 2 (two) times daily. 180 tablet 3   . albuterol (PROVENTIL HFA;VENTOLIN HFA) 108 (90 Base) MCG/ACT inhaler Inhale 2 puffs into the lungs every 4 (four) hours as needed for wheezing or shortness of breath. 1 Inhaler 3 Taking  . cetirizine (ZYRTEC) 10 MG tablet Take 1 tablet (10 mg total) by mouth daily. 90 tablet 1 Taking  . ibuprofen (ADVIL,MOTRIN) 800 MG tablet Take 1 tablet (800 mg total) by mouth every 8 (eight) hours as needed. 30 tablet 1 Taking  . Lysine 1000 MG TABS Take 1 tablet (1,000 mg total) by mouth daily.  0 Taking  . PROAIR HFA 108 (90 Base) MCG/ACT inhaler inhale 2 puffs by mouth every 4 hours if needed for wheezing or shortness of breath 8.5 g 3 Taking  . terbinafine (LAMISIL) 250 MG tablet Take 1 tablet (250  mg total) by mouth daily. 30 tablet 2    Results for orders placed or performed during the hospital encounter of 12/20/17 (from the past 72 hour(s))  GC/Chlamydia probe amp ([place])not at [facility]     Status: None   Collection Time: 12/20/17 12:00 AM  Result Value Ref Range   Chlamydia Negative     Comment: Normal Reference Range - Negative   Neisseria gonorrhea Negative     Comment: Normal Reference Range - Negative  Urinalysis, Routine w reflex microscopic     Status: Abnormal   Collection Time: 12/20/17  2:48 PM  Result Value Ref Range   Color, Urine YELLOW YELLOW   APPearance HAZY (A) CLEAR   Specific Gravity, Urine 1.013 1.005 - 1.030   pH 7.0 5.0 - 8.0   Glucose, UA NEGATIVE NEGATIVE mg/dL   Hgb urine dipstick SMALL (A) NEGATIVE   Bilirubin Urine NEGATIVE NEGATIVE   Ketones, ur NEGATIVE NEGATIVE mg/dL    Protein, ur NEGATIVE NEGATIVE mg/dL   Nitrite NEGATIVE NEGATIVE   Leukocytes, UA NEGATIVE NEGATIVE   RBC / HPF 0-5 0 - 5 RBC/hpf   WBC, UA 0-5 0 - 5 WBC/hpf   Bacteria, UA NONE SEEN NONE SEEN   Squamous Epithelial / LPF 6-10 0 - 5   Mucus PRESENT     Comment: Performed at [facility], [address], [place], [place] [address]  Pregnancy, urine POC     Status: None   Collection Time: 12/20/17  2:51 PM  Result Value Ref Range   Preg Test, Ur NEGATIVE NEGATIVE    Comment:        THE SENSITIVITY OF THIS METHODOLOGY IS >24 mIU/mL   Wet prep, genital     Status: Abnormal   Collection Time: 12/20/17  3:13 PM  Result Value Ref Range   Yeast Wet Prep HPF POC NONE SEEN NONE SEEN   Trich, Wet Prep NONE SEEN NONE SEEN   Clue Cells Wet Prep HPF POC PRESENT (A) NONE SEEN   WBC, Wet Prep HPF POC FEW (A) NONE SEEN    Comment: MANY BACTERIA SEEN   Sperm NONE SEEN     Comment: Performed at [facility], [address], [place], [place] [address]    Review of Systems  Constitutional: Negative for fever.  Gastrointestinal: Negative for abdominal pain.  Genitourinary: Positive for frequency, urgency and vaginal discharge. Negative for dysuria, flank pain, hematuria and vaginal bleeding.   Physical Exam   Blood pressure 134/87, temperature 98.2 F (36.8 C), temperature source Oral, resp. rate 18, height 5\' 2"  (1.575 m), weight 78 kg, SpO2 97 %.  Physical Exam  Constitutional: She is oriented to person, place, and time. She appears well-developed and well-nourished. No distress.  Eyes: Pupils are equal, round, and reactive to light.  Neck: Normal range of motion.  Respiratory: Effort normal.  Musculoskeletal: Normal range of motion.  Neurological: She is alert and oriented to person, place, and time.  Skin: Skin is warm. She is not diaphoretic.  Psychiatric: Her behavior is normal.   MAU Course  Procedures  MDM  Wet prep and GC Urine with no signs of UTI  Assessment and  Plan   A:  1. Dyspareunia, female   2. Bacterial vaginosis     P:  Discharge home in stable condition Return to MAU for emergencies only Rx: Flagyl; no alcohol Condoms always.   Venia Carbon I, NP 12/22/2017 8:25 AM

## 2017-12-20 NOTE — MAU Note (Signed)
Pt presents to MAU with c/o urinary frequency and back pain x 2 weeks. Pt has had no urinary pain just frequency. She has been with the same partner for 2 years but does have concerns about STD's due to painful intercourse.

## 2017-12-20 NOTE — Discharge Instructions (Signed)

## 2017-12-21 LAB — GC/CHLAMYDIA PROBE AMP (~~LOC~~) NOT AT ARMC
Chlamydia: NEGATIVE
Neisseria Gonorrhea: NEGATIVE

## 2017-12-24 ENCOUNTER — Ambulatory Visit: Payer: Self-pay | Admitting: Family Medicine

## 2018-04-15 ENCOUNTER — Other Ambulatory Visit (HOSPITAL_COMMUNITY)
Admission: RE | Admit: 2018-04-15 | Discharge: 2018-04-15 | Disposition: A | Discharge status: Home / Self Care | Payer: Medicaid Other | Source: Ambulatory Visit | Attending: Family Medicine | Admitting: Family Medicine

## 2018-04-15 ENCOUNTER — Ambulatory Visit (INDEPENDENT_AMBULATORY_CARE_PROVIDER_SITE_OTHER): Payer: Medicaid Other | Admitting: Family Medicine

## 2018-04-15 VITALS — BP 123/80 | HR 84 | Temp 98.4°F | Wt 172.2 lb

## 2018-04-15 DIAGNOSIS — L03818 Cellulitis of other sites: Secondary | ICD-10-CM | POA: Diagnosis not present

## 2018-04-15 DIAGNOSIS — Z113 Encounter for screening for infections with a predominantly sexual mode of transmission: Secondary | ICD-10-CM

## 2018-04-15 LAB — POCT WET PREP (WET MOUNT)
Clue Cells Wet Prep Whiff POC: NEGATIVE
TRICHOMONAS WET PREP HPF POC: ABSENT

## 2018-04-15 MED ORDER — CLINDAMYCIN HCL 300 MG PO CAPS: 300.0000 mg | ORAL_CAPSULE | Freq: Two times a day (BID) | ORAL | 0 refills | Status: DC

## 2018-04-15 NOTE — Patient Instructions (Signed)
It was great meeting you today!  I am sorry had so much trouble with this boil on your clitoris.  I think this is likely a hair follicle that got infected.  I will start you on an antibiotic called clindamycin.  You will take this 2 times per day for neck 7 days.  Please stop applying home remedies to your area.  We performed a wet prep today.  This was negative for anything.  It is unclear if this is because it was treated already by the metronidazole gel or if there was nothing they are doing with.  We can consider this treated though.  I will call you with results of your gonorrhea and chlamydia screen.

## 2018-04-18 ENCOUNTER — Encounter: Payer: Self-pay | Admitting: Family Medicine

## 2018-04-18 DIAGNOSIS — L0390 Cellulitis, unspecified: Secondary | ICD-10-CM | POA: Insufficient documentation

## 2018-04-18 LAB — CERVICOVAGINAL ANCILLARY ONLY
CHLAMYDIA, DNA PROBE: NEGATIVE
NEISSERIA GONORRHEA: NEGATIVE

## 2018-04-18 NOTE — Progress Notes (Signed)
   HPI 43 year old female who presents for a "boil" on the lateral aspect of her clitoris.  Patient states that she first noticed this about a week ago.  She states that she did shave in that area and that she first noticed this after shaving.  She thought it may have been an ingrown hair secondary to her shaving.  She tried a number of home remedies including rubbing apple cider vinegar on this area, and Neosporin.  Apple cider vinegar turned this area white but did not make it received in any way.  He feels it has been slowly increasing in size and is intermittently painful.  Patient is also interested in STD testing for gonorrhea and chlamydia.  She also desires a wet prep.  Wet prep performed in office, no abnormality seen.  Note patient felt that she has had itching vaginal area.  She had left over metronidazole gel which she has applied for the last 3 days.   CC: STD testing and clitoral boil   ROS:   Review of Systems See HPI for ROS.   CC, SH/smoking status, and VS noted  Objective: BP 123/80   Pulse 84   Temp 98.4 F (36.9 C) (Oral)   Wt 172 lb 3.2 oz (78.1 kg)   SpO2 98%   BMI 31.50 kg/m  Gen: Very pleasant 43 year old Caucasian female Abd: SNTND, BS present, no guarding or organomegaly GU: Half centimeter by half centimeter raised hypopigmented nonmobile lesion.  Surrounding erythema noted.  Slightly painful to touch. Neuro: Alert and oriented, Speech clear, No gross deficits   Assessment and plan:  Screen for STD (sexually transmitted disease) GC/chlamydia performed.  Wet prep performed which was negative.  Unclear if she actually did have some pathology and was treated by the metronidazole gel or if there was no pathology to begin with.  Cellulitis Clinical findings consistent with cellulitis.  Likely secondary to an ingrown hair which became infected.  Her home remedies likely did not help.  Will treat with oral clindamycin 300 mg twice daily for 7 days.  Patient  to follow-up if does not resolve. Given that the area was relatively painless and there is only 1, felt to be unlikely to be herpes.  Likely too small to be a chancre associated with secondary syphilis.   Orders Placed This Encounter  Procedures  . POCT Wet Prep [facility])    Meds ordered this encounter  Medications  . clindamycin (CLEOCIN) 300 MG capsule    Sig: Take 1 capsule (300 mg total) by mouth 2 (two) times daily.    Dispense:  14 capsule    Refill:  0     Myrene Buddy MD PGY-2 Family Medicine Resident  04/18/2018 10:24 AM

## 2018-04-18 NOTE — Assessment & Plan Note (Signed)
GC/chlamydia performed.  Wet prep performed which was negative.  Unclear if she actually did have some pathology and was treated by the metronidazole gel or if there was no pathology to begin with.

## 2018-04-18 NOTE — Assessment & Plan Note (Signed)
Clinical findings consistent with cellulitis.  Likely secondary to an ingrown hair which became infected.  Her home remedies likely did not help.  Will treat with oral clindamycin 300 mg twice daily for 7 days.  Patient to follow-up if does not resolve. Given that the area was relatively painless and there is only 1, felt to be unlikely to be herpes.  Likely too small to be a chancre associated with secondary syphilis.

## 2018-04-25 ENCOUNTER — Ambulatory Visit: Payer: Self-pay | Attending: Internal Medicine | Admitting: Internal Medicine

## 2018-04-25 ENCOUNTER — Encounter: Payer: Self-pay | Admitting: Internal Medicine

## 2018-04-25 VITALS — BP 150/98 | HR 80 | Temp 98.1°F | Resp 16 | Ht 62.0 in | Wt 174.2 lb

## 2018-04-25 DIAGNOSIS — R0981 Nasal congestion: Secondary | ICD-10-CM | POA: Insufficient documentation

## 2018-04-25 DIAGNOSIS — G43009 Migraine without aura, not intractable, without status migrainosus: Secondary | ICD-10-CM

## 2018-04-25 DIAGNOSIS — B001 Herpesviral vesicular dermatitis: Secondary | ICD-10-CM

## 2018-04-25 DIAGNOSIS — R030 Elevated blood-pressure reading, without diagnosis of hypertension: Secondary | ICD-10-CM

## 2018-04-25 HISTORY — DX: Nasal congestion: R09.81

## 2018-04-25 MED ORDER — ACYCLOVIR 400 MG PO TABS: 400.0000 mg | ORAL_TABLET | Freq: Two times a day (BID) | ORAL | 3 refills | Status: DC

## 2018-04-25 MED ORDER — FLUTICASONE PROPIONATE 50 MCG/ACT NA SUSP: NASAL | 1 refills | Status: DC

## 2018-04-25 MED FILL — ACYCLOVIR 400 MG TABLET: 400 | 30 days supply | Qty: 60 | Fill #0

## 2018-04-25 NOTE — Progress Notes (Signed)
Patient ID: ROSMARY DIONISIO, female    DOB: 1976-01-19  MRN: [id number]  CC: New Patient (Initial Visit)   Subjective: Betha Shadix is a 43 y.o. female who presents for new pt visit Her concerns today include:  Patient with history of depression, recurrent cold sores, migraine headache, intermittent asthma   Was going to [facility] but loss Medicaid last mth.  Came today so that she can apply for [employer].   Request RF on Acyclovir which she takes for recurrent herpes labialis.  Was on it for 2 yrs.  Without it, she gets flare Q 2 wks. Out of med x 2 wks.  C/o having a boil or ingrown hair on RT side of clitoris.  Seen by family medicine resident on the 17th of this month for the same.  Treated with Clinda x 7 days for mild cellulitis in this area.  The lump has significantly decreased in size and is no longer painful    BP noted to be elevated today.  Pt thinks due to migraine.  Gets migraines about twice a month.  Occurs around the time of her menses or with increased stress and sometimes when she has outbreaks of herpes labialis.  Headaches are usually frontal and associated with photophobia, phenyl phobia and nausea.  No blurred vision.  No aura.  She takes ibuprofen 800 mg or sometimes Tylenol and gets relief within 30 minutes to 1 hour.  Reportedly tried with Imitrex in the past but did not tolerate it.  She took ibuprofen today  Social history, family history past surgical history reviewed Patient Active Problem List   Diagnosis Date Noted  . Cellulitis 04/18/2018  . Onychomycosis of great toe 10/01/2017  . Screen for STD (sexually transmitted disease) 12/05/2015  . Fatigue 12/05/2015  . Recurrent cold sores 12/05/2015  . PTSD (post-traumatic stress disorder) 12/05/2015  . Folliculitis 11/12/2015  . Impetigo bullosa 08/29/2015  . External hemorrhoids 08/07/2015  . Vaginal discharge 01/10/2014  . Hair loss 05/02/2013  . Former smoker  03/21/2013  . ASTHMA, INTERMITTENT 12/23/2009  . OVARIAN CYST, LEFT 10/29/2008  . MIGRAINE HEADACHE 09/13/2008  . MENORRHAGIA 09/13/2008  . Depression 06/21/2008     Current Outpatient Medications on File Prior to Visit  Medication Sig Dispense Refill  . albuterol (PROVENTIL HFA;VENTOLIN HFA) 108 (90 Base) MCG/ACT inhaler Inhale 2 puffs into the lungs every 4 (four) hours as needed for wheezing or shortness of breath. 1 Inhaler 3  . cetirizine (ZYRTEC) 10 MG tablet Take 1 tablet (10 mg total) by mouth daily. 90 tablet 1  . clindamycin (CLEOCIN) 300 MG capsule Take 1 capsule (300 mg total) by mouth 2 (two) times daily. (Patient not taking: Reported on 04/25/2018) 14 capsule 0  . ibuprofen (ADVIL,MOTRIN) 800 MG tablet Take 1 tablet (800 mg total) by mouth every 8 (eight) hours as needed. 30 tablet 1  . Lysine 1000 MG TABS Take 1 tablet (1,000 mg total) by mouth daily.  0  . PROAIR HFA 108 (90 Base) MCG/ACT inhaler inhale 2 puffs by mouth every 4 hours if needed for wheezing or shortness of breath 8.5 g 3  . terbinafine (LAMISIL) 250 MG tablet Take 1 tablet (250 mg total) by mouth daily. 30 tablet 2   No current facility-administered medications on file prior to visit.     Allergies  Allergen Reactions  . Doxycycline     REACTION: Nausea  . Lorcet [Hydrocodone-Acetaminophen] Nausea And Vomiting and Rash  Social History   Socioeconomic History  . Marital status: Single    Spouse name: Not on file  . Number of children: Not on file  . Years of education: Not on file  . Highest education level: Not on file  Occupational History  . Not on file  Social Needs  . Financial resource strain: Not on file  . Food insecurity:    Worry: Not on file    Inability: Not on file  . Transportation needs:    Medical: Not on file    Non-medical: Not on file  Tobacco Use  . Smoking status: Former Smoker    Start date: 11/19/2012    Last attempt to quit: 08/01/2015    Years since quitting: 2.7    . Smokeless tobacco: Never Used  Substance and Sexual Activity  . Alcohol use: No  . Drug use: Yes    Types: Marijuana  . Sexual activity: Yes    Partners: Male    Birth control/protection: Condom  Lifestyle  . Physical activity:    Days per week: Not on file    Minutes per session: Not on file  . Stress: Not on file  Relationships  . Social connections:    Talks on phone: Not on file    Gets together: Not on file    Attends religious service: Not on file    Active member of club or organization: Not on file    Attends meetings of clubs or organizations: Not on file    Relationship status: Not on file  . Intimate partner violence:    Fear of current or ex partner: Not on file    Emotionally abused: Not on file    Physically abused: Not on file    Forced sexual activity: Not on file  Other Topics Concern  . Not on file  Social History Narrative   Has 3 children and one adopted daughter.   Works.    [employer].   Mother lives in [place]. They are estranged because her mother practices withcraft.     Family History  Problem Relation Age of Onset  . Arrhythmia Father        Pacemakers placed didn't take passed away 3 moths later  . Arrhythmia Sister        Pacemaker didn't take to body passed away 3 months ;later  . Hypertension Maternal Aunt   . Heart failure Maternal Grandmother   . Hypertension Maternal Grandfather   . Diabetes Other   . CAD Other     Past Surgical History:  Procedure Laterality Date  . CYSTECTOMY    . CYSTOSCOPY WITH RETROGRADE PYELOGRAM, URETEROSCOPY AND STENT PLACEMENT Left 02/22/2014   Procedure: CYSTOSCOPY WITH RETROGRADE PYELOGRAM, URETEROSCOPY, STONE BASKET EXTRACTION ;  Surgeon: Kathi LudwigSigmund I Tannenbaum, MD;  Location: [facility];  Service: Urology;  Laterality: Left;  . tailbone cyst    . TUBAL LIGATION      ROS: Review of Systems  HENT:       Complains of intermittent sinus congestion.  She is on Zyrtec.  Reports being on nasal spray in the  past but does not recall the name   Negative except as stated above  PHYSICAL EXAM: BP (!) 150/98   Pulse 80   Temp 98.1 F (36.7 C) (Oral)   Resp 16   Ht 5\' 2"  (1.575 m)   Wt 174 lb 3.2 oz (79 kg)   SpO2 97%   BMI 31.86 kg/m   BP 150/85 Physical  Exam  General appearance - alert, well appearing, middle-aged Caucasian female and in no distress Mental status - normal mood, behavior, speech, dress, motor activity, and thought processes Nose - normal and patent, no erythema, discharge or polyps Mouth -no cold sores noted on the lips at this time Neck - supple, no significant adenopathy Chest - clear to auscultation, no wheezes, rales or rhonchi, symmetric air entry Heart - normal rate, regular rhythm, normal S1, S2, no murmurs, rubs, clicks or gallops Pelvic -CMA Pollock present: Very tiny bump remains to the right of the clitoris.  There is no erythema no fluctuance.  It is not tender to touch Extremities - peripheral pulses normal, no pedal edema, no clubbing or cyanosis Neurologic- cranial nerves intact.  Power 5/5 bilaterally.  Gross sensation intact.  Reflexes normal.  CMP Latest Ref Rng & Units 12/27/2015 06/05/2015 02/22/2014  Glucose 65 - 99 mg/dL 91 90 956(O)  BUN 7 - 25 mg/dL 14 11 17   Creatinine 0.50 - 1.10 mg/dL 1.30 8.65 7.84  Sodium 135 - 146 mmol/L 139 141 139  Potassium 3.5 - 5.3 mmol/L 4.4 3.7 3.6  Chloride 98 - 110 mmol/L 104 106 106  CO2 20 - 31 mmol/L 26 24 26   Calcium 8.6 - 10.2 mg/dL 9.3 8.9 9.2  Total Protein 6.1 - 8.1 g/dL - 6.6 7.4  Total Bilirubin 0.2 - 1.2 mg/dL - 0.5 0.7  Alkaline Phos 33 - 115 U/L - 53 48  AST 10 - 30 U/L - 13 17  ALT 6 - 29 U/L - 25 17   Lipid Panel  No results found for: CHOL, TRIG, HDL, CHOLHDL, VLDL, LDLCALC, LDLDIRECT  CBC    Component Value Date/Time   WBC 7.2 10/01/2017 1141   WBC 7.9 12/27/2015 1024   RBC 4.33 10/01/2017 1141   RBC 4.49 12/27/2015 1024   HGB 14.0 10/01/2017 1141   HCT 40.8 10/01/2017 1141   PLT  264 10/01/2017 1141   MCV 94 10/01/2017 1141   MCH 32.3 10/01/2017 1141   MCH 32.7 12/27/2015 1024   MCHC 34.3 10/01/2017 1141   MCHC 34.6 12/27/2015 1024   RDW 13.1 10/01/2017 1141   LYMPHSABS 2.2 10/01/2017 1141   MONOABS 380 06/05/2015 0917   EOSABS 0.1 10/01/2017 1141   BASOSABS 0.0 10/01/2017 1141    ASSESSMENT AND PLAN: 1. Recurrent cold sores Refill given on acyclovir - acyclovir (ZOVIRAX) 400 MG tablet; Take 1 tablet (400 mg total) by mouth 2 (two) times daily.  Dispense: 180 tablet; Refill: 3  2. Elevated blood pressure reading Patient may be sensitive to ibuprofen.  I recommend that she try taking Tylenol 500 mg instead for her headaches.  DASH diet discussed and encouraged.  Follow-up with clinical pharmacist in 3 weeks for repeat blood pressure check  3. Migraine without aura and without status migrainosus, not intractable See #2 above.  Reports good relief of headaches with ibuprofen or Tylenol.  Advised that she use the Tylenol as the ibuprofen may be contributing to elevated blood pressure  4. Sinus congestion Continue Zyrtec.  I have given Flonase to use as needed.  Advised her not to use every day as it can cause nosebleeds - fluticasone (FLONASE) 50 MCG/ACT nasal spray; 1 spray in each nostril daily as needed for sinus congestion  Dispense: 16 g; Refill: 1  5.  Reassurance given about the lump that was now at the clitoris.  This has by her report significantly decreased in size and is no longer red  or painful.  No need for additional antibiotics at this time  Patient was given the opportunity to ask questions.  Patient verbalized understanding of the plan and was able to repeat key elements of the plan.   No orders of the defined types were placed in this encounter.    Requested Prescriptions   Signed Prescriptions Disp Refills  . acyclovir (ZOVIRAX) 400 MG tablet 180 tablet 3    Sig: Take 1 tablet (400 mg total) by mouth 2 (two) times daily.  . fluticasone  (FLONASE) 50 MCG/ACT nasal spray 16 g 1    Sig: 1 spray in each nostril daily as needed for sinus congestion    Return if symptoms worsen or fail to improve.  Jonah Blue, MD, FACP

## 2018-04-25 NOTE — Patient Instructions (Signed)
Please give patient an appointment with the clinical pharmacist in 3 weeks for repeat blood pressure check   Discontinue the ibuprofen.  Use Tylenol 500 mg as needed for migraine headaches. Try to limit salt in the foods as much as possible.   DASH Eating Plan DASH stands for "Dietary Approaches to Stop Hypertension." The DASH eating plan is a healthy eating plan that has been shown to reduce high blood pressure (hypertension). It may also reduce your risk for type 2 diabetes, heart disease, and stroke. The DASH eating plan may also help with weight loss. What are tips for following this plan?  General guidelines  Avoid eating more than 2,300 mg (milligrams) of salt (sodium) a day. If you have hypertension, you may need to reduce your sodium intake to 1,500 mg a day.  Limit alcohol intake to no more than 1 drink a day for nonpregnant women and 2 drinks a day for men. One drink equals 12 oz of beer, 5 oz of wine, or 1 oz of hard liquor.  Work with your health care provider to maintain a healthy body weight or to lose weight. Ask what an ideal weight is for you.  Get at least 30 minutes of exercise that causes your heart to beat faster (aerobic exercise) most days of the week. Activities may include walking, swimming, or biking.  Work with your health care provider or diet and nutrition specialist (dietitian) to adjust your eating plan to your individual calorie needs. Reading food labels   Check food labels for the amount of sodium per serving. Choose foods with less than 5 percent of the Daily Value of sodium. Generally, foods with less than 300 mg of sodium per serving fit into this eating plan.  To find whole grains, look for the word "whole" as the first word in the ingredient list. Shopping  Buy products labeled as "low-sodium" or "no salt added."  Buy fresh foods. Avoid canned foods and premade or frozen meals. Cooking  Avoid adding salt when cooking. Use salt-free seasonings  or herbs instead of table salt or sea salt. Check with your health care provider or pharmacist before using salt substitutes.  Do not fry foods. Cook foods using healthy methods such as baking, boiling, grilling, and broiling instead.  Cook with heart-healthy oils, such as olive, canola, soybean, or sunflower oil. Meal planning  Eat a balanced diet that includes: ? 5 or more servings of fruits and vegetables each day. At each meal, try to fill half of your plate with fruits and vegetables. ? Up to 6-8 servings of whole grains each day. ? Less than 6 oz of lean meat, poultry, or fish each day. A 3-oz serving of meat is about the same size as a deck of cards. One egg equals 1 oz. ? 2 servings of low-fat dairy each day. ? A serving of nuts, seeds, or beans 5 times each week. ? Heart-healthy fats. Healthy fats called Omega-3 fatty acids are found in foods such as flaxseeds and coldwater fish, like sardines, salmon, and mackerel.  Limit how much you eat of the following: ? Canned or prepackaged foods. ? Food that is high in trans fat, such as fried foods. ? Food that is high in saturated fat, such as fatty meat. ? Sweets, desserts, sugary drinks, and other foods with added sugar. ? Full-fat dairy products.  Do not salt foods before eating.  Try to eat at least 2 vegetarian meals each week.  Eat more home-cooked food  and less restaurant, buffet, and fast food.  When eating at a restaurant, ask that your food be prepared with less salt or no salt, if possible. What foods are recommended? The items listed may not be a complete list. Talk with your dietitian about what dietary choices are best for you. Grains Whole-grain or whole-wheat bread. Whole-grain or whole-wheat pasta. Brown rice. [place]. Bulgur. Whole-grain and low-sodium cereals. Pita bread. Low-fat, low-sodium crackers. Whole-wheat flour tortillas. Vegetables Fresh or frozen vegetables (raw, steamed, roasted, or grilled).  Low-sodium or reduced-sodium tomato and vegetable juice. Low-sodium or reduced-sodium tomato sauce and tomato paste. Low-sodium or reduced-sodium canned vegetables. Fruits All fresh, dried, or frozen fruit. Canned fruit in natural juice (without added sugar). Meat and other protein foods Skinless chicken or [place]. Ground chicken or [place]. Pork with fat trimmed off. Fish and seafood. Egg whites. Dried beans, peas, or lentils. Unsalted nuts, nut butters, and seeds. Unsalted canned beans. Lean cuts of beef with fat trimmed off. Low-sodium, lean deli meat. Dairy Low-fat (1%) or fat-free (skim) milk. Fat-free, low-fat, or reduced-fat cheeses. Nonfat, low-sodium ricotta or cottage cheese. Low-fat or nonfat yogurt. Low-fat, low-sodium cheese. Fats and oils Soft margarine without trans fats. Vegetable oil. Low-fat, reduced-fat, or light mayonnaise and salad dressings (reduced-sodium). Canola, safflower, olive, soybean, and sunflower oils. Avocado. Seasoning and other foods Herbs. Spices. Seasoning mixes without salt. Unsalted popcorn and pretzels. Fat-free sweets. What foods are not recommended? The items listed may not be a complete list. Talk with your dietitian about what dietary choices are best for you. Grains Baked goods made with fat, such as croissants, muffins, or some breads. Dry pasta or rice meal packs. Vegetables Creamed or fried vegetables. Vegetables in a cheese sauce. Regular canned vegetables (not low-sodium or reduced-sodium). Regular canned tomato sauce and paste (not low-sodium or reduced-sodium). Regular tomato and vegetable juice (not low-sodium or reduced-sodium). Angie Fava. Olives. Fruits Canned fruit in a light or heavy syrup. Fried fruit. Fruit in cream or butter sauce. Meat and other protein foods Fatty cuts of meat. Ribs. Fried meat. Berniece Salines. Sausage. Bologna and other processed lunch meats. Salami. Fatback. Hotdogs. Bratwurst. Salted nuts and seeds. Canned beans with added  salt. Canned or smoked fish. Whole eggs or egg yolks. Chicken or [place] with skin. Dairy Whole or 2% milk, cream, and half-and-half. Whole or full-fat cream cheese. Whole-fat or sweetened yogurt. Full-fat cheese. Nondairy creamers. Whipped toppings. Processed cheese and cheese spreads. Fats and oils Butter. Stick margarine. Lard. Shortening. Ghee. Bacon fat. Tropical oils, such as coconut, palm kernel, or palm oil. Seasoning and other foods Salted popcorn and pretzels. Onion salt, garlic salt, seasoned salt, table salt, and sea salt. Worcestershire sauce. Tartar sauce. Barbecue sauce. Teriyaki sauce. Soy sauce, including reduced-sodium. Steak sauce. Canned and packaged gravies. Fish sauce. Oyster sauce. Cocktail sauce. Horseradish that you find on the shelf. Ketchup. Mustard. Meat flavorings and tenderizers. Bouillon cubes. Hot sauce and Tabasco sauce. Premade or packaged marinades. Premade or packaged taco seasonings. Relishes. Regular salad dressings. Where to find more information:  National Heart, Lung, and [place]: https://wilson-eaton.com/  American Heart Association: www.heart.org Summary  The DASH eating plan is a healthy eating plan that has been shown to reduce high blood pressure (hypertension). It may also reduce your risk for type 2 diabetes, heart disease, and stroke.  With the DASH eating plan, you should limit salt (sodium) intake to 2,300 mg a day. If you have hypertension, you may need to reduce your sodium intake to 1,500 mg a  day.  When on the DASH eating plan, aim to eat more fresh fruits and vegetables, whole grains, lean proteins, low-fat dairy, and heart-healthy fats.  Work with your health care provider or diet and nutrition specialist (dietitian) to adjust your eating plan to your individual calorie needs. This information is not intended to replace advice given to you by your health care provider. Make sure you discuss any questions you have with your health care  provider. Document Released: 02/05/2011 Document Revised: 02/10/2016 Document Reviewed: 02/10/2016 Elsevier Interactive Patient Education  2019 [employer].

## 2018-04-25 NOTE — Progress Notes (Signed)
Pt states she was dx with a type of herpes and she wasn't informed of what type of herpes   Pt states she has a headache to where it is making her sick

## 2018-04-26 MED FILL — FLUTICASONE PROP 50 MCG SPR: 50 | 16 days supply | Qty: 16 | Fill #0

## 2018-05-11 ENCOUNTER — Other Ambulatory Visit: Payer: Self-pay

## 2018-05-11 ENCOUNTER — Ambulatory Visit: Payer: Self-pay | Attending: Family Medicine

## 2018-05-20 ENCOUNTER — Encounter: Payer: Self-pay | Admitting: Pharmacist

## 2018-12-26 ENCOUNTER — Other Ambulatory Visit: Payer: Self-pay | Admitting: Family Medicine

## 2018-12-26 DIAGNOSIS — B001 Herpesviral vesicular dermatitis: Secondary | ICD-10-CM

## 2019-01-30 ENCOUNTER — Encounter (HOSPITAL_COMMUNITY): Payer: Self-pay

## 2019-01-30 ENCOUNTER — Other Ambulatory Visit: Payer: Self-pay

## 2019-01-30 ENCOUNTER — Ambulatory Visit (HOSPITAL_COMMUNITY)
Admission: EM | Admit: 2019-01-30 | Discharge: 2019-01-30 | Disposition: A | Discharge status: Home / Self Care | Payer: Medicaid Other | Attending: Family Medicine | Admitting: Family Medicine

## 2019-01-30 DIAGNOSIS — Z113 Encounter for screening for infections with a predominantly sexual mode of transmission: Secondary | ICD-10-CM | POA: Insufficient documentation

## 2019-01-30 DIAGNOSIS — R109 Unspecified abdominal pain: Secondary | ICD-10-CM

## 2019-01-30 LAB — POCT URINALYSIS DIP (DEVICE)
Bilirubin Urine: NEGATIVE
Glucose, UA: NEGATIVE mg/dL
Ketones, ur: NEGATIVE mg/dL
Leukocytes,Ua: NEGATIVE
Nitrite: NEGATIVE
Protein, ur: NEGATIVE mg/dL
Specific Gravity, Urine: 1.02 (ref 1.005–1.030)
Urobilinogen, UA: 0.2 mg/dL (ref 0.0–1.0)
pH: 5.5 (ref 5.0–8.0)

## 2019-01-30 LAB — HIV ANTIBODY (ROUTINE TESTING W REFLEX): HIV Screen 4th Generation wRfx: NONREACTIVE

## 2019-01-30 MED ORDER — CLINDAMYCIN HCL 300 MG PO CAPS: 300.0000 mg | ORAL_CAPSULE | Freq: Two times a day (BID) | ORAL | 0 refills | Status: AC

## 2019-01-30 NOTE — ED Triage Notes (Signed)
Pt presents with bilateral flank pain, urinary urgency, urinary frequency, and pelvic pain for over a month.

## 2019-01-30 NOTE — ED Provider Notes (Signed)
[facility]    CSN: [id number] Arrival date & time: 01/30/19  1618      History   Chief Complaint Chief Complaint  Patient presents with  . Flank Pain  . Urinary Tract Infection    HPI Gail Bass is a 43 y.o. female.   The history is provided by the patient. No language interpreter was used.  Flank Pain This is a new problem. The current episode started more than 1 week ago. The problem occurs constantly. The problem has not changed since onset.Associated symptoms include abdominal pain and headaches. Pertinent negatives include no chest pain and no shortness of breath. The symptoms are aggravated by sneezing, coughing, intercourse, stress and smoking. The symptoms are relieved by NSAIDs. She has tried acetaminophen for the symptoms. The treatment provided no relief.  Urinary Tract Infection Associated symptoms: abdominal pain and flank pain     Past Medical History:  Diagnosis Date  . Asthma   . Bacterial vaginosis   . BV (bacterial vaginosis) 09/13/2008       . Hypertension 05/11/2013  . Kidney stone   . Pain in the chest 06/06/2015  . Painful lumpy right breast 03/21/2013  . Recurrent sinusitis 06/06/2015  . TUBAL PREGNANCY 09/13/2008   Qualifier: History of  By: Lanier PrudeBolden  MD, Cathrine Musteraineisha    . Vaginal discharge 01/10/2014    Patient Active Problem List   Diagnosis Date Noted  . Sinus congestion 04/25/2018  . Screen for STD (sexually transmitted disease) 12/05/2015  . Recurrent cold sores 12/05/2015  . PTSD (post-traumatic stress disorder) 12/05/2015  . External hemorrhoids 08/07/2015  . Former smoker 03/21/2013  . ASTHMA, INTERMITTENT 12/23/2009  . OVARIAN CYST, LEFT 10/29/2008  . MIGRAINE HEADACHE 09/13/2008  . MENORRHAGIA 09/13/2008  . Depression 06/21/2008    Past Surgical History:  Procedure Laterality Date  . CYSTECTOMY    . CYSTOSCOPY WITH RETROGRADE PYELOGRAM, URETEROSCOPY AND STENT PLACEMENT Left 02/22/2014   Procedure: CYSTOSCOPY WITH  RETROGRADE PYELOGRAM, URETEROSCOPY, STONE BASKET EXTRACTION ;  Surgeon: Kathi LudwigSigmund I Tannenbaum, MD;  Location: [facility];  Service: Urology;  Laterality: Left;  . tailbone cyst    . TUBAL LIGATION      OB History   No obstetric history on file.      Home Medications    Prior to Admission medications   Medication Sig Start Date End Date Taking? Authorizing Provider  acyclovir (ZOVIRAX) 400 MG tablet TAKE 1 TABLET BY MOUTH TWICE DAILY 12/26/18   Marcine MatarJohnson, Deborah B, MD  albuterol (PROVENTIL HFA;VENTOLIN HFA) 108 (90 Base) MCG/ACT inhaler Inhale 2 puffs into the lungs every 4 (four) hours as needed for wheezing or shortness of breath. 11/20/16   Beaulah DinningGambino, Christina M, MD  cetirizine (ZYRTEC) 10 MG tablet Take 1 tablet (10 mg total) by mouth daily. 11/20/16   Beaulah DinningGambino, Christina M, MD  clindamycin (CLEOCIN) 300 MG capsule Take 1 capsule (300 mg total) by mouth 2 (two) times daily for 7 days. 01/30/19 02/06/19  Braeley Buskey, Zachery DakinsKomlanvi S, FNP  fluticasone (FLONASE) 50 MCG/ACT nasal spray 1 spray in each nostril daily as needed for sinus congestion 04/25/18   Marcine MatarJohnson, Deborah B, MD  ibuprofen (ADVIL,MOTRIN) 800 MG tablet Take 1 tablet (800 mg total) by mouth every 8 (eight) hours as needed. 11/20/16   Beaulah DinningGambino, Christina M, MD  Lysine 1000 MG TABS Take 1 tablet (1,000 mg total) by mouth daily. 12/05/15   Uvaldo RisingFletke, Kyle J, MD  PROAIR HFA 108 [phone](90 Base) MCG/ACT inhaler inhale 2 puffs by  mouth every 4 hours if needed for wheezing or shortness of breath 11/30/16   Tobey Grim, MD  terbinafine (LAMISIL) 250 MG tablet Take 1 tablet (250 mg total) by mouth daily. 10/01/17   Mullis, Dara Lords, DO    Family History Family History  Problem Relation Age of Onset  . Arrhythmia Father        Pacemakers placed didn't take passed away 3 moths later  . Arrhythmia Sister        Pacemaker didn't take to body passed away 3 months ;later  . Hypertension Maternal Aunt   . Heart failure Maternal Grandmother   . Hypertension  Maternal Grandfather   . Diabetes Other   . CAD Other     Social History Social History   Tobacco Use  . Smoking status: Former Smoker    Start date: 11/19/2012    Quit date: 08/01/2015    Years since quitting: 3.5  . Smokeless tobacco: Never Used  Substance Use Topics  . Alcohol use: No  . Drug use: Yes    Types: Marijuana     Allergies   Doxycycline and Lorcet [hydrocodone-acetaminophen]   Review of Systems Review of Systems  Respiratory: Negative for shortness of breath.   Cardiovascular: Negative for chest pain.  Gastrointestinal: Positive for abdominal pain.  Genitourinary: Positive for flank pain.  Neurological: Positive for headaches.  ROS: all other are negatives   Physical Exam Triage Vital Signs ED Triage Vitals  Enc Vitals Group     BP 01/30/19 1711 (!) 141/102     Pulse Rate 01/30/19 1711 86     Resp 01/30/19 1711 18     Temp 01/30/19 1711 98.5 F (36.9 C)     Temp Source 01/30/19 1711 Oral     SpO2 01/30/19 1711 99 %     Weight --      Height --      Head Circumference --      Peak Flow --      Pain Score 01/30/19 1713 8     Pain Loc --      Pain Edu? --      Excl. in GC? --    No data found.  Updated Vital Signs BP (!) 141/102 (BP Location: Right Arm)   Pulse 86   Temp 98.5 F (36.9 C) (Oral)   Resp 18   LMP 01/20/2019   SpO2 99%   Visual Acuity Right Eye Distance:   Left Eye Distance:   Bilateral Distance:    Right Eye Near:   Left Eye Near:    Bilateral Near:     Physical Exam Vitals signs and nursing note reviewed.  Constitutional:      General: She is not in acute distress.    Appearance: Normal appearance. She is normal weight. She is not toxic-appearing.  HENT:     Head: Normocephalic.     Right Ear: Tympanic membrane, ear canal and external ear normal. There is no impacted cerumen.     Left Ear: Tympanic membrane and ear canal normal. There is no impacted cerumen.     Mouth/Throat:     Mouth: Mucous membranes are  moist.  Cardiovascular:     Rate and Rhythm: Normal rate and regular rhythm.     Pulses: Normal pulses.     Heart sounds: Normal heart sounds. No murmur.  Pulmonary:     Effort: Pulmonary effort is normal. No respiratory distress.     Breath sounds: Normal breath  sounds.  Chest:     Chest wall: No tenderness.  Abdominal:     General: Abdomen is flat. Bowel sounds are normal. There is no distension.     Palpations: There is no mass.  Genitourinary:    General: Normal vulva.  Neurological:     Mental Status: She is alert and oriented to person, place, and time.      UC Treatments / Results  Labs (all labs ordered are listed, but only abnormal results are displayed) Labs Reviewed  POCT URINALYSIS DIP (DEVICE) - Abnormal; Notable for the following components:      Result Value   Hgb urine dipstick SMALL (*)    All other components within normal limits  URINE CULTURE  RPR  HIV ANTIBODY (ROUTINE TESTING W REFLEX)  CERVICOVAGINAL ANCILLARY ONLY    EKG   Radiology No results found.  Procedures Procedures (including critical care time)  Medications Ordered in UC Medications - No data to display  Initial Impression / Assessment and Plan / UC Course  I have reviewed the triage vital signs and the nursing notes.  Pertinent labs & imaging results that were available during my care of the patient were reviewed by me and considered in my medical decision making (see chart for details).    Patient was treated for possible bacterial vaginosis. Will call patient when result becomes avalable Final Clinical Impressions(s) / UC Diagnoses   Final diagnoses:  Flank pain  Screen for STD (sexually transmitted disease)   Discharge Instructions   None    ED Prescriptions    Medication Sig Dispense Auth. Provider   clindamycin (CLEOCIN) 300 MG capsule Take 1 capsule (300 mg total) by mouth 2 (two) times daily for 7 days. 14 capsule Luetta Piazza, Darrelyn Hillock, FNP     PDMP not  reviewed this encounter.   Emerson Monte, [place] 01/30/19 [phone]

## 2019-01-31 LAB — URINE CULTURE: Culture: 60000 — AB

## 2019-01-31 LAB — RPR: RPR Ser Ql: NONREACTIVE

## 2019-02-02 LAB — CERVICOVAGINAL ANCILLARY ONLY
Bacterial vaginitis: POSITIVE — AB
Candida vaginitis: NEGATIVE
Chlamydia: NEGATIVE
Neisseria Gonorrhea: NEGATIVE
Trichomonas: NEGATIVE

## 2019-04-10 ENCOUNTER — Ambulatory Visit (HOSPITAL_COMMUNITY)
Admission: EM | Admit: 2019-04-10 | Discharge: 2019-04-10 | Disposition: A | Discharge status: Home / Self Care | Payer: Medicaid Other | Attending: Internal Medicine | Admitting: Internal Medicine

## 2019-04-10 ENCOUNTER — Other Ambulatory Visit: Payer: Self-pay

## 2019-04-10 ENCOUNTER — Encounter (HOSPITAL_COMMUNITY): Payer: Self-pay

## 2019-04-10 DIAGNOSIS — Z881 Allergy status to other antibiotic agents status: Secondary | ICD-10-CM | POA: Insufficient documentation

## 2019-04-10 DIAGNOSIS — Z885 Allergy status to narcotic agent status: Secondary | ICD-10-CM | POA: Diagnosis not present

## 2019-04-10 DIAGNOSIS — F17210 Nicotine dependence, cigarettes, uncomplicated: Secondary | ICD-10-CM | POA: Insufficient documentation

## 2019-04-10 DIAGNOSIS — Z8249 Family history of ischemic heart disease and other diseases of the circulatory system: Secondary | ICD-10-CM | POA: Insufficient documentation

## 2019-04-10 DIAGNOSIS — J0100 Acute maxillary sinusitis, unspecified: Secondary | ICD-10-CM | POA: Insufficient documentation

## 2019-04-10 DIAGNOSIS — R0981 Nasal congestion: Secondary | ICD-10-CM | POA: Diagnosis present

## 2019-04-10 DIAGNOSIS — Z20822 Contact with and (suspected) exposure to covid-19: Secondary | ICD-10-CM | POA: Diagnosis not present

## 2019-04-10 MED ORDER — CETIRIZINE HCL 10 MG PO TABS: 10.0000 mg | ORAL_TABLET | Freq: Every day | ORAL | 0 refills | Status: DC

## 2019-04-10 MED ORDER — AMOXICILLIN 500 MG PO CAPS: 500.0000 mg | ORAL_CAPSULE | Freq: Three times a day (TID) | ORAL | 0 refills | Status: AC

## 2019-04-10 MED ORDER — GUAIFENESIN ER 600 MG PO TB12: 600.0000 mg | ORAL_TABLET | Freq: Two times a day (BID) | ORAL | 0 refills | Status: AC

## 2019-04-10 MED ORDER — FLUTICASONE PROPIONATE 50 MCG/ACT NA SUSP: NASAL | 1 refills | Status: DC

## 2019-04-10 MED ORDER — ALBUTEROL SULFATE HFA 108 (90 BASE) MCG/ACT IN AERS: 2.0000 | INHALATION_SPRAY | RESPIRATORY_TRACT | 1 refills | Status: DC | PRN

## 2019-04-10 NOTE — ED Triage Notes (Signed)
Patient presents to Urgent Care with complaints of runny nose since almost two months ago and sore throat on the left for a month. Patient reports she has been using zyrtec and some old nasal spray (expired in 2018- education provided).  Pt endorses wheezing and fatigue, thinks she may have pneumonia again.

## 2019-04-10 NOTE — ED Provider Notes (Signed)
[facility]    CSN: [id number] Arrival date & time: 04/10/19  2683      History   Chief Complaint Chief Complaint  Patient presents with  . Nasal Congestion    HPI Gail Bass is a 44 y.o. female comes to urgent care with 24-month history of runny nose and a 1 month history of sore throat.  Symptoms have not improved in spite of using Zyrtec and an expired prescription of ipratropium nasal spray.  Patient recently started having discolored sputum.  Initially with nasal drainage was clear.  Has become yellow recently and is associated with some evidence of at the sinuses.  No actual pain.  Patient has some postnasal drip worse at night.  Patient also endorses increasing wheezing especially when she wakes up in the morning as well as fatigue.  She has a cough which is not productive of sputum she denies any fever or chills.  No dizziness, near syncope or syncopal episodes.  No known aggravating factors.   HPI  Past Medical History:  Diagnosis Date  . Asthma   . Bacterial vaginosis   . BV (bacterial vaginosis) 09/13/2008       . Hypertension 05/11/2013  . Kidney stone   . Pain in the chest 06/06/2015  . Painful lumpy right breast 03/21/2013  . Recurrent sinusitis 06/06/2015  . TUBAL PREGNANCY 09/13/2008   Qualifier: History of  By: Lanier Prude  MD, Cathrine Muster    . Vaginal discharge 01/10/2014    Patient Active Problem List   Diagnosis Date Noted  . Sinus congestion 04/25/2018  . Screen for STD (sexually transmitted disease) 12/05/2015  . Recurrent cold sores 12/05/2015  . PTSD (post-traumatic stress disorder) 12/05/2015  . External hemorrhoids 08/07/2015  . Former smoker 03/21/2013  . ASTHMA, INTERMITTENT 12/23/2009  . OVARIAN CYST, LEFT 10/29/2008  . MIGRAINE HEADACHE 09/13/2008  . MENORRHAGIA 09/13/2008  . Depression 06/21/2008    Past Surgical History:  Procedure Laterality Date  . CYSTECTOMY    . CYSTOSCOPY WITH RETROGRADE PYELOGRAM, URETEROSCOPY AND STENT  PLACEMENT Left 02/22/2014   Procedure: CYSTOSCOPY WITH RETROGRADE PYELOGRAM, URETEROSCOPY, STONE BASKET EXTRACTION ;  Surgeon: Kathi Ludwig, MD;  Location: [facility];  Service: Urology;  Laterality: Left;  . tailbone cyst    . TUBAL LIGATION      OB History   No obstetric history on file.      Home Medications    Prior to Admission medications   Medication Sig Start Date End Date Taking? Authorizing Provider  acyclovir (ZOVIRAX) 400 MG tablet TAKE 1 TABLET BY MOUTH TWICE DAILY 12/26/18   Marcine Matar, MD  albuterol (VENTOLIN HFA) 108 (90 Base) MCG/ACT inhaler Inhale 2 puffs into the lungs every 4 (four) hours as needed for wheezing or shortness of breath. 04/10/19   Kennidi Yoshida, Britta Mccreedy, MD  amoxicillin (AMOXIL) 500 MG capsule Take 1 capsule (500 mg total) by mouth 3 (three) times daily for 7 days. 04/10/19 04/17/19  Merrilee Jansky, MD  cetirizine (ZYRTEC) 10 MG tablet Take 1 tablet (10 mg total) by mouth daily. 04/10/19 05/10/19  Merrilee Jansky, MD  fluticasone (FLONASE) 50 MCG/ACT nasal spray 1 spray in each nostril daily as needed for sinus congestion 04/10/19   Daran Favaro, Britta Mccreedy, MD  guaiFENesin (MUCINEX) 600 MG 12 hr tablet Take 1 tablet (600 mg total) by mouth 2 (two) times daily for 10 days. 04/10/19 04/20/19  Merrilee Jansky, MD  ibuprofen (ADVIL,MOTRIN) 800 MG tablet Take 1 tablet (  800 mg total) by mouth every 8 (eight) hours as needed. 11/20/16   Carlyle Dolly, MD  Lysine 1000 MG TABS Take 1 tablet (1,000 mg total) by mouth daily. 12/05/15   Lupita Dawn, MD    Family History Family History  Problem Relation Age of Onset  . Arrhythmia Father        Pacemakers placed didn't take passed away 3 moths later  . Arrhythmia Sister        Pacemaker didn't take to body passed away 3 months ;later  . Hypertension Maternal Aunt   . Heart failure Maternal Grandmother   . Hypertension Maternal Grandfather   . Diabetes Other   . CAD Other     Social History Social  History   Tobacco Use  . Smoking status: Current Every Day Smoker    Packs/day: 0.50    Types: Cigarettes    Start date: 11/19/2012  . Smokeless tobacco: Never Used  Substance Use Topics  . Alcohol use: No  . Drug use: Yes    Types: Marijuana     Allergies   Doxycycline and Lorcet [hydrocodone-acetaminophen]   Review of Systems Review of Systems  Constitutional: Positive for fatigue. Negative for chills and fever.  HENT: Positive for congestion, rhinorrhea, sinus pressure and sore throat. Negative for drooling, ear discharge, ear pain, nosebleeds, sinus pain and tinnitus.   Respiratory: Positive for cough. Negative for shortness of breath and wheezing.   Cardiovascular: Negative for chest pain and palpitations.  Gastrointestinal: Negative for diarrhea, nausea and vomiting.  Musculoskeletal: Negative for arthralgias, gait problem and joint swelling.  Skin: Negative.   Neurological: Negative for dizziness, numbness and headaches.  Psychiatric/Behavioral: Negative for confusion and decreased concentration.     Physical Exam Triage Vital Signs ED Triage Vitals  Enc Vitals Group     BP 04/10/19 0858 (!) 166/99     Pulse Rate 04/10/19 0858 74     Resp 04/10/19 0858 18     Temp 04/10/19 0858 98.4 F (36.9 C)     Temp Source 04/10/19 0858 Oral     SpO2 04/10/19 0858 99 %     Weight --      Height --      Head Circumference --      Peak Flow --      Pain Score 04/10/19 0856 5     Pain Loc --      Pain Edu? --      Excl. in Lupus? --    No data found.  Updated Vital Signs BP (!) 166/99 (BP Location: Right Arm)   Pulse 74   Temp 98.4 F (36.9 C) (Oral)   Resp 18   SpO2 99%   Visual Acuity Right Eye Distance:   Left Eye Distance:   Bilateral Distance:    Right Eye Near:   Left Eye Near:    Bilateral Near:     Physical Exam Vitals and nursing note reviewed.  Constitutional:      General: She is not in acute distress.    Appearance: She is not ill-appearing.   HENT:     Right Ear: Tympanic membrane normal.     Left Ear: Tympanic membrane normal.     Ears:     Comments: Mild discomfort over the maxillary sinuses.  No tenderness over the frontal sinuses.    Nose: Congestion present.     Mouth/Throat:     Mouth: Mucous membranes are moist.     Pharynx:  No oropharyngeal exudate or posterior oropharyngeal erythema.  Cardiovascular:     Rate and Rhythm: Normal rate and regular rhythm.  Pulmonary:     Effort: Pulmonary effort is normal. No respiratory distress.     Breath sounds: No stridor. No wheezing, rhonchi or rales.  Abdominal:     General: Bowel sounds are normal. There is no distension.     Tenderness: There is no abdominal tenderness.     Hernia: No hernia is present.  Musculoskeletal:        General: No swelling, tenderness or deformity. Normal range of motion.     Cervical back: Normal range of motion. No rigidity or tenderness.  Lymphadenopathy:     Cervical: No cervical adenopathy.  Skin:    General: Skin is warm.     Capillary Refill: Capillary refill takes less than 2 seconds.     Findings: No bruising or erythema.  Neurological:     General: No focal deficit present.     Mental Status: She is alert.      UC Treatments / Results  Labs (all labs ordered are listed, but only abnormal results are displayed) Labs Reviewed  NOVEL CORONAVIRUS, NAA (HOSP ORDER, SEND-OUT TO REF LAB; TAT 18-24 HRS)    EKG   Radiology No results found.  Procedures Procedures (including critical care time)  Medications Ordered in UC Medications - No data to display  Initial Impression / Assessment and Plan / UC Course  I have reviewed the triage vital signs and the nursing notes.  Pertinent labs & imaging results that were available during my care of the patient were reviewed by me and considered in my medical decision making (see chart for details).     1.  Acute nonrecurrent maxillary sinusitis of greater than 10 days  duration: Flonase Amoxicillin 500 mg 3 times daily for 7 days Cetirizine 10 mg orally daily Continue albuterol inhaler Humibid 600 mg twice daily to loosen nasal secretions If patient symptoms worsens she is advised to return to urgent care to be reevaluated. Final Clinical Impressions(s) / UC Diagnoses   Final diagnoses:  Acute non-recurrent maxillary sinusitis   Discharge Instructions   None    ED Prescriptions    Medication Sig Dispense Auth. Provider   albuterol (VENTOLIN HFA) 108 (90 Base) MCG/ACT inhaler  (Status: Discontinued) Inhale 2 puffs into the lungs every 4 (four) hours as needed for wheezing or shortness of breath. 18 g Sly Parlee, Britta Mccreedy, MD   fluticasone (FLONASE) 50 MCG/ACT nasal spray 1 spray in each nostril daily as needed for sinus congestion 16 g Raena Pau, Britta Mccreedy, MD   amoxicillin (AMOXIL) 500 MG capsule Take 1 capsule (500 mg total) by mouth 3 (three) times daily for 7 days. 21 capsule Linkyn Gobin, Britta Mccreedy, MD   guaiFENesin (MUCINEX) 600 MG 12 hr tablet Take 1 tablet (600 mg total) by mouth 2 (two) times daily for 10 days. 20 tablet Jaretzi Droz, Britta Mccreedy, MD   cetirizine (ZYRTEC) 10 MG tablet Take 1 tablet (10 mg total) by mouth daily. 30 tablet Dotty Gonzalo, Britta Mccreedy, MD   albuterol (VENTOLIN HFA) 108 (90 Base) MCG/ACT inhaler Inhale 2 puffs into the lungs every 4 (four) hours as needed for wheezing or shortness of breath. 18 g Valrie Jia, Britta Mccreedy, MD     PDMP not reviewed this encounter.   Merrilee Jansky, MD 04/11/19 [phone]

## 2019-04-12 LAB — NOVEL CORONAVIRUS, NAA (HOSP ORDER, SEND-OUT TO REF LAB; TAT 18-24 HRS): SARS-CoV-2, NAA: NOT DETECTED

## 2019-05-17 ENCOUNTER — Ambulatory Visit (HOSPITAL_COMMUNITY)
Admission: EM | Admit: 2019-05-17 | Discharge: 2019-05-17 | Disposition: A | Discharge status: Home / Self Care | Payer: Medicaid Other | Attending: Family Medicine | Admitting: Family Medicine

## 2019-05-17 ENCOUNTER — Ambulatory Visit (INDEPENDENT_AMBULATORY_CARE_PROVIDER_SITE_OTHER): Payer: Medicaid Other

## 2019-05-17 ENCOUNTER — Encounter (HOSPITAL_COMMUNITY): Payer: Self-pay

## 2019-05-17 ENCOUNTER — Other Ambulatory Visit: Payer: Self-pay

## 2019-05-17 DIAGNOSIS — R05 Cough: Secondary | ICD-10-CM | POA: Diagnosis not present

## 2019-05-17 DIAGNOSIS — J302 Other seasonal allergic rhinitis: Secondary | ICD-10-CM | POA: Diagnosis not present

## 2019-05-17 DIAGNOSIS — R0602 Shortness of breath: Secondary | ICD-10-CM | POA: Diagnosis not present

## 2019-05-17 DIAGNOSIS — J45909 Unspecified asthma, uncomplicated: Secondary | ICD-10-CM

## 2019-05-17 MED ORDER — ALBUTEROL SULFATE HFA 108 (90 BASE) MCG/ACT IN AERS: 2.0000 | INHALATION_SPRAY | RESPIRATORY_TRACT | 0 refills | Status: DC | PRN

## 2019-05-17 MED ORDER — PREDNISONE 10 MG (21) PO TBPK: ORAL_TABLET | Freq: Every day | ORAL | 0 refills | Status: DC

## 2019-05-17 MED ORDER — CETIRIZINE HCL 10 MG PO TABS: 10.0000 mg | ORAL_TABLET | Freq: Every day | ORAL | 0 refills | Status: DC

## 2019-05-17 MED ORDER — MONTELUKAST SODIUM 10 MG PO TABS: 10.0000 mg | ORAL_TABLET | Freq: Every day | ORAL | 0 refills | Status: DC

## 2019-05-17 MED ORDER — IPRATROPIUM BROMIDE 0.06 % NA SOLN: 2.0000 | Freq: Three times a day (TID) | NASAL | 12 refills | Status: DC

## 2019-05-17 NOTE — ED Provider Notes (Signed)
[place]    CSN: [id number] Arrival date & time: 05/17/19  1307      History   Chief Complaint Chief Complaint  Patient presents with  . Shortness of Breath    HPI Gail Bass is a 44 y.o. female.   Gail Bass presents with complaints of shortness of breath. Chest heaviness. Worse with activity. Pain to center of chest and to back with deep breathing or with coughing. Coughing fits when she wakes up. Feels like she wheezes at night and when she wakes up. Non productive cough. She has significant nasal drainage. Similar symptoms 1 month ago and was treated for a sinus infection. Her symptoms have not improved. Her symptoms currently have been ongoing for the past 3 months. She has been using ceterizine and flonase as well. Has had similar issues to this in the past and has followed with ENT. Hasn't seen ENT in years due to change in insurance. States that ipratropium nasal spray has helped her in the past. She does have an albuterol inhaler for prn use which minimally helps.  Spring and winter seasons she tends to have symptoms like these. History of pneumonia which did feel similar. No fevers. Declines covid testing. No gi symptoms. No sore throat or ear pain. States she has a dog in the home which she feels she is allergic to as well.     ROS per HPI, negative if not otherwise mentioned.      Past Medical History:  Diagnosis Date  . Asthma   . Bacterial vaginosis   . BV (bacterial vaginosis) 09/13/2008       . Hypertension 05/11/2013  . Kidney stone   . Pain in the chest 06/06/2015  . Painful lumpy right breast 03/21/2013  . Recurrent sinusitis 06/06/2015  . TUBAL PREGNANCY 09/13/2008   Qualifier: History of  By: Drue Flirt  MD, Merrily Brittle    . Vaginal discharge 01/10/2014    Patient Active Problem List   Diagnosis Date Noted  . Sinus congestion 04/25/2018  . Screen for STD (sexually transmitted disease) 12/05/2015  . Recurrent cold sores 12/05/2015   . PTSD (post-traumatic stress disorder) 12/05/2015  . External hemorrhoids 08/07/2015  . Former smoker 03/21/2013  . ASTHMA, INTERMITTENT 12/23/2009  . OVARIAN CYST, LEFT 10/29/2008  . MIGRAINE HEADACHE 09/13/2008  . MENORRHAGIA 09/13/2008  . Depression 06/21/2008    Past Surgical History:  Procedure Laterality Date  . CYSTECTOMY    . CYSTOSCOPY WITH RETROGRADE PYELOGRAM, URETEROSCOPY AND STENT PLACEMENT Left 02/22/2014   Procedure: CYSTOSCOPY WITH RETROGRADE PYELOGRAM, URETEROSCOPY, STONE BASKET EXTRACTION ;  Surgeon: Ailene Rud, MD;  Location: [facility];  Service: Urology;  Laterality: Left;  . tailbone cyst    . TUBAL LIGATION      OB History   No obstetric history on file.      Home Medications    Prior to Admission medications   Medication Sig Start Date End Date Taking? Authorizing Provider  saccharomyces boulardii (FLORASTOR) 250 MG capsule Take 250 mg by mouth 2 (two) times daily.   Yes [provider]  acyclovir (ZOVIRAX) 400 MG tablet TAKE 1 TABLET BY MOUTH TWICE DAILY 12/26/18   Ladell Pier, MD  albuterol (VENTOLIN HFA) 108 (90 Base) MCG/ACT inhaler Inhale 2 puffs into the lungs every 4 (four) hours as needed for wheezing or shortness of breath. 05/17/19   Zigmund Gottron, NP  cetirizine (ZYRTEC) 10 MG tablet Take 1 tablet (10 mg total) by mouth  daily. 05/17/19 06/16/19  Georgetta Haber, NP  fluticasone (FLONASE) 50 MCG/ACT nasal spray 1 spray in each nostril daily as needed for sinus congestion 04/10/19   Lamptey, Britta Mccreedy, MD  ipratropium (ATROVENT) 0.06 % nasal spray Place 2 sprays into both nostrils 3 (three) times daily. 05/17/19   Linus Mako B, NP  Lysine 1000 MG TABS Take 1 tablet (1,000 mg total) by mouth daily. 12/05/15   Uvaldo Rising, MD  montelukast (SINGULAIR) 10 MG tablet Take 1 tablet (10 mg total) by mouth at bedtime. 05/17/19   Georgetta Haber, NP  predniSONE (STERAPRED UNI-PAK 21 TAB) 10 MG (21) TBPK tablet Take by mouth  daily. Per box instruction 05/17/19   Georgetta Haber, NP    Family History Family History  Problem Relation Age of Onset  . Arrhythmia Father        Pacemakers placed didn't take passed away 3 moths later  . Arrhythmia Sister        Pacemaker didn't take to body passed away 3 months ;later  . Hypertension Maternal Aunt   . Heart failure Maternal Grandmother   . Hypertension Maternal Grandfather   . Diabetes Other   . CAD Other     Social History Social History   Tobacco Use  . Smoking status: Current Every Day Smoker    Packs/day: 0.50    Types: Cigarettes    Start date: 11/19/2012  . Smokeless tobacco: Never Used  Substance Use Topics  . Alcohol use: No  . Drug use: Yes    Types: Marijuana     Allergies   Doxycycline and Lorcet [hydrocodone-acetaminophen]   Review of Systems Review of Systems   Physical Exam Triage Vital Signs ED Triage Vitals  Enc Vitals Group     BP 05/17/19 1349 (!) 158/87     Pulse Rate 05/17/19 1349 80     Resp 05/17/19 1349 20     Temp 05/17/19 1349 98.3 F (36.8 C)     Temp Source 05/17/19 1349 Oral     SpO2 05/17/19 1349 100 %     Weight 05/17/19 1350 150 lb (68 kg)     Height 05/17/19 1350 5\' 2"  (1.575 m)     Head Circumference --      Peak Flow --      Pain Score 05/17/19 1350 0     Pain Loc --      Pain Edu? --      Excl. in GC? --    No data found.  Updated Vital Signs BP (!) 158/87   Pulse 80   Temp 98.3 F (36.8 C) (Oral)   Resp 20   Ht 5\' 2"  (1.575 m)   Wt 150 lb (68 kg)   LMP 05/16/2019 (Exact Date)   SpO2 100%   BMI 27.44 kg/m    Physical Exam Constitutional:      General: She is not in acute distress.    Appearance: She is well-developed.  Cardiovascular:     Rate and Rhythm: Normal rate and regular rhythm.  Pulmonary:     Effort: Pulmonary effort is normal.     Breath sounds: No decreased breath sounds or wheezing.  Skin:    General: Skin is warm and dry.  Neurological:     Mental Status: She  is alert and oriented to person, place, and time.      UC Treatments / Results  Labs (all labs ordered are listed, but only abnormal results are  displayed) Labs Reviewed - No data to display  EKG   Radiology DG Chest 2 View  Result Date: 05/17/2019 CLINICAL DATA:  Shortness of breath, coughing congestion. EXAM: CHEST - 2 VIEW COMPARISON:  04/21/2017 FINDINGS: Heart size is normal. Mediastinal shadows are normal. The lungs are clear. No bronchial thickening. No infiltrate, mass, effusion or collapse. Pulmonary vascularity is normal. No bony abnormality. IMPRESSION: Normal chest Electronically Signed   By: Paulina Fusi M.D.   On: 05/17/2019 14:25    Procedures Procedures (including critical care time)  Medications Ordered in UC Medications - No data to display  Initial Impression / Assessment and Plan / UC Course  I have reviewed the triage vital signs and the nursing notes.  Pertinent labs & imaging results that were available during my care of the patient were reviewed by me and considered in my medical decision making (see chart for details).     Non toxic. Benign physical exam.  Afebrile. No work of breathing. Xray without acute findings. Symptoms seem more related to allergies. Prednisone provided as well as nasal spray, and will try singulair. Encouraged establish with PCP for recheck and management. Return precautions provided. Patient verbalized understanding and agreeable to plan.   Final Clinical Impressions(s) / UC Diagnoses   Final diagnoses:  Seasonal allergies  Asthma due to seasonal allergies     Discharge Instructions     Course of prednisone as prescribed.  Continue with daily zyrtec.  May try adding in nightly singulair.  Use of inhaler as needed for wheezing or shortness of breath.   Use of atrovent nasal spray for nasal drainage.  Please establish with a primary care provider for follow up and management.     ED Prescriptions    Medication Sig  Dispense Auth. Provider   albuterol (VENTOLIN HFA) 108 (90 Base) MCG/ACT inhaler Inhale 2 puffs into the lungs every 4 (four) hours as needed for wheezing or shortness of breath. 18 g Linus Mako B, NP   cetirizine (ZYRTEC) 10 MG tablet Take 1 tablet (10 mg total) by mouth daily. 30 tablet Glanda Spanbauer, Dorene Grebe B, NP   montelukast (SINGULAIR) 10 MG tablet Take 1 tablet (10 mg total) by mouth at bedtime. 30 tablet Linus Mako B, NP   predniSONE (STERAPRED UNI-PAK 21 TAB) 10 MG (21) TBPK tablet Take by mouth daily. Per box instruction 21 tablet Breeann Reposa B, NP   ipratropium (ATROVENT) 0.06 % nasal spray Place 2 sprays into both nostrils 3 (three) times daily. 15 mL Georgetta Haber, NP     PDMP not reviewed this encounter.   Georgetta Haber, NP 05/17/19 1445

## 2019-05-17 NOTE — Discharge Instructions (Addendum)
Course of prednisone as prescribed.  Continue with daily zyrtec.  May try adding in nightly singulair.  Use of inhaler as needed for wheezing or shortness of breath.   Use of atrovent nasal spray for nasal drainage.  Please establish with a primary care provider for follow up and management.

## 2019-05-17 NOTE — ED Triage Notes (Signed)
Pt c/o SOB, dyspnea w/exertion. Pt states when she inhales she gets midsternal chest pain and pain in her upper back. Pt states her chest feels heavy and it's hard for her to take a deep breath. Lungs sounds were diminished. Pt has non-labored breathing at this time. Pt has dry cough. Pt states she has clear mucous w/slight blood streak that comes out of her nose. Pt states she wheezed at night.

## 2019-06-13 ENCOUNTER — Telehealth: Payer: Self-pay | Admitting: Family Medicine

## 2019-06-13 ENCOUNTER — Ambulatory Visit (INDEPENDENT_AMBULATORY_CARE_PROVIDER_SITE_OTHER): Payer: Medicaid Other | Admitting: Family Medicine

## 2019-06-13 ENCOUNTER — Other Ambulatory Visit: Payer: Self-pay

## 2019-06-13 VITALS — BP 132/90 | Ht 62.0 in | Wt 162.0 lb

## 2019-06-13 DIAGNOSIS — L0292 Furuncle, unspecified: Secondary | ICD-10-CM

## 2019-06-13 DIAGNOSIS — J302 Other seasonal allergic rhinitis: Secondary | ICD-10-CM | POA: Diagnosis not present

## 2019-06-13 DIAGNOSIS — R0981 Nasal congestion: Secondary | ICD-10-CM

## 2019-06-13 DIAGNOSIS — F17200 Nicotine dependence, unspecified, uncomplicated: Secondary | ICD-10-CM | POA: Diagnosis not present

## 2019-06-13 DIAGNOSIS — B001 Herpesviral vesicular dermatitis: Secondary | ICD-10-CM

## 2019-06-13 MED ORDER — MUPIROCIN 2 % EX OINT: 1.0000 "application " | TOPICAL_OINTMENT | Freq: Two times a day (BID) | CUTANEOUS | 0 refills | Status: DC

## 2019-06-13 MED ORDER — CLINDAMYCIN PHOSPHATE 1 % EX GEL: Freq: Two times a day (BID) | CUTANEOUS | 0 refills | Status: DC

## 2019-06-13 MED ORDER — MONTELUKAST SODIUM 10 MG PO TABS: 10.0000 mg | ORAL_TABLET | Freq: Every day | ORAL | 0 refills | Status: DC

## 2019-06-13 MED ORDER — ACYCLOVIR 400 MG PO TABS: 400.0000 mg | ORAL_TABLET | Freq: Two times a day (BID) | ORAL | 0 refills | Status: DC

## 2019-06-13 MED ORDER — FLUTICASONE PROPIONATE 50 MCG/ACT NA SUSP: NASAL | 1 refills | Status: DC

## 2019-06-13 MED ORDER — CETIRIZINE HCL 10 MG PO TABS: 10.0000 mg | ORAL_TABLET | Freq: Every day | ORAL | 0 refills | Status: DC

## 2019-06-13 NOTE — Telephone Encounter (Signed)
Pt tried to get her prescription today after her visit, but Medicaid will not cover this. She would like something else called in.jw

## 2019-06-13 NOTE — Progress Notes (Deleted)
   SUBJECTIVE:   CHIEF COMPLAINT / HPI:   ***: ***  PERTINENT  PMH / PSH: ***  OBJECTIVE:  BP 132/90   Ht 5\' 2"  (1.575 m)   Wt 162 lb (73.5 kg)   LMP 05/16/2019 (Exact Date)   BMI 29.63 kg/m   General: NAD, pleasant Neck: Supple, no LAD Cardiovascular: RRR, no m/r/g, no LE edema*** Respiratory: CTA BL***, normal work of breathing Gastrointestinal: soft, nontender, nondistended, normoactive BS*** Neuro: CN II-XII grossly intact Psych: AOx3, appropriate affect ***  ASSESSMENT/PLAN:   No problem-specific Assessment & Plan notes found for this encounter.    05/18/2019 Masha Orbach, DO PGY-3, [place]

## 2019-06-13 NOTE — Patient Instructions (Signed)
Thank you for coming to see me today. It was a pleasure! Today we talked about:   For the area on your leg, I recommend that you continue with the warm compresses as often as you can 4-6 times per day.  It should continue to get better.  If it does not get better within the next 1 to 2 weeks, or worsens or you develop fever chills then please call our office or soon as possible.  I have sent an antibiotic ointment to your pharmacy that you can use twice daily on the area for the next 7 days.  I have refilled your medications.  Please follow-up as needed.  If you have any questions or concerns, please do not hesitate to call the office at [phone].  Take Care,   [place] Emme Rosenau, DO  Skin Abscess  A skin abscess is an infected area of your skin that contains pus and other material. An abscess can happen in any part of your body. Some abscesses break open (rupture) on their own. Most continue to get worse unless they are treated. The infection can spread deeper into the body and into your blood, which can make you feel sick. A skin abscess is caused by germs that enter the skin through a cut or scrape. It can also be caused by blocked oil and sweat glands or infected hair follicles. This condition is usually treated by:  Draining the pus.  Taking antibiotic medicines.  Placing a warm, wet washcloth over the abscess. Follow these instructions at home: Medicines   Take over-the-counter and prescription medicines only as told by your doctor.  If you were prescribed an antibiotic medicine, take it as told by your doctor. Do not stop taking the antibiotic even if you start to feel better. Abscess care   If you have an abscess that has not drained, place a warm, clean, wet washcloth over the abscess several times a day. Do this as told by your doctor.  Follow instructions from your doctor about how to take care of your abscess. Make sure you: ? Cover the abscess with a bandage  (dressing). ? Change your bandage or gauze as told by your doctor. ? Wash your hands with soap and water before you change the bandage or gauze. If you cannot use soap and water, use hand sanitizer.  Check your abscess every day for signs that the infection is getting worse. Check for: ? More redness, swelling, or pain. ? More fluid or blood. ? Warmth. ? More pus or a bad smell. General instructions  To avoid spreading the infection: ? Do not share personal care items, towels, or hot tubs with others. ? Avoid making skin-to-skin contact with other people.  Keep all follow-up visits as told by your doctor. This is important. Contact a doctor if:  You have more redness, swelling, or pain around your abscess.  You have more fluid or blood coming from your abscess.  Your abscess feels warm when you touch it.  You have more pus or a bad smell coming from your abscess.  You have a fever.  Your muscles ache.  You have chills.  You feel sick. Get help right away if:  You have very bad (severe) pain.  You see red streaks on your skin spreading away from the abscess. Summary  A skin abscess is an infected area of your skin that contains pus and other material.  The abscess is caused by germs that enter the skin through  a cut or scrape. It can also be caused by blocked oil and sweat glands or infected hair follicles.  Follow your doctor's instructions on caring for your abscess, taking medicines, preventing infections, and keeping follow-up visits. This information is not intended to replace advice given to you by your health care provider. Make sure you discuss any questions you have with your health care provider. Document Revised: 09/22/2018 Document Reviewed: 04/01/2017 Elsevier Patient Education  2020 [employer].

## 2019-06-13 NOTE — Progress Notes (Signed)
   SUBJECTIVE:   CHIEF COMPLAINT / HPI:   Boil on L inner thigh: Patient reports that she noticed a boil on her left inner thigh about 1 month ago.  She states she has had this before where it has needed to be drained.  She reports that she started using warm compresses as well as doing soaks and baths with epsom salt.  She avoided shaving and used Darene Lamer which caused the area to burn.  She states she went to the pharmacy and then was given a boil ease as well as a black salve in order to help dry it up.  Patient states that it became the size of a quarter but then since using the black salve and boil ease she has noticed that it has shrunk much smaller.  She states it is now less tender as well.  She is here because she is having to walk more work and it is still being irritated and she wanted to be sure it did not need to be drained.  She denies any fevers, chills, discharge of the area.  Seasonal allergies Patient reports that she has been out of her allergy medicine for some time and she is having issues with sneezing and itchy watery eyes.  She reports that she was previously on Zyrtec, Singulair and Flonase which helped her symptoms.  She would like refill of these medications.  She denies any trouble breathing or cough.  Cold sores Patient reports that she takes acyclovir daily in order to prevent her from developing cold sores.  She states that she has been on this medication for some time without any complications and is about to run out and would like a refill on her acyclovir.  Tobacco Use Disorder: Patient had stopped smoking for over  a year but has restarted and is interested in quitting again. She would like help.   PERTINENT  PMH / PSH: n/a  OBJECTIVE:  BP 132/90   Ht 5\' 2"  (1.575 m)   Wt 162 lb (73.5 kg)   LMP 05/16/2019 (Exact Date)   BMI 29.63 kg/m   General: NAD, pleasant Neck: Supple Respiratory: normal work of breathing Neuro: CN II-XII grossly intact Psych: AOx3,  appropriate affect Skin: small dime-size red area on left inner thigh with no drainage noted, non-fluctuant with no surrounding erythema   ASSESSMENT/PLAN:   Recurrent cold sores Refilled acyclovir  Boil   Procedure: Incision & drainage Informed consent:  Discussed risks (infection, pain, bleeding, bruising, and recurrence) and benefits of the procedure, as well as the alternatives.  Informed consent was obtained. Anesthesia: topical cold spray Type: partial The area was prepared and draped in a standard fashion. The lesion drained a small amount of blood and no purulent discharge noted. The patient tolerated the procedure well. The patient was instructed on post-op care with antibiotic cream and to continue with warm compresses and soaks. Return precautions dicussed and patient voiced understanding.   Seasonal allergies Seasonal allergies and hx of asthma. Refilled zyrtec, flonase and singulair  Tobacco use disorder Patient has recently started smoking the past few months and is interested in quitting. She would like help from our pharamacy clinic and thinks she would like to use patches, but is interested in other options.   05/18/2019 Rheya Minogue, DO PGY-3, [facility]

## 2019-06-14 DIAGNOSIS — J302 Other seasonal allergic rhinitis: Secondary | ICD-10-CM | POA: Insufficient documentation

## 2019-06-14 DIAGNOSIS — L0292 Furuncle, unspecified: Secondary | ICD-10-CM

## 2019-06-14 HISTORY — DX: Furuncle, unspecified: L02.92

## 2019-06-14 NOTE — Assessment & Plan Note (Signed)
Refilled acyclovir.

## 2019-06-14 NOTE — Assessment & Plan Note (Signed)
  Procedure: Incision & drainage Informed consent:  Discussed risks (infection, pain, bleeding, bruising, and recurrence) and benefits of the procedure, as well as the alternatives.  Informed consent was obtained. Anesthesia: topical cold spray Type: partial The area was prepared and draped in a standard fashion. The lesion drained a small amount of blood and no purulent discharge noted. The patient tolerated the procedure well. The patient was instructed on post-op care with antibiotic cream and to continue with warm compresses and soaks. Return precautions dicussed and patient voiced understanding.

## 2019-06-14 NOTE — Assessment & Plan Note (Signed)
Seasonal allergies and hx of asthma. Refilled zyrtec, flonase and singulair

## 2019-06-14 NOTE — Assessment & Plan Note (Signed)
Patient has recently started smoking the past few months and is interested in quitting. She would like help from our pharamacy clinic and thinks she would like to use patches, but is interested in other options.

## 2019-08-04 ENCOUNTER — Telehealth: Payer: Self-pay | Admitting: Family Medicine

## 2019-08-04 NOTE — Chronic Care Management (AMB) (Signed)
  Care Management   Outreach Note  08/04/2019 Name: Gail Bass MRN: [id number] DOB: 1975/11/01  Referred by: Joana Reamer, DO Reason for referral : Care Management (CM Initial outreach unsuccessful)   An unsuccessful telephone outreach was attempted today. The patient was referred to the case management team for assistance with care management and care coordination.   Follow Up Plan: A HIPPA compliant phone message was left for the patient providing contact information and requesting a return call.  The care management team will reach out to the patient again over the next 7 days.  If patient returns call to provider office, please advise to call Embedded Care Management Care Guide Elisha Ponder LPN at [phone]  Elisha Ponder, LPN Health Advisor, Embedded Care Coordination [facility] ??[email or web address] ??[phone]

## 2019-08-06 ENCOUNTER — Other Ambulatory Visit: Payer: Self-pay

## 2019-08-06 ENCOUNTER — Ambulatory Visit (HOSPITAL_COMMUNITY)
Admission: EM | Admit: 2019-08-06 | Discharge: 2019-08-06 | Disposition: A | Discharge status: Home / Self Care | Payer: Medicaid Other

## 2019-08-06 ENCOUNTER — Encounter (HOSPITAL_COMMUNITY): Payer: Self-pay

## 2019-08-06 DIAGNOSIS — S30860A Insect bite (nonvenomous) of lower back and pelvis, initial encounter: Secondary | ICD-10-CM | POA: Diagnosis not present

## 2019-08-06 DIAGNOSIS — W57XXXA Bitten or stung by nonvenomous insect and other nonvenomous arthropods, initial encounter: Secondary | ICD-10-CM | POA: Diagnosis not present

## 2019-08-06 DIAGNOSIS — R21 Rash and other nonspecific skin eruption: Secondary | ICD-10-CM

## 2019-08-06 MED ORDER — DICLOFENAC SODIUM 75 MG PO TBEC: 75.0000 mg | DELAYED_RELEASE_TABLET | Freq: Two times a day (BID) | ORAL | 0 refills | Status: DC

## 2019-08-06 MED ORDER — SULFAMETHOXAZOLE-TRIMETHOPRIM 800-160 MG PO TABS: 1.0000 | ORAL_TABLET | Freq: Two times a day (BID) | ORAL | 0 refills | Status: DC

## 2019-08-06 NOTE — Discharge Instructions (Addendum)
Soak area 20 minutes 4 times a day.  Take antibiotic as directed °

## 2019-08-06 NOTE — ED Triage Notes (Signed)
Pt presents to UC with swelling, pain and redness in the left buttock x 2 days. Pt states she think she was bite by spider.

## 2019-08-06 NOTE — ED Provider Notes (Addendum)
[facility]    CSN: [id number] Arrival date & time: 08/06/19  1009      History   Chief Complaint Chief Complaint  Patient presents with  . Insect Bite    HPI Gail Bass is a 44 y.o. female.   Pt complains of a swollen area left buttock.  Pt thinks she may have been bitten by a spider.  Pt also has a history of boils in the past   The history is provided by the patient. No language interpreter was used.    Past Medical History:  Diagnosis Date  . Asthma   . Bacterial vaginosis   . BV (bacterial vaginosis) 09/13/2008       . Hypertension 05/11/2013  . Kidney stone   . Pain in the chest 06/06/2015  . Painful lumpy right breast 03/21/2013  . Recurrent sinusitis 06/06/2015  . TUBAL PREGNANCY 09/13/2008   Qualifier: History of  By: Lanier Prude  MD, Cathrine Muster    . Vaginal discharge 01/10/2014    Patient Active Problem List   Diagnosis Date Noted  . Abscess of buttock 08/13/2019  . Boil 06/14/2019  . Seasonal allergies 06/14/2019  . Sinus congestion 04/25/2018  . Recurrent cold sores 12/05/2015  . PTSD (post-traumatic stress disorder) 12/05/2015  . External hemorrhoids 08/07/2015  . Vaginal discharge 01/10/2014  . Tobacco use disorder 03/21/2013  . ASTHMA, INTERMITTENT 12/23/2009  . OVARIAN CYST, LEFT 10/29/2008  . MIGRAINE HEADACHE 09/13/2008  . MENORRHAGIA 09/13/2008  . Depression 06/21/2008    Past Surgical History:  Procedure Laterality Date  . CYSTECTOMY    . CYSTOSCOPY WITH RETROGRADE PYELOGRAM, URETEROSCOPY AND STENT PLACEMENT Left 02/22/2014   Procedure: CYSTOSCOPY WITH RETROGRADE PYELOGRAM, URETEROSCOPY, STONE BASKET EXTRACTION ;  Surgeon: Kathi Ludwig, MD;  Location: [facility];  Service: Urology;  Laterality: Left;  . tailbone cyst    . TUBAL LIGATION      OB History   No obstetric history on file.      Home Medications    Prior to Admission medications   Medication Sig Start Date End Date Taking? Authorizing Provider    acyclovir (ZOVIRAX) 400 MG tablet Take 1 tablet (400 mg total) by mouth 2 (two) times daily. 08/13/19   McDonald, Mia A, PA-C  albuterol (VENTOLIN HFA) 108 (90 Base) MCG/ACT inhaler Inhale 2 puffs into the lungs every 4 (four) hours as needed for wheezing or shortness of breath. 05/17/19   Georgetta Haber, NP  cetirizine (ZYRTEC) 10 MG tablet Take 1 tablet (10 mg total) by mouth daily. 06/13/19 07/13/19  Shirley, [place], DO  diclofenac (VOLTAREN) 75 MG EC tablet Take 1 tablet (75 mg total) by mouth 2 (two) times daily. 08/06/19   Elson Areas, PA-C  fluticasone (FLONASE) 50 MCG/ACT nasal spray 1 spray in each nostril daily as needed for sinus congestion 06/13/19   Shirley, [place], DO  ibuprofen (ADVIL) 600 MG tablet Take 1 tablet (600 mg total) by mouth every 6 (six) hours as needed. 08/13/19   McDonald, Mia A, PA-C  ipratropium (ATROVENT) 0.06 % nasal spray Place 2 sprays into both nostrils 3 (three) times daily. 05/17/19   Linus Mako B, NP  Lysine 1000 MG TABS Take 1 tablet (1,000 mg total) by mouth daily. 12/05/15   Uvaldo Rising, MD  montelukast (SINGULAIR) 10 MG tablet Take 1 tablet (10 mg total) by mouth at bedtime. 06/13/19   Shirley, [place], DO  mupirocin ointment (BACTROBAN) 2 % Apply 1 application topically 2 (two)  times daily. 06/13/19   Shirley, [place], DO  predniSONE (STERAPRED UNI-PAK 21 TAB) 10 MG (21) TBPK tablet Take by mouth daily. Per box instruction 05/17/19   Zigmund Gottron, NP  saccharomyces boulardii (FLORASTOR) 250 MG capsule Take 250 mg by mouth 2 (two) times daily.    [provider]  SODIUM FLUORIDE 5000 PPM 1.1 % PSTE See admin instructions. 04/03/19   [provider]  sulfamethoxazole-trimethoprim (BACTRIM DS) 800-160 MG tablet Take 1 tablet by mouth 2 (two) times daily. 08/06/19   Fransico Meadow, PA-C    Family History Family History  Problem Relation Age of Onset  . Arrhythmia Father        Pacemakers placed didn't take passed away 3 moths later  .  Arrhythmia Sister        Pacemaker didn't take to body passed away 3 months ;later  . Hypertension Maternal Aunt   . Heart failure Maternal Grandmother   . Hypertension Maternal Grandfather   . Diabetes Other   . CAD Other   . Heart Problems Mother   . Heart failure Mother     Social History Social History   Tobacco Use  . Smoking status: Current Every Day Smoker    Packs/day: 0.50    Types: Cigarettes    Start date: 11/19/2012  . Smokeless tobacco: Never Used  Vaping Use  . Vaping Use: Never used  Substance Use Topics  . Alcohol use: No  . Drug use: Yes    Types: Marijuana     Allergies   Doxycycline and Lorcet [hydrocodone-acetaminophen]   Review of Systems Review of Systems  Skin: Positive for wound.  All other systems reviewed and are negative.    Physical Exam Triage Vital Signs ED Triage Vitals  Enc Vitals Group     BP 08/06/19 1052 (!) 152/85     Pulse Rate 08/06/19 1052 63     Resp 08/06/19 1052 18     Temp 08/06/19 1052 98.2 F (36.8 C)     Temp Source 08/06/19 1052 Oral     SpO2 08/06/19 1052 97 %     Weight --      Height --      Head Circumference --      Peak Flow --      Pain Score 08/06/19 1054 10     Pain Loc --      Pain Edu? --      Excl. in [place]? --    No data found.  Updated Vital Signs BP (!) 152/85 (BP Location: Left Arm)   Pulse 63   Temp 98.2 F (36.8 C) (Oral)   Resp 18   LMP  (Within Weeks) Comment: 3-4 weeks  SpO2 97%   Visual Acuity Right Eye Distance:   Left Eye Distance:   Bilateral Distance:    Right Eye Near:   Left Eye Near:    Bilateral Near:     Physical Exam Vitals reviewed.  Musculoskeletal:        General: Swelling present.     Comments: 6cm swollen area left buttock,  No fluctuance, erythematous,  Tiny scabbed area center   Skin:    Findings: Erythema present.  Neurological:     General: No focal deficit present.     Mental Status: She is alert.  Psychiatric:        Mood and Affect: Mood  normal.      UC Treatments / Results  Labs (all labs ordered are  listed, but only abnormal results are displayed) Labs Reviewed - No data to display  EKG   Radiology No results found.  Procedures Procedures (including critical care time)  Medications Ordered in UC Medications - No data to display  Initial Impression / Assessment and Plan / UC Course  I have reviewed the triage vital signs and the nursing notes.  Pertinent labs & imaging results that were available during my care of the patient were reviewed by me and considered in my medical decision making (see chart for details).     MDM: Pt given voltaren and bactrim  Recheck with your MD tomorrow  Final Clinical Impressions(s) / UC Diagnoses   Final diagnoses:  Insect bite, unspecified site, initial encounter  Rash, skin     Discharge Instructions     Soak area 20 minutes 4 times a day.  Take antibiotic as directed     ED Prescriptions    Medication Sig Dispense Auth. Provider   sulfamethoxazole-trimethoprim (BACTRIM DS) 800-160 MG tablet Take 1 tablet by mouth 2 (two) times daily. 20 tablet Aaliyana Fredericks K, [place]   diclofenac (VOLTAREN) 75 MG EC tablet Take 1 tablet (75 mg total) by mouth 2 (two) times daily. 20 tablet Elson Areas, [place]     PDMP not reviewed this encounter.  An After Visit Summary was printed and given to the patient.    Elson Areas, PA-C 08/06/19 1136    Elson Areas, [place] 10/30/19 1233

## 2019-08-07 ENCOUNTER — Other Ambulatory Visit: Payer: Self-pay

## 2019-08-07 ENCOUNTER — Telehealth: Payer: Self-pay | Admitting: Family Medicine

## 2019-08-07 ENCOUNTER — Ambulatory Visit (INDEPENDENT_AMBULATORY_CARE_PROVIDER_SITE_OTHER): Payer: Medicaid Other | Admitting: Family Medicine

## 2019-08-07 ENCOUNTER — Other Ambulatory Visit (HOSPITAL_COMMUNITY)
Admission: RE | Admit: 2019-08-07 | Discharge: 2019-08-07 | Disposition: A | Discharge status: Home / Self Care | Payer: Medicaid Other | Source: Ambulatory Visit | Attending: Family Medicine | Admitting: Family Medicine

## 2019-08-07 VITALS — BP 147/80 | HR 94 | Ht 62.0 in | Wt 156.2 lb

## 2019-08-07 DIAGNOSIS — L0231 Cutaneous abscess of buttock: Secondary | ICD-10-CM | POA: Diagnosis not present

## 2019-08-07 DIAGNOSIS — N898 Other specified noninflammatory disorders of vagina: Secondary | ICD-10-CM | POA: Insufficient documentation

## 2019-08-07 DIAGNOSIS — Z113 Encounter for screening for infections with a predominantly sexual mode of transmission: Secondary | ICD-10-CM | POA: Diagnosis not present

## 2019-08-07 LAB — POCT WET PREP (WET MOUNT)
Clue Cells Wet Prep Whiff POC: NEGATIVE
Trichomonas Wet Prep HPF POC: ABSENT

## 2019-08-07 NOTE — Patient Instructions (Addendum)
Lovely to see you today!! The rash is unlikely to be a spider bite, would expect a area of "necrosis" where the skin comes dark.  Continue the antibiotics prescribed.  You can also take the diclofenac and Tylenol together for pain.  Apply cold or hot compresses over the area and clean the area with soap and water.  Stay as comfortable as you can.  Would recommend that you closely monitor the rash and if it starts to get larger and drained more pus you develop fevers then please follow-up with your PCP  I will order a transvaginal ultrasound as it is unusual to get cervical pain and pain after sex with bacterial vaginosis.  I will contact you with the results of the test that we did today.   Best wishes,  Dr. Allena Katz

## 2019-08-07 NOTE — Progress Notes (Signed)
   Subjective:    Patient ID: Gail Bass, female    DOB: April 04, 1975, 44 y.o.   MRN: [id number]   CC: Gail Bass is a 44 yr old female who presents today for new rash and vaginal discharge  HPI:  Vaginal discharge Pt endorses new vaginal discharge which is green and beige at times. She has had this intermittently for the last year and she thinks she has BV or PID. Has been treated 2 times for BV this year. Has hx of PID. When she was younger was treated for gonorrhea and chlamydia. Has not changed sexual partners. Has unprotected intercourse with boyfriend which is sometimes painful. Boyfriend has not changed sexual partners. Sometimes she also has "spotting" after her period. Unsure of LMP but due for period in 1.5 weeks.   New rash Found spider in her bedroom a few days ago. She tried to chase it away but was unable to. Ended up going to sleep and woke up with a bite over her thigh and thinks it due to the spider. Went to the ER who started Bactrim and diclofenac. Pain from rash is so painful that she is unable to sit down, sleep etc. Denies fevers.  Smoking status reviewed   ROS: pertinent noted in the HPI   Past medical history, surgical, family, and social history reviewed and updated in the EMR as appropriate. Reviewed problem list.   Objective:  BP (!) 147/80   Pulse 94   Ht 5\' 2"  (1.575 m)   Wt 156 lb 3.2 oz (70.9 kg)   SpO2 100%   BMI 28.57 kg/m   Vitals and nursing note reviewed  General: NAD, pleasant, able to participate in exam Cardiac: RRR, S1 S2 present. normal heart sounds, no murmurs. Respiratory: CTAB, normal effort, No wheezes, rales or rhonchi Extremities: no edema or cyanosis. Skin: warm and dry, no rashes noted Neuro: alert, no obvious focal deficits Psych: Normal affect and mood       Assessment & Plan:    Abscess of buttock Abscess likely 2/2 bacterial infection. Per notes patient has had abscesses before. Unlikely to be spider bite  as would expect a central area of necrosis. Advised patient to continue Bactrim course and warm compresses. Safety netting provided, if she develops fevers or abscess gets larger, drains purulent material she should call [facility] or go to ER.  Vaginal discharge Wet prep and GC/Chlamydia obtained today which were both negative. Discharge is likely physiological. Would recommended patient continues to monitor her vaginal discharge and she could have a repeat wet prep at next clinic visit if symptoms are still persistent. Also ordered Pelvic TV [facility] to look for any anatomic abnormalities causing dyspyreunia. Would recommended follow up with PCP to further investigate cause of dyspyreunia.   [place], MD  [facility] PGY-1

## 2019-08-07 NOTE — Progress Notes (Signed)
we

## 2019-08-07 NOTE — Telephone Encounter (Signed)
Left voicemail to inform patient of her wet mount results from this morning.

## 2019-08-08 LAB — CERVICOVAGINAL ANCILLARY ONLY
Chlamydia: NEGATIVE
Comment: NEGATIVE
Comment: NORMAL
Neisseria Gonorrhea: NEGATIVE

## 2019-08-10 ENCOUNTER — Ambulatory Visit (HOSPITAL_COMMUNITY)
Admission: EM | Admit: 2019-08-10 | Discharge: 2019-08-10 | Disposition: A | Discharge status: Home / Self Care | Payer: Medicaid Other | Attending: Physician Assistant | Admitting: Physician Assistant

## 2019-08-10 ENCOUNTER — Other Ambulatory Visit: Payer: Self-pay

## 2019-08-10 ENCOUNTER — Encounter (HOSPITAL_COMMUNITY): Payer: Self-pay

## 2019-08-10 DIAGNOSIS — L0231 Cutaneous abscess of buttock: Secondary | ICD-10-CM | POA: Insufficient documentation

## 2019-08-10 MED ORDER — LIDOCAINE-EPINEPHRINE (PF) 2 %-1:200000 IJ SOLN
INTRAMUSCULAR | Status: AC
  Filled 2019-08-10: qty 20

## 2019-08-10 NOTE — Discharge Instructions (Signed)
Continue the bactrim  Wash the wound daily, keep it covered otherwise  Apply pressure around wound to encourage drainage for the next 48 hours  If appearing to heal well and  improving continue to monitor, if worsening or not improving follow up with urgent care or your pcp.

## 2019-08-10 NOTE — ED Provider Notes (Signed)
[facility]    CSN: [id number] Arrival date & time: 08/10/19  1143      History   Chief Complaint Chief Complaint  Patient presents with  . Abscess    HPI Gail Bass is a 44 y.o. female.   Patient returns urgent care for evaluation of suspected infection on left buttocks.  She was seen on 08/06/2019 for suspected insect bite/spider bite.  Was thought to have possible mild infection at the time placed on Bactrim twice daily.  Patient had follow-up the next day with her primary care was reevaluated and recommended to continue Bactrim.  She reports today she has not had any improvement on the redness is still present and much more painful now.  She believes it is swelling.  There is been no drainage.  She also reports on and off chills and not feeling well.  She reports she has never had an abscess before.  She reports she has been compliant with the medication.     Past Medical History:  Diagnosis Date  . Asthma   . Bacterial vaginosis   . BV (bacterial vaginosis) 09/13/2008       . Hypertension 05/11/2013  . Kidney stone   . Pain in the chest 06/06/2015  . Painful lumpy right breast 03/21/2013  . Recurrent sinusitis 06/06/2015  . TUBAL PREGNANCY 09/13/2008   Qualifier: History of  By: Drue Flirt  MD, Merrily Brittle    . Vaginal discharge 01/10/2014    Patient Active Problem List   Diagnosis Date Noted  . Boil 06/14/2019  . Seasonal allergies 06/14/2019  . Sinus congestion 04/25/2018  . Recurrent cold sores 12/05/2015  . PTSD (post-traumatic stress disorder) 12/05/2015  . External hemorrhoids 08/07/2015  . Tobacco use disorder 03/21/2013  . ASTHMA, INTERMITTENT 12/23/2009  . OVARIAN CYST, LEFT 10/29/2008  . MIGRAINE HEADACHE 09/13/2008  . MENORRHAGIA 09/13/2008  . Depression 06/21/2008    Past Surgical History:  Procedure Laterality Date  . CYSTECTOMY    . CYSTOSCOPY WITH RETROGRADE PYELOGRAM, URETEROSCOPY AND STENT PLACEMENT Left 02/22/2014   Procedure:  CYSTOSCOPY WITH RETROGRADE PYELOGRAM, URETEROSCOPY, STONE BASKET EXTRACTION ;  Surgeon: Ailene Rud, MD;  Location: [facility];  Service: Urology;  Laterality: Left;  . tailbone cyst    . TUBAL LIGATION      OB History   No obstetric history on file.      Home Medications    Prior to Admission medications   Medication Sig Start Date End Date Taking? Authorizing Provider  acyclovir (ZOVIRAX) 400 MG tablet Take 1 tablet (400 mg total) by mouth 2 (two) times daily. 06/13/19   Shirley, [place], DO  albuterol (VENTOLIN HFA) 108 (90 Base) MCG/ACT inhaler Inhale 2 puffs into the lungs every 4 (four) hours as needed for wheezing or shortness of breath. 05/17/19   Zigmund Gottron, NP  cetirizine (ZYRTEC) 10 MG tablet Take 1 tablet (10 mg total) by mouth daily. 06/13/19 07/13/19  Shirley, [place], DO  diclofenac (VOLTAREN) 75 MG EC tablet Take 1 tablet (75 mg total) by mouth 2 (two) times daily. 08/06/19   Fransico Meadow, PA-C  fluticasone (FLONASE) 50 MCG/ACT nasal spray 1 spray in each nostril daily as needed for sinus congestion 06/13/19   Shirley, [place], DO  ibuprofen (ADVIL) 200 MG tablet Take 200 mg by mouth every 6 (six) hours as needed.    [provider]  ipratropium (ATROVENT) 0.06 % nasal spray Place 2 sprays into both nostrils 3 (three) times daily. 05/17/19  Linus Mako B, NP  Lysine 1000 MG TABS Take 1 tablet (1,000 mg total) by mouth daily. 12/05/15   Uvaldo Rising, MD  montelukast (SINGULAIR) 10 MG tablet Take 1 tablet (10 mg total) by mouth at bedtime. 06/13/19   Shirley, [place], DO  mupirocin ointment (BACTROBAN) 2 % Apply 1 application topically 2 (two) times daily. 06/13/19   Shirley, [place], DO  predniSONE (STERAPRED UNI-PAK 21 TAB) 10 MG (21) TBPK tablet Take by mouth daily. Per box instruction 05/17/19   Georgetta Haber, NP  saccharomyces boulardii (FLORASTOR) 250 MG capsule Take 250 mg by mouth 2 (two) times daily.    [provider]  SODIUM FLUORIDE 5000  PPM 1.1 % PSTE See admin instructions. 04/03/19   [provider]  sulfamethoxazole-trimethoprim (BACTRIM DS) 800-160 MG tablet Take 1 tablet by mouth 2 (two) times daily. 08/06/19   Elson Areas, PA-C    Family History Family History  Problem Relation Age of Onset  . Arrhythmia Father        Pacemakers placed didn't take passed away 3 moths later  . Arrhythmia Sister        Pacemaker didn't take to body passed away 3 months ;later  . Hypertension Maternal Aunt   . Heart failure Maternal Grandmother   . Hypertension Maternal Grandfather   . Diabetes Other   . CAD Other   . Heart Problems Mother   . Heart failure Mother     Social History Social History   Tobacco Use  . Smoking status: Current Every Day Smoker    Packs/day: 0.50    Types: Cigarettes    Start date: 11/19/2012  . Smokeless tobacco: Never Used  Vaping Use  . Vaping Use: Never used  Substance Use Topics  . Alcohol use: No  . Drug use: Yes    Types: Marijuana     Allergies   Doxycycline and Lorcet [hydrocodone-acetaminophen]   Review of Systems Review of Systems   Physical Exam Triage Vital Signs ED Triage Vitals  Enc Vitals Group     BP 08/10/19 1235 (!) 160/93     Pulse Rate 08/10/19 1235 82     Resp 08/10/19 1235 17     Temp 08/10/19 1235 98.4 F (36.9 C)     Temp Source 08/10/19 1235 Oral     SpO2 08/10/19 1235 98 %     Weight 08/10/19 1232 156 lb (70.8 kg)     Height --      Head Circumference --      Peak Flow --      Pain Score 08/10/19 1231 10     Pain Loc --      Pain Edu? --      Excl. in GC? --    No data found.  Updated Vital Signs BP (!) 160/93 (BP Location: Right Arm)   Pulse 82   Temp 98.4 F (36.9 C) (Oral)   Resp 17   Wt 156 lb (70.8 kg)   LMP 07/03/2019   SpO2 98%   BMI 28.53 kg/m   Visual Acuity Right Eye Distance:   Left Eye Distance:   Bilateral Distance:    Right Eye Near:   Left Eye Near:    Bilateral Near:     Physical Exam Vitals and  nursing note reviewed.  Musculoskeletal:     Right lower leg: No edema.     Left lower leg: No edema.  Skin:    General: Skin is warm  and dry.     Comments: 2 cm x 3 cm area of induration left buttocks.  There is mild central fluctuance.  There is surrounding erythema consistent with cellulitis.  Consistent amount of erythema with image in 08/07/2019 primary care note.  Significant tenderness palpation throughout area of induration  Neurological:     Mental Status: She is alert.      UC Treatments / Results  Labs (all labs ordered are listed, but only abnormal results are displayed) Labs Reviewed  AEROBIC CULTURE (SUPERFICIAL SPECIMEN)    EKG   Radiology No results found.  Procedures Incision and Drainage  Date/Time: 08/10/2019 2:22 PM Performed by: Hermelinda Medicus, PA-C Authorized by: Hermelinda Medicus, PA-C   Consent:    Consent obtained:  Verbal   Consent given by:  Patient   Risks discussed:  Bleeding, incomplete drainage and pain   Alternatives discussed:  Alternative treatment, no treatment and referral Location:    Type:  Abscess   Location:  Lower extremity   Lower extremity location:  Buttock   Buttock location:  L buttock Pre-procedure details:    Skin preparation:  Betadine Anesthesia (see MAR for exact dosages):    Anesthesia method:  Local infiltration   Local anesthetic:  Lidocaine 2% WITH epi Procedure type:    Complexity:  Simple Procedure details:    Needle aspiration: no     Incision types:  Single straight   Scalpel blade:  11   Wound management:  Probed and deloculated and irrigated with saline   Drainage:  Bloody and purulent   Drainage amount:  Scant (Approximately 2 cc)   Wound treatment:  Wound left open   Packing materials:  None Post-procedure details:    Patient tolerance of procedure:  Tolerated well, no immediate complications   (including critical care time)  Medications Ordered in UC Medications - No data to display  Initial  Impression / Assessment and Plan / UC Course  I have reviewed the triage vital signs and the nursing notes.  Pertinent labs & imaging results that were available during my care of the patient were reviewed by me and considered in my medical decision making (see chart for details).     #Abscess left buttocks Patient is a 44 year old presenting with abscess to left buttocks.  Likely previous presentations immature abscess.  She has 5 days remaining on Bactrim.  Successful drainage today, believe she should complete the Bactrim.  Return and follow-up precautions were discussed.  Wound culture sent.  Patient verbalized understanding of plan of care and appropriate return and follow-up precautions. Final Clinical Impressions(s) / UC Diagnoses   Final diagnoses:  Abscess of buttock, left     Discharge Instructions     Continue the bactrim  Wash the wound daily, keep it covered otherwise  Apply pressure around wound to encourage drainage for the next 48 hours  If appearing to heal well and  improving continue to monitor, if worsening or not improving follow up with urgent care or your pcp.      ED Prescriptions    None     PDMP not reviewed this encounter.   Hermelinda Medicus, PA-C 08/10/19 1424

## 2019-08-10 NOTE — ED Triage Notes (Signed)
Pt is here with a spider bite that has turned into a abscess on her buttock. Pt states she has taken Benadryl to relieve discomfort.

## 2019-08-11 ENCOUNTER — Ambulatory Visit (HOSPITAL_COMMUNITY)
Admission: RE | Admit: 2019-08-11 | Discharge: 2019-08-11 | Disposition: A | Discharge status: Home / Self Care | Payer: Medicaid Other | Source: Ambulatory Visit | Attending: Family Medicine | Admitting: Family Medicine

## 2019-08-11 DIAGNOSIS — Z113 Encounter for screening for infections with a predominantly sexual mode of transmission: Secondary | ICD-10-CM | POA: Diagnosis not present

## 2019-08-11 DIAGNOSIS — N941 Unspecified dyspareunia: Secondary | ICD-10-CM | POA: Diagnosis not present

## 2019-08-11 NOTE — Chronic Care Management (AMB) (Signed)
  Care Management   Note  08/11/2019 Name: Gail Bass MRN: [id number] DOB: August 25, 1975  Gail Bass is a 44 y.o. year old female who is a primary care patient of Joana Reamer, DO. I reached out to Corey Skains by phone today in response to a referral sent by Gail Bass's health plan.    Gail Bass was given information about care management services today including:  1. Care management services include personalized support from designated clinical staff supervised by her physician, including individualized plan of care and coordination with other care providers 2. 24/7 contact phone numbers for assistance for urgent and routine care needs. 3. The patient may stop care management services at any time by phone call to the office staff.  Patient agreed to services and verbal consent obtained.   Follow up plan: Telephone appointment with care management team member scheduled for:08/21/2019  Elisha Ponder, LPN Health Advisor, Embedded Care Coordination [facility] Health Care Management ??[email or web address] ??[phone]

## 2019-08-12 LAB — AEROBIC CULTURE W GRAM STAIN (SUPERFICIAL SPECIMEN)

## 2019-08-13 ENCOUNTER — Other Ambulatory Visit: Payer: Self-pay

## 2019-08-13 ENCOUNTER — Emergency Department (HOSPITAL_COMMUNITY)
Admission: EM | Admit: 2019-08-13 | Discharge: 2019-08-13 | Disposition: A | Discharge status: Home / Self Care | Payer: Medicaid Other | Attending: Emergency Medicine | Admitting: Emergency Medicine

## 2019-08-13 ENCOUNTER — Encounter (HOSPITAL_COMMUNITY): Payer: Self-pay | Admitting: Emergency Medicine

## 2019-08-13 DIAGNOSIS — R110 Nausea: Secondary | ICD-10-CM | POA: Diagnosis not present

## 2019-08-13 DIAGNOSIS — S31829A Unspecified open wound of left buttock, initial encounter: Secondary | ICD-10-CM | POA: Diagnosis not present

## 2019-08-13 DIAGNOSIS — F17210 Nicotine dependence, cigarettes, uncomplicated: Secondary | ICD-10-CM | POA: Diagnosis not present

## 2019-08-13 DIAGNOSIS — Z7951 Long term (current) use of inhaled steroids: Secondary | ICD-10-CM | POA: Diagnosis not present

## 2019-08-13 DIAGNOSIS — Z792 Long term (current) use of antibiotics: Secondary | ICD-10-CM | POA: Insufficient documentation

## 2019-08-13 DIAGNOSIS — R05 Cough: Secondary | ICD-10-CM | POA: Diagnosis not present

## 2019-08-13 DIAGNOSIS — L0231 Cutaneous abscess of buttock: Secondary | ICD-10-CM | POA: Insufficient documentation

## 2019-08-13 DIAGNOSIS — M791 Myalgia, unspecified site: Secondary | ICD-10-CM | POA: Diagnosis not present

## 2019-08-13 DIAGNOSIS — I10 Essential (primary) hypertension: Secondary | ICD-10-CM | POA: Insufficient documentation

## 2019-08-13 DIAGNOSIS — S31829D Unspecified open wound of left buttock, subsequent encounter: Secondary | ICD-10-CM | POA: Diagnosis present

## 2019-08-13 DIAGNOSIS — J45909 Unspecified asthma, uncomplicated: Secondary | ICD-10-CM | POA: Diagnosis not present

## 2019-08-13 DIAGNOSIS — R509 Fever, unspecified: Secondary | ICD-10-CM | POA: Insufficient documentation

## 2019-08-13 DIAGNOSIS — Y838 Other surgical procedures as the cause of abnormal reaction of the patient, or of later complication, without mention of misadventure at the time of the procedure: Secondary | ICD-10-CM | POA: Insufficient documentation

## 2019-08-13 DIAGNOSIS — B001 Herpesviral vesicular dermatitis: Secondary | ICD-10-CM

## 2019-08-13 HISTORY — DX: Cutaneous abscess of buttock: L02.31

## 2019-08-13 MED ORDER — OXYCODONE-ACETAMINOPHEN 5-325 MG PO TABS
1.0000 | ORAL_TABLET | Freq: Once | ORAL | Status: AC
  Administered 2019-08-13: 1 via ORAL
  Filled 2019-08-13: qty 1

## 2019-08-13 MED ORDER — IBUPROFEN 600 MG PO TABS
600.0000 mg | ORAL_TABLET | Freq: Four times a day (QID) | ORAL | 0 refills | Status: DC | PRN
Start: 2019-08-13 — End: 2024-01-11

## 2019-08-13 MED ORDER — ACYCLOVIR 400 MG PO TABS: 400.0000 mg | ORAL_TABLET | Freq: Two times a day (BID) | ORAL | 0 refills | Status: DC

## 2019-08-13 MED ORDER — CLINDAMYCIN HCL 150 MG PO CAPS: 450.0000 mg | ORAL_CAPSULE | Freq: Three times a day (TID) | ORAL | 0 refills | Status: AC

## 2019-08-13 NOTE — Assessment & Plan Note (Addendum)
Wet prep and GC/Chlamydia obtained today which were both negative. Discharge is likely physiological. Would recommended patient continues to monitor her vaginal discharge and she could have a repeat wet prep at next clinic visit if symptoms are still persistent. Also ordered Pelvic TV US to look for any anatomic abnormalities causing dyspyreunia. Would recommended follow up with PCP to further investigate cause of dyspyreunia.

## 2019-08-13 NOTE — ED Provider Notes (Signed)
[facility] DEPT Provider Note   CSN: [id number] Arrival date & time: 08/13/19  1739     History Chief Complaint  Patient presents with  . Abscess    Gail Bass is a 44 y.o. female with a history of kidney stones, asthma, and HSV on daily acyclovir suppressive therapy who presents to the emergency department with a chief complaint of left buttock wound.  The patient reports that she developed a wound on her left buttock approximately 2 weeks ago.  She states that she initially noticed 2 small bumps on the area and was concerned that she may have been bitten by a spider.  She proceeded to pop one of the 2 lesions and clear fluid drained out.  She then developed redness spreading around to the initial area and was seen at urgent care on 6/6 where she was started on a 10-day course of 800 mg of Bactrim.  . She began taking Bactrim as prescribed.  She also reports that she has been soaking the area in the bathtub approximately 2 times daily, changing her dressings daily. She returned to the urgent care on 6/10 where she had an I&D performed.  I&D produced approximately 2 cc of bloody, purulent drainage.  The wound was culture and grew MRSA.  She returns today because she continues to have pain in and around the wound.  States that she is unable to sleep on her back due to the pain.  It is also uncomfortable to sit. The surrounding redness has resolved.  There is no red streaking, warmth, or induration.  However, she reports that she has developed low-grade fevers and has been having sweating at night for the last few days and has subsequently developed a cough and nausea.  She has been taking Tylenol for pain, but discontinued taking the diclofenac she was given in urgent care as it upset her stomach.  She also notes that her daughter has been ill with a viral URI after she developed a fever and cough 4 to 5 days ago.  No history of diabetes mellitus.  She also  reports that she is on daily acyclovir for HSV suppressive therapy for orolabial herpes lesions.  No history of genital herpes.  She denies any recent HSV outbreaks and reports that her partner has not had any recent HSV outbreaks.  The history is provided by the patient. No language interpreter was used.       Past Medical History:  Diagnosis Date  . Asthma   . Bacterial vaginosis   . BV (bacterial vaginosis) 09/13/2008       . Hypertension 05/11/2013  . Kidney stone   . Pain in the chest 06/06/2015  . Painful lumpy right breast 03/21/2013  . Recurrent sinusitis 06/06/2015  . TUBAL PREGNANCY 09/13/2008   Qualifier: History of  By: Drue Flirt  MD, Merrily Brittle    . Vaginal discharge 01/10/2014    Patient Active Problem List   Diagnosis Date Noted  . Abscess of buttock 08/13/2019  . Boil 06/14/2019  . Seasonal allergies 06/14/2019  . Sinus congestion 04/25/2018  . Recurrent cold sores 12/05/2015  . PTSD (post-traumatic stress disorder) 12/05/2015  . External hemorrhoids 08/07/2015  . Vaginal discharge 01/10/2014  . Tobacco use disorder 03/21/2013  . ASTHMA, INTERMITTENT 12/23/2009  . OVARIAN CYST, LEFT 10/29/2008  . MIGRAINE HEADACHE 09/13/2008  . MENORRHAGIA 09/13/2008  . Depression 06/21/2008    Past Surgical History:  Procedure Laterality Date  . CYSTECTOMY    .  CYSTOSCOPY WITH RETROGRADE PYELOGRAM, URETEROSCOPY AND STENT PLACEMENT Left 02/22/2014   Procedure: CYSTOSCOPY WITH RETROGRADE PYELOGRAM, URETEROSCOPY, STONE BASKET EXTRACTION ;  Surgeon: Kathi Ludwig, MD;  Location: [facility];  Service: Urology;  Laterality: Left;  . tailbone cyst    . TUBAL LIGATION       OB History   No obstetric history on file.     Family History  Problem Relation Age of Onset  . Arrhythmia Father        Pacemakers placed didn't take passed away 3 moths later  . Arrhythmia Sister        Pacemaker didn't take to body passed away 3 months ;later  . Hypertension Maternal Aunt   . Heart  failure Maternal Grandmother   . Hypertension Maternal Grandfather   . Diabetes Other   . CAD Other   . Heart Problems Mother   . Heart failure Mother     Social History   Tobacco Use  . Smoking status: Current Every Day Smoker    Packs/day: 0.50    Types: Cigarettes    Start date: 11/19/2012  . Smokeless tobacco: Never Used  Vaping Use  . Vaping Use: Never used  Substance Use Topics  . Alcohol use: No  . Drug use: Yes    Types: Marijuana    Home Medications Prior to Admission medications   Medication Sig Start Date End Date Taking? Authorizing Provider  acyclovir (ZOVIRAX) 400 MG tablet Take 1 tablet (400 mg total) by mouth 2 (two) times daily. 08/13/19   Eleftheria Taborn A, PA-C  albuterol (VENTOLIN HFA) 108 (90 Base) MCG/ACT inhaler Inhale 2 puffs into the lungs every 4 (four) hours as needed for wheezing or shortness of breath. 05/17/19   Georgetta Haber, NP  cetirizine (ZYRTEC) 10 MG tablet Take 1 tablet (10 mg total) by mouth daily. 06/13/19 07/13/19  Shirley, [place], DO  clindamycin (CLEOCIN) 150 MG capsule Take 3 capsules (450 mg total) by mouth 3 (three) times daily for 7 days. 08/13/19 08/20/19  Crystalina Stodghill A, PA-C  diclofenac (VOLTAREN) 75 MG EC tablet Take 1 tablet (75 mg total) by mouth 2 (two) times daily. 08/06/19   Elson Areas, PA-C  fluticasone (FLONASE) 50 MCG/ACT nasal spray 1 spray in each nostril daily as needed for sinus congestion 06/13/19   Shirley, [place], DO  ibuprofen (ADVIL) 600 MG tablet Take 1 tablet (600 mg total) by mouth every 6 (six) hours as needed. 08/13/19   Allan Bacigalupi A, PA-C  ipratropium (ATROVENT) 0.06 % nasal spray Place 2 sprays into both nostrils 3 (three) times daily. 05/17/19   Linus Mako B, NP  Lysine 1000 MG TABS Take 1 tablet (1,000 mg total) by mouth daily. 12/05/15   Uvaldo Rising, MD  montelukast (SINGULAIR) 10 MG tablet Take 1 tablet (10 mg total) by mouth at bedtime. 06/13/19   Shirley, [place], DO  mupirocin ointment (BACTROBAN)  2 % Apply 1 application topically 2 (two) times daily. 06/13/19   Shirley, [place], DO  predniSONE (STERAPRED UNI-PAK 21 TAB) 10 MG (21) TBPK tablet Take by mouth daily. Per box instruction 05/17/19   Georgetta Haber, NP  saccharomyces boulardii (FLORASTOR) 250 MG capsule Take 250 mg by mouth 2 (two) times daily.    [provider]  SODIUM FLUORIDE 5000 PPM 1.1 % PSTE See admin instructions. 04/03/19   [provider]  sulfamethoxazole-trimethoprim (BACTRIM DS) 800-160 MG tablet Take 1 tablet by mouth 2 (two) times daily. 08/06/19  Elson Areas, PA-C    Allergies    Doxycycline and Lorcet [hydrocodone-acetaminophen]  Review of Systems   Review of Systems  Constitutional: Positive for chills and fever. Negative for activity change.  Respiratory: Positive for cough. Negative for shortness of breath.   Cardiovascular: Negative for chest pain.  Gastrointestinal: Positive for nausea. Negative for abdominal pain, constipation, diarrhea and vomiting.  Genitourinary: Negative for dysuria.  Musculoskeletal: Positive for myalgias. Negative for arthralgias, back pain, neck pain and neck stiffness.  Skin: Positive for wound. Negative for color change and rash.  Allergic/Immunologic: Negative for immunocompromised state.  Neurological: Negative for seizures, syncope, weakness and headaches.  Psychiatric/Behavioral: Negative for confusion.    Physical Exam Updated Vital Signs BP (!) 156/111   Pulse 95   Temp 99 F (37.2 C)   Resp 16   LMP 07/19/2019   SpO2 96%   Physical Exam Vitals and nursing note reviewed.  Constitutional:      General: She is not in acute distress.    Appearance: She is not ill-appearing, toxic-appearing or diaphoretic.     Comments: Well-appearing.  Nontoxic.  HENT:     Head: Normocephalic.  Eyes:     Conjunctiva/sclera: Conjunctivae normal.  Cardiovascular:     Rate and Rhythm: Normal rate and regular rhythm.     Heart sounds: No murmur heard.    No friction rub. No gallop.   Pulmonary:     Effort: Pulmonary effort is normal. No respiratory distress.     Breath sounds: No stridor. No wheezing, rhonchi or rales.  Chest:     Chest wall: No tenderness.  Abdominal:     General: There is no distension.     Palpations: Abdomen is soft.  Musculoskeletal:     Cervical back: Neck supple.     Comments: Erythematous, circular nodular lesion noted to the left gluteal fold.  No active drainage.  Approximately 2 cm in diameter.  Minimal serosanguineous drainage able to be expressed.  There is no surrounding erythema, fluctuance, induration.  No red streaking.  No tenderness palpation to the thigh or surrounding buttock.  Skin:    General: Skin is warm.     Findings: No rash.  Neurological:     Mental Status: She is alert.  Psychiatric:        Behavior: Behavior normal.       ED Results / Procedures / Treatments   Labs (all labs ordered are listed, but only abnormal results are displayed) Labs Reviewed - No data to display  EKG None  Radiology No results found.  Procedures Ultrasound ED Soft Tissue  Date/Time: 08/13/2019 11:14 PM Performed by: Barkley Boards, PA-C Authorized by: Barkley Boards, PA-C   Procedure details:    Indications: localization of abscess     Transverse view:  Visualized   Longitudinal view:  Visualized   Images: archived   Location:    Location: buttocks     Side:  Left Findings:     no abscess present    cellulitis present   (including critical care time)  Medications Ordered in ED Medications  oxyCODONE-acetaminophen (PERCOCET/ROXICET) 5-325 MG per tablet 1 tablet (1 tablet Oral Given 08/13/19 2300)    ED Course  I have reviewed the triage vital signs and the nursing notes.  Pertinent labs & imaging results that were available during my care of the patient were reviewed by me and considered in my medical decision making (see chart for details).    MDM  Rules/Calculators/A&P                           44 year old female with a history of kidney stones, asthma, and HSV on daily acyclovir suppressive therapy presenting with a wound to the left gluteal fold for 2 weeks.  She has been on Bactrim for the last 9 days and has 1 day remaining.  She began developing low-grade fevers and chills over the last few days accompanied by a nonproductive cough, but I suspect this is a viral URI has her daughter developed similar symptoms in the same timeframe.  She has had some nausea, but I suspect this is secondary to Bactrim.  No vomiting or abdominal pain.  Vital signs are reassuring.  She does not appear septic.  I have a low suspicion for septic arthritis, myositis, necrotizing fasciitis.  There is no necrosis.  Wound appears much improved from previous visits and there is no surrounding cellulitis.  Bedside ultrasound with no fluctuance for drainable abscess.  Patient noted that when the lesion began to there were 2 wounds.  Patient does note that she is on suppressive therapy for oral labial HSV with acyclovir daily.  She denies any recent HSV outbreaks, and reports that her partner has not had any recent outbreaks.  She has no history of genital herpes.  However, after reviewing the photos of the initial wounds and the fact they drained clear fluid, I am questioning herpes gladiatorum.  However, she did have MRSA culture grow on 6/10.   Encouraged her to finish her last day of Bactrim, will switch her antibiotic to clindamycin, and encouraged her to increase her home acyclovir to 400 mg 3 times daily for the next 5 days.  I will also give her a referral to general surgery if the wound does not significantly improve in the next 5 days.  She was also given strict return precautions to the emergency department.  At this time, the patient is hemodynamically stable and in no acute distress.  Safe for discharge to home with outpatient follow-up as indicated.  Final Clinical Impression(s) / ED  Diagnoses Final diagnoses:  Wound of left buttock, subsequent encounter    Rx / DC Orders ED Discharge Orders         Ordered    acyclovir (ZOVIRAX) 400 MG tablet  2 times daily     Discontinue  Reprint     08/13/19 2258    ibuprofen (ADVIL) 600 MG tablet  Every 6 hours PRN     Discontinue  Reprint     08/13/19 2258    clindamycin (CLEOCIN) 150 MG capsule  3 times daily     Discontinue  Reprint     08/13/19 2258           Tyra Michelle A, PA-C 08/13/19 2320    Terald Sleeper, MD 08/14/19 1320

## 2019-08-13 NOTE — Discharge Instructions (Addendum)
Thank you for allowing me to care for you today in the Emergency Department.   Continue to clean the wound at least once daily with warm water and soap.  Pat the area dry then cover it with a dressing.  Make sure to change the dressing at least once daily or anytime he gets soiled.  Increase your home acyclovir to 3 tablets daily (once every 8 hours) for the next 5 days.  Finish your course of Bactrim as prescribed.  Take 3 capsules of clindamycin every 8 hours for the next week.  You can take clindamycin with Bactrim without a problem.  Take 650 mg of Tylenol or 600 mg of ibuprofen with food every 6 hours for pain.  You can alternate between these 2 medications every 3 hours if your pain returns.  For instance, you can take Tylenol at noon, followed by a dose of ibuprofen at 3, followed by second dose of Tylenol and 6.  Capsaicin cream is also available over-the-counter.  You can place this on any painful areas to your thigh or buttock, but do not place it directly over the wound.  It can help with pain, but note that it can cause increasing warmth to the skin and should be removed if it becomes uncomfortable.  Use as directed on the label.  Tylenol and ibuprofen should also help if you have a fever from a virus.  I suspect that this is also where your cough is coming from since your daughter has recently been ill with similar symptoms.  Follow-up with general surgery if the wound does not significantly improve in 5 days.  Return to the emergency department if you develop red streaking up or down the leg, if the area gets increasingly red, hot to the touch, swollen, or if you develop other new, concerning symptoms.

## 2019-08-13 NOTE — ED Triage Notes (Signed)
Pt reports has abscess on left buttocks for several days. Was on bactrim for it. Reports that culture came back as MRSA yesterday. Reports when she had boils in past needed clindamycin.

## 2019-08-13 NOTE — Assessment & Plan Note (Signed)
Abscess likely 2/2 bacterial infection. Per notes patient has had abscesses before. Unlikely to be spider bite as would expect a central area of necrosis. Advised patient to continue Bactrim course and warm compresses. Safety netting provided, if she develops fevers or abscess gets larger, drains purulent material she should call [facility] or go to ER.

## 2019-08-14 ENCOUNTER — Encounter: Payer: Self-pay | Admitting: Family Medicine

## 2019-08-14 ENCOUNTER — Telehealth (HOSPITAL_COMMUNITY): Payer: Self-pay | Admitting: Orthopedic Surgery

## 2019-08-21 ENCOUNTER — Ambulatory Visit: Payer: Medicaid Other

## 2019-08-21 ENCOUNTER — Other Ambulatory Visit: Payer: Self-pay

## 2019-08-21 NOTE — Chronic Care Management (AMB) (Signed)
  Care Management   Outreach Note  08/21/2019 Name: Gail Bass MRN: [id number] DOB: [birth year]  Referred by: Joana Reamer, DO Reason for referral : Care Coordination (Care Management RNCM Smoking Cessation)   An unsuccessful telephone outreach was attempted today. The patient was referred to the case management team for assistance with care management and care coordination.   Follow Up Plan: A HIPPA compliant phone message was left for the patient providing contact information and requesting a return call.  The care management team will reach out to the patient again over the next 7-14 days.   Juanell Fairly RN, BSN, [facility] Care Management Coordinator [facility] Phone: [phone] Fax: [phone]

## 2019-08-24 DIAGNOSIS — L0291 Cutaneous abscess, unspecified: Secondary | ICD-10-CM | POA: Diagnosis not present

## 2019-09-05 ENCOUNTER — Other Ambulatory Visit: Payer: Self-pay

## 2019-09-05 ENCOUNTER — Ambulatory Visit: Payer: Medicaid Other

## 2019-09-05 NOTE — Chronic Care Management (AMB) (Signed)
  Care Management   Outreach Note  09/05/2019 Name: Gail Bass MRN: [id number] DOB: 04/10/1975  Referred by: Joana Reamer, DO Reason for referral : Care Coordination (Care Management RNCM Smoking Cessation)   A second unsuccessful telephone outreach was attempted today. The patient was referred to the case management team for assistance with care management and care coordination.   Follow Up Plan: A HIPPA compliant phone message was left for the patient providing contact information and requesting a return call.  The care management team will reach out to the patient again over the next 7-14 days.   Juanell Fairly RN, BSN, [facility] Care Management Coordinator [facility] Phone: [phone] Fax: [phone]

## 2019-09-15 ENCOUNTER — Ambulatory Visit: Payer: Medicaid Other

## 2019-09-15 ENCOUNTER — Other Ambulatory Visit: Payer: Self-pay

## 2019-09-15 NOTE — Chronic Care Management (AMB) (Signed)
  Care Management   Outreach Note  09/15/2019 Name: Gail Bass MRN: [id number] DOB: 06-20-1975  Referred by: Joana Reamer, DO Reason for referral : Chronic Care Management (3 rd attempt smoking)   Third unsuccessful telephone outreach was attempted today. The patient was referred to the case management team for assistance with care management and care coordination. The patient's primary care provider has been notified of our unsuccessful attempts to make or maintain contact with the patient. The care management team is pleased to engage with this patient at any time in the future should he/she be interested in assistance from the care management team.   Follow Up Plan: The care management team is available to follow up with the patient after provider conversation with the patient regarding recommendation for care management engagement and subsequent re-referral to the care management team.   Juanell Fairly RN, BSN, [facility] Care Management Coordinator [facility] Phone: [phone] Fax: [phone]

## 2019-11-10 ENCOUNTER — Other Ambulatory Visit: Payer: Self-pay

## 2019-11-10 ENCOUNTER — Ambulatory Visit
Admission: EM | Admit: 2019-11-10 | Discharge: 2019-11-10 | Disposition: A | Discharge status: Home / Self Care | Payer: Medicaid Other

## 2019-11-10 DIAGNOSIS — T171XXA Foreign body in nostril, initial encounter: Secondary | ICD-10-CM

## 2019-11-10 NOTE — ED Triage Notes (Signed)
Pt states she believes she lost a nose ring backing in her nose. In an attempt to remove/dislodge it she used a q-tip and may have pushed it farther in. She advises may be 2 of them in there. Pt aox4 and ambulatory.

## 2019-11-10 NOTE — ED Provider Notes (Signed)
[facility]    CSN: [id number] Arrival date & time: 11/10/19  1556      History   Chief Complaint Chief Complaint  Patient presents with  . Foreign Body in Nose    possibly a nose ring back     HPI Gail Bass is a 44 y.o. female  Presenting for possible foreign body in right nare.  States that she lost her nose ring backing in her nose a few days ago.  This may have happened again a few weeks ago: Has used Q-tips without relief.  No epistasis, difficulty breathing or swallowing.  Past Medical History:  Diagnosis Date  . Asthma   . Bacterial vaginosis   . BV (bacterial vaginosis) 09/13/2008       . Hypertension 05/11/2013  . Kidney stone   . Pain in the chest 06/06/2015  . Painful lumpy right breast 03/21/2013  . Recurrent sinusitis 06/06/2015  . TUBAL PREGNANCY 09/13/2008   Qualifier: History of  By: Lanier Prude  MD, Cathrine Muster    . Vaginal discharge 01/10/2014    Patient Active Problem List   Diagnosis Date Noted  . Abscess of buttock 08/13/2019  . Boil 06/14/2019  . Seasonal allergies 06/14/2019  . Sinus congestion 04/25/2018  . Recurrent cold sores 12/05/2015  . PTSD (post-traumatic stress disorder) 12/05/2015  . External hemorrhoids 08/07/2015  . Vaginal discharge 01/10/2014  . Tobacco use disorder 03/21/2013  . ASTHMA, INTERMITTENT 12/23/2009  . OVARIAN CYST, LEFT 10/29/2008  . MIGRAINE HEADACHE 09/13/2008  . MENORRHAGIA 09/13/2008  . Depression 06/21/2008    Past Surgical History:  Procedure Laterality Date  . CYSTECTOMY    . CYSTOSCOPY WITH RETROGRADE PYELOGRAM, URETEROSCOPY AND STENT PLACEMENT Left 02/22/2014   Procedure: CYSTOSCOPY WITH RETROGRADE PYELOGRAM, URETEROSCOPY, STONE BASKET EXTRACTION ;  Surgeon: Kathi Ludwig, MD;  Location: [facility];  Service: Urology;  Laterality: Left;  . tailbone cyst    . TUBAL LIGATION      OB History   No obstetric history on file.      Home Medications    Prior to Admission medications    Medication Sig Start Date End Date Taking? Authorizing Provider  acyclovir (ZOVIRAX) 400 MG tablet Take 1 tablet (400 mg total) by mouth 2 (two) times daily. 08/13/19  Yes McDonald, Mia A, PA-C  albuterol (VENTOLIN HFA) 108 (90 Base) MCG/ACT inhaler Inhale 2 puffs into the lungs every 4 (four) hours as needed for wheezing or shortness of breath. 05/17/19  Yes Georgetta Haber, NP  fluticasone (FLONASE) 50 MCG/ACT nasal spray 1 spray in each nostril daily as needed for sinus congestion 06/13/19  Yes Talbert Forest, [place], DO  ibuprofen (ADVIL) 600 MG tablet Take 1 tablet (600 mg total) by mouth every 6 (six) hours as needed. 08/13/19  Yes McDonald, Mia A, PA-C  ipratropium (ATROVENT) 0.06 % nasal spray Place 2 sprays into both nostrils 3 (three) times daily. 05/17/19  Yes Burky, Dorene Grebe B, NP  montelukast (SINGULAIR) 10 MG tablet Take 1 tablet (10 mg total) by mouth at bedtime. 06/13/19  Yes Talbert Forest, [place], DO  cetirizine (ZYRTEC) 10 MG tablet Take 1 tablet (10 mg total) by mouth daily. 06/13/19 07/13/19  Shirley, [place], DO  diclofenac (VOLTAREN) 75 MG EC tablet Take 1 tablet (75 mg total) by mouth 2 (two) times daily. 08/06/19   Elson Areas, PA-C  Lysine 1000 MG TABS Take 1 tablet (1,000 mg total) by mouth daily. 12/05/15   Uvaldo Rising, MD  Family History Family History  Problem Relation Age of Onset  . Arrhythmia Father        Pacemakers placed didn't take passed away 3 moths later  . Arrhythmia Sister        Pacemaker didn't take to body passed away 3 months ;later  . Hypertension Maternal Aunt   . Heart failure Maternal Grandmother   . Hypertension Maternal Grandfather   . Diabetes Other   . CAD Other   . Heart Problems Mother   . Heart failure Mother     Social History Social History   Tobacco Use  . Smoking status: Current Every Day Smoker    Packs/day: 0.50    Types: Cigarettes    Start date: 11/19/2012  . Smokeless tobacco: Never Used  Vaping Use  . Vaping Use: Never used   Substance Use Topics  . Alcohol use: No  . Drug use: Yes    Types: Marijuana     Allergies   Doxycycline and Lorcet [hydrocodone-acetaminophen]   Review of Systems As per HPI   Physical Exam Triage Vital Signs ED Triage Vitals  Enc Vitals Group     BP 11/10/19 1738 (!) 164/98     Pulse Rate 11/10/19 1738 72     Resp 11/10/19 1738 17     Temp 11/10/19 1738 98.3 F (36.8 C)     Temp Source 11/10/19 1738 Oral     SpO2 --      Weight --      Height --      Head Circumference --      Peak Flow --      Pain Score 11/10/19 1740 2     Pain Loc --      Pain Edu? --      Excl. in GC? --    No data found.  Updated Vital Signs BP (!) 164/98 (BP Location: Left Arm)   Pulse 72   Temp 98.3 F (36.8 C) (Oral)   Resp 17   Visual Acuity Right Eye Distance:   Left Eye Distance:   Bilateral Distance:    Right Eye Near:   Left Eye Near:    Bilateral Near:     Physical Exam Constitutional:      General: She is not in acute distress. HENT:     Head: Normocephalic and atraumatic.     Nose: Nose normal.     Comments: No FB noted Eyes:     General: No scleral icterus.    Pupils: Pupils are equal, round, and reactive to light.  Cardiovascular:     Rate and Rhythm: Normal rate.  Pulmonary:     Effort: Pulmonary effort is normal.  Skin:    Coloration: Skin is not jaundiced or pale.  Neurological:     Mental Status: She is alert and oriented to person, place, and time.      UC Treatments / Results  Labs (all labs ordered are listed, but only abnormal results are displayed) Labs Reviewed - No data to display  EKG   Radiology No results found.  Procedures Procedures (including critical care time)  Medications Ordered in UC Medications - No data to display  Initial Impression / Assessment and Plan / UC Course  I have reviewed the triage vital signs and the nursing notes.  Pertinent labs & imaging results that were available during my care of the patient  were reviewed by me and considered in my medical decision making (see chart for details).  Foreign body not visualized.  Provided ENT follow-up information for possible retrieval.  Return precautions discussed, pt verbalized understanding and is agreeable to plan. Final Clinical Impressions(s) / UC Diagnoses   Final diagnoses:  Foreign body in nose, initial encounter   Discharge Instructions   None    ED Prescriptions    None     PDMP not reviewed this encounter.   Odette Fraction Syl[place], [place] 11/10/19 1932

## 2019-11-21 ENCOUNTER — Ambulatory Visit (INDEPENDENT_AMBULATORY_CARE_PROVIDER_SITE_OTHER): Payer: Medicaid Other | Admitting: Family Medicine

## 2019-11-21 ENCOUNTER — Other Ambulatory Visit: Payer: Self-pay

## 2019-11-21 VITALS — BP 135/84 | HR 92 | Ht 62.0 in | Wt 158.4 lb

## 2019-11-21 DIAGNOSIS — K645 Perianal venous thrombosis: Secondary | ICD-10-CM

## 2019-11-21 DIAGNOSIS — R0981 Nasal congestion: Secondary | ICD-10-CM

## 2019-11-21 DIAGNOSIS — J302 Other seasonal allergic rhinitis: Secondary | ICD-10-CM

## 2019-11-21 DIAGNOSIS — B001 Herpesviral vesicular dermatitis: Secondary | ICD-10-CM | POA: Diagnosis not present

## 2019-11-21 DIAGNOSIS — K644 Residual hemorrhoidal skin tags: Secondary | ICD-10-CM | POA: Diagnosis not present

## 2019-11-21 MED ORDER — HYDROCORTISONE (PERIANAL) 2.5 % EX CREA: 1.0000 "application " | TOPICAL_CREAM | Freq: Two times a day (BID) | CUTANEOUS | 1 refills | Status: DC

## 2019-11-21 MED ORDER — FLUTICASONE PROPIONATE 50 MCG/ACT NA SUSP: NASAL | 1 refills | Status: DC

## 2019-11-21 MED ORDER — CETIRIZINE HCL 10 MG PO TABS: 10.0000 mg | ORAL_TABLET | Freq: Every day | ORAL | 0 refills | Status: DC

## 2019-11-21 MED ORDER — ACYCLOVIR 400 MG PO TABS: 400.0000 mg | ORAL_TABLET | Freq: Two times a day (BID) | ORAL | 0 refills | Status: DC

## 2019-11-21 NOTE — Patient Instructions (Addendum)
It was nice to see you today,   You have external hemorrhoids, and one of them appears to be thrombosed.  I will send in a referral to the general surgeon for further treatment.  In the meantime, continue using OTC hemorrhoid cream and suppositories.  You can continue to use the aloe gel if that helps your symptoms.  You can use fiber supplements to soften your stool.  Continue your goal of 1 soft bowel movement per day.    Have a great day,   Frederic Jericho, MD

## 2019-11-21 NOTE — Progress Notes (Signed)
    SUBJECTIVE:   CHIEF COMPLAINT / HPI:   Hemorrhoids: Patient has experienced hemorrhoids since she was 20.  Over the past 3 years they have been worse.  She states she has 3 external hemorrhoids.  One of them is purple and thrombosed.  She has once daily soft bowel movements.  When her hemorrhoids are inflamed there will be bright red blood on the toilet paper.  Right now they are slightly better than they were the past week after she put aloe from an aloe plant on them last night.  Patient using Preparation H, Tucks pads, suppository for treatment.  HSV: Patient takes acyclovir for suppression.  States if she goes a few days without taking it she will develop a lesion on her lip.  Would like a refill today.  Allergies: Patient has seasonal allergies and is out of her seasonal allergy medication.  Would like refills today.  PERTINENT  PMH / PSH: hemorrhoids, HSV, seasonal allergies.   OBJECTIVE:   BP 135/84   Pulse 92   Ht 5\' 2"  (1.575 m)   Wt 158 lb 6.4 oz (71.8 kg)   SpO2 98%   BMI 28.97 kg/m   Gen: alert, oriented. No acute distress.  HEENT: no perioral lesions CV: RRR.  No murmurs.   pulm : LCTAB.  GI: pea sized perianal external hemorrhoid visualized. Tender to palpation. No discoloration.  No fissures appreciated.  No internal hemorrhoids visualized with anoscope.    ASSESSMENT/PLAN:   External hemorrhoids External hemorrhoids noted on exam with 1 thrombosed.  No internal hemorrhoids seen on anoscopy.  Prescribed Anusol cream.  Refer to general surgery for possible surgical treatment.  Recurrent cold sores Refilled acyclovir.  No lesions seen today.  Seasonal allergies Refilled Zyrtec and Flonase     , MD [facility]

## 2019-11-25 NOTE — Assessment & Plan Note (Signed)
Refilled Zyrtec and Flonase. 

## 2019-11-25 NOTE — Assessment & Plan Note (Signed)
Refilled acyclovir.  No lesions seen today.

## 2019-11-25 NOTE — Assessment & Plan Note (Signed)
External hemorrhoids noted on exam with 1 thrombosed.  No internal hemorrhoids seen on anoscopy.  Prescribed Anusol cream.  Refer to general surgery for possible surgical treatment.

## 2020-02-07 ENCOUNTER — Ambulatory Visit (HOSPITAL_COMMUNITY)
Admission: EM | Admit: 2020-02-07 | Discharge: 2020-02-07 | Disposition: A | Discharge status: Home / Self Care | Payer: Medicaid Other | Attending: Internal Medicine | Admitting: Internal Medicine

## 2020-02-07 ENCOUNTER — Encounter (HOSPITAL_COMMUNITY): Payer: Self-pay | Admitting: *Deleted

## 2020-02-07 ENCOUNTER — Other Ambulatory Visit: Payer: Self-pay

## 2020-02-07 DIAGNOSIS — Z1152 Encounter for screening for COVID-19: Secondary | ICD-10-CM | POA: Diagnosis present

## 2020-02-07 DIAGNOSIS — J029 Acute pharyngitis, unspecified: Secondary | ICD-10-CM | POA: Diagnosis not present

## 2020-02-07 DIAGNOSIS — R030 Elevated blood-pressure reading, without diagnosis of hypertension: Secondary | ICD-10-CM | POA: Diagnosis present

## 2020-02-07 DIAGNOSIS — R0981 Nasal congestion: Secondary | ICD-10-CM | POA: Insufficient documentation

## 2020-02-07 DIAGNOSIS — B001 Herpesviral vesicular dermatitis: Secondary | ICD-10-CM | POA: Insufficient documentation

## 2020-02-07 LAB — SARS CORONAVIRUS 2 (TAT 6-24 HRS): SARS Coronavirus 2: NEGATIVE

## 2020-02-07 LAB — POCT RAPID STREP A, ED / UC: Streptococcus, Group A Screen (Direct): NEGATIVE

## 2020-02-07 MED ORDER — ACYCLOVIR 400 MG PO TABS: 400.0000 mg | ORAL_TABLET | Freq: Two times a day (BID) | ORAL | 0 refills | Status: DC

## 2020-02-07 MED ORDER — KETOROLAC TROMETHAMINE 60 MG/2ML IM SOLN
INTRAMUSCULAR | Status: AC
  Filled 2020-02-07: qty 2

## 2020-02-07 MED ORDER — ACETAMINOPHEN 325 MG PO TABS
ORAL_TABLET | ORAL | Status: AC
  Filled 2020-02-07: qty 2

## 2020-02-07 MED ORDER — KETOROLAC TROMETHAMINE 60 MG/2ML IM SOLN
60.0000 mg | Freq: Once | INTRAMUSCULAR | Status: AC
  Administered 2020-02-07: 60 mg via INTRAMUSCULAR

## 2020-02-07 MED ORDER — CETIRIZINE HCL 10 MG PO TABS: 10.0000 mg | ORAL_TABLET | Freq: Every day | ORAL | 0 refills | Status: DC

## 2020-02-07 MED ORDER — FLUTICASONE PROPIONATE 50 MCG/ACT NA SUSP
1.0000 | Freq: Every day | NASAL | 0 refills | Status: DC
Start: 2020-02-07 — End: 2020-03-13

## 2020-02-07 MED ORDER — ACETAMINOPHEN 325 MG PO TABS
650.0000 mg | ORAL_TABLET | Freq: Once | ORAL | Status: AC
  Administered 2020-02-07: 650 mg via ORAL

## 2020-02-07 NOTE — ED Triage Notes (Signed)
Saturday night had headache, Sunday night she had fried chicken and felt like throat was scratched. States that she also started having right shoulder pain. States she feels feverish but does not have fever.

## 2020-02-07 NOTE — ED Provider Notes (Signed)
[facility]    CSN: [id number] Arrival date & time: 02/07/20  3419      History   Chief Complaint Chief Complaint  Patient presents with  . Sore Throat    HPI Gail Bass is a 44 y.o. female with past medical history of asthma.  Patient has been told her blood pressure has been high before though she has not followed up for this and has never been on antihypertensives.  Patient presents with 5-day history of headache and sore throat.  Reports chills but denies any fever, rhinorrhea, cough.  Loss of taste or smell.  Patient states symptoms began on Saturday when she felt like chicken bone scratched her throat.  She denies any difficulty breathing or swelling and mouth or throat.  Patient fully vaccinated against COVID-19.  She has not received COVID-19 booster.  She works in [employer] at [employer].  Patient also requesting refill of daily medications.   Past Medical History:  Diagnosis Date  . Asthma   . Bacterial vaginosis   . BV (bacterial vaginosis) 09/13/2008       . Hypertension 05/11/2013  . Kidney stone   . Pain in the chest 06/06/2015  . Painful lumpy right breast 03/21/2013  . Recurrent sinusitis 06/06/2015  . TUBAL PREGNANCY 09/13/2008   Qualifier: History of  By: Lanier Prude  MD, Cathrine Muster    . Vaginal discharge 01/10/2014    Patient Active Problem List   Diagnosis Date Noted  . Abscess of buttock 08/13/2019  . Boil 06/14/2019  . Seasonal allergies 06/14/2019  . Sinus congestion 04/25/2018  . Recurrent cold sores 12/05/2015  . PTSD (post-traumatic stress disorder) 12/05/2015  . External hemorrhoids 08/07/2015  . Vaginal discharge 01/10/2014  . Tobacco use disorder 03/21/2013  . ASTHMA, INTERMITTENT 12/23/2009  . OVARIAN CYST, LEFT 10/29/2008  . MIGRAINE HEADACHE 09/13/2008  . MENORRHAGIA 09/13/2008  . Depression 06/21/2008    Past Surgical History:  Procedure Laterality Date  . CYSTECTOMY    . CYSTOSCOPY WITH RETROGRADE PYELOGRAM,  URETEROSCOPY AND STENT PLACEMENT Left 02/22/2014   Procedure: CYSTOSCOPY WITH RETROGRADE PYELOGRAM, URETEROSCOPY, STONE BASKET EXTRACTION ;  Surgeon: Kathi Ludwig, MD;  Location: [facility];  Service: Urology;  Laterality: Left;  . tailbone cyst    . TUBAL LIGATION      OB History   No obstetric history on file.      Home Medications    Prior to Admission medications   Medication Sig Start Date End Date Taking? Authorizing Provider  acyclovir (ZOVIRAX) 400 MG tablet Take 1 tablet (400 mg total) by mouth 2 (two) times daily. 02/07/20   Rolla Etienne, NP  albuterol (VENTOLIN HFA) 108 (90 Base) MCG/ACT inhaler Inhale 2 puffs into the lungs every 4 (four) hours as needed for wheezing or shortness of breath. 05/17/19   Georgetta Haber, NP  cetirizine (ZYRTEC) 10 MG tablet Take 1 tablet (10 mg total) by mouth daily. 02/07/20 03/08/20  Rolla Etienne, NP  diclofenac (VOLTAREN) 75 MG EC tablet Take 1 tablet (75 mg total) by mouth 2 (two) times daily. 08/06/19   Elson Areas, PA-C  fluticasone (FLONASE) 50 MCG/ACT nasal spray Place 1 spray into both nostrils daily. 02/07/20 02/06/21  Rolla Etienne, NP  hydrocortisone (ANUSOL-HC) 2.5 % rectal cream Place 1 application rectally 2 (two) times daily. 11/21/19   Sandre Kitty, MD  ibuprofen (ADVIL) 600 MG tablet Take 1 tablet (600 mg total) by mouth every 6 (six) hours  as needed. 08/13/19   McDonald, Mia A, PA-C  ipratropium (ATROVENT) 0.06 % nasal spray Place 2 sprays into both nostrils 3 (three) times daily. 05/17/19   Linus Mako B, NP  Lysine 1000 MG TABS Take 1 tablet (1,000 mg total) by mouth daily. 12/05/15   Uvaldo Rising, MD  montelukast (SINGULAIR) 10 MG tablet Take 1 tablet (10 mg total) by mouth at bedtime. 06/13/19   Shirley, [place], DO    Family History Family History  Problem Relation Age of Onset  . Arrhythmia Father        Pacemakers placed didn't take passed away 3 moths later  . Arrhythmia Sister        Pacemaker didn't take  to body passed away 3 months ;later  . Hypertension Maternal Aunt   . Heart failure Maternal Grandmother   . Hypertension Maternal Grandfather   . Diabetes Other   . CAD Other   . Heart Problems Mother   . Heart failure Mother     Social History Social History   Tobacco Use  . Smoking status: Current Every Day Smoker    Packs/day: 0.50    Types: Cigarettes    Start date: 11/19/2012  . Smokeless tobacco: Never Used  Vaping Use  . Vaping Use: Never used  Substance Use Topics  . Alcohol use: No  . Drug use: Yes    Types: Marijuana     Allergies   Doxycycline and Lorcet [hydrocodone-acetaminophen]   Review of Systems As stated in HPI otherwise negative   Physical Exam Triage Vital Signs ED Triage Vitals  Enc Vitals Group     BP 02/07/20 0900 (!) 168/87     Pulse Rate 02/07/20 0900 71     Resp 02/07/20 0900 16     Temp 02/07/20 0900 98.7 F (37.1 C)     Temp Source 02/07/20 0900 Oral     SpO2 02/07/20 0900 100 %     Weight --      Height --      Head Circumference --      Peak Flow --      Pain Score 02/07/20 0857 9     Pain Loc --      Pain Edu? --      Excl. in GC? --    No data found.  Updated Vital Signs BP (!) 168/87 (BP Location: Left Arm)   Pulse 71   Temp 98.7 F (37.1 C) (Oral)   Resp 16   LMP 01/08/2020   SpO2 100%   Physical Exam Constitutional:      General: She is not in acute distress.    Appearance: She is well-developed. She is not ill-appearing or toxic-appearing.  HENT:     Head:     Comments: No sinus tenderness    Right Ear: Tympanic membrane normal. Tympanic membrane is not erythematous.     Left Ear: Tympanic membrane normal. Tympanic membrane is not erythematous.     Nose: No congestion or rhinorrhea.     Mouth/Throat:     Mouth: Mucous membranes are moist. Oral lesions present.     Pharynx: Uvula midline. Posterior oropharyngeal erythema present. No oropharyngeal exudate.     Comments: Small erythematous lesions to  posterior pharynx.  No drainage or exudate.  1+ tonsillar swelling and diffuse erythema Pulmonary:     Effort: Pulmonary effort is normal.  Musculoskeletal:     Cervical back: Normal range of motion and neck supple.  Lymphadenopathy:  Cervical: No cervical adenopathy.  Skin:    General: Skin is warm and dry.  Neurological:     Mental Status: She is alert.  Psychiatric:        Mood and Affect: Mood normal.        Behavior: Behavior normal.     UC Treatments / Results  Labs (all labs ordered are listed, but only abnormal results are displayed) Labs Reviewed  CULTURE, GROUP A STREP ([facility])  SARS CORONAVIRUS 2 (TAT 6-24 HRS)  POCT RAPID STREP A, ED / UC    EKG   Radiology No results found.  Procedures Procedures (including critical care time)  Medications Ordered in UC Medications  acetaminophen (TYLENOL) tablet 650 mg (650 mg Oral Given 02/07/20 0937)  ketorolac (TORADOL) injection 60 mg (60 mg Intramuscular Given 02/07/20 1008)    Initial Impression / Assessment and Plan / UC Course  I have reviewed the triage vital signs and the nursing notes.  Pertinent labs & imaging results that were available during my care of the patient were reviewed by me and considered in my medical decision making (see chart for details).  Pharyngitis -exam c/w viral pharyngitis. Rapid strep neg. Will send for culture -no indication for antibiotic therapy right now -supportive tx c tylenol/Motrin, salt water gargles, warm liquids -screen for covid-19. Isolation precautions discussed COVID testing ordered. It will take between 3-7 days for test results. Someone will contact you regarding abnormal results or you can access your results through MyChart.   Questions answered. Outlined signs and symptoms indicating need for more acute intervention. Pt verbalized understanding. AVS given   Final Clinical Impressions(s) / UC Diagnoses   Final diagnoses:  Viral pharyngitis  Encounter for  screening for COVID-19  Elevated BP without diagnosis of hypertension     Discharge Instructions     Your rapid strep test was negative.  It has been sent for culture and we will call you if any treatment is needed.  I have also sent off a Covid swab.  Results should be available in the next 2 to 3 days on your MyChart.  Isolation precautions as we discussed.  You can return to work if [employer] test is negative.  If positive you will need to isolate for 10 days starting at day of symptom onset.  I would also like you to schedule an appointment to be seen in the next 1 to 2 weeks with [facility] regarding your blood pressure.  Please return for any worsening symptoms.  Gargling with salt water will help with symptoms.  Tylenol or Motrin as needed.  Warm liquids may also help.    ED Prescriptions    Medication Sig Dispense Auth. Provider   cetirizine (ZYRTEC) 10 MG tablet Take 1 tablet (10 mg total) by mouth daily. 30 tablet Rolla Etienne, NP   acyclovir (ZOVIRAX) 400 MG tablet Take 1 tablet (400 mg total) by mouth 2 (two) times daily. 60 tablet Rolla Etienne, NP   fluticasone (FLONASE) 50 MCG/ACT nasal spray Place 1 spray into both nostrils daily. 15 mL Rolla Etienne, NP     PDMP not reviewed this encounter.   Rolla Etienne, NP 02/07/20 1402

## 2020-02-07 NOTE — Discharge Instructions (Signed)
Your rapid strep test was negative.  It has been sent for culture and we will call you if any treatment is needed.  I have also sent off a Covid swab.  Results should be available in the next 2 to 3 days on your MyChart.  Isolation precautions as we discussed.  You can return to work if [employer] test is negative.  If positive you will need to isolate for 10 days starting at day of symptom onset.  I would also like you to schedule an appointment to be seen in the next 1 to 2 weeks with [facility] regarding your blood pressure.  Please return for any worsening symptoms.  Gargling with salt water will help with symptoms.  Tylenol or Motrin as needed.  Warm liquids may also help.

## 2020-02-09 LAB — CULTURE, GROUP A STREP (THRC)

## 2020-03-01 DIAGNOSIS — Z03818 Encounter for observation for suspected exposure to other biological agents ruled out: Secondary | ICD-10-CM | POA: Diagnosis not present

## 2020-03-13 ENCOUNTER — Encounter: Payer: Self-pay | Admitting: Family Medicine

## 2020-03-13 ENCOUNTER — Other Ambulatory Visit: Payer: Self-pay

## 2020-03-13 ENCOUNTER — Ambulatory Visit (INDEPENDENT_AMBULATORY_CARE_PROVIDER_SITE_OTHER): Payer: Medicaid Other | Admitting: Family Medicine

## 2020-03-13 VITALS — BP 128/74 | HR 62 | Ht 63.0 in | Wt 160.8 lb

## 2020-03-13 DIAGNOSIS — Z8616 Personal history of COVID-19: Secondary | ICD-10-CM | POA: Diagnosis not present

## 2020-03-13 DIAGNOSIS — I10 Essential (primary) hypertension: Secondary | ICD-10-CM

## 2020-03-13 MED ORDER — CETIRIZINE HCL 10 MG PO TABS: 10.0000 mg | ORAL_TABLET | Freq: Every day | ORAL | 0 refills | Status: DC

## 2020-03-13 MED ORDER — ALBUTEROL SULFATE HFA 108 (90 BASE) MCG/ACT IN AERS: 2.0000 | INHALATION_SPRAY | RESPIRATORY_TRACT | 0 refills | Status: DC | PRN

## 2020-03-13 MED ORDER — AMLODIPINE BESYLATE 5 MG PO TABS: 5.0000 mg | ORAL_TABLET | Freq: Every day | ORAL | 0 refills | Status: DC

## 2020-03-13 MED ORDER — FLUTICASONE PROPIONATE 50 MCG/ACT NA SUSP: 2.0000 | Freq: Every day | NASAL | 0 refills | Status: DC

## 2020-03-13 NOTE — Progress Notes (Signed)
    SUBJECTIVE:   CHIEF COMPLAINT / HPI:   F/U COVID Still having difficulty with deep breathing and feels like she is breathing through cotton Gets worse at night and in the morning, but does okay during the day Tested Positive for COVID on 12/30  Had congestion, some wheezing that improved with albuterol inhaler, fever for 2 days, none since, fatigue Started back smoking about 1.5 yrs ago, about 2-3 cigarettes a day now Has allergies and asthma Take flonase Uses albuterol as needed  Has never needed a controller inhaler Takes zyrtec, flonase daily Needs refills  Elevated BP Has been taking BP at home, has been in 130s, highest DBP is 89 Has been told at other provider visit that she has elevated Bps, on 12/8 was 168/87, on 9/10 was 164/98, multiple other occasions >140/90 Has a family history of HTN and heart problems and wants to make sure that she is doing the right thing and taking care of herself  PERTINENT  PMH / PSH: Migraine, Depression, Asthma, Tobacco Use Disorder, PTSD, Asthma  OBJECTIVE:   BP 128/74   Pulse 62   Ht 5\' 3"  (1.6 m)   Wt 160 lb 12.8 oz (72.9 kg)   SpO2 96%   BMI 28.48 kg/m    Physical Exam:  General: 45 y.o. female in NAD HEENT: nares clear b/l, throat clear Cardio: RRR no m/r/g Lungs: CTAB, no wheezing, no rhonchi, no crackles, no IWOB on RA Skin: warm and dry Extremities: No edema   ASSESSMENT/PLAN:   Hypertension Given numerous elevated values and report values at home that are above goal, will start norvasc 5mg  today.  Will also obtain BMP.  Encourage proper diet and smoking cessation.  F/U with PCP in 1 month.  History of COVID-19 Lungs clear on exam.  Advised that progression is not outside of normal for COVID.  Can continue to use flonase, zyrtec, and albuterol prn.  Refills provided for these.  If no improvement in next few weeks, or if worsening, development of fever, shortness of breath, RTC.  F/U 1 month.   Encouraged COVID  booster, but she wants to wait until a Friday.  , DO [facility]

## 2020-03-13 NOTE — Assessment & Plan Note (Signed)
Lungs clear on exam.  Advised that progression is not outside of normal for COVID.  Can continue to use flonase, zyrtec, and albuterol prn.  Refills provided for these.  If no improvement in next few weeks, or if worsening, development of fever, shortness of breath, RTC.  F/U 1 month.

## 2020-03-13 NOTE — Patient Instructions (Signed)
Thank you for coming to see me today. It was a pleasure. Today we talked about:   We will get some labs today.  If they are abnormal or we need to do something about them, I will call you.  If they are normal, I will send you a message on MyChart (if it is active) or a letter in the mail.  If you don't hear from [place] in 2 weeks, please call the office at the number below.  Aim for 5 servings of veggies a day.  Please follow-up with me or PCP in 1 month.  If you have any questions or concerns, please do not hesitate to call the office at [phone].  Best,   Luis Abed, DO

## 2020-03-13 NOTE — Assessment & Plan Note (Signed)
Given numerous elevated values and report values at home that are above goal, will start norvasc 5mg  today.  Will also obtain BMP.  Encourage proper diet and smoking cessation.  F/U with PCP in 1 month.

## 2020-03-14 LAB — BASIC METABOLIC PANEL
BUN/Creatinine Ratio: 13 (ref 9–23)
BUN: 14 mg/dL (ref 6–24)
CO2: 23 mmol/L (ref 20–29)
Calcium: 8.8 mg/dL (ref 8.7–10.2)
Chloride: 103 mmol/L (ref 96–106)
Creatinine, Ser: 1.04 mg/dL — ABNORMAL HIGH (ref 0.57–1.00)
GFR calc Af Amer: 77 mL/min/{1.73_m2} (ref >59)
GFR calc non Af Amer: 66 mL/min/{1.73_m2} (ref >59)
Glucose: 87 mg/dL (ref 65–99)
Potassium: 4.3 mmol/L (ref 3.5–5.2)
Sodium: 140 mmol/L (ref 134–144)

## 2020-04-08 ENCOUNTER — Ambulatory Visit: Payer: Medicaid Other | Admitting: Family Medicine

## 2020-04-08 NOTE — Progress Notes (Deleted)
    SUBJECTIVE:   CHIEF COMPLAINT / HPI:   Hypertension follow-up Current regimen: Norvasc 5 mg daily, started on 03/13/2020 BMP also obtained at that visit, creatinine slightly increased at 1.04 ***  COVID booster ***  PERTINENT  PMH / PSH: ***  OBJECTIVE:   There were no vitals taken for this visit.   Physical Exam: *** General: 45 y.o. female in NAD Cardio: RRR no m/r/g Lungs: CTAB, no wheezing, no rhonchi, no crackles, no IWOB on *** Abdomen: Soft, non-tender to palpation, non-distended, positive bowel sounds Skin: warm and dry Extremities: No edema   ASSESSMENT/PLAN:   No problem-specific Assessment & Plan notes found for this encounter.     Gail Jim, DO [facility]

## 2020-04-09 NOTE — Patient Instructions (Signed)
It was wonderful to meet you today. Thank you for allowing me to be a part of your care. Below is a short summary of what we discussed at your visit today:  Urinary issues -Blood work today -Ultrasound of bladder and kidneys -Follow-up with [place] in 2 weeks -Referral to urogynecology for definitive diagnosis and management, possibly diagnostic imaging  Blood pressure medication -Amlodipine is completely safe for the kidneys, will not cause kidney injury -You may start taking it as prescribed -Let us know with a MyChart message or call if you develop any side effects    If you have any questions or concerns, please do not hesitate to contact us via phone or MyChart message.   Fayette Pho, MD

## 2020-04-10 ENCOUNTER — Other Ambulatory Visit: Payer: Self-pay

## 2020-04-10 ENCOUNTER — Encounter: Payer: Self-pay | Admitting: Family Medicine

## 2020-04-10 ENCOUNTER — Ambulatory Visit (INDEPENDENT_AMBULATORY_CARE_PROVIDER_SITE_OTHER): Payer: Medicaid Other | Admitting: Family Medicine

## 2020-04-10 VITALS — BP 140/82 | HR 80 | Ht 63.0 in | Wt 161.2 lb

## 2020-04-10 DIAGNOSIS — R7989 Other specified abnormal findings of blood chemistry: Secondary | ICD-10-CM

## 2020-04-10 DIAGNOSIS — R350 Frequency of micturition: Secondary | ICD-10-CM

## 2020-04-10 DIAGNOSIS — N3946 Mixed incontinence: Secondary | ICD-10-CM | POA: Diagnosis not present

## 2020-04-10 DIAGNOSIS — I10 Essential (primary) hypertension: Secondary | ICD-10-CM

## 2020-04-10 DIAGNOSIS — N941 Unspecified dyspareunia: Secondary | ICD-10-CM

## 2020-04-10 LAB — POCT URINALYSIS DIP (MANUAL ENTRY)
Bilirubin, UA: NEGATIVE
Glucose, UA: NEGATIVE mg/dL
Ketones, POC UA: NEGATIVE mg/dL
Nitrite, UA: NEGATIVE
Protein Ur, POC: NEGATIVE mg/dL
Spec Grav, UA: 1.015 (ref 1.010–1.025)
Urobilinogen, UA: 0.2 E.U./dL
pH, UA: 6 (ref 5.0–8.0)

## 2020-04-10 NOTE — Progress Notes (Signed)
    SUBJECTIVE:   CHIEF COMPLAINT / HPI:   Urinary issues, dyspareunia [name] reports issues with urinary incontinence and retention: - urinary symptoms and dyspareunia started 2020  - at onset, no obvious med/health changes, no trauma/accidents, etc - pain with deep penetration: previously had transvag US 08/2019 due to concern for endometriosis, results normal - always leaking "dribbling" urine, needs panty liner - urinates frequently, even when restricting fluid intake (yesterday urinated 16 times) - currently drinks 5-6 water bottles, 16 ounces each - feels she does not fully empty bladder - can feel bladder spasming - since 2020, 3 episodes of gross urinary incontinence, uncontrollable: once at bus stop, once while sleeping - when urinating cannot stop stream - does kegel exercises regularly - previous history of kidney stone, ureter stent placed 2015 - current smoker, 5-6 cig/day, duration 27 years off and on  HTN, new medication Gail Bass was previously prescribed amlodipine for her hypertension. She has not yet started it out of concern for her kidney function. She wants to speak with the physician prior to taking to ensure this will not harm her kidneys.   PERTINENT  PMH / PSH: HTN, depression, intermittent asthma, menorrhagia, tobacco use, PTSD, external hemorrhoids, migraine, history of COVID-19  OBJECTIVE:   BP 140/82   Pulse 80   Ht 5\' 3"  (1.6 m)   Wt 161 lb 3.2 oz (73.1 kg)   SpO2 98%   BMI 28.56 kg/m   PHQ-9:  Depression screen [facility] 2/9 04/10/2020 11/21/2019 08/07/2019  Decreased Interest 0 0 0  Down, Depressed, Hopeless 0 1 0  PHQ - 2 Score 0 1 0  Altered sleeping 0 0 -  Tired, decreased energy 1 0 -  Change in appetite 0 0 -  Feeling bad or failure about yourself  0 0 -  Trouble concentrating 0 0 -  Moving slowly or fidgety/restless 0 0 -  Suicidal thoughts 0 0 -  PHQ-9 Score 1 1 -  Difficult doing work/chores Not difficult at all - -     GAD-7:   GAD 7 : Generalized Anxiety Score 04/25/2018  Nervous, Anxious, on Edge 0  Control/stop worrying 1  Worry too much - different things 1  Trouble relaxing 0  Restless 0  Easily annoyed or irritable 0  Afraid - awful might happen 0  Total GAD 7 Score 2     Physical Exam General: Awake, alert, oriented, no acute distress Respiratory: Unlabored respirations, speaking in full sentences, no respiratory distress Extremities: Moving all extremities spontaneously Neuro: Cranial nerves II through X grossly intact Psych: Normal insight and judgement   ASSESSMENT/PLAN:   Mixed stress and urge urinary incontinence - UA negative for infection today - BMP today to re-assess kidney function, Cr increased slightly last month - 04/27/2018 of bladder and kidneys - 2 week f/u with plan for gyn exam at that time - referral to urogyn - no meds at this time due to unclear, mixed clinical presentation  Hypertension Counseled patient that amlodipine prescribed at last visit is completely safe for kidneys.  - check BP response at 2 week f/u     [place], MD [facility]

## 2020-04-11 DIAGNOSIS — N3946 Mixed incontinence: Secondary | ICD-10-CM | POA: Insufficient documentation

## 2020-04-11 LAB — BASIC METABOLIC PANEL
BUN/Creatinine Ratio: 15 (ref 9–23)
BUN: 11 mg/dL (ref 6–24)
CO2: 23 mmol/L (ref 20–29)
Calcium: 9.4 mg/dL (ref 8.7–10.2)
Chloride: 103 mmol/L (ref 96–106)
Creatinine, Ser: 0.71 mg/dL (ref 0.57–1.00)
GFR calc Af Amer: 121 mL/min/{1.73_m2} (ref >59)
GFR calc non Af Amer: 105 mL/min/{1.73_m2} (ref >59)
Glucose: 88 mg/dL (ref 65–99)
Potassium: 4.1 mmol/L (ref 3.5–5.2)
Sodium: 140 mmol/L (ref 134–144)

## 2020-04-11 NOTE — Assessment & Plan Note (Signed)
-   UA negative for infection today - BMP today to re-assess kidney function, Cr increased slightly last month - [place] of bladder and kidneys - 2 week f/u with plan for gyn exam at that time - referral to urogyn - no meds at this time due to unclear, mixed clinical presentation

## 2020-04-11 NOTE — Assessment & Plan Note (Signed)
Counseled patient that amlodipine prescribed at last visit is completely safe for kidneys.  - check BP response at 2 week f/u

## 2020-04-22 ENCOUNTER — Other Ambulatory Visit: Payer: Self-pay

## 2020-04-22 ENCOUNTER — Ambulatory Visit (HOSPITAL_COMMUNITY): Payer: Medicaid Other

## 2020-04-22 ENCOUNTER — Ambulatory Visit (HOSPITAL_COMMUNITY)
Admission: RE | Admit: 2020-04-22 | Discharge: 2020-04-22 | Disposition: A | Discharge status: Home / Self Care | Payer: Medicaid Other | Source: Ambulatory Visit | Attending: Family Medicine | Admitting: Family Medicine

## 2020-04-22 DIAGNOSIS — N941 Unspecified dyspareunia: Secondary | ICD-10-CM | POA: Insufficient documentation

## 2020-04-22 DIAGNOSIS — R350 Frequency of micturition: Secondary | ICD-10-CM | POA: Diagnosis not present

## 2020-04-22 DIAGNOSIS — N3946 Mixed incontinence: Secondary | ICD-10-CM | POA: Diagnosis present

## 2020-04-22 DIAGNOSIS — R32 Unspecified urinary incontinence: Secondary | ICD-10-CM | POA: Diagnosis not present

## 2020-04-22 DIAGNOSIS — Z87448 Personal history of other diseases of urinary system: Secondary | ICD-10-CM | POA: Diagnosis not present

## 2020-04-22 DIAGNOSIS — R7989 Other specified abnormal findings of blood chemistry: Secondary | ICD-10-CM | POA: Diagnosis present

## 2020-04-22 DIAGNOSIS — N200 Calculus of kidney: Secondary | ICD-10-CM | POA: Diagnosis not present

## 2020-04-25 ENCOUNTER — Ambulatory Visit (INDEPENDENT_AMBULATORY_CARE_PROVIDER_SITE_OTHER): Payer: Medicaid Other | Admitting: Family Medicine

## 2020-04-25 ENCOUNTER — Other Ambulatory Visit: Payer: Self-pay

## 2020-04-25 ENCOUNTER — Encounter: Payer: Self-pay | Admitting: Family Medicine

## 2020-04-25 VITALS — BP 140/85 | HR 67 | Ht 63.0 in | Wt 161.0 lb

## 2020-04-25 DIAGNOSIS — N3946 Mixed incontinence: Secondary | ICD-10-CM

## 2020-04-25 DIAGNOSIS — I10 Essential (primary) hypertension: Secondary | ICD-10-CM

## 2020-04-25 DIAGNOSIS — R300 Dysuria: Secondary | ICD-10-CM

## 2020-04-25 DIAGNOSIS — R109 Unspecified abdominal pain: Secondary | ICD-10-CM | POA: Diagnosis not present

## 2020-04-25 LAB — POCT URINALYSIS DIP (MANUAL ENTRY)
Bilirubin, UA: NEGATIVE
Glucose, UA: NEGATIVE mg/dL
Ketones, POC UA: NEGATIVE mg/dL
Leukocytes, UA: NEGATIVE
Nitrite, UA: NEGATIVE
Protein Ur, POC: NEGATIVE mg/dL
Spec Grav, UA: 1.01 (ref 1.010–1.025)
Urobilinogen, UA: 0.2 E.U./dL
pH, UA: 6 (ref 5.0–8.0)

## 2020-04-25 NOTE — Progress Notes (Signed)
    SUBJECTIVE:   CHIEF COMPLAINT / HPI:   Hypertension medication Has not yet begun amlodipine as prescribed by Dr. Mauri Reading in January. She reports not starting it because she wanted to make sure this was safe for her kidneys given her ongoing urinary problems that we are working up.  This visit was supposed to be a 2-week follow-up of her blood pressure in response to the amlodipine.  As she has not started it yet, we will simply defer this to later.  She reports being hesitant to start a medication, she would like to be on as few medications as possible.  Urinary issues Was referred to the urogynecologist at last visit for mixed urinary incontinence, dyspareunia.  Has appointment with them at the end of March.  Renal ultrasound obtained 04/22/2020 demonstrates a nonobstructing 4 mm calculus of the mid right kidney, along with mild fullness of each renal collecting system without obstructing focus or ureterectasis.   PERTINENT  PMH / PSH: Hypertension, migraine, depression, intermittent asthma, tobacco use, PTSD, cold sores  OBJECTIVE:   BP 140/85   Pulse 67   Ht 5\' 3"  (1.6 m)   Wt 161 lb (73 kg)   LMP 04/16/2020 (Exact Date)   SpO2 100%   BMI 28.52 kg/m   PHQ-9:  Depression screen [facility] 2/9 04/25/2020 04/10/2020 11/21/2019  Decreased Interest 0 0 0  Down, Depressed, Hopeless 0 0 1  PHQ - 2 Score 0 0 1  Altered sleeping 0 0 0  Tired, decreased energy 1 1 0  Change in appetite 0 0 0  Feeling bad or failure about yourself  0 0 0  Trouble concentrating 0 0 0  Moving slowly or fidgety/restless 0 0 0  Suicidal thoughts 0 0 0  PHQ-9 Score 1 1 1   Difficult doing work/chores Somewhat difficult Not difficult at all -     GAD-7:  GAD 7 : Generalized Anxiety Score 04/25/2018  Nervous, Anxious, on Edge 0  Control/stop worrying 1  Worry too much - different things 1  Trouble relaxing 0  Restless 0  Easily annoyed or irritable 0  Afraid - awful might happen 0  Total GAD 7 Score 2      Physical Exam General: Awake, alert, oriented, no acute distress Respiratory: Unlabored respirations, speaking in full sentences, no respiratory distress Extremities: Moving all extremities spontaneously Neuro: Cranial nerves II through X grossly intact Psych: Normal insight and judgement   ASSESSMENT/PLAN:   Hypertension BP 140/85 today. Advised patient to obtain home BP cuff and measure every couple days. Bring in record of readings to assess at next visit. May not need anti-hypertensive.   Mixed stress and urge urinary incontinence Scheduled 3/29 with Dr. 04/27/2018, 4/29. No new urinary complaints. Kidney stone small and non-obstructing. May account for flank pain but not mixed urinary incontinence. Appreciate urogyn evaluation and treatment.      Florian Buff, MD [facility]

## 2020-04-25 NOTE — Patient Instructions (Signed)
It was wonderful to see you today. Thank you for allowing me to be a part of your care. Below is a short summary of what we discussed at your visit today:  Blood pressure Get a blood pressure cuff for your house from your local pharmacy or [employer].  The measurements at home may be couple times a day or once a day and write down the measurement.  Bring it back to next appointment.  We will see if her blood pressure is just is high at home or if it is mostly high at the doctor's office.  Urinary symptoms -Urine sample today just to recheck to make sure nothing has developed further -Keep the urogynecology appointment at the end of March   Follow-up in 2 to 3 weeks   Please bring all of your medications to every appointment!  If you have any questions or concerns, please do not hesitate to contact us via phone or MyChart message.   Fayette Pho, MD

## 2020-04-26 LAB — URINALYSIS, MICROSCOPIC ONLY
Bacteria, UA: NONE SEEN
Casts: NONE SEEN /lpf
Epithelial Cells (non renal): NONE SEEN /hpf (ref 0–10)
WBC, UA: NONE SEEN /hpf (ref 0–5)

## 2020-04-28 NOTE — Assessment & Plan Note (Signed)
BP 140/85 today. Advised patient to obtain home BP cuff and measure every couple days. Bring in record of readings to assess at next visit. May not need anti-hypertensive.

## 2020-04-28 NOTE — Assessment & Plan Note (Signed)
Scheduled 3/29 with Dr. Florian Buff, Berneta Levins. No new urinary complaints. Kidney stone small and non-obstructing. May account for flank pain but not mixed urinary incontinence. Appreciate urogyn evaluation and treatment.

## 2020-05-17 ENCOUNTER — Encounter (HOSPITAL_COMMUNITY): Payer: Self-pay | Admitting: Emergency Medicine

## 2020-05-17 ENCOUNTER — Ambulatory Visit (HOSPITAL_COMMUNITY)
Admission: EM | Admit: 2020-05-17 | Discharge: 2020-05-17 | Disposition: A | Discharge status: Home / Self Care | Payer: Medicaid Other | Attending: Emergency Medicine | Admitting: Emergency Medicine

## 2020-05-17 ENCOUNTER — Other Ambulatory Visit: Payer: Self-pay

## 2020-05-17 ENCOUNTER — Ambulatory Visit (INDEPENDENT_AMBULATORY_CARE_PROVIDER_SITE_OTHER): Payer: Medicaid Other

## 2020-05-17 DIAGNOSIS — R059 Cough, unspecified: Secondary | ICD-10-CM

## 2020-05-17 MED ORDER — PREDNISONE 50 MG PO TABS
ORAL_TABLET | ORAL | 0 refills | Status: DC
Start: 2020-05-17 — End: 2020-05-28

## 2020-05-17 MED ORDER — AZITHROMYCIN 250 MG PO TABS
250.0000 mg | ORAL_TABLET | Freq: Every day | ORAL | 0 refills | Status: DC
Start: 2020-05-17 — End: 2020-05-28

## 2020-05-17 MED ORDER — ALBUTEROL SULFATE HFA 108 (90 BASE) MCG/ACT IN AERS: 2.0000 | INHALATION_SPRAY | RESPIRATORY_TRACT | 0 refills | Status: DC | PRN

## 2020-05-17 NOTE — ED Provider Notes (Signed)
[facility]  ____________________________________________  Time seen: Approximately 3:44 PM  I have reviewed the triage vital signs and the nursing notes.   HISTORY  Chief Complaint Cough   Historian Patient     HPI Gail Bass is a 45 y.o. female presents to the urgent care with 10 days of nonproductive cough, nasal congestion, headache and malaise.  Patient states that she has cough productive for purulent sputum production.  She had a call out of work today and slept most of the day she states that she typically gets bronchitis once a year but she has had a longer recovery than usual.  Patient has been using her albuterol inhaler 2 times a day.  No fever at home.  No other alleviating measures have been attempted.   Past Medical History:  Diagnosis Date  . Asthma   . Bacterial vaginosis   . BV (bacterial vaginosis) 09/13/2008       . Hypertension 05/11/2013  . Kidney stone   . Pain in the chest 06/06/2015  . Painful lumpy right breast 03/21/2013  . Recurrent sinusitis 06/06/2015  . TUBAL PREGNANCY 09/13/2008   Qualifier: History of  By: Lanier Prude  MD, Cathrine Muster    . Vaginal discharge 01/10/2014     Immunizations up to date:  Yes.     Past Medical History:  Diagnosis Date  . Asthma   . Bacterial vaginosis   . BV (bacterial vaginosis) 09/13/2008       . Hypertension 05/11/2013  . Kidney stone   . Pain in the chest 06/06/2015  . Painful lumpy right breast 03/21/2013  . Recurrent sinusitis 06/06/2015  . TUBAL PREGNANCY 09/13/2008   Qualifier: History of  By: Lanier Prude  MD, Cathrine Muster    . Vaginal discharge 01/10/2014    Patient Active Problem List   Diagnosis Date Noted  . Mixed stress and urge urinary incontinence 04/11/2020  . History of COVID-19 03/13/2020  . Abscess of buttock 08/13/2019  . Boil 06/14/2019  . Seasonal allergies 06/14/2019  . Sinus congestion 04/25/2018  . Recurrent cold sores 12/05/2015  . PTSD (post-traumatic stress disorder)  12/05/2015  . External hemorrhoids 08/07/2015  . Vaginal discharge 01/10/2014  . Hypertension 05/11/2013  . Tobacco use disorder 03/21/2013  . ASTHMA, INTERMITTENT 12/23/2009  . OVARIAN CYST, LEFT 10/29/2008  . MIGRAINE HEADACHE 09/13/2008  . MENORRHAGIA 09/13/2008  . Depression 06/21/2008    Past Surgical History:  Procedure Laterality Date  . CYSTECTOMY    . CYSTOSCOPY WITH RETROGRADE PYELOGRAM, URETEROSCOPY AND STENT PLACEMENT Left 02/22/2014   Procedure: CYSTOSCOPY WITH RETROGRADE PYELOGRAM, URETEROSCOPY, STONE BASKET EXTRACTION ;  Surgeon: Kathi Ludwig, MD;  Location: [facility];  Service: Urology;  Laterality: Left;  . tailbone cyst    . TUBAL LIGATION      Prior to Admission medications   Medication Sig Start Date End Date Taking? Authorizing Provider  azithromycin (ZITHROMAX) 250 MG tablet Take 1 tablet (250 mg total) by mouth daily. Take first 2 tablets together, then 1 every day until finished. 05/17/20  Yes Pia Mau M, PA-C  predniSONE (DELTASONE) 50 MG tablet Take one tablet once daily for the next five days. 05/17/20  Yes Pia Mau M, PA-C  acyclovir (ZOVIRAX) 400 MG tablet Take 1 tablet (400 mg total) by mouth 2 (two) times daily. 02/07/20   Rolla Etienne, NP  albuterol (VENTOLIN HFA) 108 (90 Base) MCG/ACT inhaler Inhale 2 puffs into the lungs every 4 (four) hours as needed for wheezing or shortness  of breath. 05/17/20   Orvil Feil, PA-C  amLODipine (NORVASC) 5 MG tablet Take 1 tablet (5 mg total) by mouth at bedtime. 03/13/20   Meccariello, Solmon Ice, DO  cetirizine (ZYRTEC) 10 MG tablet Take 1 tablet (10 mg total) by mouth daily. 03/13/20 04/12/20  Meccariello, Solmon Ice, DO  fluticasone (FLONASE) 50 MCG/ACT nasal spray Place 2 sprays into both nostrils daily. 03/13/20 03/13/21  Meccariello, Solmon Ice, DO  hydrocortisone (ANUSOL-HC) 2.5 % rectal cream Place 1 application rectally 2 (two) times daily. 11/21/19   Sandre Kitty, MD  ibuprofen (ADVIL) 600 MG tablet  Take 1 tablet (600 mg total) by mouth every 6 (six) hours as needed. 08/13/19   McDonald, Mia A, PA-C  ipratropium (ATROVENT) 0.06 % nasal spray Place 2 sprays into both nostrils 3 (three) times daily. 05/17/19   Linus Mako B, NP  Lysine 1000 MG TABS Take 1 tablet (1,000 mg total) by mouth daily. 12/05/15   Uvaldo Rising, MD  montelukast (SINGULAIR) 10 MG tablet Take 1 tablet (10 mg total) by mouth at bedtime. 06/13/19   Shirley, [place], DO    Allergies Doxycycline and Lorcet [hydrocodone-acetaminophen]  Family History  Problem Relation Age of Onset  . Arrhythmia Father        Pacemakers placed didn't take passed away 3 moths later  . Arrhythmia Sister        Pacemaker didn't take to body passed away 3 months ;later  . Hypertension Maternal Aunt   . Heart failure Maternal Grandmother   . Hypertension Maternal Grandfather   . Diabetes Other   . CAD Other   . Heart Problems Mother   . Heart failure Mother     Social History Social History   Tobacco Use  . Smoking status: Former Smoker    Packs/day: 0.50    Types: Cigarettes    Start date: 11/19/2012  . Smokeless tobacco: Never Used  Vaping Use  . Vaping Use: Never used  Substance Use Topics  . Alcohol use: No  . Drug use: Yes    Types: Marijuana     Review of Systems  Constitutional: No fever/chills Eyes:  No discharge ENT: No upper respiratory complaints. Respiratory: Patient has cough and nasal congestion.  Gastrointestinal:   No nausea, no vomiting.  No diarrhea.  No constipation. Musculoskeletal: Negative for musculoskeletal pain. Skin: Negative for rash, abrasions, lacerations, ecchymosis.  .  ____________________________________________   PHYSICAL EXAM:  VITAL SIGNS: ED Triage Vitals [05/17/20 1527]  Enc Vitals Group     BP (!) 149/93     Pulse Rate 87     Resp 18     Temp 98.5 F (36.9 C)     Temp Source Oral     SpO2 97 %     Weight      Height      Head Circumference      Peak Flow       Pain Score      Pain Loc      Pain Edu?      Excl. in GC?     Constitutional: Alert and oriented. Patient is lying supine. Eyes: Conjunctivae are normal. PERRL. EOMI. Head: Atraumatic. ENT:      Ears: Tympanic membranes are mildly injected with mild effusion bilaterally.       Nose: No congestion/rhinnorhea.      Mouth/Throat: Mucous membranes are moist. Posterior pharynx is mildly erythematous.  Hematological/Lymphatic/Immunilogical: No cervical lymphadenopathy.  Cardiovascular: Normal rate, regular rhythm.  Normal S1 and S2.  Good peripheral circulation. Respiratory: Normal respiratory effort without tachypnea or retractions. Lungs CTAB. Good air entry to the bases with no decreased or absent breath sounds. Gastrointestinal: Bowel sounds 4 quadrants. Soft and nontender to palpation. No guarding or rigidity. No palpable masses. No distention. No CVA tenderness. Musculoskeletal: Full range of motion to all extremities. No gross deformities appreciated. Neurologic:  Normal speech and language. No gross focal neurologic deficits are appreciated.  Skin:  Skin is warm, dry and intact. No rash noted. Psychiatric: Mood and affect are normal. Speech and behavior are normal. Patient exhibits appropriate insight and judgement.   ____________________________________________   LABS (all labs ordered are listed, but only abnormal results are displayed)  Labs Reviewed - No data to display ____________________________________________  EKG   ____________________________________________  RADIOLOGY Geraldo Pitter, personally viewed and evaluated these images (plain radiographs) as part of my medical decision making, as well as reviewing the written report by the radiologist.    DG Chest 2 View  Result Date: 05/17/2020 CLINICAL DATA:  Cough for the past 10 days. EXAM: CHEST - 2 VIEW COMPARISON:  Chest x-ray dated May 17, 2019. FINDINGS: The heart size and mediastinal contours are within  normal limits. Both lungs are clear. The visualized skeletal structures are unremarkable. IMPRESSION: No active cardiopulmonary disease. Electronically Signed   By: Obie Dredge M.D.   On: 05/17/2020 16:12    ____________________________________________    PROCEDURES  Procedure(s) performed:     Procedures     Medications - No data to display   ____________________________________________   INITIAL IMPRESSION / ASSESSMENT AND PLAN / ED COURSE  Pertinent labs & imaging results that were available during my care of the patient were reviewed by me and considered in my medical decision making (see chart for details).      Assessment and Plan:  Cough 45 year old female presents to the urgent care with 10 days productive cough, shortness of breath, wheezing and nasal congestion.  Patient was hypertensive at triage but vital signs were otherwise reassuring.  No consolidations, opacities or infiltrates on chest x-ray.  Treated patient with azithromycin and prednisone and recommended Mucinex at night before bed to help with nasal congestion.  Patient's albuterol inhaler was refilled prior to discharge.  All patient questions were answered.    ____________________________________________  FINAL CLINICAL IMPRESSION(S) / ED DIAGNOSES  Final diagnoses:  Cough      NEW MEDICATIONS STARTED DURING THIS VISIT:  ED Discharge Orders         Ordered    azithromycin (ZITHROMAX) 250 MG tablet  Daily        05/17/20 1617    predniSONE (DELTASONE) 50 MG tablet        05/17/20 1617    albuterol (VENTOLIN HFA) 108 (90 Base) MCG/ACT inhaler  Every 4 hours PRN        05/17/20 1617              This chart was dictated using voice recognition software/Dragon. Despite best efforts to proofread, errors can occur which can change the meaning. Any change was purely unintentional.     Orvil Feil, PA-C 05/17/20 1621

## 2020-05-17 NOTE — ED Triage Notes (Addendum)
Patient presents to [facility] for assessment of 10 days of URI type symptoms, with cough, nasal congestion, post nasal drip.  Patient states in the past few days she has become more hoarse, and is feeling a "rattling" in her chest.  Patient states she is having to use her rescue inhaler x 2 today, and chest pressure, especially with coughing.  Home COVID test negative 2 days ago

## 2020-05-17 NOTE — Discharge Instructions (Signed)
Take azithromycin and prednisone as directed.
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
## 2020-05-28 ENCOUNTER — Other Ambulatory Visit: Payer: Self-pay

## 2020-05-28 ENCOUNTER — Encounter: Payer: Self-pay | Admitting: Obstetrics and Gynecology

## 2020-05-28 ENCOUNTER — Ambulatory Visit (INDEPENDENT_AMBULATORY_CARE_PROVIDER_SITE_OTHER): Payer: Medicaid Other | Admitting: Obstetrics and Gynecology

## 2020-05-28 VITALS — BP 145/105 | HR 75 | Ht 63.0 in | Wt 162.0 lb

## 2020-05-28 DIAGNOSIS — N920 Excessive and frequent menstruation with regular cycle: Secondary | ICD-10-CM | POA: Diagnosis not present

## 2020-05-28 DIAGNOSIS — N200 Calculus of kidney: Secondary | ICD-10-CM | POA: Diagnosis not present

## 2020-05-28 DIAGNOSIS — N941 Unspecified dyspareunia: Secondary | ICD-10-CM

## 2020-05-28 DIAGNOSIS — M62838 Other muscle spasm: Secondary | ICD-10-CM | POA: Diagnosis not present

## 2020-05-28 DIAGNOSIS — N393 Stress incontinence (female) (male): Secondary | ICD-10-CM | POA: Diagnosis not present

## 2020-05-28 DIAGNOSIS — R350 Frequency of micturition: Secondary | ICD-10-CM

## 2020-05-28 DIAGNOSIS — N3281 Overactive bladder: Secondary | ICD-10-CM

## 2020-05-28 LAB — POCT URINALYSIS DIPSTICK
Appearance: NORMAL
Bilirubin, UA: NEGATIVE
Blood, UA: NEGATIVE
Glucose, UA: NEGATIVE
Ketones, UA: NEGATIVE
Leukocytes, UA: NEGATIVE
Nitrite, UA: NEGATIVE
Protein, UA: NEGATIVE
Spec Grav, UA: 1.01 (ref 1.010–1.025)
Urobilinogen, UA: 0.2 E.U./dL
pH, UA: 7 (ref 5.0–8.0)

## 2020-05-28 MED ORDER — SOLIFENACIN SUCCINATE 5 MG PO TABS: 5.0000 mg | ORAL_TABLET | Freq: Every day | ORAL | 5 refills | Status: DC

## 2020-05-28 NOTE — Patient Instructions (Signed)
We discussed the symptoms of overactive bladder (OAB), which include urinary urgency, urinary frequency, night-time urination, with or without urge incontinence.  We discussed management including behavioral therapy (decreasing bladder irritants by following a bladder diet, urge suppression strategies, timed voids, bladder retraining), physical therapy, medication; and for refractory cases posterior tibial nerve stimulation, sacral neuromodulation, and intravesical botulinum toxin injection.   For anticholinergic medications, we discussed the potential side effects of anticholinergics including dry eyes, dry mouth, constipation, rare risks of cognitive impairment and urinary retention. You were given Vesicare 5mg  daily.   It can take a month to start working so give it time, but if you have bothersome side effects call sooner and we can try a different medication.  Call if you have trouble filling the prescription or if it's not covered by your insurance.   For treatment of stress urinary incontinence, which is leakage with physical activity/movement/strainging/coughing, we discussed expectant management versus nonsurgical options versus surgery. Nonsurgical options include weight loss, physical therapy, as well as a pessary.  Surgical options include a midurethral sling, which is a synthetic mesh sling that acts like a hammock under the urethra to prevent leakage of urine, and transurethral injection of a bulking agent.

## 2020-05-31 ENCOUNTER — Ambulatory Visit: Payer: Medicaid Other | Admitting: Family Medicine

## 2020-06-18 ENCOUNTER — Other Ambulatory Visit: Payer: Self-pay | Admitting: Family Medicine

## 2020-06-18 DIAGNOSIS — I10 Essential (primary) hypertension: Secondary | ICD-10-CM

## 2020-06-28 ENCOUNTER — Ambulatory Visit: Payer: Medicaid Other | Admitting: Obstetrics and Gynecology

## 2020-07-01 ENCOUNTER — Ambulatory Visit (INDEPENDENT_AMBULATORY_CARE_PROVIDER_SITE_OTHER): Payer: Medicaid Other | Admitting: Family Medicine

## 2020-07-01 ENCOUNTER — Other Ambulatory Visit: Payer: Self-pay

## 2020-07-01 ENCOUNTER — Encounter: Payer: Self-pay | Admitting: Family Medicine

## 2020-07-01 DIAGNOSIS — I10 Essential (primary) hypertension: Secondary | ICD-10-CM

## 2020-07-01 DIAGNOSIS — N3281 Overactive bladder: Secondary | ICD-10-CM | POA: Diagnosis present

## 2020-07-01 DIAGNOSIS — M62838 Other muscle spasm: Secondary | ICD-10-CM

## 2020-07-01 DIAGNOSIS — N941 Unspecified dyspareunia: Secondary | ICD-10-CM | POA: Diagnosis not present

## 2020-07-01 NOTE — Progress Notes (Signed)
    SUBJECTIVE:   CHIEF COMPLAINT / HPI:   BP check - BP today within goal range - tolerating amlodipine 5 mg very well - no dizziness, orthostatics, limb edema - Gail Bass has been experiencing fewer headaches now that she is on amlodipine  OAB, levator spasm, urinary incontinence - seen by urogyn - dx with levator spasm, overactive bladder, mixed urge/stress urinary incontinence - recommended to start myrbetriq; however, after reading side effects Gail Bass is concerned about taking this med and overheating. S/e pamphlet talked about increased risk of heat stroke/heat intolerance and patient works in [employer] - has not yet started [employer] and does not intend to - recommended pelvic floor PT, but hasn't called them back yet to schedule  PERTINENT  PMH / PSH: HTN, migraine, AUB, mixed stress/urge urinary incontinence, overactive bladder, levator spasm and dyspareunia, renal stones  OBJECTIVE:   BP 128/82   Pulse 75   Ht 5\' 3"  (1.6 m)   Wt 164 lb (74.4 kg)   LMP 06/01/2020   SpO2 98%   BMI 29.05 kg/m    PHQ-9:  Depression screen [facility] 2/9 04/25/2020 04/10/2020 11/21/2019  Decreased Interest 0 0 0  Down, Depressed, Hopeless 0 0 1  PHQ - 2 Score 0 0 1  Altered sleeping 0 0 0  Tired, decreased energy 1 1 0  Change in appetite 0 0 0  Feeling bad or failure about yourself  0 0 0  Trouble concentrating 0 0 0  Moving slowly or fidgety/restless 0 0 0  Suicidal thoughts 0 0 0  PHQ-9 Score 1 1 1   Difficult doing work/chores Somewhat difficult Not difficult at all -     GAD-7:  GAD 7 : Generalized Anxiety Score 04/25/2018  Nervous, Anxious, on Edge 0  Control/stop worrying 1  Worry too much - different things 1  Trouble relaxing 0  Restless 0  Easily annoyed or irritable 0  Afraid - awful might happen 0  Total GAD 7 Score 2     Physical Exam General: Awake, alert, oriented Cardiovascular: Regular rate and rhythm, S1 and S2 present, no murmurs  auscultated Respiratory: Lung fields clear to auscultation bilaterally  ASSESSMENT/PLAN:   Hypertension BP 128/82 in office today. Tolerating well. No adverse side effects. Reports fewer headaches since starting amlodipine. No changes at this time.   Levator spasm Discussed what to expect with pelvic floor PT. Patient more amenable after discussion. Will call them back to schedule.  Overactive bladder Not started Myrbetriq and does not intend to. Encouraged patient to reach back out to Urogyn regarding the medication.      , MD [facility]

## 2020-07-01 NOTE — Patient Instructions (Addendum)
It was wonderful to see you today. Thank you for allowing me to be a part of your care. Below is a short summary of what we discussed at your visit today:  Urinary medicine Please contact your urogyn provider about the medication and see if there's another medication she thinks would work for you.   Pelvic floor physical therapy Call them back as soon as you're able to set up an appointment. This should definitely help with the muscle tenderness in your pelvic floor.   Blood pressure Your blood pressure is doing great. Since you are tolerating your medication well, I think we do not need to make any changes. Please let us know if you have dizzy episodes.    Please bring all of your medications to every appointment!  If you have any questions or concerns, please do not hesitate to contact us via phone or MyChart message.   Fayette Pho, MD

## 2020-07-03 ENCOUNTER — Encounter: Payer: Self-pay | Admitting: Family Medicine

## 2020-07-03 DIAGNOSIS — N941 Unspecified dyspareunia: Secondary | ICD-10-CM | POA: Insufficient documentation

## 2020-07-03 DIAGNOSIS — M62838 Other muscle spasm: Secondary | ICD-10-CM | POA: Insufficient documentation

## 2020-07-03 DIAGNOSIS — N3281 Overactive bladder: Secondary | ICD-10-CM | POA: Insufficient documentation

## 2020-07-03 NOTE — Assessment & Plan Note (Signed)
BP 128/82 in office today. Tolerating well. No adverse side effects. Reports fewer headaches since starting amlodipine. No changes at this time.

## 2020-07-03 NOTE — Assessment & Plan Note (Signed)
Discussed what to expect with pelvic floor PT. Patient more amenable after discussion. Will call them back to schedule.

## 2020-07-03 NOTE — Assessment & Plan Note (Signed)
Not started Myrbetriq and does not intend to. Encouraged patient to reach back out to Urogyn regarding the medication.

## 2020-08-06 ENCOUNTER — Encounter: Payer: Medicaid Other | Admitting: Family Medicine

## 2020-09-04 ENCOUNTER — Other Ambulatory Visit: Payer: Self-pay | Admitting: Family Medicine

## 2020-09-04 DIAGNOSIS — I10 Essential (primary) hypertension: Secondary | ICD-10-CM

## 2021-01-07 ENCOUNTER — Encounter (HOSPITAL_COMMUNITY): Payer: Self-pay | Admitting: Emergency Medicine

## 2021-01-07 ENCOUNTER — Ambulatory Visit (HOSPITAL_COMMUNITY)
Admission: EM | Admit: 2021-01-07 | Discharge: 2021-01-07 | Disposition: A | Discharge status: Home / Self Care | Payer: Medicaid Other | Attending: Urgent Care | Admitting: Urgent Care

## 2021-01-07 ENCOUNTER — Other Ambulatory Visit: Payer: Self-pay | Admitting: Family Medicine

## 2021-01-07 ENCOUNTER — Other Ambulatory Visit: Payer: Self-pay

## 2021-01-07 DIAGNOSIS — R062 Wheezing: Secondary | ICD-10-CM

## 2021-01-07 DIAGNOSIS — J4530 Mild persistent asthma, uncomplicated: Secondary | ICD-10-CM

## 2021-01-07 DIAGNOSIS — I10 Essential (primary) hypertension: Secondary | ICD-10-CM

## 2021-01-07 DIAGNOSIS — J069 Acute upper respiratory infection, unspecified: Secondary | ICD-10-CM | POA: Diagnosis not present

## 2021-01-07 MED ORDER — ALBUTEROL SULFATE HFA 108 (90 BASE) MCG/ACT IN AERS: 2.0000 | INHALATION_SPRAY | RESPIRATORY_TRACT | 0 refills | Status: DC | PRN

## 2021-01-07 MED ORDER — CETIRIZINE HCL 10 MG PO TABS: ORAL_TABLET | ORAL | 1 refills | Status: DC

## 2021-01-07 MED ORDER — FLUTICASONE PROPIONATE 50 MCG/ACT NA SUSP: 2.0000 | Freq: Every day | NASAL | 12 refills | Status: DC

## 2021-01-07 MED ORDER — BENZONATATE 100 MG PO CAPS: 100.0000 mg | ORAL_CAPSULE | Freq: Three times a day (TID) | ORAL | 0 refills | Status: DC | PRN

## 2021-01-07 MED ORDER — AMOXICILLIN-POT CLAVULANATE 875-125 MG PO TABS: 1.0000 | ORAL_TABLET | Freq: Two times a day (BID) | ORAL | 0 refills | Status: DC

## 2021-01-07 MED ORDER — PREDNISONE 50 MG PO TABS: 50.0000 mg | ORAL_TABLET | Freq: Every day | ORAL | 0 refills | Status: DC

## 2021-01-07 MED ORDER — PROMETHAZINE-DM 6.25-15 MG/5ML PO SYRP: 5.0000 mL | ORAL_SOLUTION | Freq: Every evening | ORAL | 0 refills | Status: DC | PRN

## 2021-01-07 NOTE — ED Provider Notes (Addendum)
[facility]   MRN: [id number] DOB: 1975-11-27  Subjective:   Gail Bass is a 45 y.o. female presenting for 7 to 8-day history of persistent coughing, wheezing, sinus congestion, fatigue, sinus headaches, shortness of breath.  Patient has had multiple sick contacts at school, her son recently tested positive for RSV.  Her husband also had bronchitis.  She has a history of asthma, has been using an expired inhaler.  Needs a refill.   No current facility-administered medications for this encounter.  Current Outpatient Medications:    acyclovir (ZOVIRAX) 400 MG tablet, Take 1 tablet (400 mg total) by mouth 2 (two) times daily., Disp: 60 tablet, Rfl: 0   albuterol (VENTOLIN HFA) 108 (90 Base) MCG/ACT inhaler, Inhale 2 puffs into the lungs every 4 (four) hours as needed for wheezing or shortness of breath., Disp: 18 g, Rfl: 0   amLODipine (NORVASC) 5 MG tablet, TAKE 1 TABLET(5 MG) BY MOUTH AT BEDTIME, Disp: 90 tablet, Rfl: 0   cetirizine (ZYRTEC) 10 MG tablet, TAKE 1 TABLET(10 MG) BY MOUTH DAILY, Disp: 90 tablet, Rfl: 1   fluticasone (FLONASE) 50 MCG/ACT nasal spray, SHAKE LIQUID AND USE 2 SPRAYS IN EACH NOSTRIL DAILY, Disp: 16 g, Rfl: 3   hydrocortisone (ANUSOL-HC) 2.5 % rectal cream, Place 1 application rectally 2 (two) times daily., Disp: 30 g, Rfl: 1   ibuprofen (ADVIL) 600 MG tablet, Take 1 tablet (600 mg total) by mouth every 6 (six) hours as needed., Disp: 30 tablet, Rfl: 0   Lysine 1000 MG TABS, Take 1 tablet (1,000 mg total) by mouth daily., Disp: , Rfl: 0   montelukast (SINGULAIR) 10 MG tablet, Take 1 tablet (10 mg total) by mouth at bedtime., Disp: 30 tablet, Rfl: 0   solifenacin (VESICARE) 5 MG tablet, Take 1 tablet (5 mg total) by mouth daily., Disp: 30 tablet, Rfl: 5   Allergies  Allergen Reactions   Doxycycline     REACTION: Nausea   Lorcet [Hydrocodone-Acetaminophen] Nausea And Vomiting and Rash    Past Medical History:  Diagnosis Date   Abscess of  buttock 08/13/2019   Asthma    Bacterial vaginosis    Boil 06/14/2019   BV (bacterial vaginosis) 09/13/2008        Hypertension 05/11/2013   Kidney stone    OVARIAN CYST, LEFT 10/29/2008   Qualifier: Diagnosis of  By: Lanier Prude  MD, Gail Bass     Pain in the chest 06/06/2015   Painful lumpy right breast 03/21/2013   Recurrent cold sores 12/05/2015   Recurrent sinusitis 06/06/2015   Sinus congestion 04/25/2018   TUBAL PREGNANCY 09/13/2008   Qualifier: History of  By: Lanier Prude  MD, Gail Bass     Vaginal discharge 01/10/2014   Vaginal discharge 01/10/2014     Past Surgical History:  Procedure Laterality Date   CYSTECTOMY     CYSTOSCOPY WITH RETROGRADE PYELOGRAM, URETEROSCOPY AND STENT PLACEMENT Left 02/22/2014   Procedure: CYSTOSCOPY WITH RETROGRADE PYELOGRAM, URETEROSCOPY, STONE BASKET EXTRACTION ;  Surgeon: Kathi Ludwig, MD;  Location: [facility];  Service: Urology;  Laterality: Left;   tailbone cyst     TUBAL LIGATION      Family History  Problem Relation Age of Onset   Arrhythmia Father        Pacemakers placed didn't take passed away 3 moths later   Arrhythmia Sister        Pacemaker didn't take to body passed away 3 months ;later   Hypertension Maternal Aunt  Heart failure Maternal Grandmother    Hypertension Maternal Grandfather    Diabetes Other    CAD Other    Heart Problems Mother    Heart failure Mother     Social History   Tobacco Use   Smoking status: Former    Packs/day: 0.50    Types: Cigarettes    Start date: 11/19/2012   Smokeless tobacco: Never  Vaping Use   Vaping Use: Never used  Substance Use Topics   Alcohol use: No   Drug use: Yes    Types: Marijuana    ROS   Objective:   Vitals: BP (!) 154/100 (BP Location: Left Arm)   Pulse 83   Temp 98 F (36.7 C) (Oral)   Resp 18   LMP 12/24/2020   SpO2 98%   Physical Exam Constitutional:      General: She is not in acute distress.    Appearance: Normal appearance. She is well-developed. She is  not ill-appearing, toxic-appearing or diaphoretic.  HENT:     Head: Normocephalic and atraumatic.     Right Ear: External ear normal.     Left Ear: External ear normal.     Nose: Congestion and rhinorrhea present.     Mouth/Throat:     Mouth: Mucous membranes are moist.     Pharynx: No oropharyngeal exudate or posterior oropharyngeal erythema.  Eyes:     General: No scleral icterus.       Right eye: No discharge.        Left eye: No discharge.     Extraocular Movements: Extraocular movements intact.     Conjunctiva/sclera: Conjunctivae normal.     Pupils: Pupils are equal, round, and reactive to light.  Neck:     Meningeal: Brudzinski's sign and Kernig's sign absent.  Cardiovascular:     Rate and Rhythm: Normal rate and regular rhythm.     Pulses: Normal pulses.     Heart sounds: Normal heart sounds. No murmur heard.   No friction rub. No gallop.  Pulmonary:     Effort: Pulmonary effort is normal. No respiratory distress.     Breath sounds: No stridor. Wheezing (throughout) present. No rhonchi or rales.  Musculoskeletal:     Cervical back: No rigidity.  Lymphadenopathy:     Cervical: No cervical adenopathy.  Skin:    General: Skin is warm and dry.     Findings: No rash.  Neurological:     Mental Status: She is alert and oriented to person, place, and time.     Cranial Nerves: No cranial nerve deficit.     Motor: No weakness.     Coordination: Coordination normal.     Gait: Gait normal.     Deep Tendon Reflexes: Reflexes normal.  Psychiatric:        Mood and Affect: Mood normal.        Behavior: Behavior normal.        Thought Content: Thought content normal.        Judgment: Judgment normal.     Assessment and Plan :   PDMP not reviewed this encounter.  1. Viral URI with cough   2. Wheezing   3. Mild persistent asthma without complication     Deferred testing given the timeline of illness.  In light of her asthma, history of allergic rhinitis, wheezing on exam  we will use an oral prednisone course.  Refilled her albuterol inhaler.  Also refilled her allergy medications. Deferred imaging given clear cardiopulmonary exam,  hemodynamically stable vital signs. If no improvement in 72 hours, recommended filling printed script for Augmentin to address a sinus infection. Recheck thereafter. Counseled patient on potential for adverse effects with medications prescribed/recommended today, ER and return-to-clinic precautions discussed, patient verbalized understanding.      Wallis Bamberg, [place] 01/07/21 [phone]

## 2021-01-07 NOTE — ED Triage Notes (Signed)
For a week had cough, congestion, fatigue, headache and SOB. Reports husband has bronchitis and grandson has bronchitis and RSV. Pt also works in school.

## 2021-01-10 ENCOUNTER — Other Ambulatory Visit: Payer: Self-pay | Admitting: Internal Medicine

## 2021-01-10 ENCOUNTER — Encounter: Payer: Self-pay | Admitting: Family Medicine

## 2021-01-10 DIAGNOSIS — B001 Herpesviral vesicular dermatitis: Secondary | ICD-10-CM

## 2021-01-11 ENCOUNTER — Other Ambulatory Visit: Payer: Self-pay | Admitting: Family Medicine

## 2021-01-11 DIAGNOSIS — B001 Herpesviral vesicular dermatitis: Secondary | ICD-10-CM

## 2021-01-11 DIAGNOSIS — J302 Other seasonal allergic rhinitis: Secondary | ICD-10-CM

## 2021-01-11 MED ORDER — CETIRIZINE HCL 10 MG PO TABS: ORAL_TABLET | ORAL | 1 refills | Status: DC

## 2021-01-11 MED ORDER — ACYCLOVIR 400 MG PO TABS: 400.0000 mg | ORAL_TABLET | Freq: Two times a day (BID) | ORAL | 2 refills | Status: DC

## 2021-02-07 IMAGING — US US PELVIS COMPLETE WITH TRANSVAGINAL
1 series · 14 of 25 positions shown · non-contrast
Comparison: 11/14/2008

CLINICAL DATA: PID, painful intercourse for 6 months; unknown LMP

EXAM:
TRANSABDOMINAL AND TRANSVAGINAL ULTRASOUND OF PELVIS
TECHNIQUE: Both transabdominal and transvaginal ultrasound examinations of the
pelvis were performed. Transabdominal technique was performed for
global imaging of the pelvis including uterus, ovaries, adnexal
regions, and pelvic cul-de-sac. It was necessary to proceed with
endovaginal exam following the transabdominal exam to visualize the
endometrium and ovaries.

[Series 1: us pelvis complete with transvaginal · 74 acquisitions, 14 frames shown]
[im 1/74]
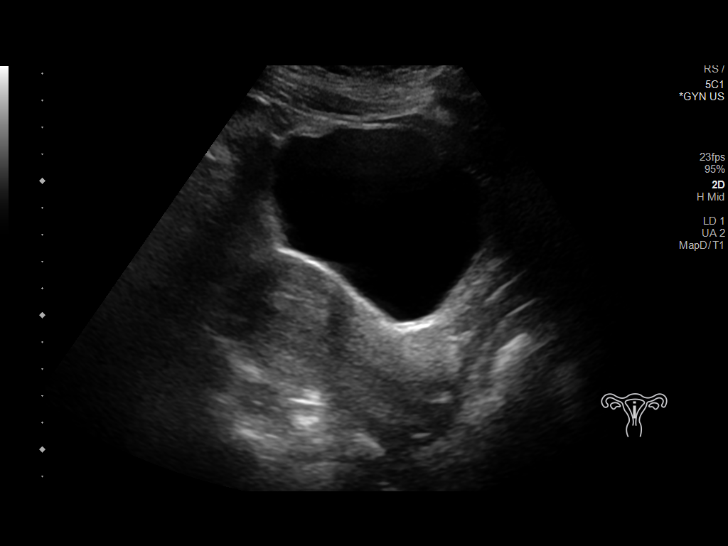
[im 7/74]
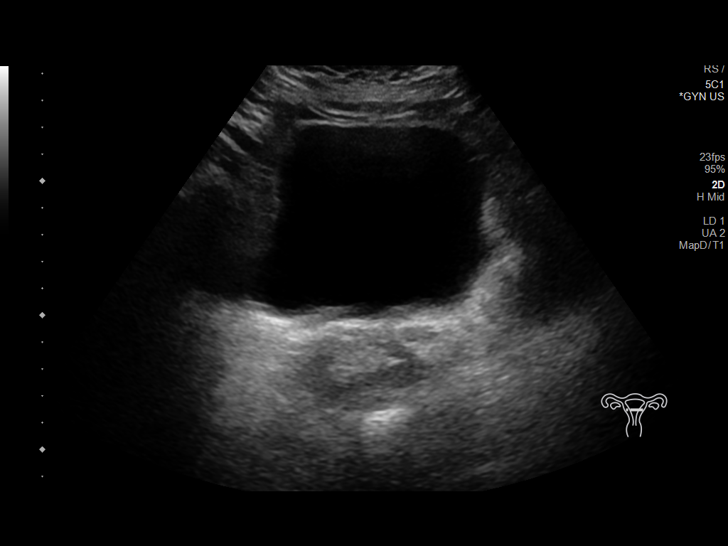
[im 13/74]
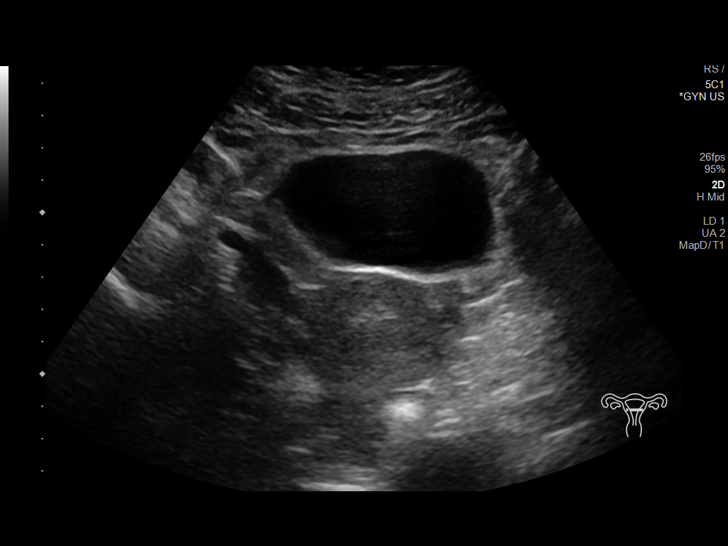
[im 19/74]
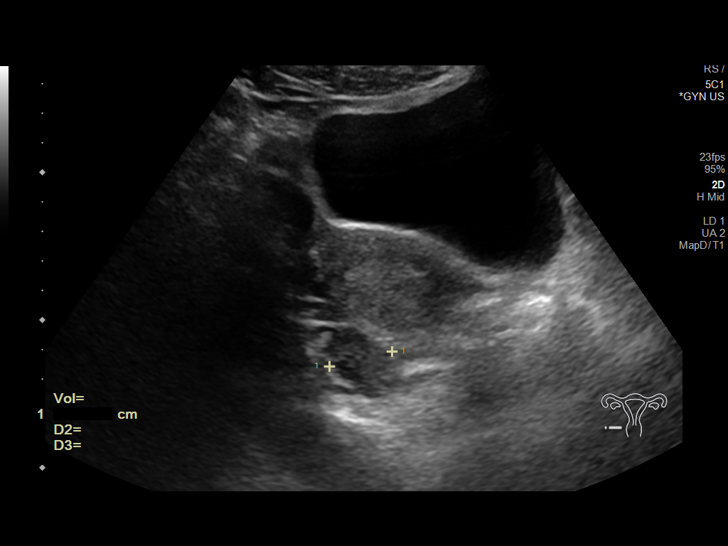
[im 25/74]
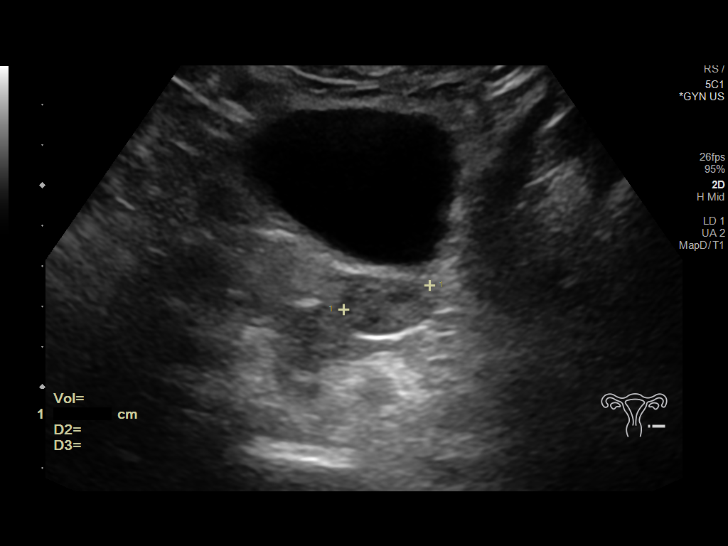
[im 28/74]
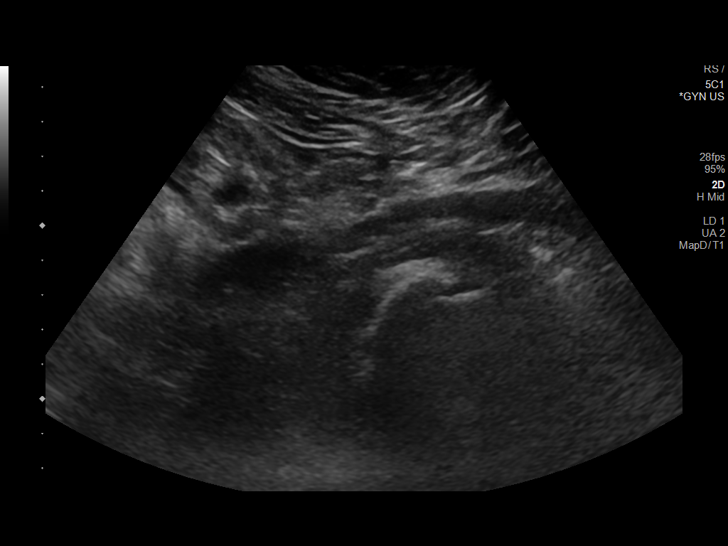
[im 34/74]
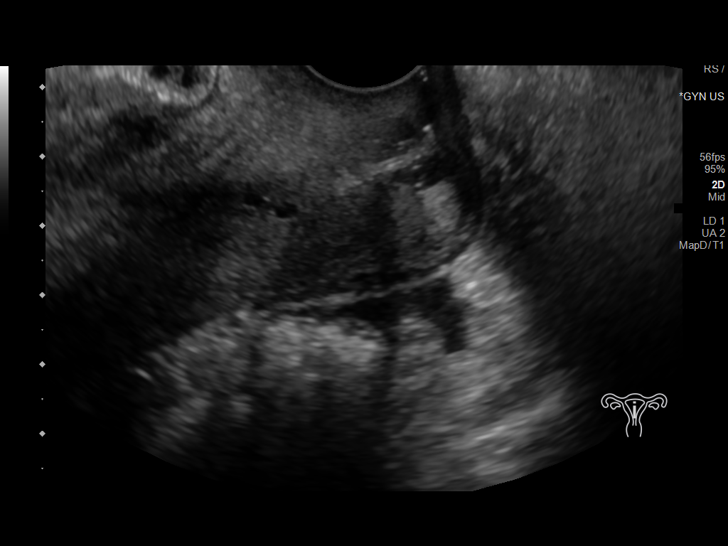
[im 40/74]
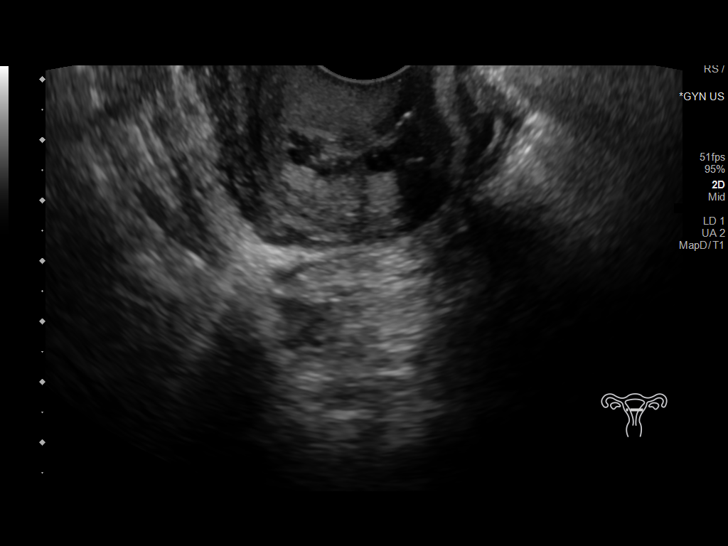
[im 46/74]
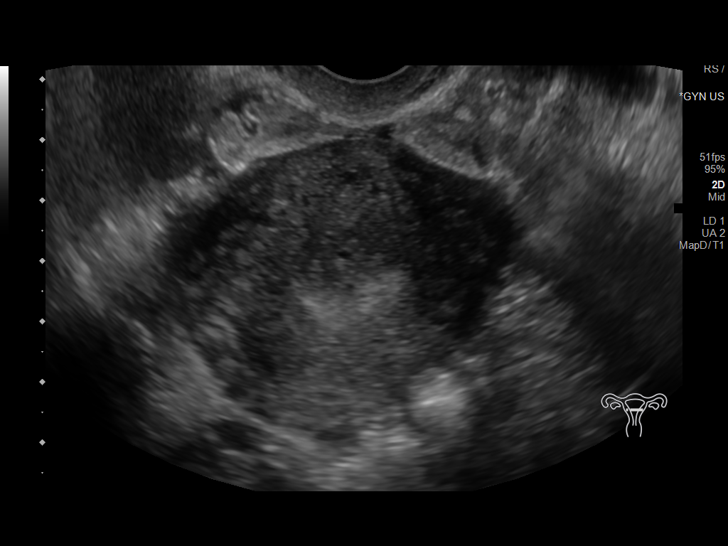
[im 49/74]
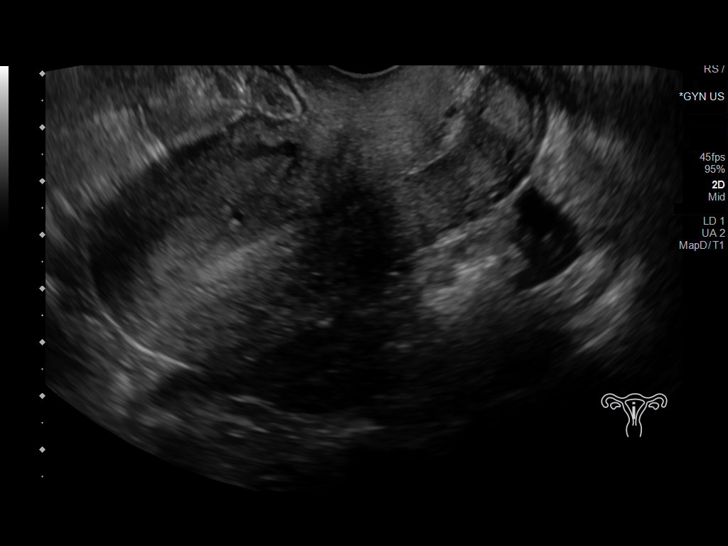
[im 55/74]
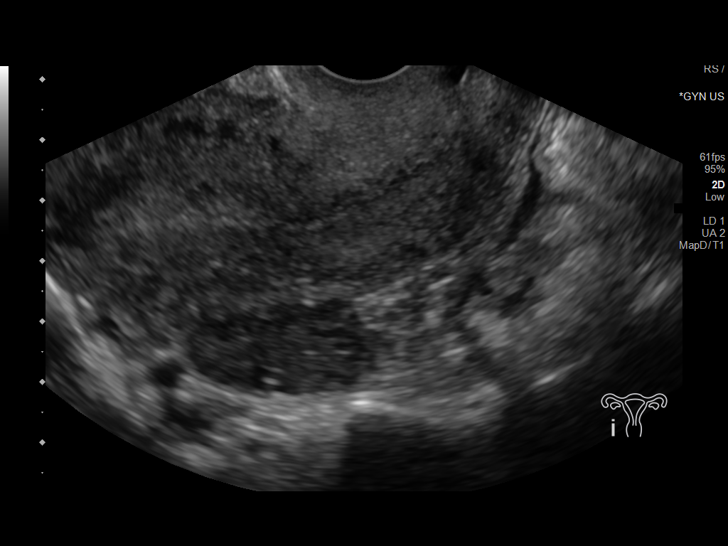
[im 61/74]
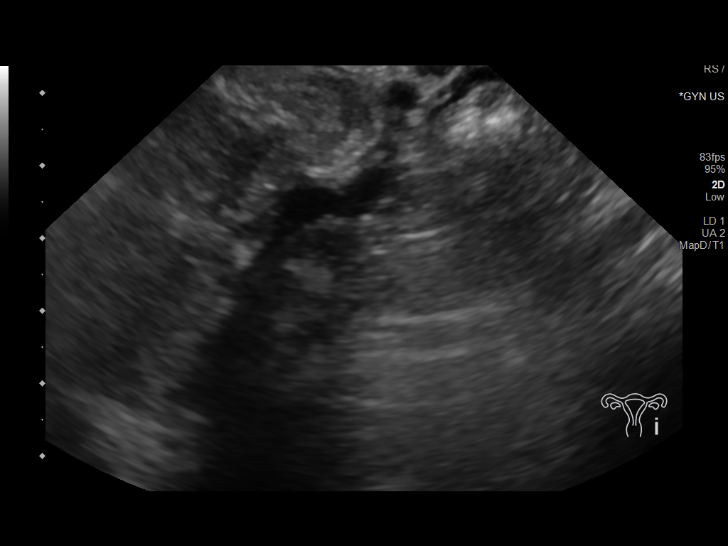
[im 67/74]
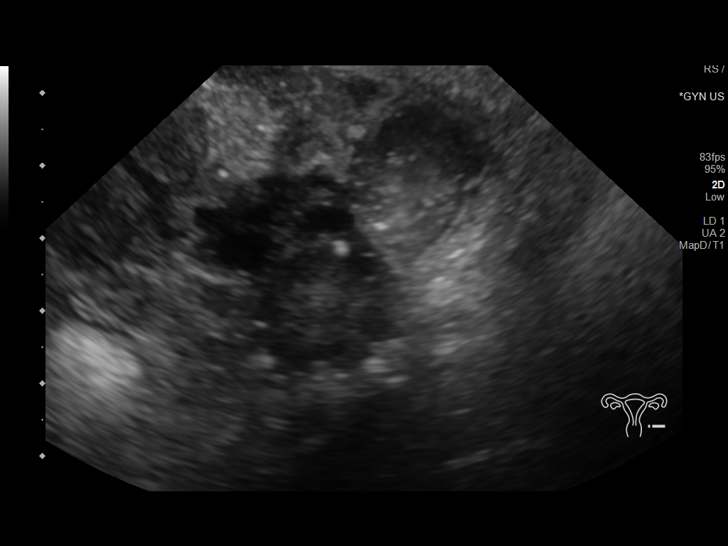
[im 74/74]
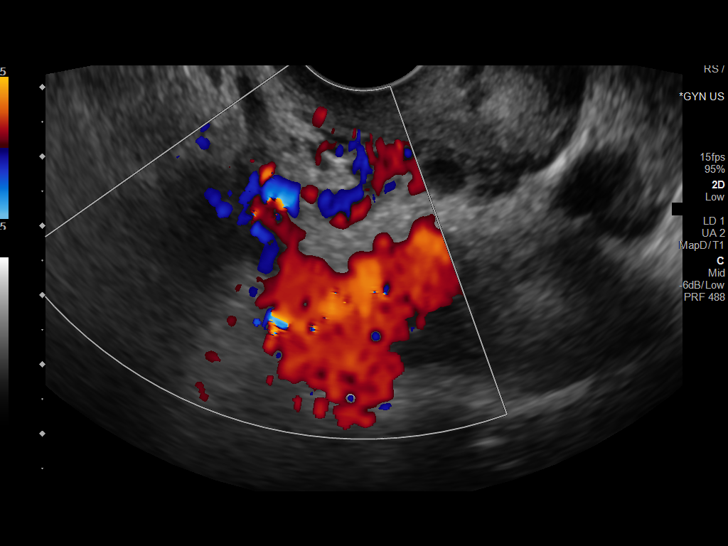

[14 of 25 positions shown; findings below may reference images not displayed]

FINDINGS: Uterus

Measurements: 8.6 x 4.7 x 5.5 cm = volume: 115 mL. Anteverted.
Normal morphology without mass. Nabothian cysts at cervix.

Endometrium

Thickness: 11 mm.  No endometrial fluid or focal abnormality

Right ovary

Measurements: 3.2 x 1.9 x 2.2 cm = volume: 6.7 mL. Normal morphology
without mass

Left ovary

Measurements: 2.5 x 1.5 x 1.9 cm = volume: 3.6 mL. Normal morphology
without mass

Other findings

Trace free pelvic fluid likely physiologic.  No adnexal masses.
IMPRESSION: Normal exam.

## 2021-02-13 ENCOUNTER — Other Ambulatory Visit: Payer: Self-pay

## 2021-02-13 ENCOUNTER — Ambulatory Visit (INDEPENDENT_AMBULATORY_CARE_PROVIDER_SITE_OTHER): Payer: Medicaid Other | Admitting: Student

## 2021-02-13 ENCOUNTER — Encounter: Payer: Self-pay | Admitting: Student

## 2021-02-13 VITALS — BP 139/93 | HR 84 | Wt 164.4 lb

## 2021-02-13 DIAGNOSIS — N898 Other specified noninflammatory disorders of vagina: Secondary | ICD-10-CM

## 2021-02-13 DIAGNOSIS — M549 Dorsalgia, unspecified: Secondary | ICD-10-CM | POA: Diagnosis not present

## 2021-02-13 LAB — POCT WET PREP (WET MOUNT)
Clue Cells Wet Prep Whiff POC: NEGATIVE
Trichomonas Wet Prep HPF POC: ABSENT

## 2021-02-13 LAB — POCT URINALYSIS DIP (MANUAL ENTRY)
Bilirubin, UA: NEGATIVE
Glucose, UA: NEGATIVE mg/dL
Ketones, POC UA: NEGATIVE mg/dL
Leukocytes, UA: NEGATIVE
Nitrite, UA: NEGATIVE
Protein Ur, POC: NEGATIVE mg/dL
Spec Grav, UA: 1.025 (ref 1.010–1.025)
Urobilinogen, UA: 0.2 E.U./dL
pH, UA: 5.5 (ref 5.0–8.0)

## 2021-02-13 LAB — POCT UA - MICROSCOPIC ONLY

## 2021-02-13 MED ORDER — AMLODIPINE BESYLATE 10 MG PO TABS: 10.0000 mg | ORAL_TABLET | Freq: Every day | ORAL | 3 refills | Status: DC

## 2021-02-13 MED ORDER — CLINDAMYCIN HCL 300 MG PO CAPS: 300.0000 mg | ORAL_CAPSULE | Freq: Two times a day (BID) | ORAL | 0 refills | Status: DC

## 2021-02-13 NOTE — Progress Notes (Signed)
SUBJECTIVE:   CHIEF COMPLAINT / HPI: Back pain, vaginal discharge  Back pain   Hx of renal stones Had renal US in 02/22 showed a 4 mm calculus in mid right kidney had stone. Had hx of kidney stone in 2015 which requires surgery.  Back pain started 3 weeks ago, located at the right flank more than left. It is now more consistent and worse. No fevers but has had chills. Has increased urinary frequency but only urinating small amounts for the past 2 weeks. Has suprapubic pain she describes as an ache when she urinates.   Possible muscle spasm About a week ago she had an episode which felt like a spasm in her suprapubic area when she tried to urinate but was unable to, said it was painful and lasted about 10-15 seconds. After the spasm was over she was able to urinate. This has not occurred since  Vaginal discharge Pt was on abx for URI on 01/07/21. She noticed an increased clear/white vaginal discharge. She states "It smells like onions".     PERTINENT  PMH / PSH: Hypertension, migraines, depression, asthma, menorrhagia, PTSD, Levator spasm, dyspareunia  OBJECTIVE:   BP (!) 155/86    Pulse 84    Wt 164 lb 6.4 oz (74.6 kg)    SpO2 100%    BMI 29.12 kg/m    General: NAD, pleasant, able to participate in exam Cardiac: RRR, no murmurs. Respiratory: CTAB, normal effort, No wheezes, rales or rhonchi Abdomen: Bowel sounds present, nontender, nondistended, mild suprapubic tenderness Genitourinary: Chaperoned by CMA Desiree normal female external genitalia, no lesions, ulcers, warts on skin.  Pink moist vaginal mucosa with scant clear and white vaginal discharge. Cervix with cervical ectropion and with no lesions, ulcers, masses.  No cervical motion tenderness.   Extremities: no edema or cyanosis. Skin: warm and dry, no rashes noted Neuro: alert, no obvious focal deficits Psych: Normal affect and mood  ASSESSMENT/PLAN:   Back pain   history of renal stones Urinalysis was negative for UTI  and showed calcium oxylate crystals.   Based on patient preference, urine culture was sent. U/S in february showed very small 4 mm stone. I advised pt to drink plenty of fluids and return if symptoms worsen.   I do not think renal stones are the cause of her back pain.  As UTI is negative, patient has no fevers, and Lloyd sign was negative, I do not think patient has pyelonephritis.  Pt was also advised to reach out to urology.  Muscle spasm I think the episode that patient described as a spasm was a muscle spasm.  Patient has a history of levator spasms and stress urinary incontinence.  Patient was advised to reach out to the pelvic floor therapist which she was referred to by Dr. Larita Fife. She has not seen them as of yet.   Vaginal discharge Wet prep is negative for any organisms but shows 10-20 WBC.  Patient initially declined STD testing.  At the end of the visit, when wrapping up patient then requested STD testing.  Patient was told it was too late in the day to have this done as a lab would be closing.  She was advised to return if her symptoms persisted or she continues to be worried.  Patient did state she had pain during pelvic exam which is similar to dyspareunia which she has experienced for several years but I do not think she had cervical motion tenderness indicating PID.  We will treat  for BV based on symptoms and patient preference.  Patient specifically requests oral clindamycin.  She states that metronidazole causes severe headaches and nausea and that her insurance will cover the clindamycin gel/cream.  -clindamycin 300 mg BID for 7 days -Return if symptoms persist or worsen and consider STD testing  Hypertension Patient's blood pressure was increased to 135/86 and upon repeat was 139/93.  -Increase amlodipine to 10 mg daily

## 2021-02-13 NOTE — Assessment & Plan Note (Signed)
Wet prep is negative for any organisms but shows 10-20 WBC.  Patient declined STD testing.  We will treat for BV based on symptoms and patient preference.  Patient specifically requests oral clindamycin.  She states that metronidazole causes severe headaches and nausea and that her insurance will cover the clindamycin gel/cream.  -clindamycin 300 mg BID for 7 days -Return if symptoms persist or worsen and consider STD testing

## 2021-02-13 NOTE — Patient Instructions (Signed)
It was great to see you! Thank you for allowing me to participate in your care!  I recommend that you always bring your medications to each appointment as this makes it easy to ensure you are on the correct medications and helps [place] not miss when refills are needed.  Our plans for today:  -I have increased your dose of amlodipine and sent a new prescription to your pharmacy -I sent in a prescription of an antibiotic called clindamycin to your pharmacy.  Please take this 2 times a day(once in the morning and once in the evening) for 7 days -Please contact and follow-up with the pelvic floor therapist, your urologist, and possibly your gynecologist if you feel it is needed  We are checking some labs today, I will call you if they are abnormal will send you a MyChart message or a letter if they are normal.  If you do not hear about your labs in the next 2 weeks please let us know.  Take care and seek immediate care sooner if you develop any concerns.   Dr. Erick Alley, DO [facility]

## 2021-02-13 NOTE — Assessment & Plan Note (Signed)
Increase amlodipine to 10 mg daily

## 2021-02-16 LAB — URINE CULTURE

## 2021-02-27 ENCOUNTER — Other Ambulatory Visit: Payer: Self-pay

## 2021-02-27 ENCOUNTER — Ambulatory Visit (INDEPENDENT_AMBULATORY_CARE_PROVIDER_SITE_OTHER): Payer: Medicaid Other | Admitting: Family Medicine

## 2021-02-27 VITALS — BP 144/102 | HR 93 | Temp 98.6°F | Ht 63.0 in | Wt 159.4 lb

## 2021-02-27 DIAGNOSIS — J069 Acute upper respiratory infection, unspecified: Secondary | ICD-10-CM | POA: Diagnosis present

## 2021-02-27 DIAGNOSIS — B349 Viral infection, unspecified: Secondary | ICD-10-CM

## 2021-02-27 DIAGNOSIS — R519 Headache, unspecified: Secondary | ICD-10-CM | POA: Diagnosis not present

## 2021-02-27 LAB — POCT INFLUENZA A/B
Influenza A, POC: NEGATIVE
Influenza B, POC: NEGATIVE

## 2021-02-27 MED ORDER — PREDNISONE 20 MG PO TABS: 20.0000 mg | ORAL_TABLET | Freq: Every day | ORAL | 0 refills | Status: AC

## 2021-02-27 NOTE — Assessment & Plan Note (Addendum)
-  likely secondary to viral illness although flu negative and awaiting COVID. No antibiotics needed as bacterial etiology not likely as low concern for pneumonia, otitis media or other infection  -given presence of wheezing with dyspnea and history of asthma prescribed prednisone 20 mg for 3 days -continue supportive measures and reassurance provided -strict ED precautions discussed

## 2021-02-27 NOTE — Progress Notes (Signed)
° ° °  SUBJECTIVE:   CHIEF COMPLAINT / HPI:   Patient with history of asthma presents with 6 days onset of headache, cough, decreased energy, myalgias and fever for 3 days (Tmax 101.9).  Endorsing some shortness of breath and has been using her inhaler more than typical. Sick contacts include daughter's younger cousin who had RSV. States that this is the first day that she has felt better in awhile and still is feeling pretty bad.   OBJECTIVE:   BP (!) 144/102    Pulse 93    Temp 98.6 F (37 C) (Oral)    Ht 5\' 3"  (1.6 m)    Wt 159 lb 6 oz (72.3 kg)    BMI 28.23 kg/m   General: Patient tired appearing, slouched over in her chair with head laying on bed, in no acute distress. HEENT: presence of anterior cervical LAD noted, non-bulging TM bilaterally without erythema, moist mucous membranes with normal buccal mucosa CV: RRR, no murmurs or gallops auscultated Resp: faint wheezing noted diffusely, good air movement throughout all lung fields with congestion noted, non-focal findings, no signs of respiratory distress. Ext: radial pulses strong and equal bilaterally Derm: no rashes or lesions noted   ASSESSMENT/PLAN:   Viral illness -likely secondary to viral illness although flu negative and awaiting COVID. No antibiotics needed as bacterial etiology not likely as low concern for pneumonia, otitis media or other infection  -given presence of wheezing with dyspnea and history of asthma prescribed prednisone 20 mg for 3 days -continue supportive measures and reassurance provided -strict ED precautions discussed      , DO [facility]

## 2021-02-27 NOTE — Patient Instructions (Signed)
It was great seeing you today!  I am sorry that you are not feeling well, I heard some very mild wheezing and will prescribe prednisone 20 mg, please take this daily for the next 3 days.  Get plenty of rest and drinks plenty of fluids. Honey can help with the cough and using a humidifier can help with the congestion.  If you are not able to tolerate fluids or are having difficulty breathing then please go to the emergency department.   Please follow up at your next scheduled appointment, if anything arises between now and then, please don't hesitate to contact our office.   Thank you for allowing [place] to be a part of your medical care!  Thank you, Dr. Robyne Peers

## 2021-02-28 LAB — SARS-COV-2, NAA 2 DAY TAT

## 2021-02-28 LAB — NOVEL CORONAVIRUS, NAA: SARS-CoV-2, NAA: NOT DETECTED

## 2021-03-18 ENCOUNTER — Encounter: Payer: Self-pay | Admitting: Family Medicine

## 2021-03-18 NOTE — Telephone Encounter (Signed)
Called patient to discuss mychart message. Patient reports that since starting increased dosage of amlodpine that she has been having headaches. Dosage was increased on 02/13/2021. Reports that this has been going on for approx the last three weeks.   Patient has been taking tylenol for the pain with slight relief. States that the pain decreases toward late afternoon, however, returns about 30-40 minutes after taking amlodipine 10 mg.   Denies chest pain, SHOB, dizziness, blurry vision. Has not been checking BP at home.  Scheduled patient for follow up appointment on Thursday afternoon (next available that worked with patient's schedule)  Provided with ED precautions.   Veronda Prude, RN

## 2021-03-20 ENCOUNTER — Ambulatory Visit (INDEPENDENT_AMBULATORY_CARE_PROVIDER_SITE_OTHER): Payer: Medicaid Other | Admitting: Family Medicine

## 2021-03-20 ENCOUNTER — Other Ambulatory Visit (HOSPITAL_COMMUNITY): Payer: Self-pay

## 2021-03-20 ENCOUNTER — Other Ambulatory Visit: Payer: Self-pay

## 2021-03-20 VITALS — BP 146/91 | HR 69 | Wt 166.0 lb

## 2021-03-20 DIAGNOSIS — J4520 Mild intermittent asthma, uncomplicated: Secondary | ICD-10-CM | POA: Diagnosis not present

## 2021-03-20 DIAGNOSIS — B001 Herpesviral vesicular dermatitis: Secondary | ICD-10-CM | POA: Diagnosis not present

## 2021-03-20 DIAGNOSIS — J302 Other seasonal allergic rhinitis: Secondary | ICD-10-CM

## 2021-03-20 DIAGNOSIS — I10 Essential (primary) hypertension: Secondary | ICD-10-CM

## 2021-03-20 DIAGNOSIS — F17200 Nicotine dependence, unspecified, uncomplicated: Secondary | ICD-10-CM

## 2021-03-20 DIAGNOSIS — T7840XA Allergy, unspecified, initial encounter: Secondary | ICD-10-CM

## 2021-03-20 MED ORDER — FLUTICASONE PROPIONATE 50 MCG/ACT NA SUSP: 2.0000 | Freq: Every day | NASAL | 12 refills | Status: DC

## 2021-03-20 MED ORDER — LOSARTAN POTASSIUM 25 MG PO TABS: 25.0000 mg | ORAL_TABLET | Freq: Every day | ORAL | 3 refills | Status: DC

## 2021-03-20 MED ORDER — ACYCLOVIR 400 MG PO TABS: 400.0000 mg | ORAL_TABLET | Freq: Two times a day (BID) | ORAL | 2 refills | Status: DC

## 2021-03-20 MED ORDER — NICOTINE 14 MG/24HR TD PT24
14.0000 mg | MEDICATED_PATCH | Freq: Every morning | TRANSDERMAL | 3 refills | Status: DC
  Filled 2021-03-20: qty 14, 14d supply, fill #0
  Filled 2021-08-27: qty 14, 14d supply, fill #1
  Filled 2022-01-16: qty 14, 14d supply, fill #2

## 2021-03-20 MED ORDER — CETIRIZINE HCL 10 MG PO TABS: ORAL_TABLET | ORAL | 1 refills | Status: DC

## 2021-03-20 MED ORDER — ALBUTEROL SULFATE HFA 108 (90 BASE) MCG/ACT IN AERS: 2.0000 | INHALATION_SPRAY | RESPIRATORY_TRACT | 0 refills | Status: DC | PRN

## 2021-03-20 NOTE — Patient Instructions (Signed)
It was a pleasure to see you today!  We will get some labs today.  If they are abnormal or we need to do something about them, I will call you.  If they are normal, I will send you a message on MyChart (if it is active) or a letter in the mail.  If you don't hear from [place] in 2 weeks, please call the office  [phone]. Follow up in 2-3 weeks with Dr. Larita Fife- make appt up front Stop taking amlodipine, start losartan 25 mg once a day. Let me know if you have any problems.  Be Well,  Dr. Leary Roca

## 2021-03-20 NOTE — Assessment & Plan Note (Signed)
Phase of change.  Gave patient resources of quit line, sent 14mg  nicotine patches to pharmacy for her

## 2021-03-20 NOTE — Assessment & Plan Note (Signed)
Patient was unfortunately able to tolerate amlodipine dose.  Uncontrolled at amlodipine low-dose.  Will trial losartan 25 mg, will stop amlodipine and assess. BMP obtained today.  Follow-up in 2 weeks

## 2021-03-20 NOTE — Progress Notes (Signed)
° ° °  SUBJECTIVE:   CHIEF COMPLAINT / HPI:   HTN: BP today is not at goal: 149/75. She was increased to amlodipine 10 mg in November, but had terrible headaches, a common SE with amlodipine. She has since decreased back to the tolerated dose of 5mg  amlodipine, with resolution of headaches. No symptoms today.  Tobacco use: Patient quit 3 times in the past, started smoking half pack per day.  She is interested in quitting smoking again.  She has tried varenicline in the past but had terrible nausea and did not tolerate that medication.  She also does not like lozenges or gum.  She has had success in the past with the patch and would like this again.  PERTINENT  PMH / PSH: Hypertension  OBJECTIVE:   BP (!) 146/91    Pulse 69    Wt 166 lb (75.3 kg)    SpO2 98%    BMI 29.41 kg/m   Nursing note and vitals reviewed GEN: age appropriate, WW, resting comfortably in chair, NAD, overweight Cardiac: Regular rate and rhythm. Normal S1/S2. No murmurs, rubs, or gallops appreciated. 2+ radial pulses. Lungs: Clear bilaterally to ascultation. No increased WOB, no accessory muscle usage. No w/r/r. Neuro: AOx3  Ext: no edema Psych: Pleasant and appropriate   ASSESSMENT/PLAN:   Tobacco use disorder Phase of change.  Gave patient resources of quit line, sent 14mg  nicotine patches to pharmacy for her  Hypertension Patient was unfortunately able to tolerate amlodipine dose.  Uncontrolled at amlodipine low-dose.  Will trial losartan 25 mg, will stop amlodipine and assess. BMP obtained today.  Follow-up in 2 weeks     , MD [facility]

## 2021-03-21 LAB — BASIC METABOLIC PANEL
BUN/Creatinine Ratio: 18 (ref 9–23)
BUN: 13 mg/dL (ref 6–24)
CO2: 23 mmol/L (ref 20–29)
Calcium: 9.2 mg/dL (ref 8.7–10.2)
Chloride: 104 mmol/L (ref 96–106)
Creatinine, Ser: 0.71 mg/dL (ref 0.57–1.00)
Glucose: 93 mg/dL (ref 70–99)
Potassium: 4.3 mmol/L (ref 3.5–5.2)
Sodium: 142 mmol/L (ref 134–144)
eGFR: 108 mL/min/{1.73_m2} (ref >59)

## 2021-04-10 NOTE — Progress Notes (Signed)
° ° °  SUBJECTIVE:   CHIEF COMPLAINT / HPI:   Cough   congestion   fever: 46 year old female presenting with cough and congestion since November.  She is also endorsing feeling malaise. She states there may have been a few days where she has felt better but it's mostly been consistent since November. She works at an [employer] but has worked there for 5 years. She had a fever of 100.19F last week. She was tested for Covid about 6 weeks ago during this illness and was negative. She has been feeling more fatigued that usual. No vomiting or diarrhea. She has had some chills and body aches over the last week. She smokes 1/2 ppd for 25 years or so. She has been using albuterol daily the last few weeks.  She did take a couple of days of Bactrim that she had leftover which seemed to improve her symptoms.    PERTINENT  PMH / PSH: Intermittent asthma  OBJECTIVE:   BP 138/87    Pulse 85    Temp 98.8 F (37.1 C) (Oral)    Ht 5\' 3"  (1.6 m)    Wt 163 lb 2 oz (74 kg)    LMP 03/21/2021    SpO2 99%    BMI 28.90 kg/m    General: NAD, pleasant, able to participate in exam HEENT: No pharyngeal erythema,No cervical lymphadenopathy Cardiac: RRR, no murmurs. Respiratory: Wheezing present throughout with no focal findings of decreased air movement, no respiratory distress on room air Abdomen: Bowel sounds present, nontender Skin: warm and dry, no rashes noted  ASSESSMENT/PLAN:    Cough   fever   congestion: Assessment: 46 y.o. female with cough, congestion, recent fever with symptoms presenting for at least a month or 2 possibly longer.  She has had a few intermittent improvements in her symptoms but overall still feels unwell since November.  She has been tested for COVID and flu and has been negative.  She did have a fever of 100.4 last week.  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  She does have a history of intermittent asthma for which she uses  albuterol.  On physical exam she is in no acute distress but does have some diffuse wheezing present on lung exam.  Differential can include atypical pneumonia versus repeat viral infections versus exacerbation or worsening of her asthma versus new COPD diagnosis.  We did a chest x-ray today as well as a CBC.  If negative will get set up for PFT testing.  Recommended her follow-up with me later next week.  Discussed return precautions.  December, DO [facility]

## 2021-04-11 ENCOUNTER — Ambulatory Visit (INDEPENDENT_AMBULATORY_CARE_PROVIDER_SITE_OTHER): Payer: Medicaid Other | Admitting: Family Medicine

## 2021-04-11 ENCOUNTER — Ambulatory Visit (HOSPITAL_COMMUNITY)
Admission: RE | Admit: 2021-04-11 | Discharge: 2021-04-11 | Disposition: A | Discharge status: Home / Self Care | Payer: Medicaid Other | Source: Ambulatory Visit | Attending: Family Medicine | Admitting: Family Medicine

## 2021-04-11 ENCOUNTER — Other Ambulatory Visit: Payer: Self-pay

## 2021-04-11 VITALS — BP 138/87 | HR 85 | Temp 98.8°F | Ht 63.0 in | Wt 163.1 lb

## 2021-04-11 DIAGNOSIS — R059 Cough, unspecified: Secondary | ICD-10-CM | POA: Diagnosis not present

## 2021-04-11 DIAGNOSIS — R052 Subacute cough: Secondary | ICD-10-CM | POA: Insufficient documentation

## 2021-04-11 DIAGNOSIS — R509 Fever, unspecified: Secondary | ICD-10-CM

## 2021-04-11 DIAGNOSIS — R051 Acute cough: Secondary | ICD-10-CM | POA: Diagnosis not present

## 2021-04-11 NOTE — Patient Instructions (Signed)
We are going to get a chest x-ray to evaluate for pneumonia.  If this is negative I will discuss neck steps with you which may include treatment for a sinus infection.  On exam you have a lot of wheezing which makes me think that this is either a pneumonia, worsening of asthma, or a new COPD diagnosis.  We need to evaluate further.  I like for you to schedule follow-up with me in 1 week.  If you develop any worsening symptoms, trouble breathing, high fevers that do not improve, or other concerning symptoms please go to the ER.

## 2021-04-12 LAB — CBC
Hematocrit: 43.4 % (ref 34.0–46.6)
Hemoglobin: 15 g/dL (ref 11.1–15.9)
MCH: 32.4 pg (ref 26.6–33.0)
MCHC: 34.6 g/dL (ref 31.5–35.7)
MCV: 94 fL (ref 79–97)
Platelets: 276 10*3/uL (ref 150–450)
RBC: 4.63 x10E6/uL (ref 3.77–5.28)
RDW: 12.3 % (ref 11.7–15.4)
WBC: 10.1 10*3/uL (ref 3.4–10.8)

## 2021-04-14 ENCOUNTER — Telehealth: Payer: Self-pay

## 2021-04-14 ENCOUNTER — Other Ambulatory Visit: Payer: Self-pay | Admitting: Family Medicine

## 2021-04-14 MED ORDER — AMOXICILLIN-POT CLAVULANATE 500-125 MG PO TABS: 1.0000 | ORAL_TABLET | Freq: Two times a day (BID) | ORAL | 0 refills | Status: DC

## 2021-04-14 NOTE — Telephone Encounter (Signed)
Patient calls nurse line requesting to discuss recent CXR and lab results with provider.   Patient reports that she has had improvement with cough and wheezing. However, she is still continuing to have headache, sinus pressure and green nasal drainage. Patient states that she discussed receiving an antibiotic for sinus infection on Friday with Dr. Vanessa [place].   Please advise.   Talbot Grumbling, RN

## 2021-04-17 NOTE — Progress Notes (Signed)
° ° °  SUBJECTIVE:   CHIEF COMPLAINT / HPI:   Follow-up-subacute cough rhinosinusitis : 46 year old female presenting for follow-up for subacute cough. At previous appointment we initiated the work-up including chest x-ray which was negative for acute cardiopulmonary findings.  CBC returned with normal white blood cell count.  Patient did have a history of intermittent asthma for which she uses albuterol and it was question whether this was a repeat viral infection versus new COPD versus worsening asthma.  Patient is a current smoker with a 15-year pack history. She states her symptoms have much improved. She will be finished with her antibiotics tomorrow. She still has some cough but it seems improving.  PERTINENT  PMH / PSH: Seasonal allergies.   OBJECTIVE:   Pulse 69    Ht 5\' 3"  (1.6 m)    Wt 162 lb (73.5 kg)    LMP 03/21/2021    BMI 28.70 kg/m    General: NAD, pleasant, able to participate in exam HEENT: No pharyngeal erythema Cardiac: RRR, no murmurs. Respiratory: CTAB, normal effort, No wheezes, rales or rhonchi Neuro: alert, no obvious focal deficits Psych: Normal affect and mood  ASSESSMENT/PLAN:   Subacute cough Rhinosinusitis: 46 year old female present for follow-up for subacute cough and diagnosis of rhinosinusitis.  After initiating the Augmentin she has much improvement of her symptoms.  Her cough still persist but is improving.  She has no shortness of breath.  We previously discussed considering PFT testing but were going to hold off at this time.  Recommend following up if her symptoms worsen or she has any new symptoms.  Discussed that she can always reach out if she wants to do PFT testing.   61, DO [facility]

## 2021-04-18 ENCOUNTER — Ambulatory Visit (INDEPENDENT_AMBULATORY_CARE_PROVIDER_SITE_OTHER): Payer: Medicaid Other | Admitting: Family Medicine

## 2021-04-18 ENCOUNTER — Encounter: Payer: Self-pay | Admitting: Family Medicine

## 2021-04-18 ENCOUNTER — Other Ambulatory Visit: Payer: Self-pay

## 2021-04-18 VITALS — BP 132/88 | HR 69 | Ht 63.0 in | Wt 162.0 lb

## 2021-04-18 DIAGNOSIS — J329 Chronic sinusitis, unspecified: Secondary | ICD-10-CM

## 2021-04-18 DIAGNOSIS — J310 Chronic rhinitis: Secondary | ICD-10-CM | POA: Diagnosis not present

## 2021-04-18 NOTE — Patient Instructions (Signed)
Your symptoms seem to be resolving and we do not need to do further testing at this time.  If you have further bouts of wheezing, frequent viral infections, or shortness of breath we should consider repeating pulmonary function testing which is lung testing.  Otherwise follow-up with me if you have any other questions or concerns.

## 2021-05-08 ENCOUNTER — Encounter: Payer: Self-pay | Admitting: Family Medicine

## 2021-05-09 ENCOUNTER — Telehealth: Payer: Self-pay | Admitting: Family Medicine

## 2021-05-09 NOTE — Telephone Encounter (Signed)
Error.  ?Gail Betzold, MD ? ?

## 2021-07-15 ENCOUNTER — Other Ambulatory Visit: Payer: Self-pay

## 2021-07-15 ENCOUNTER — Ambulatory Visit (HOSPITAL_COMMUNITY)
Admission: EM | Admit: 2021-07-15 | Discharge: 2021-07-15 | Disposition: A | Discharge status: Home / Self Care | Payer: Medicaid Other | Attending: Student | Admitting: Student

## 2021-07-15 ENCOUNTER — Encounter (HOSPITAL_COMMUNITY): Payer: Self-pay | Admitting: *Deleted

## 2021-07-15 DIAGNOSIS — Z77120 Contact with and (suspected) exposure to mold (toxic): Secondary | ICD-10-CM | POA: Diagnosis not present

## 2021-07-15 DIAGNOSIS — Z20822 Contact with and (suspected) exposure to covid-19: Secondary | ICD-10-CM | POA: Insufficient documentation

## 2021-07-15 DIAGNOSIS — J0100 Acute maxillary sinusitis, unspecified: Secondary | ICD-10-CM

## 2021-07-15 DIAGNOSIS — J301 Allergic rhinitis due to pollen: Secondary | ICD-10-CM

## 2021-07-15 DIAGNOSIS — B349 Viral infection, unspecified: Secondary | ICD-10-CM | POA: Diagnosis not present

## 2021-07-15 DIAGNOSIS — J4520 Mild intermittent asthma, uncomplicated: Secondary | ICD-10-CM

## 2021-07-15 DIAGNOSIS — J4521 Mild intermittent asthma with (acute) exacerbation: Secondary | ICD-10-CM | POA: Diagnosis not present

## 2021-07-15 MED ORDER — PREDNISONE 20 MG PO TABS: 40.0000 mg | ORAL_TABLET | Freq: Every day | ORAL | 0 refills | Status: AC

## 2021-07-15 MED ORDER — ALBUTEROL SULFATE HFA 108 (90 BASE) MCG/ACT IN AERS: 2.0000 | INHALATION_SPRAY | RESPIRATORY_TRACT | 0 refills | Status: DC | PRN

## 2021-07-15 MED ORDER — ALBUTEROL SULFATE (2.5 MG/3ML) 0.083% IN NEBU
2.5000 mg | INHALATION_SOLUTION | Freq: Once | RESPIRATORY_TRACT | Status: AC
  Administered 2021-07-15: 2.5 mg via RESPIRATORY_TRACT

## 2021-07-15 MED ORDER — ALBUTEROL SULFATE (2.5 MG/3ML) 0.083% IN NEBU
INHALATION_SOLUTION | RESPIRATORY_TRACT | Status: AC
  Filled 2021-07-15: qty 3

## 2021-07-15 MED ORDER — AMOXICILLIN-POT CLAVULANATE 875-125 MG PO TABS: 1.0000 | ORAL_TABLET | Freq: Two times a day (BID) | ORAL | 0 refills | Status: DC

## 2021-07-15 NOTE — ED Provider Notes (Signed)
?[facility] ? ? ? ?CSN: [id number] ?Arrival date & time: 07/15/21  [id number] ? ? ?  ? ?History   ?Chief Complaint ?Chief Complaint  ?Patient presents with  ? Sore Throat  ? Headache  ? Facial Pain  ? Cough  ? ? ?HPI ?Gail Bass is a 46 y.o. female presenting with viral syndrome for about 5 days.  History former smoker, asthma.  She describes 5 days of headaches, sore throat, wheezing, facial pain, nasal congestion, subjective chills.  She states that for her asthma, she is using the albuterol inhaler every 4-6 hours with minimal relief.  The cough is productive of yellow and green sputum, and the nasal congestion is getting increasingly purulent with some nasal pressure.  She incidentally notes that she has been exposed to mold in her apartment, this has been treated but she is concerned that she has infection related to this.  She does endorse some dyspnea, particularly with coughing and exertion, but no chest pain.  Has also attempted NyQuil to help sleep at night. Taking cetirizine and flonase for allergic component, as she has been spending time outside. ? ?HPI ? ?Past Medical History:  ?Diagnosis Date  ? Abscess of buttock 08/13/2019  ? Asthma   ? Bacterial vaginosis   ? Boil 06/14/2019  ? BV (bacterial vaginosis) 09/13/2008  ?     ? Hypertension 05/11/2013  ? Kidney stone   ? OVARIAN CYST, LEFT 10/29/2008  ? Qualifier: Diagnosis of  By: Lanier PrudeBolden  MD, Cathrine Musteraineisha    ? Pain in the chest 06/06/2015  ? Painful lumpy right breast 03/21/2013  ? Recurrent cold sores 12/05/2015  ? Recurrent sinusitis 06/06/2015  ? Sinus congestion 04/25/2018  ? TUBAL PREGNANCY 09/13/2008  ? Qualifier: History of  By: Lanier PrudeBolden  MD, Cathrine Musteraineisha    ? Vaginal discharge 01/10/2014  ? Vaginal discharge 01/10/2014  ? ? ?Patient Active Problem List  ? Diagnosis Date Noted  ? Viral illness 02/27/2021  ? Back pain 02/13/2021  ? Overactive bladder 07/03/2020  ? Levator spasm 07/03/2020  ? Dyspareunia, female 07/03/2020  ? Mixed stress and urge urinary  incontinence 04/11/2020  ? History of COVID-19 03/13/2020  ? Seasonal allergies 06/14/2019  ? PTSD (post-traumatic stress disorder) 12/05/2015  ? External hemorrhoids 08/07/2015  ? Vaginal discharge 01/10/2014  ? Hypertension 05/11/2013  ? Tobacco use disorder 03/21/2013  ? ASTHMA, INTERMITTENT 12/23/2009  ? MIGRAINE HEADACHE 09/13/2008  ? MENORRHAGIA 09/13/2008  ? Depression 06/21/2008  ? ? ?Past Surgical History:  ?Procedure Laterality Date  ? CYSTECTOMY    ? CYSTOSCOPY WITH RETROGRADE PYELOGRAM, URETEROSCOPY AND STENT PLACEMENT Left 02/22/2014  ? Procedure: CYSTOSCOPY WITH RETROGRADE PYELOGRAM, URETEROSCOPY, STONE BASKET EXTRACTION ;  Surgeon: Kathi LudwigSigmund I Tannenbaum, MD;  Location: [facility];  Service: Urology;  Laterality: Left;  ? tailbone cyst    ? TUBAL LIGATION    ? ? ?OB History   ? ? Gravida  ?6  ? Para  ?3  ? Term  ?   ? Preterm  ?   ? AB  ?3  ? Living  ?3  ?  ? ? SAB  ?3  ? IAB  ?   ? Ectopic  ?   ? Multiple  ?   ? Live Births  ?   ?   ?  ?  ? ? ? ?Home Medications   ? ?Prior to Admission medications   ?Medication Sig Start Date End Date Taking? Authorizing Provider  ?amoxicillin-clavulanate (AUGMENTIN) 875-125 MG tablet Take  1 tablet by mouth every 12 (twelve) hours. 07/15/21  Yes Rhys Martini, PA-C  ?predniSONE (DELTASONE) 20 MG tablet Take 2 tablets (40 mg total) by mouth daily for 5 days. Take with breakfast or lunch. Avoid NSAIDs (ibuprofen, etc) while taking this medication. 07/15/21 07/20/21 Yes Rhys Martini, PA-C  ?acyclovir (ZOVIRAX) 400 MG tablet Take 1 tablet (400 mg total) by mouth 2 (two) times daily. 03/20/21   Shirlean Mylar, MD  ?albuterol (VENTOLIN HFA) 108 (90 Base) MCG/ACT inhaler Inhale 2 puffs into the lungs every 4 (four) hours as needed for wheezing or shortness of breath. 07/15/21   Rhys Martini, PA-C  ?amLODipine (NORVASC) 10 MG tablet Take 1 tablet (10 mg total) by mouth at bedtime. 02/13/21   Erick Alley, DO  ?benzonatate (TESSALON) 100 MG capsule Take 1-2 capsules (100-200  mg total) by mouth 3 (three) times daily as needed for cough. 01/07/21   Wallis Bamberg, PA-C  ?cetirizine (ZYRTEC) 10 MG tablet TAKE 1 TABLET(10 MG) BY MOUTH DAILY 03/20/21   Shirlean Mylar, MD  ?fluticasone [facility]) 50 MCG/ACT nasal spray Place 2 sprays into both nostrils daily. 03/20/21   Shirlean Mylar, MD  ?hydrocortisone (ANUSOL-HC) 2.5 % rectal cream Place 1 application rectally 2 (two) times daily. 11/21/19   Sandre Kitty, MD  ?ibuprofen (ADVIL) 600 MG tablet Take 1 tablet (600 mg total) by mouth every 6 (six) hours as needed. 08/13/19   McDonald, Mia A, PA-C  ?losartan (COZAAR) 25 MG tablet Take 1 tablet (25 mg total) by mouth daily. 03/20/21   Shirlean Mylar, MD  ?Lysine 1000 MG TABS Take 1 tablet (1,000 mg total) by mouth daily. 12/05/15   Uvaldo Rising, MD  ?montelukast (SINGULAIR) 10 MG tablet Take 1 tablet (10 mg total) by mouth at bedtime. 06/13/19   Shirley, [place], DO  ?nicotine (NICODERM CQ - DOSED IN MG/24 HOURS) 14 mg/24hr patch Place 1 patch (14 mg total) onto the skin in the morning. 03/20/21   Westley Chandler, MD  ?promethazine-dextromethorphan (PROMETHAZINE-DM) 6.25-15 MG/5ML syrup Take 5 mLs by mouth at bedtime as needed for cough. 01/07/21   Wallis Bamberg, PA-C  ?solifenacin (VESICARE) 5 MG tablet Take 1 tablet (5 mg total) by mouth daily. 05/28/20   Marguerita Beards, MD  ? ? ?Family History ?Family History  ?Problem Relation Age of Onset  ? Arrhythmia Father   ?     Pacemakers placed didn't take passed away 3 moths later  ? Arrhythmia Sister   ?     Pacemaker didn't take to body passed away 3 months ;later  ? Hypertension Maternal Aunt   ? Heart failure Maternal Grandmother   ? Hypertension Maternal Grandfather   ? Diabetes Other   ? CAD Other   ? Heart Problems Mother   ? Heart failure Mother   ? ? ?Social History ?Social History  ? ?Tobacco Use  ? Smoking status: Former  ?  Packs/day: 0.50  ?  Types: Cigarettes  ?  Start date: 11/19/2012  ? Smokeless tobacco: Never  ?Vaping Use  ? Vaping  Use: Never used  ?Substance Use Topics  ? Alcohol use: No  ? Drug use: Yes  ?  Types: Marijuana  ? ? ? ?Allergies   ?Doxycycline and Lorcet [hydrocodone-acetaminophen] ? ? ?Review of Systems ?Review of Systems  ?Constitutional:  Negative for appetite change, chills and fever.  ?HENT:  Positive for congestion, sinus pressure and sore throat. Negative for ear pain, rhinorrhea and sinus pain.   ?  Eyes:  Negative for redness and visual disturbance.  ?Respiratory:  Positive for cough, shortness of breath and wheezing. Negative for chest tightness.   ?Cardiovascular:  Negative for chest pain and palpitations.  ?Gastrointestinal:  Negative for abdominal pain, constipation, diarrhea, nausea and vomiting.  ?Genitourinary:  Negative for dysuria, frequency and urgency.  ?Musculoskeletal:  Negative for myalgias.  ?Neurological:  Negative for dizziness, weakness and headaches.  ?Psychiatric/Behavioral:  Negative for confusion.   ?All other systems reviewed and are negative. ? ? ?Physical Exam ?Triage Vital Signs ?ED Triage Vitals  ?Enc Vitals Group  ?   BP 07/15/21 0826 (!) 148/101  ?   Pulse Rate 07/15/21 0826 86  ?   Resp 07/15/21 0826 18  ?   Temp 07/15/21 0826 98.8 ?F (37.1 ?C)  ?   Temp src --   ?   SpO2 07/15/21 0826 96 %  ?   Weight --   ?   Height --   ?   Head Circumference --   ?   Peak Flow --   ?   Pain Score 07/15/21 0824 7  ?   Pain Loc --   ?   Pain Edu? --   ?   Excl. in GC? --   ? ?No data found. ? ?Updated Vital Signs ?BP (!) 148/101   Pulse 86   Temp 98.8 ?F (37.1 ?C)   Resp 18   LMP 06/14/2021   SpO2 96%  ? ?Visual Acuity ?Right Eye Distance:   ?Left Eye Distance:   ?Bilateral Distance:   ? ?Right Eye Near:   ?Left Eye Near:    ?Bilateral Near:    ? ?Physical Exam ?Vitals reviewed.  ?Constitutional:   ?   General: She is not in acute distress. ?   Appearance: Normal appearance. She is not ill-appearing.  ?HENT:  ?   Head: Normocephalic and atraumatic.  ?   Right Ear: Tympanic membrane, ear canal and  external ear normal. No tenderness. No middle ear effusion. There is no impacted cerumen. Tympanic membrane is not perforated, erythematous, retracted or bulging.  ?   Left Ear: Tympanic membrane, ear canal and e

## 2021-07-15 NOTE — ED Triage Notes (Signed)
T reports since Friday she has had HA,sore throat,wheezing,facial pain. Pt reports her daughter has been in Hospital and Pt is worried she has picked up a virus . Pt also reports her Apt. Has been treated for mold. Pt has a Hx of asthma . ?

## 2021-07-15 NOTE — Discharge Instructions (Addendum)
-  Start the antibiotic-Augmentin (amoxicillin-clavulanate), 1 pill every 12 hours for 7 days.  You can take this with food like with breakfast and dinner. ?-Albuterol inhaler as needed for cough, wheezing, shortness of breath, 1 to 2 puffs every 6 hours as needed. ?-Prednisone, 2 pills taken at the same time for 5 days in a row.  Try taking this earlier in the day as it can give you energy. Avoid NSAIDs like ibuprofen and alleve while taking this medication as they can increase your risk of stomach upset and even GI bleeding when in combination with a steroid. You can continue tylenol (acetaminophen) up to 1000mg  3x daily. ?-Continue nyquil, cetirzine, and flonase ?-Follow-up if symptoms worsen/persist  ?

## 2021-07-16 LAB — SARS CORONAVIRUS 2 (TAT 6-24 HRS): SARS Coronavirus 2: NEGATIVE

## 2021-07-20 ENCOUNTER — Other Ambulatory Visit: Payer: Self-pay

## 2021-07-20 ENCOUNTER — Ambulatory Visit (HOSPITAL_COMMUNITY)
Admission: EM | Admit: 2021-07-20 | Discharge: 2021-07-20 | Disposition: A | Discharge status: Home / Self Care | Payer: Medicaid Other | Attending: Urgent Care | Admitting: Urgent Care

## 2021-07-20 ENCOUNTER — Ambulatory Visit (INDEPENDENT_AMBULATORY_CARE_PROVIDER_SITE_OTHER): Payer: Medicaid Other

## 2021-07-20 ENCOUNTER — Encounter (HOSPITAL_COMMUNITY): Payer: Self-pay | Admitting: *Deleted

## 2021-07-20 DIAGNOSIS — R059 Cough, unspecified: Secondary | ICD-10-CM

## 2021-07-20 DIAGNOSIS — R0602 Shortness of breath: Secondary | ICD-10-CM

## 2021-07-20 DIAGNOSIS — R051 Acute cough: Secondary | ICD-10-CM

## 2021-07-20 DIAGNOSIS — J4530 Mild persistent asthma, uncomplicated: Secondary | ICD-10-CM

## 2021-07-20 DIAGNOSIS — R062 Wheezing: Secondary | ICD-10-CM

## 2021-07-20 DIAGNOSIS — J4531 Mild persistent asthma with (acute) exacerbation: Secondary | ICD-10-CM

## 2021-07-20 DIAGNOSIS — J309 Allergic rhinitis, unspecified: Secondary | ICD-10-CM

## 2021-07-20 DIAGNOSIS — J019 Acute sinusitis, unspecified: Secondary | ICD-10-CM

## 2021-07-20 MED ORDER — METHYLPREDNISOLONE ACETATE 80 MG/ML IJ SUSP
80.0000 mg | Freq: Once | INTRAMUSCULAR | Status: AC
  Administered 2021-07-20: 80 mg via INTRAMUSCULAR

## 2021-07-20 MED ORDER — AMOXICILLIN-POT CLAVULANATE 875-125 MG PO TABS: 1.0000 | ORAL_TABLET | Freq: Two times a day (BID) | ORAL | 0 refills | Status: DC

## 2021-07-20 MED ORDER — AEROCHAMBER PLUS FLO-VU LARGE MISC
Status: AC
  Filled 2021-07-20: qty 1

## 2021-07-20 MED ORDER — PROMETHAZINE-DM 6.25-15 MG/5ML PO SYRP: 5.0000 mL | ORAL_SOLUTION | Freq: Three times a day (TID) | ORAL | 0 refills | Status: DC | PRN

## 2021-07-20 MED ORDER — ALBUTEROL SULFATE (5 MG/ML) 0.5% IN NEBU: 2.5000 mg | INHALATION_SOLUTION | Freq: Four times a day (QID) | RESPIRATORY_TRACT | 12 refills | Status: DC | PRN

## 2021-07-20 MED ORDER — AEROCHAMBER PLUS FLO-VU LARGE MISC
1.0000 | Freq: Once | Status: AC
  Administered 2021-07-20: 1

## 2021-07-20 MED ORDER — AMOXICILLIN-POT CLAVULANATE 875-125 MG PO TABS
1.0000 | ORAL_TABLET | Freq: Two times a day (BID) | ORAL | 0 refills | Status: DC
Start: 2021-07-20 — End: 2021-07-20

## 2021-07-20 MED ORDER — METHYLPREDNISOLONE ACETATE 80 MG/ML IJ SUSP
INTRAMUSCULAR | Status: AC
  Filled 2021-07-20: qty 1

## 2021-07-20 MED ORDER — ALBUTEROL SULFATE (2.5 MG/3ML) 0.083% IN NEBU: 2.5000 mg | INHALATION_SOLUTION | Freq: Four times a day (QID) | RESPIRATORY_TRACT | 0 refills | Status: DC | PRN

## 2021-07-20 NOTE — ED Provider Notes (Signed)
[name]   MRN: [id number] DOB: [birth year]  Subjective:   Gail Bass is a 46 y.o. female with pmh of asthma presenting for recheck.  Has ongoing cough, shortness of breath and wheezing, fatigue, malaise.  Patient has a history of asthma, recurrent sinusitis, allergic rhinitis.  Patient was seen on 07/15/2021 and was prescribed Augmentin, prednisone and supportive care medications to cover for acute sinusitis in the context of her allergic rhinitis and asthma.  She has concerns about Pseudomonas as her daughter was diagnosed with this but admits that this was from a severe infection as a result of ruptured appendicitis.  No history of HIV, COPD. She is smoking again and 1/2 pack/day but has not for the past week. No recent hospitalizations.   No current facility-administered medications for this encounter.  Current Outpatient Medications:    acyclovir (ZOVIRAX) 400 MG tablet, Take 1 tablet (400 mg total) by mouth 2 (two) times daily., Disp: 60 tablet, Rfl: 2   albuterol (VENTOLIN HFA) 108 (90 Base) MCG/ACT inhaler, Inhale 2 puffs into the lungs every 4 (four) hours as needed for wheezing or shortness of breath., Disp: 18 g, Rfl: 0   amLODipine (NORVASC) 10 MG tablet, Take 1 tablet (10 mg total) by mouth at bedtime., Disp: 90 tablet, Rfl: 3   amoxicillin-clavulanate (AUGMENTIN) 875-125 MG tablet, Take 1 tablet by mouth every 12 (twelve) hours., Disp: 14 tablet, Rfl: 0   benzonatate (TESSALON) 100 MG capsule, Take 1-2 capsules (100-200 mg total) by mouth 3 (three) times daily as needed for cough., Disp: 60 capsule, Rfl: 0   cetirizine (ZYRTEC) 10 MG tablet, TAKE 1 TABLET(10 MG) BY MOUTH DAILY, Disp: 90 tablet, Rfl: 1   fluticasone (FLONASE) 50 MCG/ACT nasal spray, Place 2 sprays into both nostrils daily., Disp: 16 g, Rfl: 12   hydrocortisone (ANUSOL-HC) 2.5 % rectal cream, Place 1 application rectally 2 (two) times daily., Disp: 30 g, Rfl: 1   ibuprofen (ADVIL) 600 MG  tablet, Take 1 tablet (600 mg total) by mouth every 6 (six) hours as needed., Disp: 30 tablet, Rfl: 0   losartan (COZAAR) 25 MG tablet, Take 1 tablet (25 mg total) by mouth daily., Disp: 30 tablet, Rfl: 3   Lysine 1000 MG TABS, Take 1 tablet (1,000 mg total) by mouth daily., Disp: , Rfl: 0   montelukast (SINGULAIR) 10 MG tablet, Take 1 tablet (10 mg total) by mouth at bedtime., Disp: 30 tablet, Rfl: 0   nicotine (NICODERM CQ - DOSED IN MG/24 HOURS) 14 mg/24hr patch, Place 1 patch (14 mg total) onto the skin in the morning., Disp: 14 patch, Rfl: 3   predniSONE (DELTASONE) 20 MG tablet, Take 2 tablets (40 mg total) by mouth daily for 5 days. Take with breakfast or lunch. Avoid NSAIDs (ibuprofen, etc) while taking this medication., Disp: 10 tablet, Rfl: 0   promethazine-dextromethorphan (PROMETHAZINE-DM) 6.25-15 MG/5ML syrup, Take 5 mLs by mouth at bedtime as needed for cough., Disp: 100 mL, Rfl: 0   solifenacin (VESICARE) 5 MG tablet, Take 1 tablet (5 mg total) by mouth daily., Disp: 30 tablet, Rfl: 5   Allergies  Allergen Reactions   Doxycycline     REACTION: Nausea   Lorcet [Hydrocodone-Acetaminophen] Nausea And Vomiting and Rash    Past Medical History:  Diagnosis Date   Abscess of buttock 08/13/2019   Asthma    Bacterial vaginosis    Boil 06/14/2019   BV (bacterial vaginosis) 09/13/2008  Hypertension 05/11/2013   Kidney stone    OVARIAN CYST, LEFT 10/29/2008   Qualifier: Diagnosis of  By: Lanier Prude  MD, Taineisha     Pain in the chest 06/06/2015   Painful lumpy right breast 03/21/2013   Recurrent cold sores 12/05/2015   Recurrent sinusitis 06/06/2015   Sinus congestion 04/25/2018   TUBAL PREGNANCY 09/13/2008   Qualifier: History of  By: Lanier Prude  MD, Taineisha     Vaginal discharge 01/10/2014   Vaginal discharge 01/10/2014     Past Surgical History:  Procedure Laterality Date   CYSTECTOMY     CYSTOSCOPY WITH RETROGRADE PYELOGRAM, URETEROSCOPY AND STENT PLACEMENT Left 02/22/2014    Procedure: CYSTOSCOPY WITH RETROGRADE PYELOGRAM, URETEROSCOPY, STONE BASKET EXTRACTION ;  Surgeon: Kathi Ludwig, MD;  Location: [facility];  Service: Urology;  Laterality: Left;   tailbone cyst     TUBAL LIGATION      Family History  Problem Relation Age of Onset   Arrhythmia Father        Pacemakers placed didn't take passed away 3 moths later   Arrhythmia Sister        Pacemaker didn't take to body passed away 3 months ;later   Hypertension Maternal Aunt    Heart failure Maternal Grandmother    Hypertension Maternal Grandfather    Diabetes Other    CAD Other    Heart Problems Mother    Heart failure Mother     Social History   Tobacco Use   Smoking status: Former    Packs/day: 0.50    Types: Cigarettes    Start date: 11/19/2012   Smokeless tobacco: Never  Vaping Use   Vaping Use: Never used  Substance Use Topics   Alcohol use: No   Drug use: Yes    Types: Marijuana    ROS   Objective:   Vitals: BP (!) 162/95   Pulse 75   Temp 98.4 F (36.9 C)   Resp 18   LMP 07/15/2021   SpO2 97%   Physical Exam Constitutional:      General: She is not in acute distress.    Appearance: Normal appearance. She is well-developed. She is not ill-appearing, toxic-appearing or diaphoretic.  HENT:     Head: Normocephalic and atraumatic.     Nose: Nose normal.     Mouth/Throat:     Mouth: Mucous membranes are moist.  Eyes:     General: No scleral icterus.       Right eye: No discharge.        Left eye: No discharge.     Extraocular Movements: Extraocular movements intact.  Cardiovascular:     Rate and Rhythm: Normal rate and regular rhythm.     Heart sounds: Normal heart sounds. No murmur heard.   No friction rub. No gallop.  Pulmonary:     Effort: Pulmonary effort is normal. No respiratory distress.     Breath sounds: No stridor. Examination of the right-upper field reveals wheezing. Examination of the left-upper field reveals wheezing. Examination of the  right-middle field reveals wheezing and rhonchi. Examination of the left-middle field reveals wheezing and rhonchi. Examination of the right-lower field reveals wheezing. Examination of the left-lower field reveals wheezing. Wheezing and rhonchi present. No rales.  Chest:     Chest wall: No tenderness.  Skin:    General: Skin is warm and dry.  Neurological:     General: No focal deficit present.     Mental Status: She is alert and oriented  to person, place, and time.  Psychiatric:        Mood and Affect: Mood normal.        Behavior: Behavior normal.   DG Chest 2 View  Result Date: 07/20/2021 CLINICAL DATA:  Cough.  Shortness of breath.  Wheezing. EXAM: CHEST - 2 VIEW COMPARISON:  04/11/2021 FINDINGS: The heart size and mediastinal contours are within normal limits. Both lungs are clear. The visualized skeletal structures are unremarkable. IMPRESSION: No active cardiopulmonary disease. Electronically Signed   By: Danae OrleansJohn A Stahl M.D.   On: 07/20/2021 17:18    IM Depo-Medrol 80 mg.  Assessment and Plan :   PDMP not reviewed this encounter.  1. Mild persistent asthma without complication   2. Acute cough   3. Shortness of breath   4. Wheezing   5. Allergic rhinitis, unspecified seasonality, unspecified trigger   6. Acute non-recurrent sinusitis, unspecified location    I assured patient that I have low suspicion for Pseudomonas infection from her daughter.  Chest x-ray is normal.  However her lung exam has diffuse wheezing and some rhonchi.  Recommended IM Depo-Medrol that she has this despite the oral prednisone which she took.  I will also add in 3 more days of the Augmentin.  Recommend supportive care otherwise.  I did provide her with a prescription for nebulized albuterol and a mask since she has been using her daughters. Counseled patient on potential for adverse effects with medications prescribed/recommended today, ER and return-to-clinic precautions discussed, patient verbalized  understanding.    Wallis BambergMani, Chimere Klingensmith, [place] JerseyPA-C 07/20/21 1742

## 2021-07-20 NOTE — ED Triage Notes (Signed)
Pt was seen on 07-15-21 and started on ant-bx. Pt has been taking care of her daughter who has been DX with Pseudomonas Aeruginosa. Pt has concerns she may have same since she has been with her daughter in the hospital and at home. Pt continues to have wheezing,cough,shortness of breath and fatigue.

## 2021-08-05 ENCOUNTER — Encounter: Payer: Self-pay | Admitting: *Deleted

## 2021-08-27 ENCOUNTER — Ambulatory Visit (INDEPENDENT_AMBULATORY_CARE_PROVIDER_SITE_OTHER): Payer: Medicaid Other | Admitting: Family Medicine

## 2021-08-27 ENCOUNTER — Encounter: Payer: Self-pay | Admitting: Family Medicine

## 2021-08-27 ENCOUNTER — Other Ambulatory Visit (HOSPITAL_COMMUNITY): Payer: Self-pay

## 2021-08-27 VITALS — BP 146/83 | HR 79 | Ht 62.75 in | Wt 167.6 lb

## 2021-08-27 DIAGNOSIS — H02823 Cysts of right eye, unspecified eyelid: Secondary | ICD-10-CM | POA: Diagnosis present

## 2021-08-27 DIAGNOSIS — B001 Herpesviral vesicular dermatitis: Secondary | ICD-10-CM

## 2021-08-27 DIAGNOSIS — I10 Essential (primary) hypertension: Secondary | ICD-10-CM

## 2021-08-27 DIAGNOSIS — J302 Other seasonal allergic rhinitis: Secondary | ICD-10-CM | POA: Diagnosis not present

## 2021-08-27 MED ORDER — HYDROCORTISONE ACETATE 25 MG RE SUPP: 25.0000 mg | Freq: Two times a day (BID) | RECTAL | 0 refills | Status: DC

## 2021-08-27 MED ORDER — NIFEDIPINE ER OSMOTIC RELEASE 30 MG PO TB24: 30.0000 mg | ORAL_TABLET | Freq: Every day | ORAL | 1 refills | Status: DC

## 2021-08-27 MED ORDER — HYDROCORTISONE (PERIANAL) 2.5 % EX CREA: 1.0000 | TOPICAL_CREAM | Freq: Two times a day (BID) | CUTANEOUS | 1 refills | Status: DC

## 2021-08-27 MED ORDER — CETIRIZINE HCL 10 MG PO TABS: ORAL_TABLET | ORAL | 1 refills | Status: DC

## 2021-08-27 MED ORDER — ACYCLOVIR 400 MG PO TABS: 400.0000 mg | ORAL_TABLET | Freq: Two times a day (BID) | ORAL | 2 refills | Status: DC

## 2021-08-27 NOTE — Assessment & Plan Note (Signed)
Chronic, 1 year duration.  Bothersome and pruritic.  Patient has tried and failed conservative home management.  Given location near her eye, our dermatology clinic would not be comfortable performing excision.  Will refer to dermatology for excision and management.

## 2021-08-27 NOTE — Progress Notes (Signed)
    SUBJECTIVE:   CHIEF COMPLAINT / HPI:   Hypertension - Unable to tolerate amlodipine dose due to headaches - Instructed to start losartan 25 mg 03/20/21, however patient never started due to concern of repeat lab testing for kidney function - Currently endorses periodic headache, morning dizziness first thing which she believes is due to her elevated blood pressure - Denies blurred vision, difficulty with speech, focal neurological deficits  Eye complaint Right lateral white bumps, 3 in total Itchy sometimes One year duration Has tried retinol, face masks, creams without relief Wonders if we can do a procedure to remove or prescribe a cream  PERTINENT  PMH / PSH: Hypertension, migraine, depressed mood overactive bladder, menorrhagia, mixed urinary incontinence, levator spasm  OBJECTIVE:   BP (!) 146/83   Pulse 79   Ht 5' 2.75" (1.594 m)   Wt 167 lb 9.6 oz (76 kg)   SpO2 98%   BMI 29.93 kg/m    PHQ-9:     08/27/2021    8:37 AM 04/18/2021    9:23 AM 04/11/2021    2:18 PM  Depression screen PHQ 2/9  Decreased Interest 0 0 1  Down, Depressed, Hopeless 0 0 1  PHQ - 2 Score 0 0 2  Altered sleeping 1 0 1  Tired, decreased energy 1 0 1  Change in appetite 0 0 1  Feeling bad or failure about yourself  0 0 0  Trouble concentrating 0 0 0  Moving slowly or fidgety/restless 0 0 0  Suicidal thoughts 0 0 0  PHQ-9 Score 2 0 5  Difficult doing work/chores Not difficult at all Not difficult at all Somewhat difficult   Physical Exam General: Awake, alert, oriented HEENT: Sclera anicteric, EOM intact, 3 discrete pinpoint closed comedones of right lateral inferior eyelid without surrounding erythema or TTP Cardiovascular: Regular rate and rhythm, S1 and S2 present, no murmurs auscultated Respiratory: Lung fields clear to auscultation bilaterally  ASSESSMENT/PLAN:   Hypertension Moderately elevated.  Patient never started losartan given concerns for repeat kidney testing and  possible side effects with sun exposure.  At this time, she prefers to try another medication instead of doing 24-hour ambulatory BP monitor.  We will trial nifedipine XR 30 mg daily.  Return in 2 weeks for BP check and follow-up.  Milia of right eyelid Chronic, 1 year duration.  Bothersome and pruritic.  Patient has tried and failed conservative home management.  Given location near her eye, our dermatology clinic would not be comfortable performing excision.  Will refer to dermatology for excision and management.     Fayette Pho, MD [facility]

## 2021-08-27 NOTE — Patient Instructions (Signed)
It was wonderful to see you today. Thank you for allowing me to be a part of your care. Below is a short summary of what we discussed at your visit today:  Blood pressure Start the nifedipine (Procardia XL) 30 mg tablet daily at bedtime. Come back in 1 to 2 weeks for blood pressure recheck. Stop medication if develop headaches  Cyst under eye Unfortunately it is too close to the eye for [place] to do here I have referred you to dermatology, submitted in their office should be calling you.  Please let us know if they do not call you within 1 to 2 weeks to make an appointment.  Refills I have sent in the requested refills.  They should be available at your pharmacy soon.  Please bring all of your medications to every appointment!  If you have any questions or concerns, please do not hesitate to contact us via phone or MyChart message.   Fayette Pho, MD

## 2021-08-27 NOTE — Assessment & Plan Note (Signed)
Moderately elevated.  Patient never started losartan given concerns for repeat kidney testing and possible side effects with sun exposure.  At this time, she prefers to try another medication instead of doing 24-hour ambulatory BP monitor.  We will trial nifedipine XR 30 mg daily.  Return in 2 weeks for BP check and follow-up.

## 2021-12-22 ENCOUNTER — Encounter: Payer: Self-pay | Admitting: *Deleted

## 2022-01-16 ENCOUNTER — Ambulatory Visit (HOSPITAL_COMMUNITY)
Admission: EM | Admit: 2022-01-16 | Discharge: 2022-01-16 | Disposition: A | Discharge status: Home / Self Care | Payer: Medicaid Other | Attending: Internal Medicine | Admitting: Internal Medicine

## 2022-01-16 ENCOUNTER — Other Ambulatory Visit: Payer: Self-pay

## 2022-01-16 ENCOUNTER — Other Ambulatory Visit (HOSPITAL_COMMUNITY): Payer: Self-pay

## 2022-01-16 ENCOUNTER — Encounter (HOSPITAL_COMMUNITY): Payer: Self-pay | Admitting: *Deleted

## 2022-01-16 DIAGNOSIS — J019 Acute sinusitis, unspecified: Secondary | ICD-10-CM

## 2022-01-16 MED ORDER — PROMETHAZINE HCL 25 MG PO TABS: 25.0000 mg | ORAL_TABLET | Freq: Three times a day (TID) | ORAL | 0 refills | Status: DC | PRN

## 2022-01-16 MED ORDER — AMOXICILLIN-POT CLAVULANATE 875-125 MG PO TABS: 1.0000 | ORAL_TABLET | Freq: Two times a day (BID) | ORAL | 0 refills | Status: DC

## 2022-01-16 MED ORDER — PROMETHAZINE-DM 6.25-15 MG/5ML PO SYRP: 5.0000 mL | ORAL_SOLUTION | Freq: Four times a day (QID) | ORAL | 0 refills | Status: DC | PRN

## 2022-01-16 MED ORDER — PREDNISONE 10 MG PO TABS: 20.0000 mg | ORAL_TABLET | Freq: Every day | ORAL | 0 refills | Status: AC

## 2022-01-16 NOTE — Discharge Instructions (Addendum)
Please continue using the humidifier and VapoRub use Take medications as prescribed Continue nasal spray and saline nasal rinses If you have worsening symptoms please return to urgent care to be reevaluated.

## 2022-01-16 NOTE — ED Provider Notes (Signed)
[facility]    CSN: [id number] Arrival date & time: 01/16/22  1346      History   Chief Complaint Chief Complaint  Patient presents with   Nasal Congestion    HPI Gail Bass is a 46 y.o. female with a history of asthma and environmental allergens comes to urgent care with a 5-week history of persistent nasal congestion productive of yellowish sputum, postnasal drainage and chesty cough.  Patient's symptoms started about 5 weeks ago with flulike symptoms including sore throat and generalized body aches.  Generalized body aches and sore throat subsided and patient initially had some relief from the nasal symptoms.  Patient subsequently started having worsening sinus pressure, throbbing headache and eye pain.  Patient denies any fever or chills.  No nausea, vomiting or diarrhea.  Patient continues to have itchy eyes, ears and nose as well as throat.  She has been using Flonase, Zyrtec, saline nasal rinse, humidifier with menthol.  In spite of all the remedies patient has not experienced any improvement in her symptoms of her symptoms.  She denies any shortness of breath or chest tightness.  She has intermittent wheezing.   HPI  Past Medical History:  Diagnosis Date   Abscess of buttock 08/13/2019   Asthma    Bacterial vaginosis    Boil 06/14/2019   BV (bacterial vaginosis) 09/13/2008        Hypertension 05/11/2013   Kidney stone    OVARIAN CYST, LEFT 10/29/2008   Qualifier: Diagnosis of  By: Lanier Prude  MD, Taineisha     Pain in the chest 06/06/2015   Painful lumpy right breast 03/21/2013   Recurrent cold sores 12/05/2015   Recurrent sinusitis 06/06/2015   Sinus congestion 04/25/2018   TUBAL PREGNANCY 09/13/2008   Qualifier: History of  By: Lanier Prude  MD, Taineisha     Vaginal discharge 01/10/2014   Vaginal discharge 01/10/2014    Patient Active Problem List   Diagnosis Date Noted   Milia of right eyelid 08/27/2021   Viral illness 02/27/2021   Back pain 02/13/2021    Overactive bladder 07/03/2020   Levator spasm 07/03/2020   Dyspareunia, female 07/03/2020   Mixed stress and urge urinary incontinence 04/11/2020   History of COVID-19 03/13/2020   Seasonal allergies 06/14/2019   PTSD (post-traumatic stress disorder) 12/05/2015   External hemorrhoids 08/07/2015   Vaginal discharge 01/10/2014   Hypertension 05/11/2013   Tobacco use disorder 03/21/2013   ASTHMA, INTERMITTENT 12/23/2009   MIGRAINE HEADACHE 09/13/2008   MENORRHAGIA 09/13/2008   Depression 06/21/2008    Past Surgical History:  Procedure Laterality Date   CYSTECTOMY     CYSTOSCOPY WITH RETROGRADE PYELOGRAM, URETEROSCOPY AND STENT PLACEMENT Left 02/22/2014   Procedure: CYSTOSCOPY WITH RETROGRADE PYELOGRAM, URETEROSCOPY, STONE BASKET EXTRACTION ;  Surgeon: Kathi Ludwig, MD;  Location: [facility];  Service: Urology;  Laterality: Left;   tailbone cyst     TUBAL LIGATION      OB History     Gravida  6   Para  3   Term      Preterm      AB  3   Living  3      SAB  3   IAB      Ectopic      Multiple      Live Births               Home Medications    Prior to Admission medications   Medication Sig Start Date  End Date Taking? Authorizing Provider  amoxicillin-clavulanate (AUGMENTIN) 875-125 MG tablet Take 1 tablet by mouth every 12 (twelve) hours. 01/16/22  Yes Breyanna Valera, Britta Mccreedy, MD  predniSONE (DELTASONE) 10 MG tablet Take 2 tablets (20 mg total) by mouth daily for 5 days. 01/16/22 01/21/22 Yes Ashtin Melichar, Britta Mccreedy, MD  promethazine (PHENERGAN) 25 MG tablet Take 1 tablet (25 mg total) by mouth every 8 (eight) hours as needed for nausea. 01/16/22  Yes Orchid Glassberg, Britta Mccreedy, MD  acyclovir (ZOVIRAX) 400 MG tablet Take 1 tablet (400 mg total) by mouth 2 (two) times daily. 08/27/21   Fayette Pho, MD  albuterol (PROVENTIL) (2.5 MG/3ML) 0.083% nebulizer solution Take 3 mLs (2.5 mg total) by nebulization every 6 (six) hours as needed for wheezing or shortness of breath.  07/20/21   Wallis Bamberg, PA-C  albuterol (VENTOLIN HFA) 108 (90 Base) MCG/ACT inhaler Inhale 2 puffs into the lungs every 4 (four) hours as needed for wheezing or shortness of breath. 07/15/21   Rhys Martini, PA-C  benzonatate (TESSALON) 100 MG capsule Take 1-2 capsules (100-200 mg total) by mouth 3 (three) times daily as needed for cough. 01/07/21   Wallis Bamberg, PA-C  cetirizine (ZYRTEC) 10 MG tablet TAKE 1 TABLET(10 MG) BY MOUTH DAILY 08/27/21   Fayette Pho, MD  fluticasone [facility]) 50 MCG/ACT nasal spray Place 2 sprays into both nostrils daily. 03/20/21   Shirlean Mylar, MD  hydrocortisone (ANUSOL-HC) 2.5 % rectal cream Place 1 Application rectally 2 (two) times daily. 08/27/21   Fayette Pho, MD  hydrocortisone (ANUSOL-HC) 25 MG suppository Place 1 suppository (25 mg total) rectally 2 (two) times daily. 08/27/21   Fayette Pho, MD  ibuprofen (ADVIL) 600 MG tablet Take 1 tablet (600 mg total) by mouth every 6 (six) hours as needed. 08/13/19   McDonald, Mia A, PA-C  Lysine 1000 MG TABS Take 1 tablet (1,000 mg total) by mouth daily. 12/05/15   Uvaldo Rising, MD  montelukast (SINGULAIR) 10 MG tablet Take 1 tablet (10 mg total) by mouth at bedtime. 06/13/19   Shirley, [place], DO  nicotine (NICODERM CQ - DOSED IN MG/24 HOURS) 14 mg/24hr patch Place 1 patch (14 mg total) onto the skin in the morning. 03/20/21   Westley Chandler, MD  NIFEdipine (PROCARDIA-XL/NIFEDICAL-XL) 30 MG 24 hr tablet Take 1 tablet (30 mg total) by mouth at bedtime. 08/27/21   Fayette Pho, MD  promethazine-dextromethorphan (PROMETHAZINE-DM) 6.25-15 MG/5ML syrup Take 5 mLs by mouth 3 (three) times daily as needed for cough. 07/20/21   Wallis Bamberg, PA-C    Family History Family History  Problem Relation Age of Onset   Arrhythmia Father        Pacemakers placed didn't take passed away 3 moths later   Arrhythmia Sister        Pacemaker didn't take to body passed away 3 months ;later   Hypertension Maternal Aunt    Heart  failure Maternal Grandmother    Hypertension Maternal Grandfather    Diabetes Other    CAD Other    Heart Problems Mother    Heart failure Mother     Social History Social History   Tobacco Use   Smoking status: Every Day    Packs/day: 0.50    Types: Cigarettes    Start date: 11/19/2012   Smokeless tobacco: Never   Tobacco comments:    Reports attempting cessation with patches.   Vaping Use   Vaping Use: Never used  Substance Use Topics   Alcohol use: No  Drug use: Yes    Types: Marijuana     Allergies   Doxycycline and Lorcet [hydrocodone-acetaminophen]   Review of Systems Review of Systems As per HPI  Physical Exam Triage Vital Signs ED Triage Vitals  Enc Vitals Group     BP 01/16/22 1440 (!) 164/104     Pulse Rate 01/16/22 1440 76     Resp 01/16/22 1440 18     Temp 01/16/22 1440 98.3 F (36.8 C)     Temp src --      SpO2 01/16/22 1440 96 %     Weight --      Height --      Head Circumference --      Peak Flow --      Pain Score 01/16/22 1437 8     Pain Loc --      Pain Edu? --      Excl. in GC? --    No data found.  Updated Vital Signs BP (!) 164/104   Pulse 76   Temp 98.3 F (36.8 C)   Resp 18   LMP  (LMP Unknown)   SpO2 96%   Visual Acuity Right Eye Distance:   Left Eye Distance:   Bilateral Distance:    Right Eye Near:   Left Eye Near:    Bilateral Near:     Physical Exam Vitals and nursing note reviewed.  Constitutional:      General: She is not in acute distress.    Appearance: Normal appearance. She is not ill-appearing.  HENT:     Right Ear: Tympanic membrane normal.     Left Ear: Tympanic membrane normal.     Nose: Congestion present. No rhinorrhea.     Mouth/Throat:     Mouth: Mucous membranes are moist.     Pharynx: No posterior oropharyngeal erythema.  Cardiovascular:     Rate and Rhythm: Normal rate and regular rhythm.  Pulmonary:     Effort: Pulmonary effort is normal.     Breath sounds: Normal breath sounds.   Abdominal:     General: Bowel sounds are normal.     Palpations: Abdomen is soft.  Musculoskeletal:     Cervical back: Neck supple. No rigidity.  Lymphadenopathy:     Cervical: No cervical adenopathy.  Neurological:     Mental Status: She is alert.      UC Treatments / Results  Labs (all labs ordered are listed, but only abnormal results are displayed) Labs Reviewed - No data to display  EKG   Radiology No results found.  Procedures Procedures (including critical care time)  Medications Ordered in UC Medications - No data to display  Initial Impression / Assessment and Plan / UC Course  I have reviewed the triage vital signs and the nursing notes.  Pertinent labs & imaging results that were available during my care of the patient were reviewed by me and considered in my medical decision making (see chart for details).     1.  Acute bacterial sinusitis with symptoms greater than 10 days: Augmentin 875-125 mg twice daily for 14 days Continue Flonase, Mucinex, humidifier and saline nasal rinses Prednisone 20 mg orally daily for 5 days No signs of asthma exacerbation Continue albuterol use as indicated Return precautions given. Final Clinical Impressions(s) / UC Diagnoses   Final diagnoses:  Acute sinusitis with symptoms > 10 days     Discharge Instructions      Please continue using the humidifier and VapoRub use  Take medications as prescribed Continue nasal spray and saline nasal rinses If you have worsening symptoms please return to urgent care to be reevaluated.    ED Prescriptions     Medication Sig Dispense Auth. Provider   amoxicillin-clavulanate (AUGMENTIN) 875-125 MG tablet Take 1 tablet by mouth every 12 (twelve) hours. 14 tablet Noe Pittsley, Britta Mccreedy, MD   predniSONE (DELTASONE) 10 MG tablet Take 2 tablets (20 mg total) by mouth daily for 5 days. 10 tablet Salli Bodin, Britta Mccreedy, MD   promethazine (PHENERGAN) 25 MG tablet Take 1 tablet (25 mg total)  by mouth every 8 (eight) hours as needed for nausea. 12 tablet Deuntae Kocsis, Britta Mccreedy, MD      PDMP not reviewed this encounter.   Merrilee Jansky, MD 01/16/22 [phone]

## 2022-01-16 NOTE — ED Triage Notes (Signed)
Pt reports a possible sinus infection with heavy mucous production. Pt thinks sher has a sinus infection. Pt woke up this moring with swelling to her face. Pt has been using OTC with out help.

## 2022-02-21 ENCOUNTER — Ambulatory Visit (INDEPENDENT_AMBULATORY_CARE_PROVIDER_SITE_OTHER): Payer: Medicaid Other

## 2022-02-21 ENCOUNTER — Encounter (HOSPITAL_COMMUNITY): Payer: Self-pay | Admitting: Emergency Medicine

## 2022-02-21 ENCOUNTER — Ambulatory Visit (HOSPITAL_COMMUNITY)
Admission: EM | Admit: 2022-02-21 | Discharge: 2022-02-21 | Disposition: A | Discharge status: Home / Self Care | Payer: Medicaid Other | Attending: Emergency Medicine | Admitting: Emergency Medicine

## 2022-02-21 DIAGNOSIS — J4541 Moderate persistent asthma with (acute) exacerbation: Secondary | ICD-10-CM

## 2022-02-21 DIAGNOSIS — R059 Cough, unspecified: Secondary | ICD-10-CM

## 2022-02-21 DIAGNOSIS — R509 Fever, unspecified: Secondary | ICD-10-CM

## 2022-02-21 LAB — POC INFLUENZA A AND B ANTIGEN (URGENT CARE ONLY)
INFLUENZA A ANTIGEN, POC: NEGATIVE
INFLUENZA B ANTIGEN, POC: NEGATIVE

## 2022-02-21 MED ORDER — ALBUTEROL SULFATE HFA 108 (90 BASE) MCG/ACT IN AERS: 2.0000 | INHALATION_SPRAY | RESPIRATORY_TRACT | 5 refills | Status: DC | PRN

## 2022-02-21 MED ORDER — PREDNISONE 10 MG PO TABS: 20.0000 mg | ORAL_TABLET | Freq: Every day | ORAL | 0 refills | Status: DC

## 2022-02-21 MED ORDER — BENZONATATE 100 MG PO CAPS: 100.0000 mg | ORAL_CAPSULE | Freq: Three times a day (TID) | ORAL | 0 refills | Status: DC | PRN

## 2022-02-21 MED ORDER — AZITHROMYCIN 250 MG PO TABS: 250.0000 mg | ORAL_TABLET | Freq: Every day | ORAL | 0 refills | Status: DC

## 2022-02-21 NOTE — Discharge Instructions (Addendum)
Chest x-ray was negative for acute pulmonary disease Influenza A/B test is negative Proair inhaler prescribed.  Use as directed Azithromycin was prescribed/take as directed Tessalon Perles was prescribed for cough Take steroid as prescribed and to completion Follow up with PCP next week Return here or go to ER if you have any new or worsening symptoms such as shortness of breath, difficulty breathing, accessory muscle use, rib retraction, or if symptoms do not improve with medication

## 2022-02-21 NOTE — ED Provider Notes (Addendum)
Gail Bass   HE:[id number] 02/21/22 Arrival Time: 36   Chief Complaint  Patient presents with   Cough    SUBJECTIVE: History from: patient and family.  Gail Bass is a 46 y.o. female who presents with complaint of persistent productive cough with yellow sputum, wheezing, and fever  and headache. Triggers: exposure to flu. Onset gradual, approximately 2 week ago. Describes wheezing as moderate when present. Fever: yes. Overall normal PO intake without n/v. Sick contacts: yes. Typically her asthma is well controlled. Denies fever, chills, nausea, vomiting, SOB, chest pain, abdominal pain, changes in bowel or bladder function.   Inhaler use: None. OTC treatment: DayQuil, Mucinex, NyQuil, vitamin D, Flonase and Zyrtec   ROS: As per HPI.  All other pertinent ROS negative.      Past Medical History:  Diagnosis Date   Abscess of buttock 08/13/2019   Asthma    Bacterial vaginosis    Boil 06/14/2019   BV (bacterial vaginosis) 09/13/2008        Hypertension 05/11/2013   Kidney stone    OVARIAN CYST, LEFT 10/29/2008   Qualifier: Diagnosis of  By: Drue Flirt  MD, Taineisha     Pain in the chest 06/06/2015   Painful lumpy right breast 03/21/2013   Recurrent cold sores 12/05/2015   Recurrent sinusitis 06/06/2015   Sinus congestion 04/25/2018   TUBAL PREGNANCY 09/13/2008   Qualifier: History of  By: Drue Flirt  MD, Taineisha     Vaginal discharge 01/10/2014   Vaginal discharge 01/10/2014   Past Surgical History:  Procedure Laterality Date   CYSTECTOMY     CYSTOSCOPY WITH RETROGRADE PYELOGRAM, URETEROSCOPY AND STENT PLACEMENT Left 02/22/2014   Procedure: CYSTOSCOPY WITH RETROGRADE PYELOGRAM, URETEROSCOPY, STONE BASKET EXTRACTION ;  Surgeon: Ailene Rud, MD;  Location: [facility];  Service: Urology;  Laterality: Left;   tailbone cyst     TUBAL LIGATION     Allergies  Allergen Reactions   Doxycycline     REACTION: Nausea   Lorcet [Hydrocodone-Acetaminophen] Nausea And  Vomiting and Rash   No current facility-administered medications on file prior to encounter.   Current Outpatient Medications on File Prior to Encounter  Medication Sig Dispense Refill   acyclovir (ZOVIRAX) 400 MG tablet Take 1 tablet (400 mg total) by mouth 2 (two) times daily. 60 tablet 2   amoxicillin-clavulanate (AUGMENTIN) 875-125 MG tablet Take 1 tablet by mouth every 12 (twelve) hours. 14 tablet 0   cetirizine (ZYRTEC) 10 MG tablet TAKE 1 TABLET(10 MG) BY MOUTH DAILY 90 tablet 1   fluticasone (FLONASE) 50 MCG/ACT nasal spray Place 2 sprays into both nostrils daily. 16 g 12   hydrocortisone (ANUSOL-HC) 2.5 % rectal cream Place 1 Application rectally 2 (two) times daily. 30 g 1   hydrocortisone (ANUSOL-HC) 25 MG suppository Place 1 suppository (25 mg total) rectally 2 (two) times daily. 12 suppository 0   ibuprofen (ADVIL) 600 MG tablet Take 1 tablet (600 mg total) by mouth every 6 (six) hours as needed. 30 tablet 0   Lysine 1000 MG TABS Take 1 tablet (1,000 mg total) by mouth daily.  0   montelukast (SINGULAIR) 10 MG tablet Take 1 tablet (10 mg total) by mouth at bedtime. 30 tablet 0   nicotine (NICODERM CQ - DOSED IN MG/24 HOURS) 14 mg/24hr patch Place 1 patch (14 mg total) onto the skin in the morning. 14 patch 3   NIFEdipine (PROCARDIA-XL/NIFEDICAL-XL) 30 MG 24 hr tablet Take 1 tablet (30 mg total) by mouth at bedtime.  30 tablet 1   promethazine-dextromethorphan (PROMETHAZINE-DM) 6.25-15 MG/5ML syrup Take 5 mLs by mouth 4 (four) times daily as needed for cough. 118 mL 0   Social History   Socioeconomic History   Marital status: Single    Spouse name: Not on file   Number of children: Not on file   Years of education: Not on file   Highest education level: Not on file  Occupational History   Not on file  Tobacco Use   Smoking status: Every Day    Packs/day: 0.50    Types: Cigarettes    Start date: 11/19/2012   Smokeless tobacco: Never   Tobacco comments:    Reports  attempting cessation with patches.   Vaping Use   Vaping Use: Never used  Substance and Sexual Activity   Alcohol use: No   Drug use: Yes    Types: Marijuana   Sexual activity: Yes    Partners: Male    Birth control/protection: Condom  Other Topics Concern   Not on file  Social History Narrative   Has 3 children and one adopted daughter.   Works.    [employer].   Mother lives in [place]. They are estranged because her mother practices withcraft.    Social Determinants of Health   Financial Resource Strain: Not on file  Food Insecurity: Not on file  Transportation Needs: Not on file  Physical Activity: Not on file  Stress: Not on file  Social Connections: Not on file  Intimate Partner Violence: Not on file   Family History  Problem Relation Age of Onset   Arrhythmia Father        Pacemakers placed didn't take passed away 3 moths later   Arrhythmia Sister        Pacemaker didn't take to body passed away 3 months ;later   Hypertension Maternal Aunt    Heart failure Maternal Grandmother    Hypertension Maternal Grandfather    Diabetes Other    CAD Other    Heart Problems Mother    Heart failure Mother     OBJECTIVE:  Vitals:   02/21/22 1031  BP: (!) 154/84  Pulse: 74  Resp: 17  Temp: 98.7 F (37.1 C)  TempSrc: Oral  SpO2: 99%     General appearance: alert; appears fatigued HEENT: nasal congestion; clear runny nose; throat irritation secondary to post-nasal drainage Neck: supple without LAD Lungs: unlabored respirations, moderate bilateral wheezing; cough: moderate; no significant respiratory distress Skin: warm and dry Psychological: alert and cooperative; normal mood and affect   RADIOLOGY  DG Chest 2 View  Result Date: 02/21/2022 CLINICAL DATA:  Cough and fever and congestion. EXAM: CHEST - 2 VIEW COMPARISON:  05/20/2021. FINDINGS: Normal heart, mediastinum and hila. Clear lungs.  No pleural effusion or pneumothorax. Skeletal structures are unremarkable.  IMPRESSION: No active cardiopulmonary disease. Electronically Signed   By: Amie Portland M.D.   On: 02/21/2022 11:22     Chest X-ray is negative for acute cardiopulmonary disease.  I have reviewed the x-ray myself and the radiologist interpretation.  I am in agreement with the radiologist interpretation.   ASSESSMENT & PLAN:  1. Moderate persistent asthma with exacerbation     Nebulizer treatment needed: no.   Meds ordered this encounter  Medications   azithromycin (ZITHROMAX) 250 MG tablet    Sig: Take 1 tablet (250 mg total) by mouth daily. Take first 2 tablets together, then 1 every day until finished.    Dispense:  6 tablet  Refill:  0   albuterol (VENTOLIN HFA) 108 (90 Base) MCG/ACT inhaler    Sig: Inhale 2 puffs into the lungs every 4 (four) hours as needed for wheezing or shortness of breath.    Dispense:  18 g    Refill:  5   predniSONE (DELTASONE) 10 MG tablet    Sig: Take 2 tablets (20 mg total) by mouth daily.    Dispense:  15 tablet    Refill:  0   benzonatate (TESSALON) 100 MG capsule    Sig: Take 1-2 capsules (100-200 mg total) by mouth 3 (three) times daily as needed for cough.    Dispense:  30 capsule    Refill:  0   Discharge instructions  Chest x-ray was negative for acute pulmonary disease Influenza A/B test is negative Proair inhaler prescribed.  Use as directed Azithromycin was prescribed/take as directed Tessalon Perles was prescribed for cough Take steroid as prescribed and to completion Follow up with PCP next week Return here or go to ER if you have any new or worsening symptoms such as shortness of breath, difficulty breathing, accessory muscle use, rib retraction, or if symptoms do not improve with medication    Reviewed expectations re: course of current medical issues. Questions answered. Outlined signs and symptoms indicating need for more acute intervention. Patient verbalized understanding. After Visit Summary given.            Emerson Monte, FNP 02/21/22 1145    Emerson Monte, FNP 02/21/22 1148

## 2022-02-21 NOTE — ED Triage Notes (Signed)
Since beginning of December was exposed to flu. Having cough, fever, congestion, aches.  Still having cough and fatigue. Cough makes back hurt. Reports asthma and wheezing at night. Took Dayquil, Mucinex, Nyquil, Vit C, elderberry, Flonase, and zyrtec.

## 2022-04-13 ENCOUNTER — Ambulatory Visit (HOSPITAL_COMMUNITY)
Admission: EM | Admit: 2022-04-13 | Discharge: 2022-04-13 | Disposition: A | Discharge status: Home / Self Care | Payer: Medicaid Other

## 2022-04-14 ENCOUNTER — Ambulatory Visit (HOSPITAL_COMMUNITY): Payer: Medicaid Other

## 2022-04-14 ENCOUNTER — Encounter (HOSPITAL_COMMUNITY): Payer: Self-pay

## 2022-04-14 ENCOUNTER — Ambulatory Visit (HOSPITAL_COMMUNITY)
Admission: EM | Admit: 2022-04-14 | Discharge: 2022-04-14 | Disposition: A | Discharge status: Home / Self Care | Payer: Medicaid Other | Attending: Internal Medicine | Admitting: Internal Medicine

## 2022-04-14 VITALS — BP 162/123 | HR 71 | Temp 98.4°F | Resp 17

## 2022-04-14 DIAGNOSIS — B9689 Other specified bacterial agents as the cause of diseases classified elsewhere: Secondary | ICD-10-CM

## 2022-04-14 DIAGNOSIS — J4541 Moderate persistent asthma with (acute) exacerbation: Secondary | ICD-10-CM | POA: Diagnosis not present

## 2022-04-14 DIAGNOSIS — J329 Chronic sinusitis, unspecified: Secondary | ICD-10-CM | POA: Diagnosis not present

## 2022-04-14 DIAGNOSIS — I10 Essential (primary) hypertension: Secondary | ICD-10-CM

## 2022-04-14 MED ORDER — BENZONATATE 100 MG PO CAPS: 100.0000 mg | ORAL_CAPSULE | Freq: Three times a day (TID) | ORAL | 0 refills | Status: DC | PRN

## 2022-04-14 MED ORDER — ALBUTEROL SULFATE HFA 108 (90 BASE) MCG/ACT IN AERS: 2.0000 | INHALATION_SPRAY | RESPIRATORY_TRACT | 0 refills | Status: DC | PRN

## 2022-04-14 MED ORDER — IPRATROPIUM-ALBUTEROL 0.5-2.5 (3) MG/3ML IN SOLN
3.0000 mL | Freq: Once | RESPIRATORY_TRACT | Status: AC
  Administered 2022-04-14: 3 mL via RESPIRATORY_TRACT

## 2022-04-14 MED ORDER — PROMETHAZINE-DM 6.25-15 MG/5ML PO SYRP: 5.0000 mL | ORAL_SOLUTION | Freq: Four times a day (QID) | ORAL | 0 refills | Status: DC | PRN

## 2022-04-14 MED ORDER — DEXAMETHASONE SODIUM PHOSPHATE 10 MG/ML IJ SOLN
INTRAMUSCULAR | Status: AC
  Filled 2022-04-14: qty 1

## 2022-04-14 MED ORDER — ACETAMINOPHEN 325 MG PO TABS
975.0000 mg | ORAL_TABLET | Freq: Once | ORAL | Status: AC
  Administered 2022-04-14: 975 mg via ORAL

## 2022-04-14 MED ORDER — ACETAMINOPHEN 325 MG PO TABS
ORAL_TABLET | ORAL | Status: AC
  Filled 2022-04-14: qty 3

## 2022-04-14 MED ORDER — PREDNISONE 20 MG PO TABS: 40.0000 mg | ORAL_TABLET | Freq: Every day | ORAL | 0 refills | Status: AC

## 2022-04-14 MED ORDER — IPRATROPIUM-ALBUTEROL 0.5-2.5 (3) MG/3ML IN SOLN
RESPIRATORY_TRACT | Status: AC
  Filled 2022-04-14: qty 3

## 2022-04-14 MED ORDER — AZITHROMYCIN 250 MG PO TABS: 250.0000 mg | ORAL_TABLET | Freq: Every day | ORAL | 0 refills | Status: DC

## 2022-04-14 MED ORDER — DEXAMETHASONE SODIUM PHOSPHATE 10 MG/ML IJ SOLN
10.0000 mg | Freq: Once | INTRAMUSCULAR | Status: AC
  Administered 2022-04-14: 10 mg via INTRAMUSCULAR

## 2022-04-14 NOTE — ED Triage Notes (Signed)
Pt reports since last Tuesday having cough, lots of mucous, runny nose, headache. Took tylenol for headache last night.

## 2022-04-14 NOTE — Discharge Instructions (Addendum)
-   Take antibiotic sent to pharmacy as directed to treat sinus infection. - Take steroid sent to pharmacy as directed starting tomorrow. Do not take any other NSAID containing medications such as ibuprofen or naproxen/Aleve while taking prednisone. - You may use albuterol inhaler 1 to 2 puffs every 4-6 hours as needed for cough, shortness of breath, and wheezing. - Tessalon perles every 8 hours as needed for cough. - Take Promethazine DM cough medication to help with your cough at nighttime so that you are able to sleep. Do not drive, drink alcohol, or go to work while taking this medication since it can make you sleepy. Only take this at nighttime.  - Purchase Mucinex over the counter and take this every 12 hours as needed for nasal congestion and cough.  If you develop any new or worsening symptoms or do not improve in the next 2 to 3 days, please return.  If your symptoms are severe, please go to the emergency room.  Follow-up with your primary care provider for further evaluation and management of your symptoms as well as ongoing wellness visits.  I hope you feel better!  

## 2022-04-14 NOTE — ED Provider Notes (Signed)
[facility]    CSN: [id number] Arrival date & time: 04/14/22  1514      History   Chief Complaint Chief Complaint  Patient presents with  . Headache  . Cough    HPI Gail Bass is a 47 y.o. female.   Patient presents to urgent care for evaluation of headache, nasal congestion, fever, chills, sore throat, cough, shortness of breath, and wheezing that started 1 week ago. Fever 100.0 at highest at home, responded well to tylenol. Her daughter is sick with similar symptoms and tested negative for COVID-19 via PCR. Patient has history of asthma and states she has heard wheezing. She has been using inhaler without relief. Most recent asthma exacerbation was 1 month ago. She is a current everyday cigarette smoker, denies other drug use. Headache is to the frontal aspect of the forehead and "under the eyes" and currently an 8 on a scale of 0-10. Tylenol helped headache a little bit last night. Denies nausea, vomiting, diarrhea, abdominal pain, chest pain, heart palpitations, extremity weakness, vision changes, dizziness, and ear pain/tinnitus. Currently short of breath at rest due to wheezing. Denies recent antibiotic or steroid use. Using Nyquil to sleep with some relief.   Of note, blood pressure is noticeably elevated in clinic. Patient states she has been to her PCP to discuss elevated BP and is currently inbetween BP medications. She plans to start taking new medication tomorrow to reduce BP. Continues to deny chest pain, dizziness, extremity weakness, vision changes, and numbness/tingling to the extremities.    Headache Associated symptoms: cough   Cough Associated symptoms: headaches     Past Medical History:  Diagnosis Date  . Abscess of buttock 08/13/2019  . Asthma   . Bacterial vaginosis   . Boil 06/14/2019  . BV (bacterial vaginosis) 09/13/2008       . Hypertension 05/11/2013  . Kidney stone   . OVARIAN CYST, LEFT 10/29/2008   Qualifier: Diagnosis of  By:  Lanier Prude  MD, Cathrine Muster    . Pain in the chest 06/06/2015  . Painful lumpy right breast 03/21/2013  . Recurrent cold sores 12/05/2015  . Recurrent sinusitis 06/06/2015  . Sinus congestion 04/25/2018  . TUBAL PREGNANCY 09/13/2008   Qualifier: History of  By: Lanier Prude  MD, Cathrine Muster    . Vaginal discharge 01/10/2014  . Vaginal discharge 01/10/2014    Patient Active Problem List   Diagnosis Date Noted  . Milia of right eyelid 08/27/2021  . Viral illness 02/27/2021  . Back pain 02/13/2021  . Overactive bladder 07/03/2020  . Levator spasm 07/03/2020  . Dyspareunia, female 07/03/2020  . Mixed stress and urge urinary incontinence 04/11/2020  . History of COVID-19 03/13/2020  . Seasonal allergies 06/14/2019  . PTSD (post-traumatic stress disorder) 12/05/2015  . External hemorrhoids 08/07/2015  . Vaginal discharge 01/10/2014  . Hypertension 05/11/2013  . Tobacco use disorder 03/21/2013  . ASTHMA, INTERMITTENT 12/23/2009  . MIGRAINE HEADACHE 09/13/2008  . MENORRHAGIA 09/13/2008  . Depression 06/21/2008    Past Surgical History:  Procedure Laterality Date  . CYSTECTOMY    . CYSTOSCOPY WITH RETROGRADE PYELOGRAM, URETEROSCOPY AND STENT PLACEMENT Left 02/22/2014   Procedure: CYSTOSCOPY WITH RETROGRADE PYELOGRAM, URETEROSCOPY, STONE BASKET EXTRACTION ;  Surgeon: Kathi Ludwig, MD;  Location: [facility];  Service: Urology;  Laterality: Left;  . tailbone cyst    . TUBAL LIGATION      OB History     Gravida  6   Para  3   Term  Preterm      AB  3   Living  3      SAB  3   IAB      Ectopic      Multiple      Live Births               Home Medications    Prior to Admission medications   Medication Sig Start Date End Date Taking? Authorizing Provider  predniSONE (DELTASONE) 20 MG tablet Take 2 tablets (40 mg total) by mouth daily for 5 days. 04/14/22 04/19/22 Yes Jeanene Mena, Donavan Burnet, FNP  acyclovir (ZOVIRAX) 400 MG tablet Take 1 tablet (400 mg total) by mouth 2  (two) times daily. 08/27/21   Fayette Pho, MD  albuterol (VENTOLIN HFA) 108 (90 Base) MCG/ACT inhaler Inhale 2 puffs into the lungs every 4 (four) hours as needed for wheezing or shortness of breath. 04/14/22   Carlisle Beers, FNP  amoxicillin-clavulanate (AUGMENTIN) 875-125 MG tablet Take 1 tablet by mouth every 12 (twelve) hours. 01/16/22   Lamptey, Britta Mccreedy, MD  azithromycin (ZITHROMAX) 250 MG tablet Take 1 tablet (250 mg total) by mouth daily. Take first 2 tablets together, then 1 every day until finished. 04/14/22   Carlisle Beers, FNP  benzonatate (TESSALON) 100 MG capsule Take 1-2 capsules (100-200 mg total) by mouth 3 (three) times daily as needed for cough. 04/14/22   Carlisle Beers, FNP  cetirizine (ZYRTEC) 10 MG tablet TAKE 1 TABLET(10 MG) BY MOUTH DAILY 08/27/21   Fayette Pho, MD  fluticasone [facility]) 50 MCG/ACT nasal spray Place 2 sprays into both nostrils daily. 03/20/21   Shirlean Mylar, MD  hydrocortisone (ANUSOL-HC) 2.5 % rectal cream Place 1 Application rectally 2 (two) times daily. 08/27/21   Fayette Pho, MD  hydrocortisone (ANUSOL-HC) 25 MG suppository Place 1 suppository (25 mg total) rectally 2 (two) times daily. 08/27/21   Fayette Pho, MD  ibuprofen (ADVIL) 600 MG tablet Take 1 tablet (600 mg total) by mouth every 6 (six) hours as needed. 08/13/19   McDonald, Mia A, PA-C  Lysine 1000 MG TABS Take 1 tablet (1,000 mg total) by mouth daily. 12/05/15   Uvaldo Rising, MD  montelukast (SINGULAIR) 10 MG tablet Take 1 tablet (10 mg total) by mouth at bedtime. 06/13/19   Shirley, [place], DO  nicotine (NICODERM CQ - DOSED IN MG/24 HOURS) 14 mg/24hr patch Place 1 patch (14 mg total) onto the skin in the morning. 03/20/21   Westley Chandler, MD  NIFEdipine (PROCARDIA-XL/NIFEDICAL-XL) 30 MG 24 hr tablet Take 1 tablet (30 mg total) by mouth at bedtime. 08/27/21   Fayette Pho, MD  promethazine-dextromethorphan (PROMETHAZINE-DM) 6.25-15 MG/5ML syrup Take 5 mLs by  mouth 4 (four) times daily as needed for cough. 04/14/22   Carlisle Beers, FNP    Family History Family History  Problem Relation Age of Onset  . Arrhythmia Father        Pacemakers placed didn't take passed away 3 moths later  . Arrhythmia Sister        Pacemaker didn't take to body passed away 3 months ;later  . Hypertension Maternal Aunt   . Heart failure Maternal Grandmother   . Hypertension Maternal Grandfather   . Diabetes Other   . CAD Other   . Heart Problems Mother   . Heart failure Mother     Social History Social History   Tobacco Use  . Smoking status: Every Day    Packs/day: 0.50  Types: Cigarettes    Start date: 11/19/2012  . Smokeless tobacco: Never  . Tobacco comments:    Reports attempting cessation with patches.   Vaping Use  . Vaping Use: Never used  Substance Use Topics  . Alcohol use: No  . Drug use: Yes    Types: Marijuana     Allergies   Doxycycline and Lorcet [hydrocodone-acetaminophen]   Review of Systems Review of Systems  Respiratory:  Positive for cough.   Neurological:  Positive for headaches.  Per HPI   Physical Exam Triage Vital Signs ED Triage Vitals  Enc Vitals Group     BP 04/14/22 1610 (!) 172/111     Pulse Rate 04/14/22 1610 83     Resp 04/14/22 1610 17     Temp 04/14/22 1610 98.4 F (36.9 C)     Temp Source 04/14/22 1610 Oral     SpO2 04/14/22 1610 97 %     Weight --      Height --      Head Circumference --      Peak Flow --      Pain Score 04/14/22 1611 8     Pain Loc --      Pain Edu? --      Excl. in GC? --    No data found.  Updated Vital Signs BP (!) 162/123 (BP Location: Right Arm)   Pulse 71   Temp 98.4 F (36.9 C) (Oral)   Resp 17   SpO2 97%   Visual Acuity Right Eye Distance:   Left Eye Distance:   Bilateral Distance:    Right Eye Near:   Left Eye Near:    Bilateral Near:     Physical Exam Vitals and nursing note reviewed.  Constitutional:      Appearance: She is  ill-appearing. She is not toxic-appearing.  HENT:     Head: Normocephalic and atraumatic.     Right Ear: Hearing, tympanic membrane, ear canal and external ear normal.     Left Ear: Hearing, tympanic membrane, ear canal and external ear normal.     Nose: Congestion present.     Right Sinus: Maxillary sinus tenderness and frontal sinus tenderness present.     Left Sinus: Maxillary sinus tenderness and frontal sinus tenderness present.     Mouth/Throat:     Lips: Pink.     Mouth: Mucous membranes are moist. No injury.     Tongue: No lesions. Tongue does not deviate from midline.     Palate: No mass and lesions.     Pharynx: Oropharynx is clear. Uvula midline. No pharyngeal swelling, oropharyngeal exudate, posterior oropharyngeal erythema or uvula swelling.     Tonsils: No tonsillar exudate or tonsillar abscesses.  Eyes:     General: Lids are normal. Vision grossly intact. Gaze aligned appropriately.     Extraocular Movements: Extraocular movements intact.     Conjunctiva/sclera: Conjunctivae normal.     Comments: EOMs intact without pain or dizziness elicited.  Cardiovascular:     Rate and Rhythm: Normal rate and regular rhythm.     Heart sounds: Normal heart sounds, S1 normal and S2 normal.  Pulmonary:     Effort: Pulmonary effort is normal. No respiratory distress.     Breath sounds: Normal air entry. Wheezing present. No rhonchi or rales.     Comments: Diffuse inspiratory and expiratory wheezing heard to all lung fields. No respiratory distress, retractions, or tripoding observed. Speaks in full sentences without difficulty.  Musculoskeletal:  Cervical back: Neck supple.  Lymphadenopathy:     Cervical: Cervical adenopathy present.  Skin:    General: Skin is warm and dry.     Capillary Refill: Capillary refill takes less than 2 seconds.     Findings: No rash.  Neurological:     General: No focal deficit present.     Mental Status: She is alert and oriented to person, place,  and time. Mental status is at baseline.     Cranial Nerves: No dysarthria or facial asymmetry.  Psychiatric:        Mood and Affect: Mood normal.        Speech: Speech normal.        Behavior: Behavior normal.        Thought Content: Thought content normal.        Judgment: Judgment normal.     UC Treatments / Results  Labs (all labs ordered are listed, but only abnormal results are displayed) Labs Reviewed - No data to display  EKG   Radiology No results found.  Procedures Procedures (including critical care time)  Medications Ordered in UC Medications  dexamethasone (DECADRON) injection 10 mg (10 mg Intramuscular Given 04/14/22 1657)  ipratropium-albuterol (DUONEB) 0.5-2.5 (3) MG/3ML nebulizer solution 3 mL (3 mLs Nebulization Given 04/14/22 1657)  acetaminophen (TYLENOL) tablet 975 mg (975 mg Oral Given 04/14/22 1703)    Initial Impression / Assessment and Plan / UC Course  I have reviewed the triage vital signs and the nursing notes.  Pertinent labs & imaging results that were available during my care of the patient were reviewed by me and considered in my medical decision making (see chart for details).   1. Moderate persistent asthma with acute exacerbation, bacterial sinusitis DuoNeb and dexamethasone 10mg  IM provided for cough, shortness of breath, and wheeze. Lung sounds and subjective shortness of breath improved significantly after DuoNeb. Prednisone burst to be started tomorrow for further relief of inflammation and shortness of breath related to acute asthma exacerbation. No NSAIDs while taking prednisone. Deferred imaging due to significant improvement in lung sounds after DuoNeb and hemodynamically stable vital signs May continue using albuterol inhaler as needed at home. Advised to follow-up with pulmonology and PCP for ongoing evaluation and management of asthma.  Symptoms have been persistent for the last week and continues to have fever/chills with associated  frontal and maxillary discomfort and headache, therefore will provide antibiotic therapy for acute bacterial sinusitis. She states she is not allergic to penicillins, but does not tolerate Augmentin well. Allergic to doxycycline. Will use azithromycin to be taken as directed. Cough medications sent to pharmacy to be taken as directed, drowsiness precautions discussed.   2. Essential hypertension Advised to start taking BP medicine prescribed by PCP. Reduce salt in diet, increase exercise. She is neurovascularly intact to her baseline and without red flag signs/symptoms indicating need for referral to ED. Headache improved significantly after dexamethasone injection in clinic.   Discussed physical exam and available lab work findings in clinic with patient.  Counseled patient regarding appropriate use of medications and potential side effects for all medications recommended or prescribed today. Discussed red flag signs and symptoms of worsening condition,when to call the PCP office, return to urgent care, and when to seek higher level of care in the emergency department. Patient verbalizes understanding and agreement with plan. All questions answered. Patient discharged in stable condition.    Final Clinical Impressions(s) / UC Diagnoses   Final diagnoses:  Moderate persistent asthma with acute  exacerbation  Bacterial sinusitis     Discharge Instructions      - Take antibiotic sent to pharmacy as directed to treat sinus infection. - Take steroid sent to pharmacy as directed starting tomorrow. Do not take any other NSAID containing medications such as ibuprofen or naproxen/Aleve while taking prednisone. - You may use albuterol inhaler 1 to 2 puffs every 4-6 hours as needed for cough, shortness of breath, and wheezing. - Tessalon perles every 8 hours as needed for cough. - Take Promethazine DM cough medication to help with your cough at nighttime so that you are able to sleep. Do not drive,  drink alcohol, or go to work while taking this medication since it can make you sleepy. Only take this at nighttime.  - Purchase Mucinex over the counter and take this every 12 hours as needed for nasal congestion and cough.  If you develop any new or worsening symptoms or do not improve in the next 2 to 3 days, please return.  If your symptoms are severe, please go to the emergency room.  Follow-up with your primary care provider for further evaluation and management of your symptoms as well as ongoing wellness visits.  I hope you feel better!      ED Prescriptions     Medication Sig Dispense Auth. Provider   azithromycin (ZITHROMAX) 250 MG tablet Take 1 tablet (250 mg total) by mouth daily. Take first 2 tablets together, then 1 every day until finished. 6 tablet Reita May M, FNP   benzonatate (TESSALON) 100 MG capsule Take 1-2 capsules (100-200 mg total) by mouth 3 (three) times daily as needed for cough. 30 capsule Carlisle Beers, FNP   promethazine-dextromethorphan (PROMETHAZINE-DM) 6.25-15 MG/5ML syrup Take 5 mLs by mouth 4 (four) times daily as needed for cough. 118 mL Reita May M, FNP   predniSONE (DELTASONE) 20 MG tablet Take 2 tablets (40 mg total) by mouth daily for 5 days. 10 tablet Carlisle Beers, FNP   albuterol (VENTOLIN HFA) 108 (90 Base) MCG/ACT inhaler Inhale 2 puffs into the lungs every 4 (four) hours as needed for wheezing or shortness of breath. 18 g Carlisle Beers, FNP      PDMP not reviewed this encounter.   Carlisle Beers, [place] 04/15/22 2158

## 2022-04-15 ENCOUNTER — Encounter: Payer: Self-pay | Admitting: Family Medicine

## 2022-04-28 ENCOUNTER — Ambulatory Visit (INDEPENDENT_AMBULATORY_CARE_PROVIDER_SITE_OTHER): Payer: Medicaid Other | Admitting: Family Medicine

## 2022-04-28 VITALS — BP 148/96 | HR 90 | Ht 62.0 in | Wt 169.0 lb

## 2022-04-28 DIAGNOSIS — I10 Essential (primary) hypertension: Secondary | ICD-10-CM | POA: Diagnosis not present

## 2022-04-28 DIAGNOSIS — F17200 Nicotine dependence, unspecified, uncomplicated: Secondary | ICD-10-CM | POA: Diagnosis not present

## 2022-04-28 MED ORDER — LOSARTAN POTASSIUM 25 MG PO TABS: 25.0000 mg | ORAL_TABLET | Freq: Every day | ORAL | 0 refills | Status: DC

## 2022-04-28 NOTE — Progress Notes (Unsigned)
    SUBJECTIVE:   CHIEF COMPLAINT / HPI:   Fever, congestion Daughter sick with similar symptoms Got course of prednisone and z-pack BP was elevated-- had been taking a lot of OTC meds at that time  Has tried BP meds in the past, had adverse effects w/amlodipine Started nifedipine on the 13th, takes it at night Still taking promethazine-DM at night sometimes for URI symptoms  Has BP cuff at home. Takes BP  130s-140s/90s-100  Trying to quit smoking    Dr Valentina Lucks-  PERTINENT  PMH / PSH: HTN, asthma, depression  OBJECTIVE:   BP (!) 159/97   Pulse 90   Ht 5' 2"$  (1.575 m)   Wt 169 lb (76.7 kg)   SpO2 98%   BMI 30.91 kg/m   ***  ASSESSMENT/PLAN:   No problem-specific Assessment & Plan notes found for this encounter.     Alcus Dad, MD [place]

## 2022-04-28 NOTE — Patient Instructions (Addendum)
It was great to see you!  We will check some basic lab work today. I will send you a MyChart message with the results.  We will start another blood pressure medication called Losartan, which Dr Jeani Hawking wanted to start previously. Take it once daily in addition to your nifedipine.  Continue checking your BP at home.  Follow up with Dr Jeani Hawking in 2 weeks.  You can also schedule an appointment with Dr. Valentina Lucks on your way out today to help with quitting smoking.  Take care, Dr Rock Nephew

## 2022-04-29 ENCOUNTER — Other Ambulatory Visit: Payer: Self-pay | Admitting: Family Medicine

## 2022-04-29 DIAGNOSIS — T7840XA Allergy, unspecified, initial encounter: Secondary | ICD-10-CM

## 2022-04-29 DIAGNOSIS — B001 Herpesviral vesicular dermatitis: Secondary | ICD-10-CM

## 2022-04-29 LAB — BASIC METABOLIC PANEL
BUN/Creatinine Ratio: 21 (ref 9–23)
BUN: 14 mg/dL (ref 6–24)
CO2: 23 mmol/L (ref 20–29)
Calcium: 9.7 mg/dL (ref 8.7–10.2)
Chloride: 103 mmol/L (ref 96–106)
Creatinine, Ser: 0.66 mg/dL (ref 0.57–1.00)
Glucose: 92 mg/dL (ref 70–99)
Potassium: 4.2 mmol/L (ref 3.5–5.2)
Sodium: 141 mmol/L (ref 134–144)
eGFR: 110 mL/min/{1.73_m2} (ref >59)

## 2022-04-29 NOTE — Assessment & Plan Note (Addendum)
Using nicotine patches intermittently. Motivated to quit (children and grandchildren are main motivators). Advised patient to schedule appt with Dr Valentina Lucks on her way out.

## 2022-04-29 NOTE — Assessment & Plan Note (Signed)
BP not at goal. Continue Nifedipine '30mg'$  daily. Start Losartan '25mg'$  daily. Check BMP today. F/u with PCP in 2 weeks. Continue home BP monitoring in the meantime. Patient agreeable with this plan. We also discussed proper technique when checking BP (feet flat, arm supported, empty bladder, etc) and quitting smoking/avoiding alcohol as these contribute to elevated BP.

## 2022-05-08 ENCOUNTER — Encounter: Payer: Self-pay | Admitting: Family Medicine

## 2022-05-08 ENCOUNTER — Ambulatory Visit (INDEPENDENT_AMBULATORY_CARE_PROVIDER_SITE_OTHER): Payer: Medicaid Other | Admitting: Family Medicine

## 2022-05-08 VITALS — BP 132/88 | HR 87 | Wt 170.0 lb

## 2022-05-08 DIAGNOSIS — I10 Essential (primary) hypertension: Secondary | ICD-10-CM

## 2022-05-08 DIAGNOSIS — R232 Flushing: Secondary | ICD-10-CM | POA: Diagnosis not present

## 2022-05-08 DIAGNOSIS — K625 Hemorrhage of anus and rectum: Secondary | ICD-10-CM

## 2022-05-08 DIAGNOSIS — K649 Unspecified hemorrhoids: Secondary | ICD-10-CM

## 2022-05-08 DIAGNOSIS — K644 Residual hemorrhoidal skin tags: Secondary | ICD-10-CM

## 2022-05-08 MED ORDER — PREPARATION H 0.25-88.44 % RE SUPP: 1.0000 | RECTAL | 0 refills | Status: DC | PRN

## 2022-05-08 MED ORDER — PHENYLEPHRINE-MINERAL OIL-PET 0.25-14-74.9 % RE OINT: 1.0000 | TOPICAL_OINTMENT | Freq: Two times a day (BID) | RECTAL | 1 refills | Status: DC | PRN

## 2022-05-08 NOTE — Patient Instructions (Addendum)
It was wonderful to see you today. Thank you for allowing me to be a part of your care. Below is a short summary of what we discussed at your visit today:  Blood pressure Looks good today! No changes at this time. Keep up the good work! We will check your kidney function about every 6 months.   Blood in stool, hemorrhoids Today we checked your red blood cell count. If the results are normal, I will send you a letter or MyChart message. If the results are abnormal, I will give you a call.    I have referred you to GI for these hemorrhoids that are not responding to normal treatment.  Someone from their office should be calling you in 1 to 2 weeks to schedule an appointment.  If you do not hear from them, let us know. We may need to nudge along the referral.    PAP smear Please book an appointment at the front desk for a PAP smear appointment.    Please bring all of your medications to every appointment!  If you have any questions or concerns, please do not hesitate to contact us via phone or MyChart message.   Ezequiel Essex, MD

## 2022-05-08 NOTE — Progress Notes (Unsigned)
SUBJECTIVE:   CHIEF COMPLAINT / HPI:   HTN follow up - Started nifedipine about one month ago - Saw Dr. Rock Nephew 2/27 (was taking nifedipine x2 weeks at that time). Was recommended to add losartan.  - Today patient reports she has not started losartan. She read that losartan should be used with caution in hot environments and that those taking it could have increased urination and should avoid the sun.  - No orthostatics or adverse s/e with nifedipine only  Home BP 133/89 - this AM 138/87 - yesterday 133/89 - Tues 129/87 - Mon 124/89 - Sun  BP Readings from Last 3 Encounters:  05/08/22 132/88  04/28/22 (!) 148/96  04/14/22 (!) 162/123   Concern for pre-menopausal sxs with profuse sweating - wants to know if we can test to tell if she's going into menopause  Blood in stool - known hemorrhoids - hemorrhoids have thus far been resistant to conservative management - she is open to Gi consult  Concerns about heart skipping beats - wonders if she has abnormal heart rhythm - when checking BP with automatic home cuff, she sees the pulse icon blink regularly then skin beats  - she's wondering if that means she has irregular HR  PERTINENT  PMH / PSH:  Patient Active Problem List   Diagnosis Date Noted   Hot flashes 05/10/2022   Milia of right eyelid 08/27/2021   Viral illness 02/27/2021   Back pain 02/13/2021   Overactive bladder 07/03/2020   Levator spasm 07/03/2020   Dyspareunia, female 07/03/2020   Mixed stress and urge urinary incontinence 04/11/2020   History of COVID-19 03/13/2020   Seasonal allergies 06/14/2019   PTSD (post-traumatic stress disorder) 12/05/2015   External hemorrhoids 08/07/2015   Vaginal discharge 01/10/2014   Hypertension 05/11/2013   Tobacco use disorder 03/21/2013   ASTHMA, INTERMITTENT 12/23/2009   MIGRAINE HEADACHE 09/13/2008   MENORRHAGIA 09/13/2008   Depression 06/21/2008    OBJECTIVE:   BP 132/88   Pulse 87   Wt 170 lb (77.1 kg)    LMP 04/20/2022 (Approximate)   SpO2 98%   BMI 31.09 kg/m    PHQ-9:     05/08/2022    2:08 PM 04/28/2022    4:36 PM 08/27/2021    8:37 AM  Depression screen PHQ 2/9  Decreased Interest 0 0 0  Down, Depressed, Hopeless 0 0 0  PHQ - 2 Score 0 0 0  Altered sleeping '1 1 1  '$ Tired, decreased energy '1 1 1  '$ Change in appetite 0 1 0  Feeling bad or failure about yourself  0 0 0  Trouble concentrating 0 0 0  Moving slowly or fidgety/restless 0 0 0  Suicidal thoughts 0 0 0  PHQ-9 Score '2 3 2  '$ Difficult doing work/chores Not difficult at all Not difficult at all Not difficult at all    Physical Exam General: Awake, alert, oriented Cardiovascular: Regular rate and rhythm, S1 and S2 present, no murmurs auscultated Respiratory: Lung fields clear to auscultation bilaterally  ASSESSMENT/PLAN:   Hypertension BP today at goal with regular nifedipine use only. Patient has not started losartan yet due to concerns over side effects. Will continue nifedipine today without adjustments. Could consider increasing from 30 to 60 mg daily in future if patient amenable.   External hemorrhoids Hemorrhoids resistant to conservative treatment thus far. Patient still having intermittent painful hemorrhoids and blood in stool. Refer to GI. CBC today.   Hot flashes Patient concerned about possible per-menopause and asks  about testing to determine. Discussed that because she is still having regular periods, the hormone blood work will not be very revealing.    Heart beat concerns Patient's heart rate regular. Reassured.   Ezequiel Essex, MD [place]

## 2022-05-09 LAB — CBC
Hematocrit: 42.1 % (ref 34.0–46.6)
Hemoglobin: 14.6 g/dL (ref 11.1–15.9)
MCH: 32.7 pg (ref 26.6–33.0)
MCHC: 34.7 g/dL (ref 31.5–35.7)
MCV: 94 fL (ref 79–97)
Platelets: 274 10*3/uL (ref 150–450)
RBC: 4.47 x10E6/uL (ref 3.77–5.28)
RDW: 12 % (ref 11.7–15.4)
WBC: 9.4 10*3/uL (ref 3.4–10.8)

## 2022-05-10 DIAGNOSIS — R232 Flushing: Secondary | ICD-10-CM | POA: Insufficient documentation

## 2022-05-10 NOTE — Assessment & Plan Note (Signed)
Hemorrhoids resistant to conservative treatment thus far. Patient still having intermittent painful hemorrhoids and blood in stool. Refer to GI. CBC today.

## 2022-05-10 NOTE — Assessment & Plan Note (Signed)
Patient concerned about possible per-menopause and asks about testing to determine. Discussed that because she is still having regular periods, the hormone blood work will not be very revealing.

## 2022-05-10 NOTE — Assessment & Plan Note (Signed)
BP today at goal with regular nifedipine use only. Patient has not started losartan yet due to concerns over side effects. Will continue nifedipine today without adjustments. Could consider increasing from 30 to 60 mg daily in future if patient amenable.

## 2022-05-13 ENCOUNTER — Other Ambulatory Visit: Payer: Self-pay

## 2022-05-13 ENCOUNTER — Ambulatory Visit (HOSPITAL_COMMUNITY)
Admission: RE | Admit: 2022-05-13 | Discharge: 2022-05-13 | Disposition: A | Discharge status: Home / Self Care | Payer: Medicaid Other | Source: Ambulatory Visit | Attending: Family Medicine | Admitting: Family Medicine

## 2022-05-13 VITALS — BP 161/101 | HR 88 | Temp 98.8°F | Resp 18

## 2022-05-13 DIAGNOSIS — I10 Essential (primary) hypertension: Secondary | ICD-10-CM | POA: Diagnosis not present

## 2022-05-13 DIAGNOSIS — J0100 Acute maxillary sinusitis, unspecified: Secondary | ICD-10-CM

## 2022-05-13 DIAGNOSIS — H9201 Otalgia, right ear: Secondary | ICD-10-CM

## 2022-05-13 MED ORDER — AMOXICILLIN-POT CLAVULANATE 875-125 MG PO TABS: 1.0000 | ORAL_TABLET | Freq: Two times a day (BID) | ORAL | 0 refills | Status: DC

## 2022-05-13 MED ORDER — PREDNISONE 10 MG (21) PO TBPK: ORAL_TABLET | Freq: Every day | ORAL | 0 refills | Status: DC

## 2022-05-13 NOTE — ED Provider Notes (Signed)
[place]   [id number] 05/13/22 Arrival Time: [id number]  ASSESSMENT & PLAN:  1. Otalgia of right ear   2. Acute non-recurrent maxillary sinusitis   3. Elevated blood pressure reading with diagnosis of hypertension    No s/s of hypertensive urgency. Begin: Meds ordered this encounter  Medications   amoxicillin-clavulanate (AUGMENTIN) 875-125 MG tablet    Sig: Take 1 tablet by mouth every 12 (twelve) hours.    Dispense:  20 tablet    Refill:  0   predniSONE (STERAPRED UNI-PAK 21 TAB) 10 MG (21) TBPK tablet    Sig: Take by mouth daily. Take as directed.    Dispense:  21 tablet    Refill:  0   May benefit from ENT evaluation should symptoms persist.    Follow-up Information     Ezequiel Essex, MD.   Specialty: Family Medicine Why: As needed. Contact information: [place] [phone]         Schedule an appointment as soon as possible for a visit  with [facility], [place].   Contact information: [address] [place] [address] [phone]                  Discharge Instructions      Your blood pressure was noted to be elevated during your visit today. If you are currently taking medication for high blood pressure, please ensure you are taking this as directed. If you do not have a history of high blood pressure and your blood pressure remains persistently elevated, you may need to begin taking a medication at some point. You may return here within the next few days to recheck if unable to see your primary care provider or if you do not have a one.  BP (!) 161/101   Pulse 88   Temp 98.8 F (37.1 C)   Resp 18   LMP 04/20/2022 (Approximate)   SpO2 97%   BP Readings from Last 3 Encounters:  05/13/22 (!) 161/101  05/08/22 132/88  04/28/22 (!) 148/96       Reviewed expectations re: course of current medical issues. Questions answered. Outlined signs and symptoms  indicating need for more acute intervention. Patient verbalized understanding. After Visit Summary given.   SUBJECTIVE: History from: patient.  Gail Bass is a 47 y.o. female who presents with complaint of nasal congestion, post-nasal drainage, and sinus pain. Onset gradual,  > 1 week ago; R ear has been bothering her for awhile . Denies ear drainage/bleeding. Mild coughing. No SOB. Fever: denies. Overall normal PO intake without n/v.  History of frequent sinus infections: no. No specific aggravating or alleviating factors reported. Social History   Tobacco Use  Smoking Status Every Day   Packs/day: 0.50   Types: Cigarettes   Start date: 11/19/2012  Smokeless Tobacco Never  Tobacco Comments   Reports attempting cessation with patches.    Increased blood pressure noted today. Reports that she is treated for HTN. Denies CP.   OBJECTIVE:  Vitals:   05/13/22 1406  BP: (!) 161/101  Pulse: 88  Resp: 18  Temp: 98.8 F (37.1 C)  SpO2: 97%     General appearance: alert; no distress HEENT: nasal congestion; clear runny nose; throat irritation secondary to post-nasal drainage; bilateral maxillary tenderness to palpation; turbinates boggy; R TM with serous otitis Neck: supple without LAD; trachea midline Lungs: unlabored respirations, symmetrical air entry; cough: mild and dry; no respiratory distress  Skin: warm and dry Psychological: alert and cooperative; normal mood and affect  Allergies  Allergen Reactions   Doxycycline     REACTION: Nausea   Lorcet [Hydrocodone-Acetaminophen] Nausea And Vomiting and Rash    Past Medical History:  Diagnosis Date   Abscess of buttock 08/13/2019   Asthma    Bacterial vaginosis    Boil 06/14/2019   BV (bacterial vaginosis) 09/13/2008        Hypertension 05/11/2013   Kidney stone    OVARIAN CYST, LEFT 10/29/2008   Qualifier: Diagnosis of  By: Drue Flirt  MD, Taineisha     Pain in the chest 06/06/2015   Painful lumpy right breast  03/21/2013   Recurrent cold sores 12/05/2015   Recurrent sinusitis 06/06/2015   Sinus congestion 04/25/2018   TUBAL PREGNANCY 09/13/2008   Qualifier: History of  By: Drue Flirt  MD, Taineisha     Vaginal discharge 01/10/2014   Vaginal discharge 01/10/2014   Family History  Problem Relation Age of Onset   Arrhythmia Father        Pacemakers placed didn't take passed away 3 moths later   Arrhythmia Sister        Pacemaker didn't take to body passed away 3 months ;later   Hypertension Maternal Aunt    Heart failure Maternal Grandmother    Hypertension Maternal Grandfather    Diabetes Other    CAD Other    Heart Problems Mother    Heart failure Mother    Social History   Socioeconomic History   Marital status: Single    Spouse name: Not on file   Number of children: Not on file   Years of education: Not on file   Highest education level: Not on file  Occupational History   Not on file  Tobacco Use   Smoking status: Every Day    Packs/day: 0.50    Types: Cigarettes    Start date: 11/19/2012   Smokeless tobacco: Never   Tobacco comments:    Reports attempting cessation with patches.   Vaping Use   Vaping Use: Never used  Substance and Sexual Activity   Alcohol use: No   Drug use: Yes    Types: Marijuana   Sexual activity: Yes    Partners: Male    Birth control/protection: Condom  Other Topics Concern   Not on file  Social History Narrative   Has 3 children and one adopted daughter.   Works.    [employer].   Mother lives in [place]. They are estranged because her mother practices withcraft.    Social Determinants of Health   Financial Resource Strain: Not on file  Food Insecurity: Not on file  Transportation Needs: Not on file  Physical Activity: Not on file  Stress: Not on file  Social Connections: Not on file  Intimate Partner Violence: Not on file             Vanessa Kick, MD 05/13/22 1845

## 2022-05-13 NOTE — Discharge Instructions (Signed)
Your blood pressure was noted to be elevated during your visit today. If you are currently taking medication for high blood pressure, please ensure you are taking this as directed. If you do not have a history of high blood pressure and your blood pressure remains persistently elevated, you may need to begin taking a medication at some point. You may return here within the next few days to recheck if unable to see your primary care provider or if you do not have a one.  BP (!) 161/101   Pulse 88   Temp 98.8 F (37.1 C)   Resp 18   LMP 04/20/2022 (Approximate)   SpO2 97%   BP Readings from Last 3 Encounters:  05/13/22 (!) 161/101  05/08/22 132/88  04/28/22 (!) 148/96

## 2022-05-13 NOTE — ED Triage Notes (Signed)
Pt reports Rt ear problems. Pt last saw PCP 05-08-22 for ear problem . Pt still has RT ear pain , nasal drip , HA. Pt also was nauseated today. Pt still has a cough.

## 2022-06-09 ENCOUNTER — Other Ambulatory Visit: Payer: Self-pay | Admitting: Family Medicine

## 2022-06-17 ENCOUNTER — Ambulatory Visit (HOSPITAL_COMMUNITY)
Admission: RE | Admit: 2022-06-17 | Discharge: 2022-06-17 | Disposition: A | Discharge status: Home / Self Care | Payer: Medicaid Other | Source: Ambulatory Visit | Attending: Internal Medicine | Admitting: Internal Medicine

## 2022-06-17 ENCOUNTER — Encounter (HOSPITAL_COMMUNITY): Payer: Self-pay

## 2022-06-17 VITALS — BP 140/88 | HR 86 | Temp 98.7°F | Resp 18

## 2022-06-17 DIAGNOSIS — S29012A Strain of muscle and tendon of back wall of thorax, initial encounter: Secondary | ICD-10-CM

## 2022-06-17 LAB — POCT URINE PREGNANCY: Preg Test, Ur: NEGATIVE

## 2022-06-17 MED ORDER — KETOROLAC TROMETHAMINE 30 MG/ML IJ SOLN
30.0000 mg | Freq: Once | INTRAMUSCULAR | Status: AC
  Administered 2022-06-17: 30 mg via INTRAMUSCULAR

## 2022-06-17 MED ORDER — METHOCARBAMOL 500 MG PO TABS: 500.0000 mg | ORAL_TABLET | Freq: Two times a day (BID) | ORAL | 0 refills | Status: DC

## 2022-06-17 MED ORDER — KETOROLAC TROMETHAMINE 30 MG/ML IJ SOLN
INTRAMUSCULAR | Status: AC
  Filled 2022-06-17: qty 1

## 2022-06-17 NOTE — Discharge Instructions (Addendum)
Your physical exam was reassuring. Your EKG was normal, no signs of a heart attack. Your exam was consistent with a muscle strain. Please take the muscles relaxers up to twice daily, do not drink or dive on these medications as they may make you drowsy. You can massage the area, do heat and warm epsom salt baths with gentle stretching.   Your blood pressure was slightly elevated at 140/88, please continue to take your blood pressure medication at home and continue to check your blood pressure. Please stop smoking as this can elevate your blood pressure and lead to cardiovascular disease.   Please seek immediate care if you develop chest pain, shortness of breath, or any new concerning symptoms.

## 2022-06-17 NOTE — ED Provider Notes (Signed)
[facility]    CSN: [id number] Arrival date & time: 06/17/22  1338      History   Chief Complaint Chief Complaint  Patient presents with   Shoulder Pain    Entered by patient    HPI Gail Bass is a 47 y.o. female.   Patient presents to clinic for left shoulder pain that has been ongoing over the past 2 days.  She is concerned that this is cardiovascular in nature, does have a strong family history of cardiovascular disease.  Denies chest pain, shortness of breath or pain radiation.  She does smoke cigarettes and has a history of asthma.  Reports she is trying to quit cigarettes.  Over the weekend she did take care of her special needs granddaughter who is 30 to 40 pounds and he needs to be carried everywhere.  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 Pain in her left shoulder that radiates down to her back. She noticed when she was at a rally yesterday and felt like her back was in a 'spasam.' Her bf massaged her yesterday and this helped.     The history is provided by the patient and medical records.  Shoulder Pain Associated symptoms: back pain and fatigue   Associated symptoms: no fever     Past Medical History:  Diagnosis Date   Abscess of buttock 08/13/2019   Asthma    Bacterial vaginosis    Boil 06/14/2019   BV (bacterial vaginosis) 09/13/2008        Hypertension 05/11/2013   Kidney stone    OVARIAN CYST, LEFT 10/29/2008   Qualifier: Diagnosis of  By: Lanier Prude  MD, Taineisha     Pain in the chest 06/06/2015   Painful lumpy right breast 03/21/2013   Recurrent cold sores 12/05/2015   Recurrent sinusitis 06/06/2015   Sinus congestion 04/25/2018   TUBAL PREGNANCY 09/13/2008   Qualifier: History of  By: Lanier Prude  MD, Taineisha     Vaginal discharge 01/10/2014   Vaginal discharge 01/10/2014    Patient Active Problem List   Diagnosis Date Noted   Hot flashes 05/10/2022   Milia of right  eyelid 08/27/2021   Viral illness 02/27/2021   Back pain 02/13/2021   Overactive bladder 07/03/2020   Levator spasm 07/03/2020   Dyspareunia, female 07/03/2020   Mixed stress and urge urinary incontinence 04/11/2020   History of COVID-19 03/13/2020   Seasonal allergies 06/14/2019   PTSD (post-traumatic stress disorder) 12/05/2015   External hemorrhoids 08/07/2015   Vaginal discharge 01/10/2014   Hypertension 05/11/2013   Tobacco use disorder 03/21/2013   ASTHMA, INTERMITTENT 12/23/2009   MIGRAINE HEADACHE 09/13/2008   MENORRHAGIA 09/13/2008   Depression 06/21/2008    Past Surgical History:  Procedure Laterality Date   CYSTECTOMY     CYSTOSCOPY WITH RETROGRADE PYELOGRAM, URETEROSCOPY AND STENT PLACEMENT Left 02/22/2014   Procedure: CYSTOSCOPY WITH RETROGRADE PYELOGRAM, URETEROSCOPY, STONE BASKET EXTRACTION ;  Surgeon: Kathi Ludwig, MD;  Location: [facility];  Service: Urology;  Laterality: Left;   tailbone cyst     TUBAL LIGATION      OB History     Gravida  6   Para  3   Term      Preterm      AB  3   Living  3      SAB  3   IAB      Ectopic      Multiple  Live Births               Home Medications    Prior to Admission medications   Medication Sig Start Date End Date Taking? Authorizing Provider  methocarbamol (ROBAXIN) 500 MG tablet Take 1 tablet (500 mg total) by mouth 2 (two) times daily. 06/17/22  Yes Rinaldo Ratel, [place] N, FNP  acyclovir (ZOVIRAX) 400 MG tablet TAKE 1 TABLET(400 MG) BY MOUTH TWICE DAILY 04/29/22   Fayette Pho, MD  albuterol (VENTOLIN HFA) 108 (90 Base) MCG/ACT inhaler Inhale 2 puffs into the lungs every 4 (four) hours as needed for wheezing or shortness of breath. 04/14/22   Carlisle Beers, FNP  cetirizine (ZYRTEC) 10 MG tablet TAKE 1 TABLET(10 MG) BY MOUTH DAILY 08/27/21   Fayette Pho, MD  fluticasone [facility]) 50 MCG/ACT nasal spray SHAKE LIQUID AND USE 2 SPRAYS IN [facility] NOSTRIL DAILY 04/29/22   Fayette Pho, MD  hydrocortisone (ANUSOL-HC) 2.5 % rectal cream Place 1 Application rectally 2 (two) times daily. 08/27/21   Fayette Pho, MD  hydrocortisone (ANUSOL-HC) 25 MG suppository Place 1 suppository (25 mg total) rectally 2 (two) times daily. 08/27/21   Fayette Pho, MD  ibuprofen (ADVIL) 600 MG tablet Take 1 tablet (600 mg total) by mouth every 6 (six) hours as needed. 08/13/19   McDonald, Mia A, PA-C  losartan (COZAAR) 25 MG tablet Take 1 tablet (25 mg total) by mouth at bedtime. 04/28/22   Maury Dus, MD  montelukast (SINGULAIR) 10 MG tablet Take 1 tablet (10 mg total) by mouth at bedtime. 06/13/19   Shirley, [place], DO  nicotine (NICODERM CQ - DOSED IN MG/24 HOURS) 14 mg/24hr patch Place 1 patch (14 mg total) onto the skin in the morning. 03/20/21   Westley Chandler, MD  NIFEdipine (PROCARDIA-XL/NIFEDICAL-XL) 30 MG 24 hr tablet TAKE 1 TABLET(30 MG) BY MOUTH AT BEDTIME 06/09/22   Fayette Pho, MD  phenylephrine-shark liver oil-mineral oil-petrolatum (PREPARATION H) 0.25-14-74.9 % rectal ointment Place 1 Application rectally 2 (two) times daily as needed for hemorrhoids. 05/08/22   Fayette Pho, MD  shark liver oil-cocoa butter (PREPARATION H) 0.25-88.44 % suppository Place 1 suppository rectally as needed for hemorrhoids. 05/08/22   Fayette Pho, MD    Family History Family History  Problem Relation Age of Onset   Arrhythmia Father        Pacemakers placed didn't take passed away 3 moths later   Arrhythmia Sister        Pacemaker didn't take to body passed away 3 months ;later   Hypertension Maternal Aunt    Heart failure Maternal Grandmother    Hypertension Maternal Grandfather    Diabetes Other    CAD Other    Heart Problems Mother    Heart failure Mother     Social History Social History   Tobacco Use   Smoking status: Every Day    Packs/day: .5    Types: Cigarettes    Start date: 11/19/2012   Smokeless tobacco: Never   Tobacco comments:    Reports attempting  cessation with patches.   Vaping Use   Vaping Use: Never used  Substance Use Topics   Alcohol use: No   Drug use: Yes    Types: Marijuana     Allergies   Doxycycline and Lorcet [hydrocodone-acetaminophen]   Review of Systems Review of Systems  Constitutional:  Positive for fatigue. Negative for chills and fever.  HENT:  Negative for sore throat.   Respiratory:  Negative for cough and shortness  of breath.   Cardiovascular:  Negative for chest pain.  Gastrointestinal:  Negative for abdominal pain.  Musculoskeletal:  Positive for arthralgias and back pain.  Neurological:  Negative for headaches.     Physical Exam Triage Vital Signs ED Triage Vitals  Enc Vitals Group     BP 06/17/22 1412 (!) 140/88     Pulse Rate 06/17/22 1408 86     Resp 06/17/22 1408 18     Temp 06/17/22 1408 98.7 F (37.1 C)     Temp Source 06/17/22 1408 Oral     SpO2 06/17/22 1408 98 %     Weight --      Height --      Head Circumference --      Peak Flow --      Pain Score 06/17/22 1408 8     Pain Loc --      Pain Edu? --      Excl. in GC? --    No data found.  Updated Vital Signs BP (!) 140/88 (BP Location: Right Arm)   Pulse 86   Temp 98.7 F (37.1 C) (Oral)   Resp 18   LMP 05/20/2022   SpO2 98%   Visual Acuity Right Eye Distance:   Left Eye Distance:   Bilateral Distance:    Right Eye Near:   Left Eye Near:    Bilateral Near:     Physical Exam Vitals and nursing note reviewed.  Constitutional:      Appearance: Normal appearance.  HENT:     Head: Normocephalic and atraumatic.     Right Ear: External ear normal.     Left Ear: External ear normal.     Nose: Nose normal.     Mouth/Throat:     Mouth: Mucous membranes are moist.  Eyes:     General: No scleral icterus.       Right eye: No discharge.        Left eye: No discharge.     Pupils: Pupils are equal, round, and reactive to light.  Cardiovascular:     Rate and Rhythm: Normal rate and regular rhythm.     Pulses:  Normal pulses.     Heart sounds: Normal heart sounds. No murmur heard. Pulmonary:     Effort: Pulmonary effort is normal. No respiratory distress.     Breath sounds: Normal breath sounds.  Musculoskeletal:        General: Tenderness present. No swelling, deformity or signs of injury. Normal range of motion.     Cervical back: Normal range of motion and neck supple.  Skin:    General: Skin is warm and dry.     Capillary Refill: Capillary refill takes less than 2 seconds.  Neurological:     Mental Status: She is alert and oriented to person, place, and time.  Psychiatric:        Mood and Affect: Mood normal.        Behavior: Behavior normal.      UC Treatments / Results  Labs (all labs ordered are listed, but only abnormal results are displayed) Labs Reviewed  POCT URINE PREGNANCY    EKG   Radiology No results found.  Procedures Procedures (including critical care time)  Medications Ordered in UC Medications  ketorolac (TORADOL) 30 MG/ML injection 30 mg (30 mg Intramuscular Given 06/17/22 1451)    Initial Impression / Assessment and Plan / UC Course  I have reviewed the triage vital signs and the nursing notes.  Pertinent labs & imaging results that were available during my care of the patient were reviewed by me and considered in my medical decision making (see chart for details).  Vitals and triage reviewed. Patient is hemodynamically stable. Left thoracic / scapular pain with associated tightness, relief with massage. Denies CP, SOB, EKG w/ NSR, w/o STE or STD. Patient is a smoker, hx of HTN on Nifedipine with adequate control. Suspect MS injury, given IM Toradol in clinic for pain, trial muscle relaxers. Advised rest, heat and gentle stretching. Low concern for cardiovascular etiology. Discussed symptom management and return / follow-up care, patient verbalized understanding, no questions at this time.        Final Clinical Impressions(s) / UC Diagnoses   Final  diagnoses:  Strain of thoracic back region     Discharge Instructions      Your physical exam was reassuring. Your EKG was normal, no signs of a heart attack. Your exam was consistent with a muscle strain. Please take the muscles relaxers up to twice daily, do not drink or dive on these medications as they may make you drowsy. You can massage the area, do heat and warm epsom salt baths with gentle stretching.   Your blood pressure was slightly elevated at 140/88, please continue to take your blood pressure medication at home and continue to check your blood pressure. Please stop smoking as this can elevate your blood pressure and lead to cardiovascular disease.   Please seek immediate care if you develop chest pain, shortness of breath, or any new concerning symptoms.       ED Prescriptions     Medication Sig Dispense Auth. Provider   methocarbamol (ROBAXIN) 500 MG tablet Take 1 tablet (500 mg total) by mouth 2 (two) times daily. 20 tablet Thais Silberstein, [place] N, [place]      PDMP not reviewed this encounter.   Nashira Mcglynn, [place] N, [place] 06/17/22 [phone]

## 2022-06-17 NOTE — ED Triage Notes (Signed)
Pt c/o lt shoulder pain x2 days. States having headaches and fatigue x1wk. States started b/p meds a few months ago and family hx of cardiac disease. Denies injury but does heavy lifting at work.

## 2022-06-18 ENCOUNTER — Telehealth: Payer: Self-pay | Admitting: Emergency Medicine

## 2022-06-18 NOTE — Telephone Encounter (Signed)
Patient clled to ask if she is able to take ibuprofen with muscle relazer prescriber.  Left voicemail giving her the go ahead.

## 2022-07-10 ENCOUNTER — Ambulatory Visit (HOSPITAL_COMMUNITY)
Admission: RE | Admit: 2022-07-10 | Discharge: 2022-07-10 | Disposition: A | Discharge status: Home / Self Care | Payer: Medicaid Other | Source: Ambulatory Visit | Attending: Family Medicine | Admitting: Family Medicine

## 2022-07-10 ENCOUNTER — Encounter (HOSPITAL_COMMUNITY): Payer: Self-pay

## 2022-07-10 VITALS — BP 154/93 | HR 91 | Temp 98.6°F | Resp 16

## 2022-07-10 DIAGNOSIS — R109 Unspecified abdominal pain: Secondary | ICD-10-CM | POA: Insufficient documentation

## 2022-07-10 DIAGNOSIS — N760 Acute vaginitis: Secondary | ICD-10-CM | POA: Diagnosis not present

## 2022-07-10 DIAGNOSIS — R3129 Other microscopic hematuria: Secondary | ICD-10-CM | POA: Insufficient documentation

## 2022-07-10 LAB — POCT URINALYSIS DIP (MANUAL ENTRY)
Bilirubin, UA: NEGATIVE
Glucose, UA: NEGATIVE mg/dL
Ketones, POC UA: NEGATIVE mg/dL
Leukocytes, UA: NEGATIVE
Nitrite, UA: NEGATIVE
Protein Ur, POC: NEGATIVE mg/dL
Spec Grav, UA: >=1.03 — AB (ref 1.010–1.025)
Urobilinogen, UA: 0.2 E.U./dL
pH, UA: 5.5 (ref 5.0–8.0)

## 2022-07-10 LAB — POCT URINE PREGNANCY: Preg Test, Ur: NEGATIVE

## 2022-07-10 MED ORDER — KETOROLAC TROMETHAMINE 60 MG/2ML IM SOLN
60.0000 mg | Freq: Once | INTRAMUSCULAR | Status: AC
  Administered 2022-07-10: 60 mg via INTRAMUSCULAR

## 2022-07-10 MED ORDER — ONDANSETRON 4 MG PO TBDP: 4.0000 mg | ORAL_TABLET | Freq: Three times a day (TID) | ORAL | 0 refills | Status: DC | PRN

## 2022-07-10 MED ORDER — KETOROLAC TROMETHAMINE 60 MG/2ML IM SOLN
INTRAMUSCULAR | Status: AC
  Filled 2022-07-10: qty 2

## 2022-07-10 MED ORDER — TAMSULOSIN HCL 0.4 MG PO CAPS: 0.4000 mg | ORAL_CAPSULE | Freq: Every day | ORAL | 0 refills | Status: DC

## 2022-07-10 NOTE — ED Provider Notes (Signed)
[facility]    CSN: [id number] Arrival date & time: 07/10/22  1318      History   Chief Complaint Chief Complaint  Patient presents with   Abdominal Pain    HPI Gail Bass is a 47 y.o. female.   Patient presents to clinic for ongoing lower abdominal pain.  She reports some right-sided lower back pain, however, this can be from a musculoskeletal strain she obtained from lifting her granddaughter.  She has had the same sexual partner for the past 7 years and is not really concerned for sexually transmitted infections.  She has reported increased and white thick discharge and thinks she might of BV, yeast or urinary tract infection.  She has been having intermittent pains with intercourse, this has been ongoing.  She reports it is time for her annual Pap smear.  She is worried that she may have a kidney stone.  She denies fevers or dysuria. Denies blood in discharge or after intercourse.   Denies any hematuria, had a tubal ligation and is not concerned for pregnancy.    The history is provided by the patient and medical records.  Abdominal Pain Associated symptoms: vaginal discharge   Associated symptoms: no chills, no diarrhea, no dysuria, no fever, no hematuria, no nausea, no vaginal bleeding and no vomiting     Past Medical History:  Diagnosis Date   Abscess of buttock 08/13/2019   Asthma    Bacterial vaginosis    Boil 06/14/2019   BV (bacterial vaginosis) 09/13/2008        Hypertension 05/11/2013   Kidney stone    OVARIAN CYST, LEFT 10/29/2008   Qualifier: Diagnosis of  By: Lanier Prude  MD, Taineisha     Pain in the chest 06/06/2015   Painful lumpy right breast 03/21/2013   Recurrent cold sores 12/05/2015   Recurrent sinusitis 06/06/2015   Sinus congestion 04/25/2018   TUBAL PREGNANCY 09/13/2008   Qualifier: History of  By: Lanier Prude  MD, Taineisha     Vaginal discharge 01/10/2014   Vaginal discharge 01/10/2014    Patient Active Problem List   Diagnosis Date  Noted   Hot flashes 05/10/2022   Milia of right eyelid 08/27/2021   Viral illness 02/27/2021   Back pain 02/13/2021   Overactive bladder 07/03/2020   Levator spasm 07/03/2020   Dyspareunia, female 07/03/2020   Mixed stress and urge urinary incontinence 04/11/2020   History of COVID-19 03/13/2020   Seasonal allergies 06/14/2019   PTSD (post-traumatic stress disorder) 12/05/2015   External hemorrhoids 08/07/2015   Vaginal discharge 01/10/2014   Hypertension 05/11/2013   Tobacco use disorder 03/21/2013   ASTHMA, INTERMITTENT 12/23/2009   MIGRAINE HEADACHE 09/13/2008   MENORRHAGIA 09/13/2008   Depression 06/21/2008    Past Surgical History:  Procedure Laterality Date   CYSTECTOMY     CYSTOSCOPY WITH RETROGRADE PYELOGRAM, URETEROSCOPY AND STENT PLACEMENT Left 02/22/2014   Procedure: CYSTOSCOPY WITH RETROGRADE PYELOGRAM, URETEROSCOPY, STONE BASKET EXTRACTION ;  Surgeon: Kathi Ludwig, MD;  Location: [facility];  Service: Urology;  Laterality: Left;   tailbone cyst     TUBAL LIGATION      OB History     Gravida  6   Para  3   Term      Preterm      AB  3   Living  3      SAB  3   IAB      Ectopic      Multiple  Live Births               Home Medications    Prior to Admission medications   Medication Sig Start Date End Date Taking? Authorizing Provider  ondansetron (ZOFRAN-ODT) 4 MG disintegrating tablet Take 1 tablet (4 mg total) by mouth every 8 (eight) hours as needed for nausea or vomiting. 07/10/22  Yes Rinaldo Ratel, [place] N, FNP  tamsulosin (FLOMAX) 0.4 MG CAPS capsule Take 1 capsule (0.4 mg total) by mouth daily. 07/10/22  Yes Rinaldo Ratel, [place] N, FNP  acyclovir (ZOVIRAX) 400 MG tablet TAKE 1 TABLET(400 MG) BY MOUTH TWICE DAILY 04/29/22   Fayette Pho, MD  albuterol (VENTOLIN HFA) 108 (90 Base) MCG/ACT inhaler Inhale 2 puffs into the lungs every 4 (four) hours as needed for wheezing or shortness of breath. 04/14/22   Carlisle Beers, FNP   cetirizine (ZYRTEC) 10 MG tablet TAKE 1 TABLET(10 MG) BY MOUTH DAILY 08/27/21   Fayette Pho, MD  fluticasone [facility]) 50 MCG/ACT nasal spray SHAKE LIQUID AND USE 2 SPRAYS IN [facility] NOSTRIL DAILY 04/29/22   Fayette Pho, MD  hydrocortisone (ANUSOL-HC) 2.5 % rectal cream Place 1 Application rectally 2 (two) times daily. 08/27/21   Fayette Pho, MD  hydrocortisone (ANUSOL-HC) 25 MG suppository Place 1 suppository (25 mg total) rectally 2 (two) times daily. 08/27/21   Fayette Pho, MD  ibuprofen (ADVIL) 600 MG tablet Take 1 tablet (600 mg total) by mouth every 6 (six) hours as needed. 08/13/19   McDonald, Mia A, PA-C  losartan (COZAAR) 25 MG tablet Take 1 tablet (25 mg total) by mouth at bedtime. 04/28/22   Maury Dus, MD  methocarbamol (ROBAXIN) 500 MG tablet Take 1 tablet (500 mg total) by mouth 2 (two) times daily. 06/17/22   Maryse Brierley, [place] N, FNP  montelukast (SINGULAIR) 10 MG tablet Take 1 tablet (10 mg total) by mouth at bedtime. 06/13/19   Shirley, [place], DO  nicotine (NICODERM CQ - DOSED IN MG/24 HOURS) 14 mg/24hr patch Place 1 patch (14 mg total) onto the skin in the morning. 03/20/21   Westley Chandler, MD  NIFEdipine (PROCARDIA-XL/NIFEDICAL-XL) 30 MG 24 hr tablet TAKE 1 TABLET(30 MG) BY MOUTH AT BEDTIME 06/09/22   Fayette Pho, MD  phenylephrine-shark liver oil-mineral oil-petrolatum (PREPARATION H) 0.25-14-74.9 % rectal ointment Place 1 Application rectally 2 (two) times daily as needed for hemorrhoids. 05/08/22   Fayette Pho, MD  shark liver oil-cocoa butter (PREPARATION H) 0.25-88.44 % suppository Place 1 suppository rectally as needed for hemorrhoids. 05/08/22   Fayette Pho, MD    Family History Family History  Problem Relation Age of Onset   Arrhythmia Father        Pacemakers placed didn't take passed away 3 moths later   Arrhythmia Sister        Pacemaker didn't take to body passed away 3 months ;later   Hypertension Maternal Aunt    Heart failure Maternal  Grandmother    Hypertension Maternal Grandfather    Diabetes Other    CAD Other    Heart Problems Mother    Heart failure Mother     Social History Social History   Tobacco Use   Smoking status: Every Day    Packs/day: .5    Types: Cigarettes    Start date: 11/19/2012   Smokeless tobacco: Never   Tobacco comments:    Reports attempting cessation with patches.   Vaping Use   Vaping Use: Never used  Substance Use Topics   Alcohol use: No  Drug use: Yes    Types: Marijuana     Allergies   Doxycycline and Lorcet [hydrocodone-acetaminophen]   Review of Systems Review of Systems  Constitutional:  Negative for chills and fever.  Gastrointestinal:  Positive for abdominal pain. Negative for diarrhea, nausea and vomiting.  Genitourinary:  Positive for pelvic pain and vaginal discharge. Negative for difficulty urinating, dysuria, genital sores, hematuria, menstrual problem and vaginal bleeding.     Physical Exam Triage Vital Signs ED Triage Vitals [07/10/22 1334]  Enc Vitals Group     BP (!) 154/93     Pulse Rate 91     Resp 16     Temp 98.6 F (37 C)     Temp Source Oral     SpO2      Weight      Height      Head Circumference      Peak Flow      Pain Score 6     Pain Loc      Pain Edu?      Excl. in GC?    No data found.  Updated Vital Signs BP (!) 154/93 (BP Location: Right Arm)   Pulse 91   Temp 98.6 F (37 C) (Oral)   Resp 16   LMP 05/20/2022   Visual Acuity Right Eye Distance:   Left Eye Distance:   Bilateral Distance:    Right Eye Near:   Left Eye Near:    Bilateral Near:     Physical Exam Vitals and nursing note reviewed.  Constitutional:      Appearance: She is well-developed.  HENT:     Head: Normocephalic and atraumatic.     Mouth/Throat:     Mouth: Mucous membranes are moist.  Cardiovascular:     Rate and Rhythm: Normal rate and regular rhythm.     Heart sounds: Normal heart sounds. No murmur heard. Pulmonary:     Effort:  Pulmonary effort is normal. No respiratory distress.     Breath sounds: Normal breath sounds.  Abdominal:     General: Abdomen is flat. Bowel sounds are normal.     Palpations: Abdomen is soft.     Tenderness: There is abdominal tenderness in the suprapubic area. There is right CVA tenderness.     Hernia: No hernia is present.  Neurological:     Mental Status: She is alert.      UC Treatments / Results  Labs (all labs ordered are listed, but only abnormal results are displayed) Labs Reviewed  POCT URINALYSIS DIP (MANUAL ENTRY) - Abnormal; Notable for the following components:      Result Value   Spec Grav, UA >=1.030 (*)    Blood, UA moderate (*)    All other components within normal limits  URINE CULTURE  POCT URINE PREGNANCY  CERVICOVAGINAL ANCILLARY ONLY    EKG   Radiology No results found.  Procedures Procedures (including critical care time)  Medications Ordered in UC Medications  ketorolac (TORADOL) injection 60 mg (60 mg Intramuscular Given 07/10/22 1430)    Initial Impression / Assessment and Plan / UC Course  I have reviewed the triage vital signs and the nursing notes.  Pertinent labs & imaging results that were available during my care of the patient were reviewed by me and considered in my medical decision making (see chart for details).  Vitals and triage reviewed, patient is hemodynamically stable.  Ongoing lower abdominal pain with intercourse and increased thick white discharge.  Reports she  was on antibiotics at the beginning of the year and has been taking a probiotic which seems to have helped her symptoms.  Had a tubal ligation in the past.  Urine pregnancy negative.   Urinalysis positive for RBCs and increased SG. Will send for culture.   Cytology swab was obtained to rule out yeast or BV versus sexually transmitted infection.  Will contact patient if abnormal to start treatment.  If patient has BV, she is requesting clindamycin.  Will cover  with Flomax and given IM Toradol in clinic for potential kidney stone with red blood cells, flank pain and nausea.  Plan of care, follow-up care and return precautions reviewed, no questions at this time.    Final Clinical Impressions(s) / UC Diagnoses   Final diagnoses:  Flank pain  Other microscopic hematuria  Vaginitis and vulvovaginitis     Discharge Instructions      Overall your physical exam is reassuring.  Your urine was without obvious signs of infection, did have some red blood cells.  I am sending this off for culture and our staff will contact you if abnormal.  Your pain could be due to a kidney stone, especially with your nausea.  You can take the Zofran as needed, take the Flomax daily, be careful starting this as you may have some dizziness with position changes. You can alternate between tylenol and ibuprofen every 4-6 hours for pain and discomfort.  We have obtained a cytology swab screening for bacterial vaginosis and yeast, we will contact you if there are any abnormalities.  Please schedule your annual Pap smear and follow-up with your primary care provider.  You can return to clinic for any new concerning symptoms.      ED Prescriptions     Medication Sig Dispense Auth. Provider   tamsulosin (FLOMAX) 0.4 MG CAPS capsule Take 1 capsule (0.4 mg total) by mouth daily. 30 capsule Rinaldo Ratel, [place] N, FNP   ondansetron (ZOFRAN-ODT) 4 MG disintegrating tablet Take 1 tablet (4 mg total) by mouth every 8 (eight) hours as needed for nausea or vomiting. 20 tablet Janellie Tennison, [place] N, [place]      PDMP not reviewed this encounter.   Nyree Applegate, [place] N, [place] 07/10/22 [phone]

## 2022-07-10 NOTE — ED Triage Notes (Signed)
Pt present abdomen pain, symptom started three weeks ago.

## 2022-07-10 NOTE — Discharge Instructions (Addendum)
Overall your physical exam is reassuring.  Your urine was without obvious signs of infection, did have some red blood cells.  I am sending this off for culture and our staff will contact you if abnormal.  Your pain could be due to a kidney stone, especially with your nausea.  You can take the Zofran as needed, take the Flomax daily, be careful starting this as you may have some dizziness with position changes. You can alternate between tylenol and ibuprofen every 4-6 hours for pain and discomfort.  We have obtained a cytology swab screening for bacterial vaginosis and yeast, we will contact you if there are any abnormalities.  Please schedule your annual Pap smear and follow-up with your primary care provider.  You can return to clinic for any new concerning symptoms.

## 2022-07-11 LAB — URINE CULTURE: Culture: <10000 — AB

## 2022-07-13 ENCOUNTER — Telehealth (HOSPITAL_COMMUNITY): Payer: Self-pay | Admitting: Physician Assistant

## 2022-07-13 LAB — CERVICOVAGINAL ANCILLARY ONLY
Bacterial Vaginitis (gardnerella): POSITIVE — AB
Candida Glabrata: NEGATIVE
Candida Vaginitis: NEGATIVE
Chlamydia: NEGATIVE
Comment: NEGATIVE
Comment: NEGATIVE
Comment: NEGATIVE
Comment: NEGATIVE
Comment: NEGATIVE
Comment: NORMAL
Neisseria Gonorrhea: NEGATIVE
Trichomonas: NEGATIVE

## 2022-07-13 MED ORDER — CLINDAMYCIN HCL 300 MG PO CAPS: 300.0000 mg | ORAL_CAPSULE | Freq: Two times a day (BID) | ORAL | 0 refills | Status: DC

## 2022-07-13 NOTE — Telephone Encounter (Signed)
Patient saw positive results for bacterial vaginosis on MyChart.  She contacted our office requesting we call in treatment.  Clindamycin 300 mg twice daily for 7 days sent to pharmacy per her request.

## 2022-07-14 ENCOUNTER — Telehealth (HOSPITAL_COMMUNITY): Payer: Self-pay | Admitting: Emergency Medicine

## 2022-07-14 NOTE — Telephone Encounter (Signed)
Patient called about results, confirmed medication was sent, all questions answered

## 2022-09-08 ENCOUNTER — Ambulatory Visit (INDEPENDENT_AMBULATORY_CARE_PROVIDER_SITE_OTHER): Payer: 59 | Admitting: Family Medicine

## 2022-09-08 VITALS — BP 132/80 | HR 88 | Wt 169.0 lb

## 2022-09-08 DIAGNOSIS — L989 Disorder of the skin and subcutaneous tissue, unspecified: Secondary | ICD-10-CM

## 2022-09-08 DIAGNOSIS — J302 Other seasonal allergic rhinitis: Secondary | ICD-10-CM | POA: Diagnosis not present

## 2022-09-08 DIAGNOSIS — B001 Herpesviral vesicular dermatitis: Secondary | ICD-10-CM

## 2022-09-08 MED ORDER — ACYCLOVIR 400 MG PO TABS: ORAL_TABLET | ORAL | 2 refills | Status: DC

## 2022-09-08 MED ORDER — CETIRIZINE HCL 10 MG PO TABS: ORAL_TABLET | ORAL | 1 refills | Status: DC

## 2022-09-08 NOTE — Patient Instructions (Addendum)
We performed an incision and drainage in the office today - not much came out of it, though. Please continue to keep the area covered and reach out if you start having any fevers, swelling, or worsening symptoms.   I have sent in a refill of your acyclovir and cetirizine.

## 2022-09-08 NOTE — Progress Notes (Signed)
SUBJECTIVE:   CHIEF COMPLAINT / HPI:   Presents due to concern of cyst on R inner thigh.  She was noted to have a boil in a similar area on the R thigh a couple of years ago and received an I&D in the office.  Has a history of abscess in the buttock.  History of HSV (cold sores) on acyclovir.  First noticed 3 months ago Thought it was a boil at first Has been using OTC creams, epson salts, warm compresses Recently visited the beach. Swims a lot and walks a lot, causing friction to the area which causes it to swell up.  Has squeezed some stuff out of it including purulent white "cottage cheese" substance. Also some pus. No significant bleeding Currently pea-sized but often swells up to the size of a nickel The area is painful to touch  No fevers  Requesting refill of acyclovir and cetirizine  PERTINENT  PMH / PSH:   Past Medical History:  Diagnosis Date   Abscess of buttock 08/13/2019   Asthma    Bacterial vaginosis    Boil 06/14/2019   BV (bacterial vaginosis) 09/13/2008        Hypertension 05/11/2013   Kidney stone    OVARIAN CYST, LEFT 10/29/2008   Qualifier: Diagnosis of  By: Lanier Prude  MD, Taineisha     Pain in the chest 06/06/2015   Painful lumpy right breast 03/21/2013   Recurrent cold sores 12/05/2015   Recurrent sinusitis 06/06/2015   Sinus congestion 04/25/2018   TUBAL PREGNANCY 09/13/2008   Qualifier: History of  By: Lanier Prude  MD, Taineisha     Vaginal discharge 01/10/2014   Vaginal discharge 01/10/2014    OBJECTIVE:  BP 132/80   Pulse 88   Wt 169 lb (76.7 kg)   LMP 08/12/2022   SpO2 98%   BMI 30.91 kg/m   General: NAD, pleasant, able to participate in exam R inner thigh: See image.  Small 1 cm mobile mass appreciated.  No significant fluctuance.  Small amount of overlying skin discoloration but no surrounding erythema or warmth.    ASSESSMENT/PLAN:   1. Benign skin lesion of thigh Initially concerning for cyst/boil given history of the same improvement with I&D.   However, upon I&D procedure, unable to get any pus or any cyst sac from the wound.  This may therefore be more consistent with folliculitis/ingrown hair.  Overall, no concern for abscess or acute infection, no need for antibiotics.  Provided return precautions in regards to infection or worsening of symptoms. Encouraged continued conservative management with warm compresses and OTC pain control.  Cyst Removal/Incision and Drainage Discussed risks/benefits of procedure, and patient gave consent for I&D/excision of cyst.  Prepped skin in usual sterile fashion.  Used 8cc of 1% lidocaine with epinephrine for local anesthesia, #11 blade to make an incision, and attempted to further dissect using forceps. The lesion did not produce significant drainage or any sac.  Incision <1cm, did not suture. covered wound with sterile bandage and dressing.  EBL 1cc.  Patient tolerated procedure well.  Discussed wound care and return precautions.  2. Recurrent cold sores - acyclovir (ZOVIRAX) 400 MG tablet; TAKE 1 TABLET(400 MG) BY MOUTH TWICE DAILY  Dispense: 60 tablet; Refill: 2  3. Seasonal allergies - cetirizine (ZYRTEC) 10 MG tablet; TAKE 1 TABLET(10 MG) BY MOUTH DAILY  Dispense: 90 tablet; Refill: 1   Meds ordered this encounter  Medications   acyclovir (ZOVIRAX) 400 MG tablet    Sig: TAKE 1 TABLET(400  MG) BY MOUTH TWICE DAILY    Dispense:  60 tablet    Refill:  2   cetirizine (ZYRTEC) 10 MG tablet    Sig: TAKE 1 TABLET(10 MG) BY MOUTH DAILY    Dispense:  90 tablet    Refill:  1   Return if symptoms worsen or fail to improve.  Gail Drafts, MD [facility]

## 2022-09-24 ENCOUNTER — Encounter: Payer: Self-pay | Admitting: Family Medicine

## 2022-09-24 ENCOUNTER — Ambulatory Visit (INDEPENDENT_AMBULATORY_CARE_PROVIDER_SITE_OTHER): Payer: Medicaid Other | Admitting: Family Medicine

## 2022-09-24 ENCOUNTER — Other Ambulatory Visit (HOSPITAL_COMMUNITY)
Admission: RE | Admit: 2022-09-24 | Discharge: 2022-09-24 | Disposition: A | Discharge status: Home / Self Care | Payer: Medicaid Other | Source: Ambulatory Visit | Attending: Family Medicine | Admitting: Family Medicine

## 2022-09-24 VITALS — BP 126/90 | HR 88 | Wt 170.0 lb

## 2022-09-24 DIAGNOSIS — Z124 Encounter for screening for malignant neoplasm of cervix: Secondary | ICD-10-CM | POA: Insufficient documentation

## 2022-09-24 DIAGNOSIS — Z1231 Encounter for screening mammogram for malignant neoplasm of breast: Secondary | ICD-10-CM | POA: Diagnosis not present

## 2022-09-24 DIAGNOSIS — N920 Excessive and frequent menstruation with regular cycle: Secondary | ICD-10-CM | POA: Diagnosis not present

## 2022-09-24 DIAGNOSIS — Z1211 Encounter for screening for malignant neoplasm of colon: Secondary | ICD-10-CM | POA: Diagnosis not present

## 2022-09-24 DIAGNOSIS — Z113 Encounter for screening for infections with a predominantly sexual mode of transmission: Secondary | ICD-10-CM | POA: Insufficient documentation

## 2022-09-24 DIAGNOSIS — N898 Other specified noninflammatory disorders of vagina: Secondary | ICD-10-CM | POA: Diagnosis not present

## 2022-09-24 LAB — POCT WET PREP (WET MOUNT)
Clue Cells Wet Prep Whiff POC: NEGATIVE
Trichomonas Wet Prep HPF POC: ABSENT
WBC, Wet Prep HPF POC: NONE SEEN

## 2022-09-24 NOTE — Patient Instructions (Signed)
Dear Gail Bass,  It was great to see you today! Thank you for choosing [facility] for your primary care. Gail Bass was seen for pap smear and STI testing today.  Today we addressed: Scheduling your mammogram and colonoscopy. We will refer you to an OB/GYN as well per your request due to family history of endometrial and cervical cancer.  We will also order you a pelvic US to assess your menorrhagia and pain with sex further.  We are checking some labs today. If they are abnormal, I will call you. If they are normal, I will send you a MyChart message (if it is active) or a letter in the mail. If you do not hear about your labs in the next 2 weeks, please call the office.  Thank you for allowing me to participate in your care, Fortunato Curling, DO 09/24/2022, 2:37 PM PGY-1, [facility]

## 2022-09-24 NOTE — Assessment & Plan Note (Addendum)
Recommended getting colonoscopy for screening against colon cancer.

## 2022-09-24 NOTE — Progress Notes (Signed)
    SUBJECTIVE:   CHIEF COMPLAINT / HPI: Pap Smear  PERTINENT  PMH / PSH: HTN, HSV on acyclovir, External Hemorrhoids, Migraine Has, Menorrhagia, bilateral tubal ligation s/p tubal pregnancy.  Patient comes in today for a routine pap smear. She reports having an abnormal pap and biopsy showed some dysplastic cells, records could not be found. However, since then her pap smears have been negative. Patient continues to report having menorrhagia (uses two pads at night usually and avoids tampons because she relates them to BV) and pain with sex every time, heavy periods (2 pads to sleep at night). She is due to have her period soon. She would also like a wet prep today.   Patient shares that there is significant maternal history of cancer in her family. - Maternal aunt: stage 4 cervical cancer type B - Maternal aunt: endometrial cancer, endometriosis, adenomyosis s/p hysterectomy  OBJECTIVE:   BP (!) 126/90   Pulse 88   Wt 170 lb (77.1 kg)   LMP 08/12/2022   SpO2 98%   BMI 31.09 kg/m   General: NAD, awake and alert Cardiovascular: RRR. No M/R/G Respiratory: CTAB, normal WOB on RA. No wheezing, crackles, or rhonchi. Abdomen: soft, non-tender, non-distended. Bowel sounds normoactive Extremities: no BLE edema Neuro: A&Ox3. No focal neurological deficits. GU: Uterus size WNL, slight R>L adnexal pain, firm, smooth surface, f posterior cervix and os w/ white discharge, slight bleeding s/p pap.   ASSESSMENT/PLAN:   MENORRHAGIA Patient continues to have menorrhagia and pain with sex. Concern for endometriosis especially with family history as well. - Pelvic US to follow up - Referral to GYN  Screening for cervical cancer Obtained consent and ensured patient felt comfortable before performing routine pap + wet prep due to history of BV today. She has no complaints of abnormal discharge at this time, but does endorse some odor (associates with excessive sweating in the area).  - Pap + Wet  prep performed today - Discussed with patient that there may be some slight bleeding after doing her pap smear, she understood  Encounter for screening mammogram for breast cancer Ordered mammogram today for surveillance.  Screening for colon cancer Recommended getting colonoscopy for screening against colon cancer.   Fortunato Curling, DO [facility]

## 2022-09-24 NOTE — Assessment & Plan Note (Signed)
Patient continues to have menorrhagia and pain with sex. Concern for endometriosis especially with family history as well. - Pelvic US to follow up - Referral to GYN

## 2022-09-24 NOTE — Assessment & Plan Note (Addendum)
Obtained consent and ensured patient felt comfortable before performing routine pap + wet prep due to history of BV today. She has no complaints of abnormal discharge at this time, but does endorse some odor (associates with excessive sweating in the area).  - Pap + Wet prep performed today - Discussed with patient that there may be some slight bleeding after doing her pap smear, she understood

## 2022-09-24 NOTE — Assessment & Plan Note (Signed)
Ordered mammogram today for surveillance.

## 2022-09-29 ENCOUNTER — Telehealth: Payer: Self-pay | Admitting: Family Medicine

## 2022-09-29 ENCOUNTER — Ambulatory Visit (HOSPITAL_COMMUNITY): Payer: Medicaid Other

## 2022-09-29 NOTE — Telephone Encounter (Signed)
Called patient to discuss recent Pap smear results which showed ASCUS. Discussed that per the ASCCP guidelines she would not require further follow-up at this moment however in 3 years we would do a repeat Pap. Also shared with her that her HPV and wet prep were negative.   Due to family history, patient is still concerned about her results and would still like to follow-up with OB/GYN to discuss her menorrhagia along with possible hysterectomy. She also had to reschedule her pelvic ultrasound due to starting her menstrual cycle. She shared with me that her aunt recently passed away in the hospital due to stage IV cervical cancer, which concerns her further. I expressed understanding and my condolences.  Advised patient to try to get that her pelvic US done at her earliest convenience and that OB/GYN referral was placed at her last visit and they should be reaching out to make an appointment within 1-2 weeks. Patient understood.  Fortunato Curling, DO [facility], PGY-1 09/29/22 5:59 PM

## 2022-10-08 ENCOUNTER — Ambulatory Visit (HOSPITAL_COMMUNITY): Payer: Medicaid Other

## 2022-10-11 NOTE — Progress Notes (Unsigned)
    SUBJECTIVE:   CHIEF COMPLAINT / HPI: STD screening  Patient presents today for STD testing due to possible exposure.  She reports no vaginal discharge or vaginal odor.  She has been with the same partner for the past 8 years.  Patient is concerned that her teenage daughter may have used her sex toy prior to her using it without her knowledge and recently tested positive for Chlamydia and Candida.  She reports cleaning her sex toy prior to and post use, however would like to get tested today to be positive.   PERTINENT  PMH / PSH: HTN, HSV on acyclovir, External Hemorrhoids, Migraine Has, Menorrhagia, bilateral tubal ligation s/p tubal pregnancy.   OBJECTIVE:   BP 130/70   Pulse 82   Ht 5\' 2"  (1.575 m)   Wt 169 lb (76.7 kg)   LMP 10/04/2022   SpO2 98%   BMI 30.91 kg/m   General: NAD, awake and alert HEENT: Normocephalic, atraumatic. Conjunctiva normal. No nasal discharge.  Respiratory: Normal WOB on RA Extremities: No BLE edema Skin: Warm, well perfused, and dry Neuro: A&Ox3. No focal neurological deficits. Cranial nerves II-XII intact. Sensation and motor function intact bilaterally. GU: Normally developed genitalia with no external lesions or eruptions. Vagina and cervix show no lesions, inflammation, discharge or tenderness.   ASSESSMENT/PLAN:   Screening examination for STD (sexually transmitted disease) Concern for exposure. - STD screening - HIV and RPR - Discussed safe sex and will follow-up with results   Fortunato Curling, DO [facility]

## 2022-10-12 ENCOUNTER — Other Ambulatory Visit (HOSPITAL_COMMUNITY)
Admission: RE | Admit: 2022-10-12 | Discharge: 2022-10-12 | Disposition: A | Discharge status: Home / Self Care | Payer: Medicaid Other | Source: Ambulatory Visit | Attending: Family Medicine | Admitting: Family Medicine

## 2022-10-12 ENCOUNTER — Encounter: Payer: Self-pay | Admitting: Family Medicine

## 2022-10-12 ENCOUNTER — Ambulatory Visit: Payer: Medicaid Other | Admitting: Family Medicine

## 2022-10-12 ENCOUNTER — Ambulatory Visit (HOSPITAL_COMMUNITY)
Admission: RE | Admit: 2022-10-12 | Discharge: 2022-10-12 | Disposition: A | Discharge status: Home / Self Care | Payer: Medicaid Other | Source: Ambulatory Visit | Attending: Family Medicine | Admitting: Family Medicine

## 2022-10-12 VITALS — BP 130/70 | HR 82 | Ht 62.0 in | Wt 169.0 lb

## 2022-10-12 DIAGNOSIS — Z113 Encounter for screening for infections with a predominantly sexual mode of transmission: Secondary | ICD-10-CM | POA: Diagnosis not present

## 2022-10-12 DIAGNOSIS — N920 Excessive and frequent menstruation with regular cycle: Secondary | ICD-10-CM | POA: Diagnosis not present

## 2022-10-12 NOTE — Patient Instructions (Signed)
It was great to see you today! Thank you for choosing [facility] for your primary care. Gail Bass was seen for STD screening.  Today we addressed: Safe sex ASCUS on pap - follow up in 3 years for repeat pap  If you haven't already, sign up for My Chart to have easy access to your labs results, and communication with your primary care physician.  You should return to our clinic No follow-ups on file. Please arrive 15 minutes before your appointment to ensure smooth check in process.  We appreciate your efforts in making this happen.  Thank you for allowing me to participate in your care, Fortunato Curling, DO 10/12/2022, 10:28 AM PGY-1, [facility]

## 2022-10-12 NOTE — Assessment & Plan Note (Addendum)
Concern for exposure. - STD screening - HIV and RPR - Discussed safe sex and will follow-up with results

## 2022-10-13 ENCOUNTER — Other Ambulatory Visit: Payer: Self-pay | Admitting: Family Medicine

## 2022-10-13 ENCOUNTER — Encounter: Payer: Self-pay | Admitting: Family Medicine

## 2022-10-13 DIAGNOSIS — N760 Acute vaginitis: Secondary | ICD-10-CM

## 2022-10-13 MED ORDER — CLINDAMYCIN HCL 300 MG PO CAPS
300.0000 mg | ORAL_CAPSULE | Freq: Two times a day (BID) | ORAL | 0 refills | Status: DC
Start: 2022-10-13 — End: 2022-10-14

## 2022-10-14 ENCOUNTER — Other Ambulatory Visit: Payer: Self-pay | Admitting: Family Medicine

## 2022-10-14 ENCOUNTER — Ambulatory Visit: Admission: RE | Admit: 2022-10-14 | Payer: Medicaid Other | Source: Ambulatory Visit

## 2022-10-14 DIAGNOSIS — Z1231 Encounter for screening mammogram for malignant neoplasm of breast: Secondary | ICD-10-CM | POA: Diagnosis not present

## 2022-10-14 DIAGNOSIS — B9689 Other specified bacterial agents as the cause of diseases classified elsewhere: Secondary | ICD-10-CM

## 2022-10-14 MED ORDER — CLINDAMYCIN HCL 300 MG PO CAPS
300.0000 mg | ORAL_CAPSULE | Freq: Three times a day (TID) | ORAL | 0 refills | Status: AC
Start: 2022-10-14 — End: 2022-10-21

## 2022-10-14 NOTE — Progress Notes (Signed)
Patient requested to increase Clindamycin dose to TID for 7 days due to chronic BV and past experience of Clindamycin BID for 7 days didn't fully take away her symptoms.

## 2022-11-10 ENCOUNTER — Telehealth: Payer: Self-pay

## 2022-11-10 NOTE — Telephone Encounter (Signed)
Patient calls nurse line requesting a GYN referral.   She reports she was referred back in July, however reports has not heard from anyone.   She reports she did her own research and spoke with [facility]. She reports they are taking new patients at this time and reports she should not have to wait long.   They are asking for the referral to be sent to them.   Will forward to referral coordinator.

## 2022-11-11 NOTE — Telephone Encounter (Signed)
Placed in requested workqueue.  Pt informed. Jone Baseman, CMA

## 2022-11-14 ENCOUNTER — Ambulatory Visit (HOSPITAL_COMMUNITY)
Admission: RE | Admit: 2022-11-14 | Discharge: 2022-11-14 | Disposition: A | Discharge status: Home / Self Care | Payer: Medicaid Other | Source: Ambulatory Visit | Attending: Family Medicine | Admitting: Family Medicine

## 2022-11-14 ENCOUNTER — Encounter (HOSPITAL_COMMUNITY): Payer: Self-pay

## 2022-11-14 VITALS — BP 140/92 | HR 98 | Temp 99.1°F | Resp 18 | Ht 62.0 in | Wt 169.1 lb

## 2022-11-14 DIAGNOSIS — Z1152 Encounter for screening for COVID-19: Secondary | ICD-10-CM | POA: Insufficient documentation

## 2022-11-14 DIAGNOSIS — J069 Acute upper respiratory infection, unspecified: Secondary | ICD-10-CM

## 2022-11-14 DIAGNOSIS — J4520 Mild intermittent asthma, uncomplicated: Secondary | ICD-10-CM

## 2022-11-14 DIAGNOSIS — I10 Essential (primary) hypertension: Secondary | ICD-10-CM | POA: Diagnosis not present

## 2022-11-14 MED ORDER — PREDNISONE 20 MG PO TABS: 20.0000 mg | ORAL_TABLET | Freq: Every day | ORAL | 0 refills | Status: AC

## 2022-11-14 MED ORDER — HYDROCOD POLI-CHLORPHE POLI ER 10-8 MG/5ML PO SUER: 5.0000 mL | Freq: Two times a day (BID) | ORAL | 0 refills | Status: AC | PRN

## 2022-11-14 MED ORDER — SYMBICORT 80-4.5 MCG/ACT IN AERO: 2.0000 | INHALATION_SPRAY | Freq: Two times a day (BID) | RESPIRATORY_TRACT | 0 refills | Status: DC

## 2022-11-14 NOTE — ED Provider Notes (Addendum)
[facility]    CSN: [id number] Arrival date & time: 11/14/22  1139      History   Chief Complaint Chief Complaint  Patient presents with   Wheezing   Appointment    Gail Bass is a 47 y.o. female.   No language interpreter was used.  Wheezing No wheezing at the time of this visit.  Cough: The patient presents with cough and congestion x 3 days ago She endorsed associated headache, throat irritation, congestion, runny nose, fatigue, and chest pain with wheezing and a fever of 101 at home. She took home covid test x 2 with one of them positive. Both test kits expired. The positive test expired in 2022 and the other in 2023. Hx of sick contact working at the [employer].  Home treatments tried include Flonase, Zyrtec, and OTC cough medication. Promethazine DM does not help a lot. She also used an expired Tussionex with minimal improvement. She has albuterol at home which helps with her wheezing.   HTN: Compliant with her home meds. Her last dose was this morning. Her home BP checks usually is in the 140s/90. BP goes up when checked in the clinics with automated machine and she requested a repeat with manual machine.  Past Medical History:  Diagnosis Date   Abscess of buttock 08/13/2019   Asthma    Bacterial vaginosis    Boil 06/14/2019   BV (bacterial vaginosis) 09/13/2008        Hypertension 05/11/2013   Kidney stone    OVARIAN CYST, LEFT 10/29/2008   Qualifier: Diagnosis of  By: Lanier Prude  MD, Taineisha     Pain in the chest 06/06/2015   Painful lumpy right breast 03/21/2013   Recurrent cold sores 12/05/2015   Recurrent sinusitis 06/06/2015   Sinus congestion 04/25/2018   TUBAL PREGNANCY 09/13/2008   Qualifier: History of  By: Lanier Prude  MD, Taineisha     Vaginal discharge 01/10/2014   Vaginal discharge 01/10/2014    Patient Active Problem List   Diagnosis Date Noted   Screening for cervical cancer 09/24/2022   Screening for colon cancer 09/24/2022    Screening examination for STD (sexually transmitted disease) 09/24/2022   Hot flashes 05/10/2022   Milia of right eyelid 08/27/2021   Viral illness 02/27/2021   Back pain 02/13/2021   Overactive bladder 07/03/2020   Levator spasm 07/03/2020   Dyspareunia, female 07/03/2020   Mixed stress and urge urinary incontinence 04/11/2020   History of COVID-19 03/13/2020   Seasonal allergies 06/14/2019   PTSD (post-traumatic stress disorder) 12/05/2015   External hemorrhoids 08/07/2015   Vaginal discharge 01/10/2014   Hypertension 05/11/2013   Tobacco use disorder 03/21/2013   ASTHMA, INTERMITTENT 12/23/2009   MIGRAINE HEADACHE 09/13/2008   MENORRHAGIA 09/13/2008   Depression 06/21/2008    Past Surgical History:  Procedure Laterality Date   CYSTECTOMY     CYSTOSCOPY WITH RETROGRADE PYELOGRAM, URETEROSCOPY AND STENT PLACEMENT Left 02/22/2014   Procedure: CYSTOSCOPY WITH RETROGRADE PYELOGRAM, URETEROSCOPY, STONE BASKET EXTRACTION ;  Surgeon: Kathi Ludwig, MD;  Location: [facility];  Service: Urology;  Laterality: Left;   tailbone cyst     TUBAL LIGATION      OB History     Gravida  6   Para  3   Term      Preterm      AB  3   Living  3      SAB  3   IAB      Ectopic  Multiple      Live Births               Home Medications    Prior to Admission medications   Medication Sig Start Date End Date Taking? Authorizing Provider  acyclovir (ZOVIRAX) 400 MG tablet TAKE 1 TABLET(400 MG) BY MOUTH TWICE DAILY 09/08/22  Yes Vonna Drafts, MD  albuterol (VENTOLIN HFA) 108 (90 Base) MCG/ACT inhaler Inhale 2 puffs into the lungs every 4 (four) hours as needed for wheezing or shortness of breath. 04/14/22  Yes Carlisle Beers, FNP  budesonide-formoterol (SYMBICORT) 80-4.5 MCG/ACT inhaler Inhale 2 puffs into the lungs 2 (two) times daily. 11/14/22  Yes Doreene Eland, MD  cetirizine (ZYRTEC) 10 MG tablet TAKE 1 TABLET(10 MG) BY MOUTH DAILY 09/08/22  Yes Vonna Drafts, MD  chlorpheniramine-HYDROcodone (TUSSIONEX) 10-8 MG/5ML Take 5 mLs by mouth every 12 (twelve) hours as needed for up to 7 days for cough. 11/14/22 11/21/22 Yes Doreene Eland, MD  fluticasone (FLONASE) 50 MCG/ACT nasal spray SHAKE LIQUID AND USE 2 SPRAYS IN [facility] NOSTRIL DAILY 04/29/22  Yes Valetta Close, MD  ibuprofen (ADVIL) 600 MG tablet Take 1 tablet (600 mg total) by mouth every 6 (six) hours as needed. 08/13/19  Yes McDonald, Mia A, PA-C  Multiple Vitamins-Minerals (ULTRA WOMENS PACK PO) Take by mouth.   Yes [provider]  NIFEdipine (PROCARDIA-XL/NIFEDICAL-XL) 30 MG 24 hr tablet TAKE 1 TABLET(30 MG) BY MOUTH AT BEDTIME 06/09/22  Yes Valetta Close, MD  predniSONE (DELTASONE) 20 MG tablet Take 1 tablet (20 mg total) by mouth daily for 5 days. 11/14/22 11/19/22 Yes Doreene Eland, MD  hydrocortisone (ANUSOL-HC) 2.5 % rectal cream Place 1 Application rectally 2 (two) times daily. 08/27/21   Valetta Close, MD  hydrocortisone (ANUSOL-HC) 25 MG suppository Place 1 suppository (25 mg total) rectally 2 (two) times daily. 08/27/21   Valetta Close, MD  losartan (COZAAR) 25 MG tablet Take 1 tablet (25 mg total) by mouth at bedtime. 04/28/22   Maury Dus, MD  methocarbamol (ROBAXIN) 500 MG tablet Take 1 tablet (500 mg total) by mouth 2 (two) times daily. 06/17/22   Garrison, [place] N, FNP  montelukast (SINGULAIR) 10 MG tablet Take 1 tablet (10 mg total) by mouth at bedtime. 06/13/19   Shirley, [place], DO  nicotine (NICODERM CQ - DOSED IN MG/24 HOURS) 14 mg/24hr patch Place 1 patch (14 mg total) onto the skin in the morning. 03/20/21   Westley Chandler, MD  ondansetron (ZOFRAN-ODT) 4 MG disintegrating tablet Take 1 tablet (4 mg total) by mouth every 8 (eight) hours as needed for nausea or vomiting. 07/10/22   Garrison, [place] N, FNP  phenylephrine-shark liver oil-mineral oil-petrolatum (PREPARATION H) 0.25-14-74.9 % rectal ointment Place 1 Application rectally 2 (two) times daily  as needed for hemorrhoids. 05/08/22   Valetta Close, MD  shark liver oil-cocoa butter (PREPARATION H) 0.25-88.44 % suppository Place 1 suppository rectally as needed for hemorrhoids. 05/08/22   Valetta Close, MD  tamsulosin (FLOMAX) 0.4 MG CAPS capsule Take 1 capsule (0.4 mg total) by mouth daily. 07/10/22   Garrison, [place] N, FNP    Family History Family History  Problem Relation Age of Onset   Heart Problems Mother    Heart failure Mother    Arrhythmia Father        Pacemakers placed didn't take passed away 3 moths later   Arrhythmia Sister        Pacemaker didn't take  to body passed away 3 months ;later   Hypertension Maternal Aunt    Cervical cancer Maternal Aunt        stage 4 type B   Heart failure Maternal Grandmother    Hypertension Maternal Grandfather    Endometrial cancer Maternal Aunt    Endometriosis Maternal Aunt     Social History Social History   Tobacco Use   Smoking status: Every Day    Current packs/day: 0.50    Average packs/day: 0.5 packs/day for 10.0 years (5.0 ttl pk-yrs)    Types: Cigarettes    Start date: 11/19/2012    Passive exposure: Current   Smokeless tobacco: Never   Tobacco comments:    Reports attempting cessation with patches.   Vaping Use   Vaping status: Never Used  Substance Use Topics   Alcohol use: No   Drug use: Yes    Types: Marijuana     Allergies   Doxycycline   Review of Systems Review of Systems  Respiratory:  Positive for wheezing.   All other systems reviewed and are negative.    Physical Exam Triage Vital Signs ED Triage Vitals [11/14/22 1154]  Encounter Vitals Group     BP      Systolic BP Percentile      Diastolic BP Percentile      Pulse      Resp      Temp      Temp src      SpO2      Weight      Height      Head Circumference      Peak Flow      Pain Score 10     Pain Loc      Pain Education      Exclude from Growth Chart    No data found.  Updated Vital Signs BP (!) 140/92    Pulse 98   Temp 99.1 F (37.3 C) (Oral)   Resp 18   Ht 5\' 2"  (1.575 m)   Wt 76.7 kg   LMP 11/01/2022 (Approximate)   SpO2 93%   BMI 30.93 kg/m   Visual Acuity Right Eye Distance:   Left Eye Distance:   Bilateral Distance:    Right Eye Near:   Left Eye Near:    Bilateral Near:     Physical Exam Vitals and nursing note reviewed.  Constitutional:      General: She is in acute distress.     Appearance: Normal appearance. She is not toxic-appearing.     Comments: O2 Sat low normal  Eyes:     General:        Right eye: No discharge.        Left eye: No discharge.     Extraocular Movements: Extraocular movements intact.     Pupils: Pupils are equal, round, and reactive to light.  Cardiovascular:     Rate and Rhythm: Normal rate and regular rhythm.     Heart sounds: Normal heart sounds. No murmur heard. Pulmonary:     Effort: Pulmonary effort is normal. No respiratory distress.     Breath sounds: Normal breath sounds.  Musculoskeletal:     Cervical back: Neck supple.  Neurological:     Mental Status: She is alert.      UC Treatments / Results  Labs (all labs ordered are listed, but only abnormal results are displayed) Labs Reviewed  SARS CORONAVIRUS 2 (TAT 6-24 HRS)    EKG  Radiology No results found.  Procedures Procedures (including critical care time)  Medications Ordered in UC Medications - No data to display  Initial Impression / Assessment and Plan / UC Course  I have reviewed the triage vital signs and the nursing notes.  Pertinent labs & imaging results that were available during my care of the patient were reviewed by me and considered in my medical decision making (see chart for details).   Final Clinical Impressions(s) / UC Diagnoses   Final diagnoses:  Upper respiratory tract infection, unspecified type  Mild intermittent asthma, unspecified whether complicated    URI Likely viral, potentially COVID 19 infection COVID test obtained  today I will contact her with results and discuss starting Paxlovid As discussed, can't go by the expired test results ED precaution discussed. She agreed with the plan Work note provided till Monday If Covid positive, I will have her return to obtain a new work letter to extend her isolation.  Asthma Aggravated by Viral illness Normal pulm exam with no wheezing She just completed a treatment of albuterol prior to coming in Continue home albuterol Q4 hours prn I started her on oral steroids and Symbicort - both escribed Normal work of breathing at this time, hence no indication for ED visit ED precautions discussed She agreed with the plan   HTN Repeat manual BP check was 140/92 Conitnue home regimen and f/u with PCP for BP management.   Discharge Instructions      It was nice seeing you today. I suspect you might have viral infection. I will call you soon as I have your covid test result and discuss need for Paxlovid then. In the meantime, continue your albuterol inhaler and start daily oral steroids for 5 days and Symbicort daily BID for your asthma. Please see PCP soon for follow up and return to [place] if your symptoms worsens.     ED Prescriptions     Medication Sig Dispense Auth. Provider   predniSONE (DELTASONE) 20 MG tablet Take 1 tablet (20 mg total) by mouth daily for 5 days. 5 tablet Janit Pagan T, MD   budesonide-formoterol (SYMBICORT) 80-4.5 MCG/ACT inhaler Inhale 2 puffs into the lungs 2 (two) times daily. 10.2 g Janit Pagan T, MD   chlorpheniramine-HYDROcodone (TUSSIONEX) 10-8 MG/5ML Take 5 mLs by mouth every 12 (twelve) hours as needed for up to 7 days for cough. 70 mL Doreene Eland, MD      I have reviewed the PDMP during this encounter.   Doreene Eland, MD 11/14/22 1242    Doreene Eland, MD 11/14/22 1249    Doreene Eland, MD 11/14/22 1251    Doreene Eland, MD 11/14/22 1252    Doreene Eland, MD 11/14/22  1253    Doreene Eland, MD 11/14/22 2030

## 2022-11-14 NOTE — Discharge Instructions (Addendum)
It was nice seeing you today. I suspect you might have viral infection. I will call you soon as I have your covid test result and discuss need for Paxlovid then. In the meantime, continue your albuterol inhaler and start daily oral steroids for 5 days and Symbicort daily BID for your asthma. Please see PCP soon for follow up and return to [place] if your symptoms worsens.

## 2022-11-14 NOTE — ED Triage Notes (Signed)
Patient having started with headache, throat irritation, congestion, runny nose, fatigue, and chest pain with wheezing, cough, and fever (101). Took an expired Covid test with one positive and one negative. Patient works at an [employer].  Onset Wednesday.   Patient tried Flonase, zyrtec, promethazine dm, and otc cold meds.

## 2022-11-15 LAB — SARS CORONAVIRUS 2 (TAT 6-24 HRS): SARS Coronavirus 2: NEGATIVE

## 2022-12-23 ENCOUNTER — Encounter (HOSPITAL_COMMUNITY): Payer: Self-pay

## 2022-12-23 ENCOUNTER — Ambulatory Visit (HOSPITAL_COMMUNITY)
Admission: RE | Admit: 2022-12-23 | Discharge: 2022-12-23 | Disposition: A | Discharge status: Home / Self Care | Payer: Medicaid Other | Source: Ambulatory Visit | Attending: Emergency Medicine | Admitting: Emergency Medicine

## 2022-12-23 VITALS — BP 161/99 | HR 99 | Temp 98.3°F | Resp 17

## 2022-12-23 DIAGNOSIS — R051 Acute cough: Secondary | ICD-10-CM

## 2022-12-23 DIAGNOSIS — B9689 Other specified bacterial agents as the cause of diseases classified elsewhere: Secondary | ICD-10-CM

## 2022-12-23 DIAGNOSIS — J019 Acute sinusitis, unspecified: Secondary | ICD-10-CM

## 2022-12-23 MED ORDER — AMOXICILLIN-POT CLAVULANATE 875-125 MG PO TABS: 1.0000 | ORAL_TABLET | Freq: Two times a day (BID) | ORAL | 0 refills | Status: DC

## 2022-12-23 MED ORDER — ALBUTEROL SULFATE HFA 108 (90 BASE) MCG/ACT IN AERS: 2.0000 | INHALATION_SPRAY | Freq: Four times a day (QID) | RESPIRATORY_TRACT | 0 refills | Status: DC | PRN

## 2022-12-23 MED ORDER — PROMETHAZINE-DM 6.25-15 MG/5ML PO SYRP: 5.0000 mL | ORAL_SOLUTION | Freq: Four times a day (QID) | ORAL | 0 refills | Status: DC | PRN

## 2022-12-23 NOTE — Discharge Instructions (Signed)
Please take Augmentin as prescribed. Take with food to avoid upset stomach. The promethazine DM cough syrup can be used up to 4 times daily. If this medication makes you drowsy, take only once before bed. You can use inhaler every 6 hours if needed for shortness of breath, cough, wheezing, etc Continue lots of fluids  Please return if no change in symptoms after 4 full days on the antibiotic

## 2022-12-23 NOTE — ED Provider Notes (Signed)
[facility]    CSN: [id number] Arrival date & time: 12/23/22  1511     History   Chief Complaint Chief Complaint  Patient presents with   Ear Fullness    Was seen a month ago for a URI virus, symtoms never fully got better, still have a cough, post nasal drip, headaches, & now my right ear has muffled sound, hurts to the touch, itchy inside, right side of throat hurts to swallow. a sinus infection - Entered by patient    HPI Gail Bass is a 47 y.o. female.  Seen 1 month ago for viral URI symptoms. Was having cough, congestion, throat irritation, and fever. Treated for virus and asthma exacerbation with Symbicort, prednisone burst, tussionex  Reporting continuing symptoms. She improved slightly but symptoms never fully went away. Today concerns for cough, PND, sinus pressure, right ear fullness and pain, and throat irritation   Past Medical History:  Diagnosis Date   Abscess of buttock 08/13/2019   Asthma    Bacterial vaginosis    Boil 06/14/2019   BV (bacterial vaginosis) 09/13/2008        Hypertension 05/11/2013   Kidney stone    OVARIAN CYST, LEFT 10/29/2008   Qualifier: Diagnosis of  By: Lanier Prude  MD, Taineisha     Pain in the chest 06/06/2015   Painful lumpy right breast 03/21/2013   Recurrent cold sores 12/05/2015   Recurrent sinusitis 06/06/2015   Sinus congestion 04/25/2018   TUBAL PREGNANCY 09/13/2008   Qualifier: History of  By: Lanier Prude  MD, Taineisha     Vaginal discharge 01/10/2014   Vaginal discharge 01/10/2014    Patient Active Problem List   Diagnosis Date Noted   Screening for cervical cancer 09/24/2022   Screening for colon cancer 09/24/2022   Screening examination for STD (sexually transmitted disease) 09/24/2022   Hot flashes 05/10/2022   Milia of right eyelid 08/27/2021   Viral illness 02/27/2021   Back pain 02/13/2021   Overactive bladder 07/03/2020   Levator spasm 07/03/2020   Dyspareunia, female 07/03/2020   Mixed stress and urge  urinary incontinence 04/11/2020   History of COVID-19 03/13/2020   Seasonal allergies 06/14/2019   PTSD (post-traumatic stress disorder) 12/05/2015   External hemorrhoids 08/07/2015   Vaginal discharge 01/10/2014   Hypertension 05/11/2013   Tobacco use disorder 03/21/2013   Asthma 12/23/2009   Migraine headache 09/13/2008   MENORRHAGIA 09/13/2008   Depression 06/21/2008    Past Surgical History:  Procedure Laterality Date   CYSTECTOMY     CYSTOSCOPY WITH RETROGRADE PYELOGRAM, URETEROSCOPY AND STENT PLACEMENT Left 02/22/2014   Procedure: CYSTOSCOPY WITH RETROGRADE PYELOGRAM, URETEROSCOPY, STONE BASKET EXTRACTION ;  Surgeon: Kathi Ludwig, MD;  Location: [facility];  Service: Urology;  Laterality: Left;   tailbone cyst     TUBAL LIGATION      OB History     Gravida  6   Para  3   Term      Preterm      AB  3   Living  3      SAB  3   IAB      Ectopic      Multiple      Live Births               Home Medications    Prior to Admission medications   Medication Sig Start Date End Date Taking? Authorizing Provider  amoxicillin-clavulanate (AUGMENTIN) 875-125 MG tablet Take 1 tablet by mouth every 12 (  twelve) hours for 7 days. 12/23/22 12/30/22 Yes Ladainian Therien, Lurena Joiner, PA-C  promethazine-dextromethorphan (PROMETHAZINE-DM) 6.25-15 MG/5ML syrup Take 5 mLs by mouth 4 (four) times daily as needed for cough. 12/23/22  Yes Beaux Wedemeyer, Lurena Joiner, PA-C  acyclovir (ZOVIRAX) 400 MG tablet TAKE 1 TABLET(400 MG) BY MOUTH TWICE DAILY 09/08/22   Vonna Drafts, MD  albuterol (VENTOLIN HFA) 108 (90 Base) MCG/ACT inhaler Inhale 2 puffs into the lungs every 6 (six) hours as needed. 12/23/22   Rena Hunke, Lurena Joiner, PA-C  cetirizine (ZYRTEC) 10 MG tablet TAKE 1 TABLET(10 MG) BY MOUTH DAILY 09/08/22   Vonna Drafts, MD  fluticasone [facility]) 50 MCG/ACT nasal spray SHAKE LIQUID AND USE 2 SPRAYS IN [facility] NOSTRIL DAILY 04/29/22   Valetta Close, MD  hydrocortisone (ANUSOL-HC) 2.5 % rectal cream  Place 1 Application rectally 2 (two) times daily. 08/27/21   Valetta Close, MD  hydrocortisone (ANUSOL-HC) 25 MG suppository Place 1 suppository (25 mg total) rectally 2 (two) times daily. 08/27/21   Valetta Close, MD  ibuprofen (ADVIL) 600 MG tablet Take 1 tablet (600 mg total) by mouth every 6 (six) hours as needed. 08/13/19   McDonald, Mia A, PA-C  losartan (COZAAR) 25 MG tablet Take 1 tablet (25 mg total) by mouth at bedtime. 04/28/22   Maury Dus, MD  methocarbamol (ROBAXIN) 500 MG tablet Take 1 tablet (500 mg total) by mouth 2 (two) times daily. 06/17/22   Garrison, [place] N, FNP  montelukast (SINGULAIR) 10 MG tablet Take 1 tablet (10 mg total) by mouth at bedtime. 06/13/19   Shirley, [place], DO  Multiple Vitamins-Minerals (ULTRA WOMENS PACK PO) Take by mouth.    [provider]  nicotine (NICODERM CQ - DOSED IN MG/24 HOURS) 14 mg/24hr patch Place 1 patch (14 mg total) onto the skin in the morning. 03/20/21   Westley Chandler, MD  NIFEdipine (PROCARDIA-XL/NIFEDICAL-XL) 30 MG 24 hr tablet TAKE 1 TABLET(30 MG) BY MOUTH AT BEDTIME 06/09/22   Valetta Close, MD  phenylephrine-shark liver oil-mineral oil-petrolatum (PREPARATION H) 0.25-14-74.9 % rectal ointment Place 1 Application rectally 2 (two) times daily as needed for hemorrhoids. 05/08/22   Valetta Close, MD  shark liver oil-cocoa butter (PREPARATION H) 0.25-88.44 % suppository Place 1 suppository rectally as needed for hemorrhoids. 05/08/22   Valetta Close, MD  tamsulosin (FLOMAX) 0.4 MG CAPS capsule Take 1 capsule (0.4 mg total) by mouth daily. 07/10/22   Garrison, [place] N, FNP    Family History Family History  Problem Relation Age of Onset   Heart Problems Mother    Heart failure Mother    Arrhythmia Father        Pacemakers placed didn't take passed away 3 moths later   Arrhythmia Sister        Pacemaker didn't take to body passed away 3 months ;later   Hypertension Maternal Aunt    Cervical cancer Maternal  Aunt        stage 4 type B   Heart failure Maternal Grandmother    Hypertension Maternal Grandfather    Endometrial cancer Maternal Aunt    Endometriosis Maternal Aunt     Social History Social History   Tobacco Use   Smoking status: Every Day    Current packs/day: 0.50    Average packs/day: 0.5 packs/day for 10.1 years (5.0 ttl pk-yrs)    Types: Cigarettes    Start date: 11/19/2012    Passive exposure: Current   Smokeless tobacco: Never   Tobacco comments:    Reports attempting  cessation with patches.   Vaping Use   Vaping status: Never Used  Substance Use Topics   Alcohol use: No   Drug use: Yes    Types: Marijuana     Allergies   Doxycycline   Review of Systems Review of Systems As per HPI  Physical Exam Triage Vital Signs ED Triage Vitals  Encounter Vitals Group     BP 12/23/22 1545 (!) 161/99     Systolic BP Percentile --      Diastolic BP Percentile --      Pulse Rate 12/23/22 1545 99     Resp 12/23/22 1545 17     Temp 12/23/22 1545 98.3 F (36.8 C)     Temp Source 12/23/22 1545 Oral     SpO2 12/23/22 1545 96 %     Weight --      Height --      Head Circumference --      Peak Flow --      Pain Score 12/23/22 1546 7     Pain Loc --      Pain Education --      Exclude from Growth Chart --    No data found.  Updated Vital Signs BP (!) 161/99 (BP Location: Right Arm)   Pulse 99   Temp 98.3 F (36.8 C) (Oral)   Resp 17   LMP 11/21/2022   SpO2 96%    Physical Exam Vitals and nursing note reviewed.  Constitutional:      General: She is not in acute distress.    Appearance: She is not diaphoretic.  HENT:     Right Ear: Tympanic membrane and ear canal normal.     Left Ear: Tympanic membrane and ear canal normal.     Nose: Congestion present. No rhinorrhea.     Comments: Sinus tenderness    Mouth/Throat:     Pharynx: Oropharynx is clear.  Cardiovascular:     Rate and Rhythm: Normal rate and regular rhythm.     Heart sounds: Normal heart  sounds.  Pulmonary:     Effort: Pulmonary effort is normal.     Breath sounds: Normal breath sounds.     Comments: Clear lungs, unlabored breathing  Musculoskeletal:     Cervical back: Normal range of motion.  Lymphadenopathy:     Cervical: No cervical adenopathy.  Skin:    General: Skin is warm and dry.  Neurological:     Mental Status: She is alert and oriented to person, place, and time.     UC Treatments / Results  Labs (all labs ordered are listed, but only abnormal results are displayed) Labs Reviewed - No data to display  EKG  Radiology No results found.  Procedures Procedures   Medications Ordered in UC Medications - No data to display  Initial Impression / Assessment and Plan / UC Course  I have reviewed the triage vital signs and the nursing notes.  Pertinent labs & imaging results that were available during my care of the patient were reviewed by me and considered in my medical decision making (see chart for details).  Currently afebrile  With duration of symptoms, worsening of congestion and sinus pressure, treat for bacterial infection with augmentin BID x 7 days. Promethazine DM for cough. Refilled albuterol inhaler per patient request Advised return and ED precautions. Patient agrees to plan, no questions at this time   Final Clinical Impressions(s) / UC Diagnoses   Final diagnoses:  Acute bacterial sinusitis  Acute  cough     Discharge Instructions      Please take Augmentin as prescribed. Take with food to avoid upset stomach. The promethazine DM cough syrup can be used up to 4 times daily. If this medication makes you drowsy, take only once before bed. You can use inhaler every 6 hours if needed for shortness of breath, cough, wheezing, etc Continue lots of fluids  Please return if no change in symptoms after 4 full days on the antibiotic      ED Prescriptions     Medication Sig Dispense Auth. Provider   albuterol (VENTOLIN HFA) 108  (90 Base) MCG/ACT inhaler Inhale 2 puffs into the lungs every 6 (six) hours as needed. 18 g Ona Roehrs, PA-C   promethazine-dextromethorphan (PROMETHAZINE-DM) 6.25-15 MG/5ML syrup Take 5 mLs by mouth 4 (four) times daily as needed for cough. 240 mL Mitsy Owen, PA-C   amoxicillin-clavulanate (AUGMENTIN) 875-125 MG tablet Take 1 tablet by mouth every 12 (twelve) hours for 7 days. 14 tablet Syreeta Figler, Lurena Joiner, PA-C      PDMP not reviewed this encounter.   Victoriano Campion, Lurena Joiner, [place] 12/23/22 1705

## 2022-12-23 NOTE — ED Triage Notes (Signed)
Pt as seen a month ago for a URI states symtoms never fully got better. Today c/o still having a cough, post nasal drip, headaches, right ear fullness and pain, sore throat on right side.  Pt states she can feel some bumps on right side of mouth.

## 2022-12-30 ENCOUNTER — Ambulatory Visit (INDEPENDENT_AMBULATORY_CARE_PROVIDER_SITE_OTHER): Payer: Medicaid Other | Admitting: Obstetrics and Gynecology

## 2022-12-30 ENCOUNTER — Encounter: Payer: Self-pay | Admitting: Obstetrics and Gynecology

## 2022-12-30 VITALS — BP 134/82 | HR 95 | Ht 62.0 in | Wt 163.0 lb

## 2022-12-30 DIAGNOSIS — N898 Other specified noninflammatory disorders of vagina: Secondary | ICD-10-CM

## 2022-12-30 DIAGNOSIS — N941 Unspecified dyspareunia: Secondary | ICD-10-CM | POA: Diagnosis not present

## 2022-12-30 DIAGNOSIS — R61 Generalized hyperhidrosis: Secondary | ICD-10-CM | POA: Diagnosis not present

## 2022-12-30 DIAGNOSIS — N939 Abnormal uterine and vaginal bleeding, unspecified: Secondary | ICD-10-CM | POA: Diagnosis not present

## 2022-12-30 DIAGNOSIS — I10 Essential (primary) hypertension: Secondary | ICD-10-CM | POA: Diagnosis not present

## 2022-12-30 DIAGNOSIS — Z8759 Personal history of other complications of pregnancy, childbirth and the puerperium: Secondary | ICD-10-CM | POA: Insufficient documentation

## 2022-12-30 LAB — WET PREP FOR TRICH, YEAST, CLUE

## 2022-12-30 LAB — PREGNANCY, URINE: Preg Test, Ur: NEGATIVE

## 2022-12-30 NOTE — Progress Notes (Signed)
47 y.o. [id number] female s/p BTL due to prior ectopic pregnancy x3 (2007-2008) with HTN, tobacco use, mixed incontinence, OAB here for referral for menorrhagia. Relationship.  Patient's last menstrual period was 12/25/2022. Period Duration (Days): 7 Menstrual Flow: Heavy Menstrual Control: Maxi pad Menstrual Control Change Freq (Hours): 2  She states that she has severe sharp pain with intercourse. She says that stimulation is not painful but penetration is painful. Missionary is comfortable, but other positions are not. Pain developed ~3 years ago and has gotten worse.  CD1-3 are heavy, patient reports changing pad ~hourly on these days.  Normal CBC March 2024 PAP 09/24/22 ASCUS, HPV neg 10/12/22 TVUS: Normal, ET 12mm.  Birth control: BTL Last mammogram: 10/16/22 BIRADS 1, density b Sexually active: yes with female partner x7 years Smoker: yes, 1/2 ppd  GYN HISTORY: s/p BTL due to prior ectopic pregnancy x3 (2007-2008) Hx of PID Hx of chlamydia  OB History  Gravida Para Term Preterm AB Living  6 3     3 3   SAB IAB Ectopic Multiple Live Births  3            # Outcome Date GA Lbr Len/2nd Weight Sex Type Anes PTL Lv  6 Para           5 Para           4 Para           3 SAB           2 SAB           1 SAB             Past Medical History:  Diagnosis Date   Abscess of buttock 08/13/2019   Asthma    Bacterial vaginosis    Boil 06/14/2019   BV (bacterial vaginosis) 09/13/2008        Hypertension 05/11/2013   Kidney stone    OVARIAN CYST, LEFT 10/29/2008   Qualifier: Diagnosis of  By: Lanier Prude  MD, Taineisha     Pain in the chest 06/06/2015   Painful lumpy right breast 03/21/2013   Recurrent cold sores 12/05/2015   Recurrent sinusitis 06/06/2015   Sinus congestion 04/25/2018   TUBAL PREGNANCY 09/13/2008   Qualifier: History of  By: Lanier Prude  MD, Taineisha     Vaginal discharge 01/10/2014   Vaginal discharge 01/10/2014    Past Surgical History:  Procedure Laterality Date    CYSTECTOMY     CYSTOSCOPY WITH RETROGRADE PYELOGRAM, URETEROSCOPY AND STENT PLACEMENT Left 02/22/2014   Procedure: CYSTOSCOPY WITH RETROGRADE PYELOGRAM, URETEROSCOPY, STONE BASKET EXTRACTION ;  Surgeon: Kathi Ludwig, MD;  Location: [facility];  Service: Urology;  Laterality: Left;   tailbone cyst     TUBAL LIGATION      Current Outpatient Medications on File Prior to Visit  Medication Sig Dispense Refill   acyclovir (ZOVIRAX) 400 MG tablet TAKE 1 TABLET(400 MG) BY MOUTH TWICE DAILY 60 tablet 2   albuterol (VENTOLIN HFA) 108 (90 Base) MCG/ACT inhaler Inhale 2 puffs into the lungs every 6 (six) hours as needed. 18 g 0   cetirizine (ZYRTEC) 10 MG tablet TAKE 1 TABLET(10 MG) BY MOUTH DAILY 90 tablet 1   fluticasone (FLONASE) 50 MCG/ACT nasal spray SHAKE LIQUID AND USE 2 SPRAYS IN EACH NOSTRIL DAILY 16 g 12   hydrocortisone (ANUSOL-HC) 2.5 % rectal cream Place 1 Application rectally 2 (two) times daily. 30 g 1   hydrocortisone (ANUSOL-HC) 25 MG suppository Place 1  suppository (25 mg total) rectally 2 (two) times daily. 12 suppository 0   ibuprofen (ADVIL) 600 MG tablet Take 1 tablet (600 mg total) by mouth every 6 (six) hours as needed. 30 tablet 0   Multiple Vitamins-Minerals (ULTRA WOMENS PACK PO) Take by mouth.     NIFEdipine (PROCARDIA-XL/NIFEDICAL-XL) 30 MG 24 hr tablet TAKE 1 TABLET(30 MG) BY MOUTH AT BEDTIME 90 tablet 3   phenylephrine-shark liver oil-mineral oil-petrolatum (PREPARATION H) 0.25-14-74.9 % rectal ointment Place 1 Application rectally 2 (two) times daily as needed for hemorrhoids. 56 g 1   promethazine-dextromethorphan (PROMETHAZINE-DM) 6.25-15 MG/5ML syrup Take 5 mLs by mouth 4 (four) times daily as needed for cough. (Patient not taking: Reported on 12/30/2022) 240 mL 0   No current facility-administered medications on file prior to visit.    Allergies  Allergen Reactions   Doxycycline     REACTION: Nausea      PE Today's Vitals   12/30/22 1359  BP: 134/82   Pulse: 95  SpO2: 100%  Weight: 163 lb (73.9 kg)  Height: 5\' 2"  (1.575 m)   Body mass index is 29.81 kg/m.  Physical Exam Vitals reviewed. Exam conducted with a chaperone present.  Constitutional:      General: She is not in acute distress.    Appearance: Normal appearance.  HENT:     Head: Normocephalic and atraumatic.     Nose: Nose normal.  Eyes:     Extraocular Movements: Extraocular movements intact.     Conjunctiva/sclera: Conjunctivae normal.  Pulmonary:     Effort: Pulmonary effort is normal.  Genitourinary:    General: Normal vulva.     Exam position: Lithotomy position.     Vagina: Normal. No vaginal discharge.     Cervix: Normal. No cervical motion tenderness, discharge or lesion.     Uterus: Normal. Not enlarged and not tender.      Adnexa: Right adnexa normal and left adnexa normal.     Comments: Negative q-tip test, mild TTP along right pelvic floor Musculoskeletal:        General: Normal range of motion.     Cervical back: Normal range of motion.  Neurological:     General: No focal deficit present.     Mental Status: She is alert.  Psychiatric:        Mood and Affect: Mood normal.        Behavior: Behavior normal.      Assessment and Plan:        Abnormal uterine bleeding (AUB) Assessment & Plan: Recent STD test normal TVUS also normal CBC wnl 2024 Will check hormonal labs Recommend EMB, patient is in agreement Plan for management discussion at next appoinment  Orders: -     Thyroid Panel With TSH -     FSH/LH -     Prolactin -     Pregnancy, urine -     Testos,Total,Free and SHBG (Female) -     Endometrial biopsy; Future -     CBC  Vaginal discharge -     WET PREP FOR TRICH, YEAST, CLUE  Dyspareunia, female Assessment & Plan: Mild TTP on exam Encouraged vaginal massage and warm bath prior to intercourse Also discussed importance of stretching and physical activity. Desires PFPT referral, was previously referral for mixed urinary  incontinence, however she did not go. Continue using lubricants. No indication for PV estrogen on exam. Wet mount negative today  Orders: -     Ambulatory referral to Physical Therapy  Rosalyn Gess, MD

## 2022-12-30 NOTE — Assessment & Plan Note (Signed)
Mild TTP on exam Encouraged vaginal massage and warm bath prior to intercourse Also discussed importance of stretching and physical activity. Desires PFPT referral, was previously referral for mixed urinary incontinence, however she did not go. Continue using lubricants. No indication for PV estrogen on exam. Wet mount negative today

## 2022-12-30 NOTE — Assessment & Plan Note (Addendum)
Recent STD test normal TVUS also normal CBC wnl 2024 Will check hormonal labs Recommend EMB, patient is in agreement Plan for management discussion at next appoinment

## 2023-01-04 LAB — FSH/LH
FSH: 16.2 m[IU]/mL
LH: 11 m[IU]/mL

## 2023-01-04 LAB — PROLACTIN: Prolactin: 8.7 ng/mL

## 2023-01-04 LAB — THYROID PANEL WITH TSH
Free Thyroxine Index: 1.9 (ref 1.4–3.8)
T3 Uptake: 26 % (ref 22–35)
T4, Total: 7.4 ug/dL (ref 5.1–11.9)
TSH: 1.15 m[IU]/L

## 2023-01-04 LAB — TESTOS,TOTAL,FREE AND SHBG (FEMALE)
Free Testosterone: 2.5 pg/mL (ref 0.1–6.4)
Sex Hormone Binding: 23.7 nmol/L (ref 17–124)
Testosterone, Total, LC-MS-MS: 15 ng/dL (ref 2–45)

## 2023-01-13 ENCOUNTER — Encounter: Payer: Self-pay | Admitting: Obstetrics and Gynecology

## 2023-01-13 ENCOUNTER — Other Ambulatory Visit (HOSPITAL_COMMUNITY)
Admission: RE | Admit: 2023-01-13 | Discharge: 2023-01-13 | Disposition: A | Discharge status: Home / Self Care | Payer: Medicaid Other | Source: Ambulatory Visit | Attending: Obstetrics and Gynecology | Admitting: Obstetrics and Gynecology

## 2023-01-13 ENCOUNTER — Ambulatory Visit (INDEPENDENT_AMBULATORY_CARE_PROVIDER_SITE_OTHER): Payer: Medicaid Other | Admitting: Obstetrics and Gynecology

## 2023-01-13 VITALS — BP 130/78 | HR 81 | Wt 165.0 lb

## 2023-01-13 DIAGNOSIS — Z113 Encounter for screening for infections with a predominantly sexual mode of transmission: Secondary | ICD-10-CM | POA: Diagnosis not present

## 2023-01-13 DIAGNOSIS — R102 Pelvic and perineal pain: Secondary | ICD-10-CM | POA: Diagnosis not present

## 2023-01-13 DIAGNOSIS — F17200 Nicotine dependence, unspecified, uncomplicated: Secondary | ICD-10-CM | POA: Diagnosis not present

## 2023-01-13 DIAGNOSIS — N939 Abnormal uterine and vaginal bleeding, unspecified: Secondary | ICD-10-CM | POA: Diagnosis not present

## 2023-01-13 DIAGNOSIS — N941 Unspecified dyspareunia: Secondary | ICD-10-CM | POA: Diagnosis not present

## 2023-01-13 DIAGNOSIS — G8929 Other chronic pain: Secondary | ICD-10-CM | POA: Insufficient documentation

## 2023-01-13 DIAGNOSIS — N898 Other specified noninflammatory disorders of vagina: Secondary | ICD-10-CM | POA: Diagnosis not present

## 2023-01-13 DIAGNOSIS — N760 Acute vaginitis: Secondary | ICD-10-CM

## 2023-01-13 MED ORDER — NORETHINDRONE 0.35 MG PO TABS
1.0000 | ORAL_TABLET | Freq: Every day | ORAL | 11 refills | Status: DC
Start: 2023-01-13 — End: 2023-12-13

## 2023-01-13 MED ORDER — LIDOCAINE HCL (PF) 1 % IJ SOLN
10.0000 mL | Freq: Once | INTRAMUSCULAR | Status: DC
Start: 2023-01-13 — End: 2023-01-20

## 2023-01-13 MED ORDER — ONDANSETRON HCL 4 MG PO TABS: 4.0000 mg | ORAL_TABLET | Freq: Three times a day (TID) | ORAL | 1 refills | Status: DC | PRN

## 2023-01-13 NOTE — Assessment & Plan Note (Addendum)
Encouraged cessation Reviewed relationship to chronic pelvic pain

## 2023-01-13 NOTE — Assessment & Plan Note (Signed)
Will call PFPT to schedule this week

## 2023-01-13 NOTE — Progress Notes (Signed)
47 y.o. [id number] female s/p BTL due to prior ectopic pregnancy x3 (2007-2008) with dyspareunia, menorrhagia, mixed incontinence, OAB, HTN, tobacco use here for EMB. Relationship x7 years. Changed partners recently.  Patient's last menstrual period was 12/25/2022.    She states that she has severe sharp pain with intercourse. She says that stimulation is not painful but penetration is painful. Missionary is comfortable, but other positions are not. Pain developed ~3 years ago and has gotten worse.   CD1-3 are heavy, patient reports changing pad ~hourly on these days.  Notes daily RLQ pain rated 6-8/10, sometimes with nausea, worse with intercourse. Pain present for almost 3 years  Also noting vaginal odor today. Desires STD testing   Normal CBC March 2024 10/12/22 TVUS: Normal, ET 12mm.   Birth control: BTL Last mammogram: 10/16/22 BIRADS 1, density b Sexually active: yes with female partner x7 years, however taking a break from prior relationship, SA with new partner. Smoker: yes, 1/2 ppd  Last PAP:    Component Value Date/Time   DIAGPAP (A) 09/24/2022 1340    - Atypical squamous cells of undetermined significance (ASC-US)   DIAGPAP  10/01/2017 0000    NEGATIVE FOR INTRAEPITHELIAL LESIONS OR MALIGNANCY.   HPVHIGH Negative 09/24/2022 1340   ADEQPAP  09/24/2022 1340    Satisfactory for evaluation; transformation zone component PRESENT.   ADEQPAP  10/01/2017 0000    Satisfactory for evaluation  endocervical/transformation zone component PRESENT.    GYN HISTORY: s/p BTL due to prior ectopic pregnancy x3 (2007-2008) Hx of PID Hx of chlamydia  OB History  Gravida Para Term Preterm AB Living  6 3     3 3   SAB IAB Ectopic Multiple Live Births  3            # Outcome Date GA Lbr Len/2nd Weight Sex Type Anes PTL Lv  6 Para           5 Para           4 Para           3 SAB           2 SAB           1 SAB             Past Medical History:  Diagnosis Date   Abscess of  buttock 08/13/2019   Asthma    Bacterial vaginosis    Boil 06/14/2019   BV (bacterial vaginosis) 09/13/2008        Hypertension 05/11/2013   Kidney stone    OVARIAN CYST, LEFT 10/29/2008   Qualifier: Diagnosis of  By: Lanier Prude  MD, Taineisha     Pain in the chest 06/06/2015   Painful lumpy right breast 03/21/2013   Recurrent cold sores 12/05/2015   Recurrent sinusitis 06/06/2015   Sinus congestion 04/25/2018   TUBAL PREGNANCY 09/13/2008   Qualifier: History of  By: Lanier Prude  MD, Taineisha     Vaginal discharge 01/10/2014   Vaginal discharge 01/10/2014    Past Surgical History:  Procedure Laterality Date   CYSTECTOMY     CYSTOSCOPY WITH RETROGRADE PYELOGRAM, URETEROSCOPY AND STENT PLACEMENT Left 02/22/2014   Procedure: CYSTOSCOPY WITH RETROGRADE PYELOGRAM, URETEROSCOPY, STONE BASKET EXTRACTION ;  Surgeon: Kathi Ludwig, MD;  Location: [facility];  Service: Urology;  Laterality: Left;   tailbone cyst     TUBAL LIGATION      Current Outpatient Medications on File Prior to Visit  Medication Sig Dispense Refill  acyclovir (ZOVIRAX) 400 MG tablet TAKE 1 TABLET(400 MG) BY MOUTH TWICE DAILY 60 tablet 2   albuterol (VENTOLIN HFA) 108 (90 Base) MCG/ACT inhaler Inhale 2 puffs into the lungs every 6 (six) hours as needed. 18 g 0   cetirizine (ZYRTEC) 10 MG tablet TAKE 1 TABLET(10 MG) BY MOUTH DAILY 90 tablet 1   fluticasone (FLONASE) 50 MCG/ACT nasal spray SHAKE LIQUID AND USE 2 SPRAYS IN EACH NOSTRIL DAILY 16 g 12   hydrocortisone (ANUSOL-HC) 2.5 % rectal cream Place 1 Application rectally 2 (two) times daily. 30 g 1   hydrocortisone (ANUSOL-HC) 25 MG suppository Place 1 suppository (25 mg total) rectally 2 (two) times daily. 12 suppository 0   ibuprofen (ADVIL) 600 MG tablet Take 1 tablet (600 mg total) by mouth every 6 (six) hours as needed. 30 tablet 0   Multiple Vitamins-Minerals (ULTRA WOMENS PACK PO) Take by mouth.     NIFEdipine (PROCARDIA-XL/NIFEDICAL-XL) 30 MG 24 hr tablet TAKE 1 TABLET(30  MG) BY MOUTH AT BEDTIME 90 tablet 3   phenylephrine-shark liver oil-mineral oil-petrolatum (PREPARATION H) 0.25-14-74.9 % rectal ointment Place 1 Application rectally 2 (two) times daily as needed for hemorrhoids. 56 g 1   No current facility-administered medications on file prior to visit.    Allergies  Allergen Reactions   Doxycycline     REACTION: Nausea      PE Today's Vitals   01/13/23 1412  BP: 130/78  Pulse: 81  SpO2: 98%  Weight: 165 lb (74.8 kg)   Body mass index is 30.18 kg/m.  Physical Exam Vitals reviewed. Exam conducted with a chaperone present.  Constitutional:      General: She is not in acute distress.    Appearance: Normal appearance.  HENT:     Head: Normocephalic and atraumatic.     Nose: Nose normal.  Eyes:     Extraocular Movements: Extraocular movements intact.     Conjunctiva/sclera: Conjunctivae normal.  Pulmonary:     Effort: Pulmonary effort is normal.  Abdominal:     General: There is no distension.     Palpations: Abdomen is soft.     Tenderness: There is abdominal tenderness (mild TTP in RLQ). There is no guarding or rebound.  Genitourinary:    General: Normal vulva.     Exam position: Lithotomy position.     Vagina: Normal. No vaginal discharge.     Cervix: Normal. No cervical motion tenderness, discharge or lesion.     Uterus: Normal. Not enlarged and not tender.      Adnexa: Left adnexa normal.       Right: Tenderness present.      Comments: Mild adnexal tenderness Musculoskeletal:        General: Normal range of motion.     Cervical back: Normal range of motion.  Neurological:     General: No focal deficit present.     Mental Status: She is alert.  Psychiatric:        Mood and Affect: Mood normal.        Behavior: Behavior normal.     Procedure Speculum inserted into the vagina, cervix visualized and was prepped with Betadine.  Cervical block was performed with 10cc 1% lidocaine (Lot#: [id number], Exp 10/2024). The 3 mm  pipelle was introduced into the endometrial cavity without difficulty to a depth of 9cm, suction initiated and a moderate amount of tissue was obtained over 1 pass and sent to pathology.  The instruments were removed from the patient's vagina.  Minimal  bleeding from the cervix was noted.  The patient tolerated the procedure well.      Assessment and Plan:        Abnormal uterine bleeding (AUB) Assessment & Plan: Recent STD test normal TVUS also normal CBC wnl 2024, and hormonal labs normal Uncomplicated EMB today Recommend progestin only hormonal management given HTN and CPP and dyspareunia symptoms. Side effects reviewed Recommend 3 month trial, RTO for reassessment at this time  Orders: -     Lidocaine HCl (PF) -     Surgical pathology -     CBC -     Norethindrone; Take 1 tablet (0.35 mg total) by mouth daily.  Dispense: 28 tablet; Refill: 11  Screen for STD (sexually transmitted disease) -     Hepatitis B surface antigen -     Hepatitis C antibody -     RPR -     HIV Antibody (routine testing w rflx) -     SureSwab Advanced Vaginitis Plus,TMA  Vaginal discharge -     SureSwab Mycoplasma/Ureaplasma, PCR  Chronic pelvic pain in female Assessment & Plan: Notes RLQ pain rated 6-8/10 almost daily, sometimes with nausea x3 years Worse with intercourse Trial of POP Zofran prescribed RTO in 3 months  Orders: -     Norethindrone; Take 1 tablet (0.35 mg total) by mouth daily.  Dispense: 28 tablet; Refill: 11 -     Ondansetron HCl; Take 1 tablet (4 mg total) by mouth every 8 (eight) hours as needed for nausea or vomiting.  Dispense: 30 tablet; Refill: 1  Dyspareunia, female Assessment & Plan: Will call PFPT to schedule this week   Tobacco use disorder Assessment & Plan: Encouraged cessation Reviewed relationship to chronic pelvic pain       Rosalyn Gess, MD

## 2023-01-13 NOTE — Assessment & Plan Note (Addendum)
Notes RLQ pain rated 6-8/10 almost daily, sometimes with nausea x3 years Worse with intercourse Trial of POP Zofran prescribed RTO in 3 months

## 2023-01-13 NOTE — Assessment & Plan Note (Addendum)
Recent STD test normal TVUS also normal CBC wnl 2024, and hormonal labs normal Uncomplicated EMB today Recommend progestin only hormonal management given HTN and CPP and dyspareunia symptoms. Side effects reviewed Recommend 3 month trial, RTO for reassessment at this time

## 2023-01-14 LAB — SURESWAB® ADVANCED VAGINITIS PLUS,TMA
C. trachomatis RNA, TMA: NOT DETECTED
CANDIDA SPECIES: NOT DETECTED
Candida glabrata: NOT DETECTED
N. gonorrhoeae RNA, TMA: NOT DETECTED
SURESWAB(R) ADV BACTERIAL VAGINOSIS(BV),TMA: POSITIVE — AB
TRICHOMONAS VAGINALIS (TV),TMA: NOT DETECTED

## 2023-01-14 LAB — CBC
HCT: 43.8 % (ref 35.0–45.0)
Hemoglobin: 15 g/dL (ref 11.7–15.5)
MCH: 32.6 pg (ref 27.0–33.0)
MCHC: 34.2 g/dL (ref 32.0–36.0)
MCV: 95.2 fL (ref 80.0–100.0)
MPV: 9.6 fL (ref 7.5–12.5)
Platelets: 288 10*3/uL (ref 140–400)
RBC: 4.6 10*6/uL (ref 3.80–5.10)
RDW: 12.6 % (ref 11.0–15.0)
WBC: 9.2 10*3/uL (ref 3.8–10.8)

## 2023-01-14 LAB — HIV ANTIBODY (ROUTINE TESTING W REFLEX): HIV 1&2 Ab, 4th Generation: NONREACTIVE

## 2023-01-14 LAB — HEPATITIS C ANTIBODY: Hepatitis C Ab: NONREACTIVE

## 2023-01-14 LAB — HEPATITIS B SURFACE ANTIGEN: Hepatitis B Surface Ag: NONREACTIVE

## 2023-01-14 LAB — RPR: RPR Ser Ql: NONREACTIVE

## 2023-01-15 ENCOUNTER — Other Ambulatory Visit: Payer: Self-pay | Admitting: Obstetrics and Gynecology

## 2023-01-15 ENCOUNTER — Telehealth: Payer: Self-pay

## 2023-01-15 ENCOUNTER — Encounter: Payer: Self-pay | Admitting: Obstetrics and Gynecology

## 2023-01-15 DIAGNOSIS — N760 Acute vaginitis: Secondary | ICD-10-CM

## 2023-01-15 LAB — SURGICAL PATHOLOGY

## 2023-01-15 MED ORDER — BORIC ACID VAGINAL 600 MG VA SUPP
1.0000 | Freq: Every evening | VAGINAL | 0 refills | Status: AC
Start: 2023-01-15 — End: 2023-02-14

## 2023-01-15 MED ORDER — CLINDAMYCIN HCL 300 MG PO CAPS: 300.0000 mg | ORAL_CAPSULE | Freq: Two times a day (BID) | ORAL | 0 refills | Status: AC

## 2023-01-15 MED ORDER — METRONIDAZOLE 500 MG PO TABS
500.0000 mg | ORAL_TABLET | Freq: Two times a day (BID) | ORAL | 0 refills | Status: DC
Start: 2023-01-15 — End: 2023-01-15

## 2023-01-15 NOTE — Addendum Note (Signed)
Addended by: Darrell Jewel V on: 01/15/2023 10:54 AM   Modules accepted: Orders

## 2023-01-15 NOTE — Telephone Encounter (Signed)
Pt inquiring as to why on her medication bottle for her POPs that it says to limit her time in direct sunlight.  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 So, doesn't want to take something if this is a concern.  Pt advised that a side effect of the pill can be increased sensitivity to sunlight. So, if has to be in the sun, advised to make sure that she maintains coverage of sunscreen. Pt reported that she usually does.   Please advise if anything more. Thanks.

## 2023-01-17 IMAGING — DX DG CHEST 2V
2 series · 2 of 2 positions shown · non-contrast
Comparison: 04/11/2021

CLINICAL DATA: Cough.  Shortness of breath.  Wheezing.

EXAM:
CHEST - 2 VIEW

[chest pa]
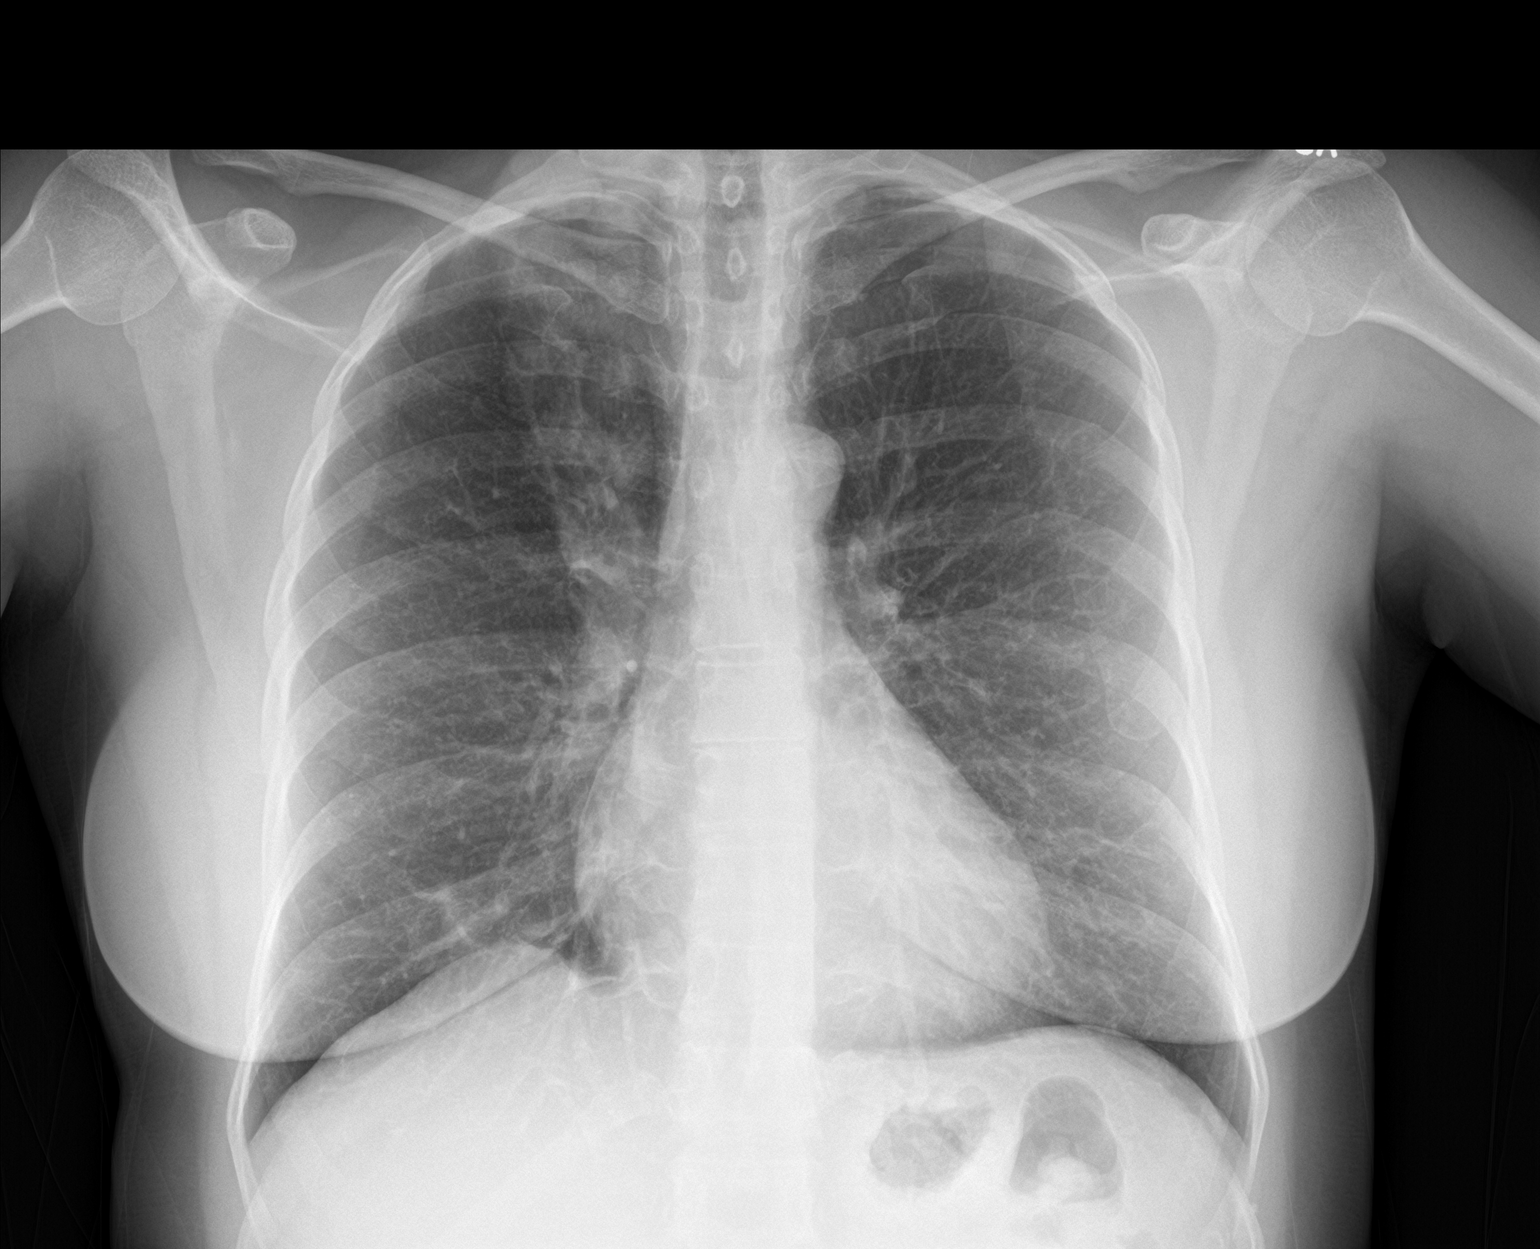

[chest lat]
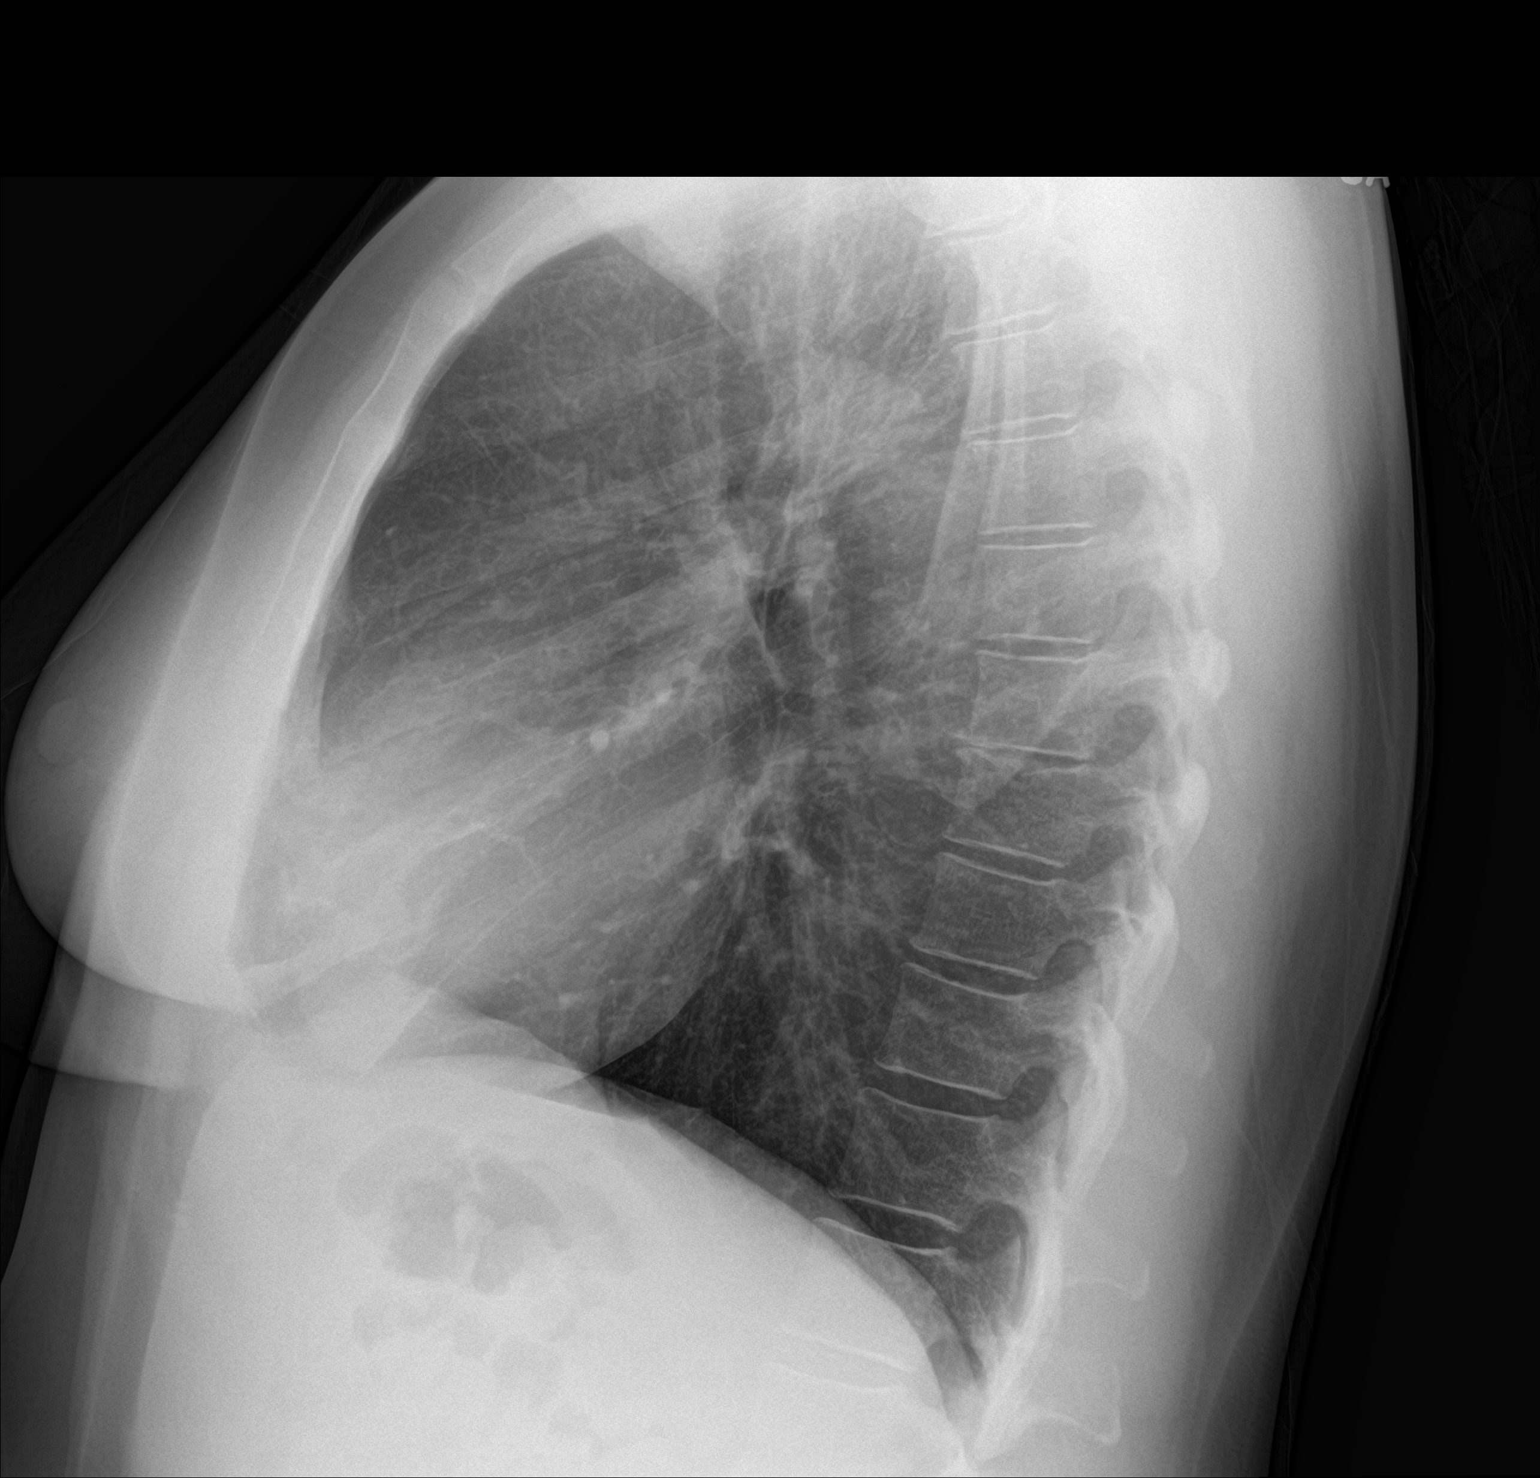

[2 of 2 positions shown; findings below may reference images not displayed]

FINDINGS: The heart size and mediastinal contours are within normal limits.
Both lungs are clear. The visualized skeletal structures are
unremarkable.
IMPRESSION: No active cardiopulmonary disease.

## 2023-01-18 LAB — SURESWAB MYCOPLASMA/UREAPLASMA, PCR
M. hominis DNA: DETECTED — AB
MYCOPLASMA GENITALIUM, rRNA,TMA: NOT DETECTED
U. parvum DNA: NOT DETECTED
U. urealyticum DNA: NOT DETECTED

## 2023-01-18 NOTE — Telephone Encounter (Signed)
Per GH: "Nothing further."

## 2023-01-20 ENCOUNTER — Ambulatory Visit (INDEPENDENT_AMBULATORY_CARE_PROVIDER_SITE_OTHER): Payer: Medicaid Other | Admitting: Obstetrics and Gynecology

## 2023-01-20 ENCOUNTER — Encounter: Payer: Self-pay | Admitting: Obstetrics and Gynecology

## 2023-01-20 VITALS — BP 118/80 | HR 105 | Temp 99.3°F | Wt 163.0 lb

## 2023-01-20 DIAGNOSIS — R102 Pelvic and perineal pain: Secondary | ICD-10-CM

## 2023-01-20 DIAGNOSIS — N760 Acute vaginitis: Secondary | ICD-10-CM

## 2023-01-20 DIAGNOSIS — N939 Abnormal uterine and vaginal bleeding, unspecified: Secondary | ICD-10-CM | POA: Diagnosis not present

## 2023-01-20 DIAGNOSIS — N3281 Overactive bladder: Secondary | ICD-10-CM

## 2023-01-20 DIAGNOSIS — G8929 Other chronic pain: Secondary | ICD-10-CM | POA: Diagnosis not present

## 2023-01-20 DIAGNOSIS — Z87442 Personal history of urinary calculi: Secondary | ICD-10-CM | POA: Diagnosis not present

## 2023-01-20 DIAGNOSIS — N941 Unspecified dyspareunia: Secondary | ICD-10-CM | POA: Diagnosis not present

## 2023-01-20 MED ORDER — DOXYCYCLINE HYCLATE 100 MG PO TABS
100.0000 mg | ORAL_TABLET | Freq: Two times a day (BID) | ORAL | 0 refills | Status: AC
Start: 2023-01-20 — End: 2023-01-27

## 2023-01-20 NOTE — Progress Notes (Signed)
47 y.o. [id number] female s/p BTL due to prior ectopic pregnancy x3 (2007-2008) with dyspareunia, menorrhagia, mixed incontinence, OAB, HTN, tobacco use here for f/u of results. Relationship x7 years. Changed partners recently.  Patient's last menstrual period was 12/25/2022.     Discuss M. Homini results. Pt has concerns with a possible bladder or UTI. Pt states she is still spotting x one week. Also has concerns with PID and possible treatment. States she has had a low grade fever over the past few days.   At prior appt, she noted: "She states that she has severe sharp pain with intercourse. She says that stimulation is not painful but penetration is painful. Missionary is sometimes comfortable, but other positions are not. Pain developed ~3 years ago and has gotten worse.   CD1-3 are heavy, patient reports changing pad ~hourly on these days.  Notes daily RLQ pain rated 6-8/10, sometimes with nausea, worse with intercourse. Pain present for almost 3 years"   Normal CBC March 2024 10/12/22 TVUS: Normal, ET 12mm.   Birth control: BTL Last mammogram: 10/16/22 BIRADS 1, density b Sexually active: yes with female partner x7 years, however taking a break from prior relationship, SA with new partner. Smoker: yes, 1/2 ppd  Last PAP:    Component Value Date/Time   DIAGPAP (A) 09/24/2022 1340    - Atypical squamous cells of undetermined significance (ASC-US)   DIAGPAP  10/01/2017 0000    NEGATIVE FOR INTRAEPITHELIAL LESIONS OR MALIGNANCY.   HPVHIGH Negative 09/24/2022 1340   ADEQPAP  09/24/2022 1340    Satisfactory for evaluation; transformation zone component PRESENT.   ADEQPAP  10/01/2017 0000    Satisfactory for evaluation  endocervical/transformation zone component PRESENT.    GYN HISTORY: s/p BTL due to prior ectopic pregnancy x3 (2007-2008) Hx of PID Hx of chlamydia  OB History  Gravida Para Term Preterm AB Living  6 3     3 3   SAB IAB Ectopic Multiple Live Births  3             # Outcome Date GA Lbr Len/2nd Weight Sex Type Anes PTL Lv  6 Para           5 Para           4 Para           3 SAB           2 SAB           1 SAB             Past Medical History:  Diagnosis Date   Abscess of buttock 08/13/2019   Asthma    Bacterial vaginosis    Boil 06/14/2019   BV (bacterial vaginosis) 09/13/2008        Hypertension 05/11/2013   Kidney stone    OVARIAN CYST, LEFT 10/29/2008   Qualifier: Diagnosis of  By: Lanier Prude  MD, Taineisha     Pain in the chest 06/06/2015   Painful lumpy right breast 03/21/2013   Recurrent cold sores 12/05/2015   Recurrent sinusitis 06/06/2015   Sinus congestion 04/25/2018   TUBAL PREGNANCY 09/13/2008   Qualifier: History of  By: Lanier Prude  MD, Taineisha     Vaginal discharge 01/10/2014   Vaginal discharge 01/10/2014    Past Surgical History:  Procedure Laterality Date   CYSTECTOMY     CYSTOSCOPY WITH RETROGRADE PYELOGRAM, URETEROSCOPY AND STENT PLACEMENT Left 02/22/2014   Procedure: CYSTOSCOPY WITH RETROGRADE PYELOGRAM, URETEROSCOPY, STONE BASKET EXTRACTION ;  Surgeon: Kathi Ludwig, MD;  Location: [facility];  Service: Urology;  Laterality: Left;   tailbone cyst     TUBAL LIGATION      Current Outpatient Medications on File Prior to Visit  Medication Sig Dispense Refill   acyclovir (ZOVIRAX) 400 MG tablet TAKE 1 TABLET(400 MG) BY MOUTH TWICE DAILY 60 tablet 2   albuterol (VENTOLIN HFA) 108 (90 Base) MCG/ACT inhaler Inhale 2 puffs into the lungs every 6 (six) hours as needed. 18 g 0   Boric Acid Vaginal 600 MG SUPP Place 1 suppository vaginally at bedtime. 30 suppository 0   cetirizine (ZYRTEC) 10 MG tablet TAKE 1 TABLET(10 MG) BY MOUTH DAILY 90 tablet 1   clindamycin (CLEOCIN) 300 MG capsule Take 1 capsule (300 mg total) by mouth in the morning and at bedtime for 10 days. 20 capsule 0   fluticasone (FLONASE) 50 MCG/ACT nasal spray SHAKE LIQUID AND USE 2 SPRAYS IN EACH NOSTRIL DAILY 16 g 12   hydrocortisone (ANUSOL-HC) 2.5 % rectal  cream Place 1 Application rectally 2 (two) times daily. 30 g 1   hydrocortisone (ANUSOL-HC) 25 MG suppository Place 1 suppository (25 mg total) rectally 2 (two) times daily. 12 suppository 0   ibuprofen (ADVIL) 600 MG tablet Take 1 tablet (600 mg total) by mouth every 6 (six) hours as needed. 30 tablet 0   Multiple Vitamins-Minerals (ULTRA WOMENS PACK PO) Take by mouth.     NIFEdipine (PROCARDIA-XL/NIFEDICAL-XL) 30 MG 24 hr tablet TAKE 1 TABLET(30 MG) BY MOUTH AT BEDTIME 90 tablet 3   norethindrone (ORTHO MICRONOR) 0.35 MG tablet Take 1 tablet (0.35 mg total) by mouth daily. 28 tablet 11   ondansetron (ZOFRAN) 4 MG tablet Take 1 tablet (4 mg total) by mouth every 8 (eight) hours as needed for nausea or vomiting. 30 tablet 1   phenylephrine-shark liver oil-mineral oil-petrolatum (PREPARATION H) 0.25-14-74.9 % rectal ointment Place 1 Application rectally 2 (two) times daily as needed for hemorrhoids. 56 g 1   No current facility-administered medications on file prior to visit.    Allergies  Allergen Reactions   Flagyl [Metronidazole] Other (See Comments)    Migraine & Nausea   Doxycycline     REACTION: Nausea      PE Today's Vitals   01/20/23 1525  BP: 118/80  Pulse: (!) 105  Temp: 99.3 F (37.4 C)  TempSrc: Oral  SpO2: 98%  Weight: 163 lb (73.9 kg)    Body mass index is 29.81 kg/m.  Physical Exam Vitals reviewed.  Constitutional:      General: She is not in acute distress.    Appearance: Normal appearance.  HENT:     Head: Normocephalic and atraumatic.     Nose: Nose normal.  Eyes:     Extraocular Movements: Extraocular movements intact.     Conjunctiva/sclera: Conjunctivae normal.  Pulmonary:     Effort: Pulmonary effort is normal.  Musculoskeletal:        General: Normal range of motion.     Cervical back: Normal range of motion.  Neurological:     General: No focal deficit present.     Mental Status: She is alert.  Psychiatric:        Mood and Affect: Mood  normal.        Behavior: Behavior normal.    Assessment and Plan:        BV (bacterial vaginosis) -     Doxycycline Hyclate; Take 1 tablet (100 mg total) by mouth 2 (two)  times daily for 7 days. Partner treatment Lyn Hollingshead Flower DOB 05/17/67).  Dispense: 14 tablet; Refill: 0  Abnormal uterine bleeding (AUB) Assessment & Plan: Recent STD test normal TVUS also normal CBC wnl 2024, and hormonal labs normal EMB benign Continue POP given HTN and CPP and dyspareunia symptoms. Recommend 3 month trial, RTO for reassessment at this time  Chronic pelvic pain in female Assessment & Plan: Notes RLQ pain rated 6-8/10 almost daily, sometimes with nausea x3 years Worse with intercourse Discussed clinical presentation and evaluation is not consistent with PID Trial of POP, Zofran Recommend PFPT RTO in 3 months  Orders: -     Urinalysis,Complete w/RFL Culture  Dyspareunia, female Assessment & Plan: Will call PFPT to schedule this week  History of kidney stones -     Ambulatory referral to Urology  Overactive bladder -     Ambulatory referral to Urology   Patient to RTO for BV testing after completion of boric acid.   Rosalyn Gess, MD

## 2023-01-20 NOTE — Assessment & Plan Note (Signed)
Recent STD test normal TVUS also normal CBC wnl 2024, and hormonal labs normal EMB benign Continue POP given HTN and CPP and dyspareunia symptoms. Recommend 3 month trial, RTO for reassessment at this time

## 2023-01-20 NOTE — Telephone Encounter (Signed)
See result note. Patient is scheduled for OV today, 01/20/23.   Encounter closed.

## 2023-01-20 NOTE — Assessment & Plan Note (Signed)
 Will call PFPT to schedule this week

## 2023-01-20 NOTE — Assessment & Plan Note (Addendum)
Notes RLQ pain rated 6-8/10 almost daily, sometimes with nausea x3 years Worse with intercourse Trial of POP, Zofran Recommend PFPT RTO in 3 months

## 2023-01-22 ENCOUNTER — Encounter (HOSPITAL_COMMUNITY): Payer: Self-pay

## 2023-01-22 ENCOUNTER — Ambulatory Visit (HOSPITAL_COMMUNITY)
Admission: RE | Admit: 2023-01-22 | Discharge: 2023-01-22 | Disposition: A | Discharge status: Home / Self Care | Payer: Medicaid Other | Source: Ambulatory Visit | Attending: Family Medicine | Admitting: Family Medicine

## 2023-01-22 VITALS — BP 140/110 | HR 92 | Temp 98.1°F | Resp 18

## 2023-01-22 DIAGNOSIS — J4521 Mild intermittent asthma with (acute) exacerbation: Secondary | ICD-10-CM | POA: Diagnosis not present

## 2023-01-22 DIAGNOSIS — J069 Acute upper respiratory infection, unspecified: Secondary | ICD-10-CM

## 2023-01-22 MED ORDER — PROMETHAZINE-DM 6.25-15 MG/5ML PO SYRP: 5.0000 mL | ORAL_SOLUTION | Freq: Four times a day (QID) | ORAL | 0 refills | Status: DC | PRN

## 2023-01-22 MED ORDER — PREDNISONE 20 MG PO TABS: 40.0000 mg | ORAL_TABLET | Freq: Every day | ORAL | 0 refills | Status: AC

## 2023-01-22 NOTE — ED Provider Notes (Signed)
[facility]    CSN: [id number] Arrival date & time: 01/22/23  1502      History   Chief Complaint Chief Complaint  Patient presents with   Cough    Entered by patient   Wheezing   Fever   Headache    HPI Gail Bass is a 47 y.o. female.    Cough Associated symptoms: fever, headaches and wheezing   Wheezing Associated symptoms: cough, fever and headaches   Fever Associated symptoms: cough and headaches   Headache Associated symptoms: cough and fever   Here for cough and fever and headache and wheezing.  Symptoms began on November 17.  The fever broke yesterday and she has not had any fever today.  She has been nauseated but no vomiting.  She is actually now taking some clindamycin for Mycoplasma hominis that was discovered after endometrial sampling.    Past Medical History:  Diagnosis Date   Abscess of buttock 08/13/2019   Asthma    Bacterial vaginosis    Boil 06/14/2019   BV (bacterial vaginosis) 09/13/2008        Hypertension 05/11/2013   Kidney stone    OVARIAN CYST, LEFT 10/29/2008   Qualifier: Diagnosis of  By: Lanier Prude  MD, Taineisha     Pain in the chest 06/06/2015   Painful lumpy right breast 03/21/2013   Recurrent cold sores 12/05/2015   Recurrent sinusitis 06/06/2015   Sinus congestion 04/25/2018   TUBAL PREGNANCY 09/13/2008   Qualifier: History of  By: Lanier Prude  MD, Taineisha     Vaginal discharge 01/10/2014   Vaginal discharge 01/10/2014    Patient Active Problem List   Diagnosis Date Noted   Chronic pelvic pain in female 01/13/2023   Abnormal uterine bleeding (AUB) 12/30/2022   History of ectopic pregnancy 12/30/2022   Screening for cervical cancer 09/24/2022   Screening for colon cancer 09/24/2022   Screening examination for STD (sexually transmitted disease) 09/24/2022   Hot flashes 05/10/2022   Milia of right eyelid 08/27/2021   Viral illness 02/27/2021   Back pain 02/13/2021   Overactive bladder 07/03/2020   Levator spasm  07/03/2020   Dyspareunia, female 07/03/2020   Mixed stress and urge urinary incontinence 04/11/2020   History of COVID-19 03/13/2020   Seasonal allergies 06/14/2019   PTSD (post-traumatic stress disorder) 12/05/2015   External hemorrhoids 08/07/2015   Vaginal discharge 01/10/2014   Hypertension 05/11/2013   Tobacco use disorder 03/21/2013   Asthma 12/23/2009   Migraine headache 09/13/2008   Depression 06/21/2008    Past Surgical History:  Procedure Laterality Date   CYSTECTOMY     CYSTOSCOPY WITH RETROGRADE PYELOGRAM, URETEROSCOPY AND STENT PLACEMENT Left 02/22/2014   Procedure: CYSTOSCOPY WITH RETROGRADE PYELOGRAM, URETEROSCOPY, STONE BASKET EXTRACTION ;  Surgeon: Kathi Ludwig, MD;  Location: [facility];  Service: Urology;  Laterality: Left;   tailbone cyst     TUBAL LIGATION      OB History     Gravida  6   Para  3   Term      Preterm      AB  3   Living  3      SAB  3   IAB      Ectopic      Multiple      Live Births               Home Medications    Prior to Admission medications   Medication Sig Start Date End Date Taking?  Authorizing Provider  predniSONE (DELTASONE) 20 MG tablet Take 2 tablets (40 mg total) by mouth daily with breakfast for 5 days. 01/22/23 01/27/23 Yes Zenia Resides, MD  promethazine-dextromethorphan (PROMETHAZINE-DM) 6.25-15 MG/5ML syrup Take 5 mLs by mouth 4 (four) times daily as needed for cough. 01/22/23  Yes Birney Belshe, Janace Aris, MD  acyclovir (ZOVIRAX) 400 MG tablet TAKE 1 TABLET(400 MG) BY MOUTH TWICE DAILY 09/08/22   Vonna Drafts, MD  albuterol (VENTOLIN HFA) 108 (90 Base) MCG/ACT inhaler Inhale 2 puffs into the lungs every 6 (six) hours as needed. 12/23/22   Rising, Lurena Joiner, PA-C  Boric Acid Vaginal 600 MG SUPP Place 1 suppository vaginally at bedtime. 01/15/23 02/14/23  Rosalyn Gess, MD  cetirizine (ZYRTEC) 10 MG tablet TAKE 1 TABLET(10 MG) BY MOUTH DAILY 09/08/22   Vonna Drafts, MD  clindamycin (CLEOCIN) 300  MG capsule Take 1 capsule (300 mg total) by mouth in the morning and at bedtime for 10 days. 01/15/23 01/25/23  Rosalyn Gess, MD  doxycycline (VIBRA-TABS) 100 MG tablet Take 1 tablet (100 mg total) by mouth 2 (two) times daily for 7 days. Partner treatment. 01/20/23 01/27/23  Rosalyn Gess, MD  fluticasone (FLONASE) 50 MCG/ACT nasal spray SHAKE LIQUID AND USE 2 SPRAYS IN [facility] NOSTRIL DAILY 04/29/22   Valetta Close, MD  hydrocortisone (ANUSOL-HC) 2.5 % rectal cream Place 1 Application rectally 2 (two) times daily. 08/27/21   Valetta Close, MD  hydrocortisone (ANUSOL-HC) 25 MG suppository Place 1 suppository (25 mg total) rectally 2 (two) times daily. 08/27/21   Valetta Close, MD  ibuprofen (ADVIL) 600 MG tablet Take 1 tablet (600 mg total) by mouth every 6 (six) hours as needed. 08/13/19   McDonald, Mia A, PA-C  Multiple Vitamins-Minerals (ULTRA WOMENS PACK PO) Take by mouth.    [provider]  NIFEdipine (PROCARDIA-XL/NIFEDICAL-XL) 30 MG 24 hr tablet TAKE 1 TABLET(30 MG) BY MOUTH AT BEDTIME 06/09/22   Valetta Close, MD  norethindrone (ORTHO MICRONOR) 0.35 MG tablet Take 1 tablet (0.35 mg total) by mouth daily. 01/13/23   Hines, Lennox Solders, MD  ondansetron (ZOFRAN) 4 MG tablet Take 1 tablet (4 mg total) by mouth every 8 (eight) hours as needed for nausea or vomiting. 01/13/23   Rosalyn Gess, MD  phenylephrine-shark liver oil-mineral oil-petrolatum (PREPARATION H) 0.25-14-74.9 % rectal ointment Place 1 Application rectally 2 (two) times daily as needed for hemorrhoids. 05/08/22   Valetta Close, MD    Family History Family History  Problem Relation Age of Onset   Heart Problems Mother    Heart failure Mother    Arrhythmia Father        Pacemakers placed didn't take passed away 3 moths later   Arrhythmia Sister        Pacemaker didn't take to body passed away 3 months ;later   Hypertension Maternal Aunt    Cervical cancer Maternal Aunt        stage 4 type B    Heart failure Maternal Grandmother    Hypertension Maternal Grandfather    Endometrial cancer Maternal Aunt    Endometriosis Maternal Aunt     Social History Social History   Tobacco Use   Smoking status: Every Day    Current packs/day: 0.50    Average packs/day: 0.5 packs/day for 10.2 years (5.1 ttl pk-yrs)    Types: Cigarettes    Start date: 11/19/2012    Passive exposure: Current   Smokeless tobacco: Never   Tobacco comments:  Reports attempting cessation with patches.   Vaping Use   Vaping status: Never Used  Substance Use Topics   Alcohol use: No   Drug use: Yes    Types: Marijuana     Allergies   Flagyl [metronidazole] and Doxycycline   Review of Systems Review of Systems  Constitutional:  Positive for fever.  Respiratory:  Positive for cough and wheezing.   Neurological:  Positive for headaches.     Physical Exam Triage Vital Signs ED Triage Vitals  Encounter Vitals Group     BP 01/22/23 1530 (!) 161/101     Systolic BP Percentile --      Diastolic BP Percentile --      Pulse Rate 01/22/23 1530 92     Resp 01/22/23 1530 18     Temp 01/22/23 1530 98.1 F (36.7 C)     Temp Source 01/22/23 1530 Oral     SpO2 01/22/23 1530 96 %     Weight --      Height --      Head Circumference --      Peak Flow --      Pain Score 01/22/23 1531 6     Pain Loc --      Pain Education --      Exclude from Growth Chart --    No data found.  Updated Vital Signs BP (!) 161/101 (BP Location: Right Arm)   Pulse 92   Temp 98.1 F (36.7 C) (Oral)   Resp 18   LMP 01/22/2023 (Exact Date)   SpO2 96%   Visual Acuity Right Eye Distance:   Left Eye Distance:   Bilateral Distance:    Right Eye Near:   Left Eye Near:    Bilateral Near:     Physical Exam Vitals reviewed.  Constitutional:      General: She is not in acute distress.    Appearance: She is not ill-appearing, toxic-appearing or diaphoretic.  HENT:     Right Ear: Tympanic membrane and ear canal  normal.     Left Ear: Tympanic membrane and ear canal normal.     Nose: Nose normal.     Mouth/Throat:     Mouth: Mucous membranes are moist.     Pharynx: No oropharyngeal exudate or posterior oropharyngeal erythema.  Eyes:     Extraocular Movements: Extraocular movements intact.     Conjunctiva/sclera: Conjunctivae normal.     Pupils: Pupils are equal, round, and reactive to light.  Cardiovascular:     Rate and Rhythm: Normal rate and regular rhythm.     Heart sounds: No murmur heard. Pulmonary:     Effort: No respiratory distress.     Breath sounds: No stridor. No rhonchi or rales.     Comments: There is some expiratory wheezing heard bilaterally. Chest:     Chest wall: No tenderness.  Musculoskeletal:     Cervical back: Neck supple.  Lymphadenopathy:     Cervical: No cervical adenopathy.  Skin:    Capillary Refill: Capillary refill takes less than 2 seconds.     Coloration: Skin is not jaundiced or pale.  Neurological:     General: No focal deficit present.     Mental Status: She is alert and oriented to person, place, and time.  Psychiatric:        Behavior: Behavior normal.      UC Treatments / Results  Labs (all labs ordered are listed, but only abnormal results are displayed) Labs Reviewed -  No data to display  EKG   Radiology No results found.  Procedures Procedures (including critical care time)  Medications Ordered in UC Medications - No data to display  Initial Impression / Assessment and Plan / UC Course  I have reviewed the triage vital signs and the nursing notes.  Pertinent labs & imaging results that were available during my care of the patient were reviewed by me and considered in my medical decision making (see chart for details).     I am sending prednisone burst for 5 days plus phenergan with DM, for asthma exacerbation.  No viral testing done today, since her fever has resolved, and she is on day 5-6 of symptoms Final Clinical  Impressions(s) / UC Diagnoses   Final diagnoses:  Mild intermittent asthma with exacerbation  Viral upper respiratory tract infection     Discharge Instructions      Take prednisone 20 mg--2 daily for 5 days  Take Phenergan with dextromethorphan syrup--5 mL or 1 teaspoon every 6 hours as needed for cough      ED Prescriptions     Medication Sig Dispense Auth. Provider   predniSONE (DELTASONE) 20 MG tablet Take 2 tablets (40 mg total) by mouth daily with breakfast for 5 days. 10 tablet Zenia Resides, MD   promethazine-dextromethorphan (PROMETHAZINE-DM) 6.25-15 MG/5ML syrup Take 5 mLs by mouth 4 (four) times daily as needed for cough. 118 mL Zenia Resides, MD      PDMP not reviewed this encounter.   Zenia Resides, MD 01/22/23 [phone]

## 2023-01-22 NOTE — Discharge Instructions (Addendum)
Take prednisone 20 mg--2 daily for 5 days  Take Phenergan with dextromethorphan syrup--5 mL or 1 teaspoon every 6 hours as needed for cough

## 2023-01-22 NOTE — ED Triage Notes (Signed)
Pt presents with complaints of a headache, wheezing, cough, nasal congestion, and fever x 5 days. Pt reports she has tried promethazine DM, albuterol inhaler every 4-6 hrs, Ibuprofen with little improvement in symptoms. Pt reports mold on vents noted at home, not sure if symptoms are correlated. Pt currently rates her headache pain a 6/10. Last dose of Ibuprofen @ 0715 this AM, "my fever broke yesterday."

## 2023-02-13 ENCOUNTER — Other Ambulatory Visit: Payer: Self-pay | Admitting: Family Medicine

## 2023-02-13 DIAGNOSIS — B001 Herpesviral vesicular dermatitis: Secondary | ICD-10-CM

## 2023-02-21 ENCOUNTER — Encounter (HOSPITAL_COMMUNITY): Payer: Self-pay

## 2023-02-21 ENCOUNTER — Ambulatory Visit (HOSPITAL_COMMUNITY)
Admission: RE | Admit: 2023-02-21 | Discharge: 2023-02-21 | Disposition: A | Discharge status: Home / Self Care | Payer: Medicaid Other | Source: Ambulatory Visit | Attending: Nurse Practitioner | Admitting: Nurse Practitioner

## 2023-02-21 VITALS — BP 171/96 | HR 89 | Temp 98.1°F | Resp 17

## 2023-02-21 DIAGNOSIS — Z1152 Encounter for screening for COVID-19: Secondary | ICD-10-CM | POA: Diagnosis not present

## 2023-02-21 DIAGNOSIS — J4531 Mild persistent asthma with (acute) exacerbation: Secondary | ICD-10-CM | POA: Diagnosis not present

## 2023-02-21 MED ORDER — PREDNISONE 20 MG PO TABS
40.0000 mg | ORAL_TABLET | Freq: Every day | ORAL | 0 refills | Status: AC
Start: 2023-02-21 — End: 2023-02-26

## 2023-02-21 MED ORDER — PROMETHAZINE-DM 6.25-15 MG/5ML PO SYRP: 5.0000 mL | ORAL_SOLUTION | Freq: Every evening | ORAL | 0 refills | Status: DC | PRN

## 2023-02-21 MED ORDER — ALBUTEROL SULFATE HFA 108 (90 BASE) MCG/ACT IN AERS
2.0000 | INHALATION_SPRAY | Freq: Four times a day (QID) | RESPIRATORY_TRACT | 0 refills | Status: DC | PRN
Start: 2023-02-21 — End: 2023-06-21

## 2023-02-21 NOTE — ED Provider Notes (Signed)
[facility]    CSN: [id number] Arrival date & time: 02/21/23  1208      History   Chief Complaint Chief Complaint  Patient presents with   Wheezing    Entered by patient    HPI Gail Bass is a 47 y.o. female.   Patient presents today with 3 to 4-day history of chills and feeling hot/sweaty, congested cough, shortness of breath, wheezing, chest tightness for Singh in the morning and gets better throughout the day, runny and stuffy nose, sore throat, headache, nausea from she thinks postnasal drainage, decreased appetite, and fatigue.  She denies known fever or bodyaches, chest pain, ear pain, abdominal pain, vomiting, diarrhea.  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  Has been using humidifier, promethazine cough syrup, Mucinex, over-the-counter elderberry, and albuterol inhaler which does help with wheezing and shortness of breath.  Patient reports history of asthma.  She also reports history of smoking about 30 years less than 1 pack/day.    Past Medical History:  Diagnosis Date   Abscess of buttock 08/13/2019   Asthma    Bacterial vaginosis    Boil 06/14/2019   BV (bacterial vaginosis) 09/13/2008        Hypertension 05/11/2013   Kidney stone    OVARIAN CYST, LEFT 10/29/2008   Qualifier: Diagnosis of  By: Lanier Prude  MD, Taineisha     Pain in the chest 06/06/2015   Painful lumpy right breast 03/21/2013   Recurrent cold sores 12/05/2015   Recurrent sinusitis 06/06/2015   Sinus congestion 04/25/2018   TUBAL PREGNANCY 09/13/2008   Qualifier: History of  By: Lanier Prude  MD, Taineisha     Vaginal discharge 01/10/2014   Vaginal discharge 01/10/2014    Patient Active Problem List   Diagnosis Date Noted   Chronic pelvic pain in female 01/13/2023   Abnormal uterine bleeding (AUB) 12/30/2022   History of ectopic pregnancy 12/30/2022   Screening for cervical cancer 09/24/2022   Screening for colon cancer  09/24/2022   Screening examination for STD (sexually transmitted disease) 09/24/2022   Hot flashes 05/10/2022   Milia of right eyelid 08/27/2021   Viral illness 02/27/2021   Back pain 02/13/2021   Overactive bladder 07/03/2020   Levator spasm 07/03/2020   Dyspareunia, female 07/03/2020   Mixed stress and urge urinary incontinence 04/11/2020   History of COVID-19 03/13/2020   Seasonal allergies 06/14/2019   PTSD (post-traumatic stress disorder) 12/05/2015   External hemorrhoids 08/07/2015   Vaginal discharge 01/10/2014   Hypertension 05/11/2013   Tobacco use disorder 03/21/2013   Asthma 12/23/2009   Migraine headache 09/13/2008   Depression 06/21/2008    Past Surgical History:  Procedure Laterality Date   CYSTECTOMY     CYSTOSCOPY WITH RETROGRADE PYELOGRAM, URETEROSCOPY AND STENT PLACEMENT Left 02/22/2014   Procedure: CYSTOSCOPY WITH RETROGRADE PYELOGRAM, URETEROSCOPY, STONE BASKET EXTRACTION ;  Surgeon: Kathi Ludwig, MD;  Location: [facility];  Service: Urology;  Laterality: Left;   tailbone cyst     TUBAL LIGATION      OB History     Gravida  6   Para  3   Term      Preterm      AB  3   Living  3      SAB  3   IAB      Ectopic      Multiple      Live Births  Home Medications    Prior to Admission medications   Medication Sig Start Date End Date Taking? Authorizing Provider  predniSONE (DELTASONE) 20 MG tablet Take 2 tablets (40 mg total) by mouth daily with breakfast for 5 days. 02/21/23 02/26/23 Yes Valentino Nose, NP  promethazine-dextromethorphan (PROMETHAZINE-DM) 6.25-15 MG/5ML syrup Take 5 mLs by mouth at bedtime as needed for cough. Do not take with alcohol or while driving or operating heavy machinery.  May cause drowsiness. 02/21/23  Yes Valentino Nose, NP  acyclovir (ZOVIRAX) 400 MG tablet TAKE 1 TABLET(400 MG) BY MOUTH TWICE DAILY Patient not taking: Reported on 02/21/2023 02/15/23   Fortunato Curling, DO   albuterol (VENTOLIN HFA) 108 (90 Base) MCG/ACT inhaler Inhale 2 puffs into the lungs every 6 (six) hours as needed. 02/21/23   Valentino Nose, NP  cetirizine (ZYRTEC) 10 MG tablet TAKE 1 TABLET(10 MG) BY MOUTH DAILY 09/08/22   Vonna Drafts, MD  fluticasone [facility]) 50 MCG/ACT nasal spray SHAKE LIQUID AND USE 2 SPRAYS IN [facility] NOSTRIL DAILY 04/29/22   Valetta Close, MD  hydrocortisone (ANUSOL-HC) 2.5 % rectal cream Place 1 Application rectally 2 (two) times daily. 08/27/21   Valetta Close, MD  hydrocortisone (ANUSOL-HC) 25 MG suppository Place 1 suppository (25 mg total) rectally 2 (two) times daily. 08/27/21   Valetta Close, MD  ibuprofen (ADVIL) 600 MG tablet Take 1 tablet (600 mg total) by mouth every 6 (six) hours as needed. 08/13/19   McDonald, Mia A, PA-C  Multiple Vitamins-Minerals (ULTRA WOMENS PACK PO) Take by mouth.    [provider]  NIFEdipine (PROCARDIA-XL/NIFEDICAL-XL) 30 MG 24 hr tablet TAKE 1 TABLET(30 MG) BY MOUTH AT BEDTIME 06/09/22   Valetta Close, MD  norethindrone (ORTHO MICRONOR) 0.35 MG tablet Take 1 tablet (0.35 mg total) by mouth daily. 01/13/23   Hines, Lennox Solders, MD  ondansetron (ZOFRAN) 4 MG tablet Take 1 tablet (4 mg total) by mouth every 8 (eight) hours as needed for nausea or vomiting. Patient not taking: Reported on 02/21/2023 01/13/23   Rosalyn Gess, MD  phenylephrine-shark liver oil-mineral oil-petrolatum (PREPARATION H) 0.25-14-74.9 % rectal ointment Place 1 Application rectally 2 (two) times daily as needed for hemorrhoids. 05/08/22   Valetta Close, MD    Family History Family History  Problem Relation Age of Onset   Heart Problems Mother    Heart failure Mother    Arrhythmia Father        Pacemakers placed didn't take passed away 3 moths later   Arrhythmia Sister        Pacemaker didn't take to body passed away 3 months ;later   Hypertension Maternal Aunt    Cervical cancer Maternal Aunt        stage 4 type B   Heart  failure Maternal Grandmother    Hypertension Maternal Grandfather    Endometrial cancer Maternal Aunt    Endometriosis Maternal Aunt     Social History Social History   Tobacco Use   Smoking status: Every Day    Current packs/day: 0.50    Average packs/day: 0.5 packs/day for 10.3 years (5.1 ttl pk-yrs)    Types: Cigarettes    Start date: 11/19/2012    Passive exposure: Current   Smokeless tobacco: Never   Tobacco comments:    Reports attempting cessation with patches.   Vaping Use   Vaping status: Never Used  Substance Use Topics   Alcohol use: No   Drug use: Yes  Types: Marijuana     Allergies   Flagyl [metronidazole] and Doxycycline   Review of Systems Review of Systems Per HPI  Physical Exam Triage Vital Signs ED Triage Vitals  Encounter Vitals Group     BP 02/21/23 1225 (!) 171/96     Systolic BP Percentile --      Diastolic BP Percentile --      Pulse Rate 02/21/23 1225 89     Resp 02/21/23 1225 17     Temp 02/21/23 1225 98.1 F (36.7 C)     Temp Source 02/21/23 1225 Oral     SpO2 02/21/23 1225 96 %     Weight --      Height --      Head Circumference --      Peak Flow --      Pain Score 02/21/23 1222 6     Pain Loc --      Pain Education --      Exclude from Growth Chart --    No data found.  Updated Vital Signs BP (!) 171/96 (BP Location: Left Arm)   Pulse 89   Temp 98.1 F (36.7 C) (Oral)   Resp 17   LMP 01/22/2023 (Exact Date)   SpO2 96%   Visual Acuity Right Eye Distance:   Left Eye Distance:   Bilateral Distance:    Right Eye Near:   Left Eye Near:    Bilateral Near:     Physical Exam Vitals and nursing note reviewed.  Constitutional:      General: She is not in acute distress.    Appearance: Normal appearance. She is not ill-appearing or toxic-appearing.  HENT:     Head: Normocephalic and atraumatic.     Right Ear: Tympanic membrane, ear canal and external ear normal.     Left Ear: Tympanic membrane, ear canal and  external ear normal.     Nose: Congestion present. No rhinorrhea.     Mouth/Throat:     Mouth: Mucous membranes are moist.     Pharynx: Oropharynx is clear. Uvula midline. Posterior oropharyngeal erythema present. No oropharyngeal exudate or uvula swelling.     Tonsils: No tonsillar exudate.  Eyes:     General: No scleral icterus.    Extraocular Movements: Extraocular movements intact.  Cardiovascular:     Rate and Rhythm: Normal rate and regular rhythm.  Pulmonary:     Effort: Pulmonary effort is normal. No respiratory distress.     Breath sounds: Wheezing (faint upper airway expiratory wheezes) present. No rhonchi or rales.  Musculoskeletal:     Cervical back: Normal range of motion and neck supple.  Lymphadenopathy:     Cervical: No cervical adenopathy.  Skin:    General: Skin is warm and dry.     Coloration: Skin is not jaundiced or pale.     Findings: No erythema or rash.  Neurological:     Mental Status: She is alert and oriented to person, place, and time.  Psychiatric:        Behavior: Behavior is cooperative.      UC Treatments / Results  Labs (all labs ordered are listed, but only abnormal results are displayed) Labs Reviewed  SARS CORONAVIRUS 2 (TAT 6-24 HRS)    EKG   Radiology No results found.  Procedures Procedures (including critical care time)  Medications Ordered in UC Medications - No data to display  Initial Impression / Assessment and Plan / UC Course  I have reviewed the triage  vital signs and the nursing notes.  Pertinent labs & imaging results that were available during my care of the patient were reviewed by me and considered in my medical decision making (see chart for details).   Patient is well-appearing, afebrile, not tachycardic, not tachypneic, oxygenating well on room air.  Patient is hypertensive in triage today.  1. Mild persistent asthma with acute exacerbation 2. Encounter for screening for COVID-19 Overall, vitals and exam  are reassuring COVID-19 testing obtained, if positive and patient desires Paxlovid, would recommend holding nifedipine while on Paxlovid Influenza testing deferred as patient is out of window for treatment Other supportive care discussed, refill given for albuterol inhaler and recommended using every 4-6 hours around-the-clock for the next 2 days Start cough suppressant medication and prednisone to help with airway irritation/inflammation Return and ER precautions discussed  The patient was given the opportunity to ask questions.  All questions answered to their satisfaction.  The patient is in agreement to this plan.    Final Clinical Impressions(s) / UC Diagnoses   Final diagnoses:  Mild persistent asthma with acute exacerbation  Encounter for screening for COVID-19     Discharge Instructions      As we discussed, you are likely having exacerbation of asthma due to a viral upper respiratory infection.  Symptoms should improve over the next week to 10 days.  If you develop chest pain or shortness of breath, go to the emergency room.  We have tested you today for COVID-19.  You will see the results in Mychart and we will contact you with positive results.  Please stay home and isolate until you are fever free for 24 hours without fever reducing medication.    Recommend using albuterol inhaler around-the-clock for the next 24 to 48 hours every 4-6 hours whether or not you are wheezing.  Then, use albuterol as needed.  This will help keep your airway open.  In addition, start the prednisone and take as prescribed.  Some things that can make you feel better are: - Increased rest - Increasing fluid with water/sugar free electrolytes - Acetaminophen and ibuprofen as needed for fever/pain - Salt water gargling, chloraseptic spray and throat lozenges - OTC guaifenesin (Mucinex) 600 mg twice daily - Saline sinus flushes or a neti pot - Humidifying the air - cough syrup as needed for dry  cough     ED Prescriptions     Medication Sig Dispense Auth. Provider   predniSONE (DELTASONE) 20 MG tablet Take 2 tablets (40 mg total) by mouth daily with breakfast for 5 days. 10 tablet Cathlean Marseilles A, NP   promethazine-dextromethorphan (PROMETHAZINE-DM) 6.25-15 MG/5ML syrup Take 5 mLs by mouth at bedtime as needed for cough. Do not take with alcohol or while driving or operating heavy machinery.  May cause drowsiness. 118 mL Cathlean Marseilles A, NP   albuterol (VENTOLIN HFA) 108 (90 Base) MCG/ACT inhaler Inhale 2 puffs into the lungs every 6 (six) hours as needed. 18 g Valentino Nose, NP      PDMP not reviewed this encounter.   Valentino Nose, NP 02/21/23 1331

## 2023-02-21 NOTE — Discharge Instructions (Signed)
As we discussed, you are likely having exacerbation of asthma due to a viral upper respiratory infection.  Symptoms should improve over the next week to 10 days.  If you develop chest pain or shortness of breath, go to the emergency room.  We have tested you today for COVID-19.  You will see the results in Mychart and we will contact you with positive results.  Please stay home and isolate until you are fever free for 24 hours without fever reducing medication.    Recommend using albuterol inhaler around-the-clock for the next 24 to 48 hours every 4-6 hours whether or not you are wheezing.  Then, use albuterol as needed.  This will help keep your airway open.  In addition, start the prednisone and take as prescribed.  Some things that can make you feel better are: - Increased rest - Increasing fluid with water/sugar free electrolytes - Acetaminophen and ibuprofen as needed for fever/pain - Salt water gargling, chloraseptic spray and throat lozenges - OTC guaifenesin (Mucinex) 600 mg twice daily - Saline sinus flushes or a neti pot - Humidifying the air - cough syrup as needed for dry cough

## 2023-02-21 NOTE — ED Triage Notes (Signed)
Pt presents with wheezing, congestion, chills, headache, and chest discomfort. She has been around a lot of sick people recently.  She was seen 1 month ago for same symptoms. States symptoms got a little better but has came back around Thursday.

## 2023-02-22 LAB — SARS CORONAVIRUS 2 (TAT 6-24 HRS): SARS Coronavirus 2: NEGATIVE

## 2023-02-26 ENCOUNTER — Encounter: Payer: Self-pay | Admitting: Obstetrics and Gynecology

## 2023-02-26 ENCOUNTER — Ambulatory Visit: Payer: Medicaid Other | Admitting: Obstetrics and Gynecology

## 2023-02-26 VITALS — BP 148/76 | HR 92 | Wt 162.0 lb

## 2023-02-26 DIAGNOSIS — Z2239 Carrier of other specified bacterial diseases: Secondary | ICD-10-CM | POA: Diagnosis not present

## 2023-02-26 DIAGNOSIS — B9689 Other specified bacterial agents as the cause of diseases classified elsewhere: Secondary | ICD-10-CM

## 2023-02-26 DIAGNOSIS — N760 Acute vaginitis: Secondary | ICD-10-CM

## 2023-02-26 NOTE — Progress Notes (Signed)
47 y.o. [phone] female s/p BTL due to prior ectopic pregnancy x3 (2007-2008) with dyspareunia, menorrhagia, chronic BV (M.hominis), mixed incontinence, OAB, HTN, tobacco use here for BV retesting. Relationship x7 years.   Pt states she has been taking the probiotics, boric acid suppositories, and alive multivitamin as instructed.  Periods and cramping are better after the clindamycin rx.  Partner was also treated with doxycycline. Also taking POP for pelvic pain and menorrhagia  Patient's last menstrual period was 02/18/2023 (exact date).    At prior appt, she noted: "She states that she has severe sharp pain with intercourse. She says that stimulation is not painful but penetration is painful. Missionary is sometimes comfortable, but other positions are not. Pain developed ~3 years ago and has gotten worse.   CD1-3 are heavy, patient reports changing pad ~hourly on these days.  Notes daily RLQ pain rated 6-8/10, sometimes with nausea, worse with intercourse. Pain present for almost 3 years"   Normal CBC March 2024 10/12/22 TVUS: Normal, ET 12mm. 01/13/2023 EMB benign 01/13/2023 positive M hominis   Birth control: BTL Last mammogram: 10/16/22 BIRADS 1, density b Sexually active: yes with female partner x7 years, however taking a break from prior relationship, SA with new partner. Smoker: yes, 1/2 ppd  Last PAP:    Component Value Date/Time   DIAGPAP (A) 09/24/2022 1340    - Atypical squamous cells of undetermined significance (ASC-US)   DIAGPAP  10/01/2017 0000    NEGATIVE FOR INTRAEPITHELIAL LESIONS OR MALIGNANCY.   HPVHIGH Negative 09/24/2022 1340   ADEQPAP  09/24/2022 1340    Satisfactory for evaluation; transformation zone component PRESENT.   ADEQPAP  10/01/2017 0000    Satisfactory for evaluation  endocervical/transformation zone component PRESENT.    GYN HISTORY: s/p BTL due to prior ectopic pregnancy x3 (2007-2008) Hx of PID Hx of chlamydia  OB History  Gravida  Para Term Preterm AB Living  6 3   3 3   SAB IAB Ectopic Multiple Live Births  3        # Outcome Date GA Lbr Len/2nd Weight Sex Type Anes PTL Lv  6 Para           5 Para           4 Para           3 SAB           2 SAB           1 SAB             Past Medical History:  Diagnosis Date   Abscess of buttock 08/13/2019   Asthma    Bacterial vaginosis    Boil 06/14/2019   BV (bacterial vaginosis) 09/13/2008        Hypertension 05/11/2013   Kidney stone    OVARIAN CYST, LEFT 10/29/2008   Qualifier: Diagnosis of  By: Lanier Prude  MD, Taineisha     Pain in the chest 06/06/2015   Painful lumpy right breast 03/21/2013   Recurrent cold sores 12/05/2015   Recurrent sinusitis 06/06/2015   Sinus congestion 04/25/2018   TUBAL PREGNANCY 09/13/2008   Qualifier: History of  By: Lanier Prude  MD, Taineisha     Vaginal discharge 01/10/2014   Vaginal discharge 01/10/2014    Past Surgical History:  Procedure Laterality Date   CYSTECTOMY     CYSTOSCOPY WITH RETROGRADE PYELOGRAM, URETEROSCOPY AND STENT PLACEMENT Left 02/22/2014   Procedure: CYSTOSCOPY WITH RETROGRADE PYELOGRAM, URETEROSCOPY, STONE BASKET EXTRACTION ;  Surgeon: Kathi Ludwig, MD;  Location: [facility];  Service: Urology;  Laterality: Left;   tailbone cyst     TUBAL LIGATION      Current Outpatient Medications on File Prior to Visit  Medication Sig Dispense Refill   albuterol (VENTOLIN HFA) 108 (90 Base) MCG/ACT inhaler Inhale 2 puffs into the lungs every 6 (six) hours as needed. 18 g 0   cetirizine (ZYRTEC) 10 MG tablet TAKE 1 TABLET(10 MG) BY MOUTH DAILY 90 tablet 1   fluticasone (FLONASE) 50 MCG/ACT nasal spray SHAKE LIQUID AND USE 2 SPRAYS IN EACH NOSTRIL DAILY 16 g 12   hydrocortisone (ANUSOL-HC) 2.5 % rectal cream Place 1 Application rectally 2 (two) times daily. 30 g 1   hydrocortisone (ANUSOL-HC) 25 MG suppository Place 1 suppository (25 mg total) rectally 2 (two) times daily. 12 suppository 0   ibuprofen (ADVIL) 600 MG tablet Take 1  tablet (600 mg total) by mouth every 6 (six) hours as needed. 30 tablet 0   Multiple Vitamins-Minerals (ULTRA WOMENS PACK PO) Take by mouth.     NIFEdipine (PROCARDIA-XL/NIFEDICAL-XL) 30 MG 24 hr tablet TAKE 1 TABLET(30 MG) BY MOUTH AT BEDTIME 90 tablet 3   norethindrone (ORTHO MICRONOR) 0.35 MG tablet Take 1 tablet (0.35 mg total) by mouth daily. 28 tablet 11   ondansetron (ZOFRAN) 4 MG tablet Take 1 tablet (4 mg total) by mouth every 8 (eight) hours as needed for nausea or vomiting. 30 tablet 1   phenylephrine-shark liver oil-mineral oil-petrolatum (PREPARATION H) 0.25-14-74.9 % rectal ointment Place 1 Application rectally 2 (two) times daily as needed for hemorrhoids. 56 g 1   predniSONE (DELTASONE) 20 MG tablet Take 2 tablets (40 mg total) by mouth daily with breakfast for 5 days. 10 tablet 0   promethazine-dextromethorphan (PROMETHAZINE-DM) 6.25-15 MG/5ML syrup Take 5 mLs by mouth at bedtime as needed for cough. Do not take with alcohol or while driving or operating heavy machinery.  May cause drowsiness. 118 mL 0   acyclovir (ZOVIRAX) 400 MG tablet TAKE 1 TABLET(400 MG) BY MOUTH TWICE DAILY (Patient not taking: Reported on 02/26/2023) 60 tablet 2   No current facility-administered medications on file prior to visit.    Allergies  Allergen Reactions   Flagyl [Metronidazole] Other (See Comments)    Migraine & Nausea   Doxycycline     REACTION: Nausea      PE Today's Vitals   02/26/23 1023  BP: (!) 148/76  Pulse: 92  SpO2: 98%  Weight: 162 lb (73.5 kg)    Body mass index is 29.63 kg/m.  Physical Exam Vitals reviewed. Exam conducted with a chaperone present.  Constitutional:      General: She is not in acute distress.    Appearance: Normal appearance.  HENT:     Head: Normocephalic and atraumatic.     Nose: Nose normal.  Eyes:     Extraocular Movements: Extraocular movements intact.     Conjunctiva/sclera: Conjunctivae normal.  Pulmonary:     Effort: Pulmonary effort  is normal.  Genitourinary:    General: Normal vulva.     Exam position: Lithotomy position.     Vagina: Normal. No vaginal discharge.     Cervix: Normal. No cervical motion tenderness, discharge or lesion.     Uterus: Normal. Not enlarged and not tender.      Adnexa: Right adnexa normal and left adnexa normal.  Musculoskeletal:        General: Normal range of motion.     Cervical  back: Normal range of motion.  Neurological:     General: No focal deficit present.     Mental Status: She is alert.  Psychiatric:        Mood and Affect: Mood normal.        Behavior: Behavior normal.    Assessment and Plan:        BV (bacterial vaginosis) Chronic BV, status post treatment with clindamycin and daily boric acid over the past month Presents for retesting Orders Placed This Encounter  Procedures   SureSwab Mycoplasma/Ureaplasma, PCR   SureSwab Advanced Vaginitis, TMA    Abnormal uterine bleeding (AUB) Assessment & Plan: Recent STD test normal TVUS also normal CBC wnl 2024, and hormonal labs normal EMB benign Continue POP given HTN and CPP and dyspareunia symptoms. Recommend 3 month trial, RTO as scheduled  Chronic pelvic pain in female Assessment & Plan: Notes RLQ pain rated 6-8/10 almost daily, sometimes with nausea x3 years Worse with intercourse Discussed clinical presentation and evaluation is not consistent with PID Trial of POP, Zofran > pain improving Recommend PFPT, not been able to start at this time due to work RTO in 2 months as scheduled  Dyspareunia, female Assessment & Plan: Will call PFPT to schedule this week  History of kidney stones Referred to urology previously   Rosalyn Gess, MD

## 2023-02-27 LAB — SURESWAB® ADVANCED VAGINITIS,TMA
CANDIDA SPECIES: NOT DETECTED
Candida glabrata: NOT DETECTED
SURESWAB(R) ADV BACTERIAL VAGINOSIS(BV),TMA: NEGATIVE
TRICHOMONAS VAGINALIS (TV),TMA: NOT DETECTED

## 2023-03-01 LAB — SURESWAB MYCOPLASMA/UREAPLASMA, PCR
M. hominis DNA: NOT DETECTED
MYCOPLASMA GENITALIUM, rRNA,TMA: NOT DETECTED
U. parvum DNA: NOT DETECTED
U. urealyticum DNA: DETECTED — AB

## 2023-03-01 MED ORDER — AZITHROMYCIN 500 MG PO TABS
ORAL_TABLET | ORAL | 0 refills | Status: DC
Start: 2023-03-01 — End: 2023-03-25

## 2023-03-01 NOTE — Addendum Note (Signed)
Addended by: Rosalyn Gess on: 03/01/2023 04:29 PM   Modules accepted: Orders

## 2023-03-02 ENCOUNTER — Encounter: Payer: Self-pay | Admitting: Obstetrics and Gynecology

## 2023-03-15 ENCOUNTER — Other Ambulatory Visit: Payer: Self-pay | Admitting: Nurse Practitioner

## 2023-03-16 NOTE — Telephone Encounter (Signed)
 Patient no longer under prescriber's care please send to Janna Ferrier, DO office.  Requested Prescriptions  Pending Prescriptions Disp Refills   VENTOLIN  HFA 108 (90 Base) MCG/ACT inhaler [Pharmacy Med Name: VENTOLIN  HFA INH W/DOS CTR 200PUFFS] 18 g 0    Sig: INHALE 2 PUFFS INTO THE LUNGS EVERY 6 HOURS AS NEEDED     Pulmonology:  Beta Agonists 2 Failed - 03/16/2023  4:23 PM      Failed - Last BP in normal range    BP Readings from Last 1 Encounters:  02/26/23 (!) 148/76         Failed - Valid encounter within last 12 months    Recent Outpatient Visits           4 years ago Recurrent cold sores   [facility] Vic[facility] Barnie NOVAK, MD       Future Appointments             In 1 month Dallie Vera GAILS, MD [facility] - Last Heart Rate in normal range    Pulse Readings from Last 1 Encounters:  02/26/23 92

## 2023-03-25 ENCOUNTER — Encounter (HOSPITAL_COMMUNITY): Payer: Self-pay

## 2023-03-25 ENCOUNTER — Ambulatory Visit (HOSPITAL_COMMUNITY)
Admission: RE | Admit: 2023-03-25 | Discharge: 2023-03-25 | Disposition: A | Discharge status: Home / Self Care | Payer: Medicaid Other | Source: Ambulatory Visit | Attending: Family Medicine | Admitting: Family Medicine

## 2023-03-25 VITALS — BP 148/80 | HR 93 | Temp 98.0°F | Resp 18

## 2023-03-25 DIAGNOSIS — J029 Acute pharyngitis, unspecified: Secondary | ICD-10-CM | POA: Insufficient documentation

## 2023-03-25 DIAGNOSIS — J0100 Acute maxillary sinusitis, unspecified: Secondary | ICD-10-CM | POA: Diagnosis not present

## 2023-03-25 DIAGNOSIS — R448 Other symptoms and signs involving general sensations and perceptions: Secondary | ICD-10-CM | POA: Insufficient documentation

## 2023-03-25 DIAGNOSIS — K219 Gastro-esophageal reflux disease without esophagitis: Secondary | ICD-10-CM | POA: Diagnosis not present

## 2023-03-25 LAB — POCT RAPID STREP A (OFFICE): Rapid Strep A Screen: NEGATIVE

## 2023-03-25 MED ORDER — CEFDINIR 300 MG PO CAPS: 300.0000 mg | ORAL_CAPSULE | Freq: Two times a day (BID) | ORAL | 0 refills | Status: DC

## 2023-03-25 MED ORDER — FAMOTIDINE 40 MG PO TABS
40.0000 mg | ORAL_TABLET | Freq: Every day | ORAL | 0 refills | Status: DC
Start: 2023-03-25 — End: 2023-10-12

## 2023-03-25 NOTE — ED Triage Notes (Signed)
Pt c/o sore throat that began Saturday, has had associated HA, nausea, and fatigue. Has had nasal drainage and sinus pressure/swelling for a couple weeks.

## 2023-03-25 NOTE — ED Provider Notes (Signed)
[facility]   [id number] 03/25/23 Arrival Time: 1419  ASSESSMENT & PLAN:  1. Acute non-recurrent maxillary sinusitis   2. Sore throat   3. Facial pressure   4. Gastroesophageal reflux disease without esophagitis    Suspect sinus drainage and GERD exacerbation causing throat irritation. No sign of oropharyngeal abscess. Discussed.  Meds ordered this encounter  Medications   famotidine (PEPCID) 40 MG tablet    Sig: Take 1 tablet (40 mg total) by mouth daily.    Dispense:  30 tablet    Refill:  0   cefdinir (OMNICEF) 300 MG capsule    Sig: Take 1 capsule (300 mg total) by mouth 2 (two) times daily.    Dispense:  20 capsule    Refill:  0   Trial of above. May f/u with ENT or PCP as needed. OTC symptom care as needed. Ensure adequate fluid intake and rest. Work note provided.    Follow-up Information     Fortunato Curling, DO.   Specialty: Family Medicine Why: If worsening or failing to improve as anticipated. Contact information: [address] [place] [address] [phone]                 Reviewed expectations re: course of current medical issues. Questions answered. Outlined signs and symptoms indicating need for more acute intervention. Patient verbalized understanding. After Visit Summary given.   SUBJECTIVE: History from: patient.  Gail Bass is a 48 y.o. female who presents with complaint of nasal congestion, post-nasal drainage, and sinus pain; facial pressure. Onset gradual,  3-4 week ago; or longer she questions . Respiratory symptoms: occas dry cough; also with persistent ST for same time period. Does have h/o GERD; TUMS without much relief. Fever: denied. Overall normal PO intake without n/v. No tx PTA today.  Social History   Tobacco Use  Smoking Status Every Day   Current packs/day: 0.50   Average packs/day: 0.5 packs/day for 10.3 years (5.2 ttl pk-yrs)   Types: Cigarettes   Start date: 11/19/2012   Passive exposure:  Current  Smokeless Tobacco Never  Tobacco Comments   Reports attempting cessation with patches.     OBJECTIVE:  Vitals:   03/25/23 1504 03/25/23 1510  BP:  (!) 148/80  Pulse: 93   Resp: 18   Temp: 98 F (36.7 C)   TempSrc: Oral   SpO2: 96%     General appearance: alert; no distress HEENT: nasal congestion; clear runny nose; throat irritation/cpbblestoning secondary to post-nasal drainage; bilateral maxillary tenderness to palpation; turbinates boggy Neck: supple without LAD; trachea midline Lungs: unlabored respirations, symmetrical air entry; cough: mild and dry; no respiratory distress Skin: warm and dry Psychological: alert and cooperative; normal mood and affect  Allergies  Allergen Reactions   Flagyl [Metronidazole] Other (See Comments)    Migraine & Nausea   Doxycycline     REACTION: Nausea    Past Medical History:  Diagnosis Date   Abscess of buttock 08/13/2019   Asthma    Bacterial vaginosis    Boil 06/14/2019   BV (bacterial vaginosis) 09/13/2008        Hypertension 05/11/2013   Kidney stone    OVARIAN CYST, LEFT 10/29/2008   Qualifier: Diagnosis of  By: Lanier Prude  MD, Taineisha     Pain in the chest 06/06/2015   Painful lumpy right breast 03/21/2013   Recurrent cold sores 12/05/2015   Recurrent sinusitis 06/06/2015   Sinus congestion 04/25/2018   TUBAL PREGNANCY 09/13/2008   Qualifier:  History of  By: Lanier Prude  MD, Taineisha     Vaginal discharge 01/10/2014   Vaginal discharge 01/10/2014   Family History  Problem Relation Age of Onset   Heart Problems Mother    Heart failure Mother    Arrhythmia Father        Pacemakers placed didn't take passed away 3 moths later   Arrhythmia Sister        Pacemaker didn't take to body passed away 3 months ;later   Hypertension Maternal Aunt    Cervical cancer Maternal Aunt        stage 4 type B   Heart failure Maternal Grandmother    Hypertension Maternal Grandfather    Endometrial cancer Maternal Aunt    Endometriosis  Maternal Aunt    Social History   Socioeconomic History   Marital status: Single    Spouse name: Not on file   Number of children: Not on file   Years of education: Not on file   Highest education level: Not on file  Occupational History   Not on file  Tobacco Use   Smoking status: Every Day    Current packs/day: 0.50    Average packs/day: 0.5 packs/day for 10.3 years (5.2 ttl pk-yrs)    Types: Cigarettes    Start date: 11/19/2012    Passive exposure: Current   Smokeless tobacco: Never   Tobacco comments:    Reports attempting cessation with patches.   Vaping Use   Vaping status: Never Used  Substance and Sexual Activity   Alcohol use: No   Drug use: Yes    Types: Marijuana   Sexual activity: Yes    Partners: Male    Birth control/protection: Surgical    Comment: Tubal ligation  Other Topics Concern   Not on file  Social History Narrative   Has 3 children and one adopted daughter.   Works.    [employer].   Mother lives in [place]. They are estranged because her mother practices withcraft.    Social Drivers of Corporate investment banker Strain: Not on file  Food Insecurity: Not on file  Transportation Needs: Not on file  Physical Activity: Not on file  Stress: Not on file  Social Connections: Not on file  Intimate Partner Violence: Not on file             Mardella Layman, MD 03/25/23 1558

## 2023-03-28 LAB — CULTURE, GROUP A STREP (THRC)

## 2023-04-15 ENCOUNTER — Ambulatory Visit: Payer: Medicaid Other | Admitting: Obstetrics and Gynecology

## 2023-04-15 ENCOUNTER — Encounter: Payer: Self-pay | Admitting: Obstetrics and Gynecology

## 2023-04-15 VITALS — BP 112/72 | HR 75 | Temp 98.1°F | Wt 171.0 lb

## 2023-04-15 DIAGNOSIS — R102 Pelvic and perineal pain: Secondary | ICD-10-CM | POA: Diagnosis not present

## 2023-04-15 DIAGNOSIS — N941 Unspecified dyspareunia: Secondary | ICD-10-CM | POA: Diagnosis not present

## 2023-04-15 DIAGNOSIS — Z113 Encounter for screening for infections with a predominantly sexual mode of transmission: Secondary | ICD-10-CM | POA: Diagnosis not present

## 2023-04-15 DIAGNOSIS — Z2239 Carrier of other specified bacterial diseases: Secondary | ICD-10-CM | POA: Diagnosis not present

## 2023-04-15 DIAGNOSIS — G8929 Other chronic pain: Secondary | ICD-10-CM | POA: Diagnosis not present

## 2023-04-15 NOTE — Progress Notes (Signed)
 48 y.o. [id number] female s/p BTL due to prior ectopic pregnancy x3 (2007-2008) with chronic pelvic pain likely secondary to chronic PID (history of chronic BV, positive M.hominis and Ureaplasma) with associated dyspareunia, menorrhagia, mixed incontinence, OAB, HTN, tobacco use here for vaginitis follow-up. Relationship x7 years.   Pt reports feeling significantly better-denies dyspareunia, urinary symptoms, AUB.  Is continue to take the probiotics.  She has had penetrative intercourse with condoms due to concerns for recurrent infection.  She desires repeat STD screening.  Patient's last menstrual period was 04/12/2023 (approximate).    At prior appt, she noted: "She states that she has severe sharp pain with intercourse. She says that stimulation is not painful but penetration is painful. Missionary is sometimes comfortable, but other positions are not. Pain developed ~3 years ago and has gotten worse.   CD1-3 are heavy, patient reports changing pad ~hourly on these days.  Notes daily RLQ pain rated 6-8/10, sometimes with nausea, worse with intercourse. Pain present for almost 3 years"   Normal CBC March 2024 10/12/22 TVUS: Normal, ET 12mm. 01/13/2023 EMB benign 01/13/2023 positive M hominis 02/26/2023 positive Ureaplasma   Birth control: BTL Last mammogram: 10/16/22 BIRADS 1, density b Sexually active: yes with female partner x7 years, however taking a break from prior relationship, SA with new partner. Smoker: yes, 1/2 ppd  Last PAP:    Component Value Date/Time   DIAGPAP (A) 09/24/2022 1340    - Atypical squamous cells of undetermined significance (ASC-US)   DIAGPAP  10/01/2017 0000    NEGATIVE FOR INTRAEPITHELIAL LESIONS OR MALIGNANCY.   HPVHIGH Negative 09/24/2022 1340   ADEQPAP  09/24/2022 1340    Satisfactory for evaluation; transformation zone component PRESENT.   ADEQPAP  10/01/2017 0000    Satisfactory for evaluation  endocervical/transformation zone component PRESENT.     GYN HISTORY: s/p BTL due to prior ectopic pregnancy x3 (2007-2008) Hx of PID Hx of chlamydia  OB History  Gravida Para Term Preterm AB Living  6 3   3 3   SAB IAB Ectopic Multiple Live Births  3        # Outcome Date GA Lbr Len/2nd Weight Sex Type Anes PTL Lv  6 Para           5 Para           4 Para           3 SAB           2 SAB           1 SAB             Past Medical History:  Diagnosis Date   Abscess of buttock 08/13/2019   Asthma    Bacterial vaginosis    Boil 06/14/2019   BV (bacterial vaginosis) 09/13/2008        Hypertension 05/11/2013   Kidney stone    OVARIAN CYST, LEFT 10/29/2008   Qualifier: Diagnosis of  By: Lanier Prude  MD, Taineisha     Pain in the chest 06/06/2015   Painful lumpy right breast 03/21/2013   Recurrent cold sores 12/05/2015   Recurrent sinusitis 06/06/2015   Sinus congestion 04/25/2018   TUBAL PREGNANCY 09/13/2008   Qualifier: History of  By: Lanier Prude  MD, Taineisha     Vaginal discharge 01/10/2014   Vaginal discharge 01/10/2014    Past Surgical History:  Procedure Laterality Date   CYSTECTOMY     CYSTOSCOPY WITH RETROGRADE PYELOGRAM, URETEROSCOPY AND STENT PLACEMENT Left 02/22/2014  Procedure: CYSTOSCOPY WITH RETROGRADE PYELOGRAM, URETEROSCOPY, STONE BASKET EXTRACTION ;  Surgeon: Kathi Ludwig, MD;  Location: [facility];  Service: Urology;  Laterality: Left;   tailbone cyst     TUBAL LIGATION      Current Outpatient Medications on File Prior to Visit  Medication Sig Dispense Refill   albuterol (VENTOLIN HFA) 108 (90 Base) MCG/ACT inhaler Inhale 2 puffs into the lungs every 6 (six) hours as needed. 18 g 0   cetirizine (ZYRTEC) 10 MG tablet TAKE 1 TABLET(10 MG) BY MOUTH DAILY 90 tablet 1   famotidine (PEPCID) 40 MG tablet Take 1 tablet (40 mg total) by mouth daily. 30 tablet 0   fluticasone (FLONASE) 50 MCG/ACT nasal spray SHAKE LIQUID AND USE 2 SPRAYS IN EACH NOSTRIL DAILY 16 g 12   hydrocortisone (ANUSOL-HC) 2.5 % rectal cream Place 1  Application rectally 2 (two) times daily. 30 g 1   ibuprofen (ADVIL) 600 MG tablet Take 1 tablet (600 mg total) by mouth every 6 (six) hours as needed. 30 tablet 0   Multiple Vitamins-Minerals (ULTRA WOMENS PACK PO) Take by mouth.     NIFEdipine (PROCARDIA-XL/NIFEDICAL-XL) 30 MG 24 hr tablet TAKE 1 TABLET(30 MG) BY MOUTH AT BEDTIME 90 tablet 3   norethindrone (ORTHO MICRONOR) 0.35 MG tablet Take 1 tablet (0.35 mg total) by mouth daily. 28 tablet 11   ondansetron (ZOFRAN) 4 MG tablet Take 1 tablet (4 mg total) by mouth every 8 (eight) hours as needed for nausea or vomiting. 30 tablet 1   No current facility-administered medications on file prior to visit.    Allergies  Allergen Reactions   Flagyl [Metronidazole] Other (See Comments)    Migraine & Nausea   Doxycycline     REACTION: Nausea      PE Today's Vitals   04/15/23 1455  BP: 112/72  Pulse: 75  Temp: 98.1 F (36.7 C)  TempSrc: Oral  SpO2: 97%  Weight: 171 lb (77.6 kg)    Body mass index is 31.28 kg/m.  Physical Exam Vitals reviewed. Exam conducted with a chaperone present.  Constitutional:      General: She is not in acute distress.    Appearance: Normal appearance.  HENT:     Head: Normocephalic and atraumatic.     Nose: Nose normal.  Eyes:     Extraocular Movements: Extraocular movements intact.     Conjunctiva/sclera: Conjunctivae normal.  Pulmonary:     Effort: Pulmonary effort is normal.  Genitourinary:    General: Normal vulva.     Exam position: Lithotomy position.     Vagina: Normal. No vaginal discharge.     Cervix: Normal. No cervical motion tenderness, discharge or lesion.     Uterus: Normal. Not enlarged and not tender.      Adnexa: Right adnexa normal and left adnexa normal.  Musculoskeletal:        General: Normal range of motion.     Cervical back: Normal range of motion.  Neurological:     General: No focal deficit present.     Mental Status: She is alert.  Psychiatric:        Mood and  Affect: Mood normal.        Behavior: Behavior normal.    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 Symptoms have resolved and patient continues to feel well. She presents for follow-up testing for resolution of infection. Planning to complete PFPT, referral previously sent  Orders: -     SureSwab Mycoplasma/Ureaplasma, PCR  Screen for STD (sexually transmitted disease) -     Hepatitis B surface antigen -     Hepatitis C antibody -     RPR -     HIV Antibody (routine testing w rflx) -     SureSwab Advanced Vaginitis Plus,TMA  Dyspareunia, female Assessment & Plan: Resolved    Rosalyn Gess, MD

## 2023-04-16 ENCOUNTER — Encounter: Payer: Self-pay | Admitting: Obstetrics and Gynecology

## 2023-04-16 DIAGNOSIS — G8929 Other chronic pain: Secondary | ICD-10-CM

## 2023-04-16 DIAGNOSIS — N760 Acute vaginitis: Secondary | ICD-10-CM

## 2023-04-16 DIAGNOSIS — Z2239 Carrier of other specified bacterial diseases: Secondary | ICD-10-CM

## 2023-04-16 LAB — SURESWAB® ADVANCED VAGINITIS PLUS,TMA
C. trachomatis RNA, TMA: NOT DETECTED
CANDIDA SPECIES: NOT DETECTED
Candida glabrata: NOT DETECTED
N. gonorrhoeae RNA, TMA: NOT DETECTED
SURESWAB(R) ADV BACTERIAL VAGINOSIS(BV),TMA: POSITIVE — AB
TRICHOMONAS VAGINALIS (TV),TMA: NOT DETECTED

## 2023-04-16 LAB — HIV ANTIBODY (ROUTINE TESTING W REFLEX): HIV 1&2 Ab, 4th Generation: NONREACTIVE

## 2023-04-16 LAB — HEPATITIS C ANTIBODY: Hepatitis C Ab: NONREACTIVE

## 2023-04-16 LAB — HEPATITIS B SURFACE ANTIGEN: Hepatitis B Surface Ag: NONREACTIVE

## 2023-04-16 LAB — RPR: RPR Ser Ql: NONREACTIVE

## 2023-04-21 LAB — SURESWAB MYCOPLASMA/UREAPLASMA, PCR
M. hominis DNA: NOT DETECTED
MYCOPLASMA GENITALIUM, rRNA,TMA: NOT DETECTED
U. parvum DNA: NOT DETECTED
U. urealyticum DNA: DETECTED — AB

## 2023-04-26 NOTE — Assessment & Plan Note (Signed)
 Resolved

## 2023-04-26 NOTE — Assessment & Plan Note (Addendum)
 Initially noted RLQ pain rated 6-8/10 almost daily, sometimes with nausea x3 years Worse with intercourse Discussed clinical presentation not initially consistent with PID, however > pain resolved after treatment of mycoplasma  and ureaplasma suggesting possible chronic pelvic infection. Symptoms have resolved and patient continues to feel well. She presents for follow-up testing for resolution of infection. Planning to complete PFPT, referral previously sent

## 2023-04-27 DIAGNOSIS — R0982 Postnasal drip: Secondary | ICD-10-CM | POA: Diagnosis not present

## 2023-04-27 DIAGNOSIS — J358 Other chronic diseases of tonsils and adenoids: Secondary | ICD-10-CM | POA: Diagnosis not present

## 2023-04-28 NOTE — Telephone Encounter (Signed)
 Pt LVM in triage line requesting treatment for the +ureaplasma seen in her chart since Friday 2/21 per pt and mentioned that provider has said that partner would need to be treated as well. Please advise/respond at your earliest convenience. Thanks.

## 2023-04-28 NOTE — Telephone Encounter (Signed)
 LVMTCB

## 2023-04-29 ENCOUNTER — Encounter: Payer: Self-pay | Admitting: Obstetrics and Gynecology

## 2023-04-29 MED ORDER — LEVOFLOXACIN 750 MG PO TABS: 750.0000 mg | ORAL_TABLET | Freq: Every day | ORAL | 0 refills | Status: AC

## 2023-04-29 NOTE — Telephone Encounter (Addendum)
 Spoke to pt via phone call. Pt was agreeable with recommendations and asked f/u questions in regards to other viral illnesses and immunity. Pt is aware and agreeable to treatment as well as f/u consult with ID for second-line treatment. Received patient's partner's information. JH called in Levaquin 500mg  for 10 days for pt's partner at pharmacy. Instructions were also advised to the pt. Pt will follow-up with any questions. Routing to provider

## 2023-04-29 NOTE — Telephone Encounter (Signed)
 Left message to call [facility] Triage at [phone], option 4.

## 2023-04-29 NOTE — Telephone Encounter (Signed)
 LVMTCB

## 2023-04-29 NOTE — Telephone Encounter (Signed)
 Called patient to discuss results. +BV and Ureaplasma Patient reporting recurrent bladder pain and dyspareunia over the past week.  No discharge. Reviewed option for treatment including doxycycline and Levaquin.  Patient with emesis with doxycycline in the past so we will try Levaquin. Declined treatment for BV at this time as she is using over-the-counter boric acid and is wondering if 1 antibiotic will be enough. Patient also has questions about prolonged antibiotic usage. Recommend ID consultation since patient has failed first-line antibiotics for treatment of Ureaplasma and is not requiring a second line therapy.  Also in the setting of possible chronic pelvic infection.  Patient will also need partner treatment.  Please call patient to obtain partners information for treatment.  Please send Rx for Levaquin 500 mg p.o. every day x10 day, dispense 10 tablets, 0 refills.

## 2023-04-29 NOTE — Telephone Encounter (Signed)
 Call placed to [facility], [phone] Spoke with pharmacist Marylene Land.  Verbal Rx provided for partner,  Levaquin 500 mg PO, every day x10 days; Dispense 10 tablets, 0 refills.   Read back and confirmed.   Encounter closed.

## 2023-04-29 NOTE — Telephone Encounter (Signed)
 Pt returned the call and Lvm in triage line stating that she was hoping that she would get a call back from Dr. Kennith Center herself re: some additional sxs that developed after her recent visit with [facility] that she mentioned would make sense given her recent test results. However, a good time to call the pt back would be btwn 1-2pm due to work.

## 2023-04-30 NOTE — Telephone Encounter (Signed)
 Pt LVM in triage line stating that she would like a call after 230pm today.

## 2023-04-30 NOTE — Telephone Encounter (Signed)
 Spoke w/ pt and has concerns regarding taking the levofloxacin due to the rare but long term potential side effects of tendonitis since she already has issues with her tendons and has to be on her feet for a living. Pt stated that she was also advised by pharmacist that she should not take zofran with levaquin.   Pt stated that she would just like to see ID at this point to see if they can check her body's susceptibility to abxs use since long-term recent abx use and since the first line tx of abx for infection did not work prior to introducing her body to anymore abxs, she wants to make sure that she can take the correct one if possible.   Pt advised that the ID referral has already been sent and it looks like someone from the office has tried to call her, recommended that she call them back to schedule at her earliest convenience.    Msg fwded as Lorain Childes and if anything additional.  Pt voiced her appreciation for the cb.

## 2023-05-20 ENCOUNTER — Other Ambulatory Visit: Payer: Self-pay

## 2023-05-20 ENCOUNTER — Encounter: Payer: Self-pay | Admitting: Internal Medicine

## 2023-05-20 ENCOUNTER — Ambulatory Visit (INDEPENDENT_AMBULATORY_CARE_PROVIDER_SITE_OTHER): Admitting: Internal Medicine

## 2023-05-20 VITALS — BP 161/95 | HR 83 | Temp 98.2°F | Ht 62.75 in | Wt 167.0 lb

## 2023-05-20 DIAGNOSIS — N72 Inflammatory disease of cervix uteri: Secondary | ICD-10-CM

## 2023-05-20 DIAGNOSIS — R102 Pelvic and perineal pain: Secondary | ICD-10-CM | POA: Diagnosis not present

## 2023-05-20 MED ORDER — MOXIFLOXACIN HCL 400 MG PO TABS: 400.0000 mg | ORAL_TABLET | Freq: Every day | ORAL | 0 refills | Status: AC

## 2023-05-20 NOTE — Progress Notes (Signed)
 [facility]  Reason for Consult:pelvic pain Referring Provider: Genesis Kennith Center    Patient Active Problem List   Diagnosis Date Noted   Chronic pelvic pain in female 01/13/2023   Abnormal uterine bleeding (AUB) 12/30/2022   History of ectopic pregnancy 12/30/2022   Screening for cervical cancer 09/24/2022   Screening for colon cancer 09/24/2022   Screening examination for STD (sexually transmitted disease) 09/24/2022   Hot flashes 05/10/2022   Milia of right eyelid 08/27/2021   Viral illness 02/27/2021   Back pain 02/13/2021   Overactive bladder 07/03/2020   Levator spasm 07/03/2020   Dyspareunia, female 07/03/2020   Mixed stress and urge urinary incontinence 04/11/2020   History of COVID-19 03/13/2020   Seasonal allergies 06/14/2019   PTSD (post-traumatic stress disorder) 12/05/2015   External hemorrhoids 08/07/2015   Vaginal discharge 01/10/2014   Hypertension 05/11/2013   Tobacco use disorder 03/21/2013   Asthma 12/23/2009   Migraine headache 09/13/2008   Depression 06/21/2008      HPI: Gail Bass is a 48 y.o. female here for pelvic pain and bacteria ositive on pcr  Has had sx since 2019 (pain with sex, pelvic pain, bladder hyperactivity) She had 3 ectopic pregnancy She has been following gynecology who had extensive w/u for patient = endometriosis r/o'ed and no uterine cancer  She however has had labs suggestive of bv and has several courses of treatment for that with clindamycin. Tried boric acid as well  Given persistent of sx, vaginal swab had been done and at a couple times showed ureoplasma and mycoplasma species  Patient had some improvement for a few days after azith but recurred. She strongly believed these are connected to her pelvic pain  She has been in a relationship for several years. Her boyfriend at some point was also treated presumptively for ngu despite negative testing  She intermittently has  vaginal discharge during bv dx but otherwise chronic pain/active bladder, and progressive dyspareunia  No fever, chill  No douch  She uses sex toys but nothing exotic in terms of cleaning the toys  She denies hx depression or hx sexual abuse  She has had extensive self study about her sx  Her severe chronic sx had returned since valentine day 2025  Patient had seen urology in 2015 for kidney stone required surgery. Since then she has only been seeing incontinence/gyn provider   Review of Systems: ROS All other ros negative      Past Medical History:  Diagnosis Date   Abscess of buttock 08/13/2019   Asthma    Bacterial vaginosis    Boil 06/14/2019   BV (bacterial vaginosis) 09/13/2008        Hypertension 05/11/2013   Kidney stone    OVARIAN CYST, LEFT 10/29/2008   Qualifier: Diagnosis of  By: Lanier Prude  MD, Taineisha     Pain in the chest 06/06/2015   Painful lumpy right breast 03/21/2013   Recurrent cold sores 12/05/2015   Recurrent sinusitis 06/06/2015   Sinus congestion 04/25/2018   TUBAL PREGNANCY 09/13/2008   Qualifier: History of  By: Lanier Prude  MD, Taineisha     Vaginal discharge 01/10/2014   Vaginal discharge 01/10/2014    Social History   Tobacco Use   Smoking status: Every Day    Current packs/day: 0.50    Average packs/day: 0.5 packs/day for 10.5 years (5.2 ttl pk-yrs)    Types: Cigarettes    Start date: 11/19/2012  Passive exposure: Current   Smokeless tobacco: Never   Tobacco comments:    Reports attempting cessation with patches.   Vaping Use   Vaping status: Never Used  Substance Use Topics   Alcohol use: No   Drug use: Yes    Types: Marijuana    Comment: CBD vape    Family History  Problem Relation Age of Onset   Heart Problems Mother    Heart failure Mother    Arrhythmia Father        Pacemakers placed didn't take passed away 3 moths later   Arrhythmia Sister        Pacemaker didn't take to body passed away 3 months ;later   Hypertension  Maternal Aunt    Cervical cancer Maternal Aunt        stage 4 type B   Heart failure Maternal Grandmother    Hypertension Maternal Grandfather    Endometrial cancer Maternal Aunt    Endometriosis Maternal Aunt     Allergies  Allergen Reactions   Flagyl [Metronidazole] Other (See Comments)    Migraine & Nausea   Doxycycline     REACTION: Emesis    OBJECTIVE: Vitals:   05/20/23 1248  BP: (!) 161/95  Pulse: 83  Temp: 98.2 F (36.8 C)  TempSrc: Oral  SpO2: 96%  Weight: 167 lb (75.8 kg)  Height: 5' 2.75" (1.594 m)   Body mass index is 29.82 kg/m.   Physical Exam General/constitutional: no distress, pleasant HEENT: Normocephalic, PER, Conj Clear, EOMI, Oropharynx clear Neck supple CV: rrr no mrg Lungs: clear to auscultation, normal respiratory effort Abd: Soft, Nontender Ext: no edema Skin: No Rash Neuro: nonfocal MSK: no peripheral joint swelling/tenderness/warmth; back spines nontender   Lab: Lab Results  Component Value Date   WBC 9.2 01/13/2023   HGB 15.0 01/13/2023   HCT 43.8 01/13/2023   MCV 95.2 01/13/2023   PLT 288 01/13/2023   Last metabolic panel Lab Results  Component Value Date   GLUCOSE 92 04/28/2022   NA 141 04/28/2022   K 4.2 04/28/2022   CL 103 04/28/2022   CO2 23 04/28/2022   BUN 14 04/28/2022   CREATININE 0.66 04/28/2022   EGFR 110 04/28/2022   CALCIUM 9.7 04/28/2022   PROT 6.6 06/05/2015   ALBUMIN 4.0 06/05/2015   BILITOT 0.5 06/05/2015   ALKPHOS 53 06/05/2015   AST 13 06/05/2015   ALT 25 06/05/2015   ANIONGAP 7 02/22/2014    Microbiology:  Serology:  Imaging:   Assessment/plan: Problem List Items Addressed This Visit   None Visit Diagnoses       Pelvic pain    -  Primary     Cervicitis           48 yo female hx ectopic pregnancy, chronic pelvic pain/overactive bladder, referred by gynecology for evaluation of microbiologic/molecular cervical swab testing showing mycoplasma hominis and ureaplasma  urealyticum  It is rather an unfortunate situation with her symptoms and I appreciate her gynecologist's effort in helping this patient  I reviewed her previous testing and filtered literature with patient. I discussed with her that it is unclear what the molecular finding of those particular species mean and literature had suggested against an association with cervicitis/pelvic infection outside of mycoplasma genitalium. And even in the latter its also unclear  I am not sure what to make of that brief response with azithromycin  She is allergic (severe intolerance of) to doxycycline which is something that seems to bring the best "symptomatic  cure" in studies for NGU with mycoplasma. She also can't take metronidazole and has only used clinda for bv  I discussed the microbiome/normal flora with her and relate that "recurrent nature" of bv possibly due to it being a dynamic microbiome change and she might have higher threshold to treat  She does want to give treatment another try and tolerate levoflox, however wants to try moxi  I think this is reasonable given what she has been through, but I again discussed with her I am not sure if it'll be helpful chronically and that I don't believe the presence of these bacteria (normal flora) is associated with her sx  I advise her after this course of abx can consider alternative medicine such as accupuncture  I also advise seeing urology to assess for overactive bladder  Continue to follow with her gynecologist dr Kennith Center for routine care   F/u with me as needed  01/2023 m hominis dna positive; u parvum/urealyticum and m genitalium negative 04/2023 u urealyticum positive; m hominis, m genitalium negative  Chart forwarded to dr Kennith Center  Follow-up: Return if symptoms worsen or fail to improve.   Raymondo Band, MD [facility] 05/20/2023, 1:12 PM

## 2023-05-20 NOTE — Patient Instructions (Signed)
 Thank you for coming to our clinic   I am sorry this has been such a chronic issue   I am unclear what these bacteria presence mean. They are often considered normal flora, and for a female connection to symptoms of cervicitis is unclear   We can try another course of antibiotics of moxifloxacin, but if limited improvement (duration wise of severity wise) I wouldn't pursue keeping treating your symptoms with antibiotics as likely your symptoms is not infectious in etiology   Please consider going to urology to help with your bladder symptoms  Continue gynecology follow up

## 2023-05-27 ENCOUNTER — Other Ambulatory Visit: Payer: Self-pay | Admitting: Family Medicine

## 2023-05-28 ENCOUNTER — Encounter: Payer: Self-pay | Admitting: Family Medicine

## 2023-06-17 NOTE — Progress Notes (Signed)
   SUBJECTIVE:   CHIEF COMPLAINT / HPI: Refills/BP  HTN On Nifedipine  30 mg daily. BP elevated today and has been slightly elevated over the past couple months.   Patient would also like refills on her medications as well.  PERTINENT  PMH / PSH: HTN, Recurrent cold sores on acyclovir , External Hemorrhoids, Migraines, Menorrhagia, bilateral tubal ligation s/p tubal pregnancy.   OBJECTIVE:   BP 132/84   Pulse 79   Ht 5' 2.75" (1.594 m)   Wt 164 lb 9.6 oz (74.7 kg)   SpO2 98%   BMI 29.39 kg/m   General: Awake and Alert in NAD HEENT: NCAT. Sclera anicteric. No rhinorrhea. Cardiovascular: RRR. No M/R/G Respiratory: CTAB, normal WOB on RA. No wheezing, crackles, rhonchi, or diminished breath sounds. Abdomen: Soft, non-tender, non-distended. Bowel sounds normoactive Extremities: Able to move all extremities. No BLE edema, no deformities or significant joint findings. Skin: Warm and dry. No abrasions or rashes noted. Neuro: A&Ox3. No focal neurological deficits.  ASSESSMENT/PLAN:   Assessment & Plan Hypertension, unspecified type BP elevated to 142/98 rechecked and was 132/84.  - Continue Nifedipine  30 mg daily - Advised smoking cessation Mild intermittent asthma without complication - Refilled Albuterol  inhaler Seasonal allergies - Refilled Zyrtec  10 mg daily External hemorrhoids - Refilled Anusol  rectal cream Recurrent cold sores - Refilled Acyclovir  400 mg BID    Clyda Dark, DO [facility]

## 2023-06-21 ENCOUNTER — Ambulatory Visit: Admitting: Family Medicine

## 2023-06-21 VITALS — BP 132/84 | HR 79 | Ht 62.75 in | Wt 164.6 lb

## 2023-06-21 DIAGNOSIS — K644 Residual hemorrhoidal skin tags: Secondary | ICD-10-CM | POA: Diagnosis not present

## 2023-06-21 DIAGNOSIS — J4520 Mild intermittent asthma, uncomplicated: Secondary | ICD-10-CM

## 2023-06-21 DIAGNOSIS — B001 Herpesviral vesicular dermatitis: Secondary | ICD-10-CM | POA: Diagnosis not present

## 2023-06-21 DIAGNOSIS — I10 Essential (primary) hypertension: Secondary | ICD-10-CM | POA: Diagnosis present

## 2023-06-21 DIAGNOSIS — J302 Other seasonal allergic rhinitis: Secondary | ICD-10-CM | POA: Diagnosis not present

## 2023-06-21 MED ORDER — HYDROCORTISONE (PERIANAL) 2.5 % EX CREA: 1.0000 | TOPICAL_CREAM | Freq: Two times a day (BID) | CUTANEOUS | 1 refills | Status: AC

## 2023-06-21 MED ORDER — CETIRIZINE HCL 10 MG PO TABS: ORAL_TABLET | ORAL | 1 refills | Status: DC

## 2023-06-21 MED ORDER — ACYCLOVIR 400 MG PO TABS: 400.0000 mg | ORAL_TABLET | Freq: Two times a day (BID) | ORAL | 0 refills | Status: DC

## 2023-06-21 MED ORDER — NIFEDIPINE ER OSMOTIC RELEASE 30 MG PO TB24: 30.0000 mg | ORAL_TABLET | Freq: Every day | ORAL | 3 refills | Status: DC

## 2023-06-21 MED ORDER — ALBUTEROL SULFATE HFA 108 (90 BASE) MCG/ACT IN AERS: 2.0000 | INHALATION_SPRAY | Freq: Four times a day (QID) | RESPIRATORY_TRACT | 3 refills | Status: DC | PRN

## 2023-06-21 NOTE — Patient Instructions (Addendum)
 It was great to see you today! Thank you for choosing [facility] for your primary care. Gail Bass was seen for refills/BP.  Today we addressed: Refills sent to pharmacy BP elevated on Nifedipine  likely due to stress and smoking again. Discussed smoking cessation. Will continue Nifedipine  30 mg daily, will titrate as needed.   You should return to our clinic No follow-ups on file. Please arrive 15 minutes before your appointment to ensure smooth check in process.  We appreciate your efforts in making this happen.  Thank you for allowing me to participate in your care, Clyda Dark, DO 06/21/2023, 4:13 PM PGY-1, [facility]

## 2023-06-21 NOTE — Assessment & Plan Note (Signed)
Refilled Albuterol inhaler

## 2023-06-21 NOTE — Assessment & Plan Note (Signed)
 BP elevated to 142/98 rechecked and was 132/84.  - Continue Nifedipine  30 mg daily - Advised smoking cessation

## 2023-06-21 NOTE — Assessment & Plan Note (Signed)
-   Refilled Anusol  rectal cream

## 2023-06-21 NOTE — Assessment & Plan Note (Signed)
Refilled Zyrtec 10mg daily 

## 2023-06-22 ENCOUNTER — Encounter: Payer: Self-pay | Admitting: Podiatry

## 2023-06-22 ENCOUNTER — Ambulatory Visit (INDEPENDENT_AMBULATORY_CARE_PROVIDER_SITE_OTHER)

## 2023-06-22 ENCOUNTER — Ambulatory Visit (INDEPENDENT_AMBULATORY_CARE_PROVIDER_SITE_OTHER): Admitting: Podiatry

## 2023-06-22 DIAGNOSIS — M722 Plantar fascial fibromatosis: Secondary | ICD-10-CM | POA: Diagnosis not present

## 2023-06-22 DIAGNOSIS — M778 Other enthesopathies, not elsewhere classified: Secondary | ICD-10-CM

## 2023-06-22 DIAGNOSIS — M7661 Achilles tendinitis, right leg: Secondary | ICD-10-CM

## 2023-06-22 MED ORDER — MELOXICAM 15 MG PO TABS: 15.0000 mg | ORAL_TABLET | Freq: Every day | ORAL | 3 refills | Status: AC

## 2023-06-22 NOTE — Progress Notes (Signed)
 Subjective:  Patient ID: Gail Bass, female    DOB: [birth year],  MRN: [id number]  Chief Complaint  Patient presents with   Foot Pain    Pt is here for bilateral foot and heel pain states the pain has been there for over 2 months, has used OTC meds, changing shoes,icing it insoles and compression sleeve sock, that temporary works for the pain. States she walks over 3500-7000 steps a day at work.    Discussed the use of AI scribe software for clinical note transcription with the patient, who gave verbal consent to proceed.  History of Present Illness Gail Bass is a 48 year old female who presents with right foot pain.  She has been experiencing excruciating right foot pain for the last couple of months, which began after she injured her foot by hitting it hard on the side of a truck during her work at [employer] peak season in October, November, and December. The pain is primarily located at the back of the foot and is associated with swelling. The severity of the pain has caused significant frustration.  The pain is mostly in the right foot, although the left foot occasionally becomes sore, likely due to compensatory pressure from walking. 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  She has tried various treatments including ibuprofen , different insoles, and a compression sleeve, but none have provided relief. She also uses a Scientist, clinical (histocompatibility and immunogenetics) gun and has been sleeping with her foot propped up on a pillow. She has been icing the foot and massaging it, but the pain persists, especially after workdays.  She recently completed a course of moxifloxacin  for antibiotic-resistant bacteria following an endometrial uterine biopsy. No bleeding issues have been noted and she has not experienced any side effects from the antibiotic.  In her family history, her grandmother had heel  spurs, which prompted a family member to suggest she get checked for similar issues.      Objective:    Physical Exam VASCULAR: DP and PT pulse palpable. Foot is warm and well-perfused. Capillary fill time is brisk. DERMATOLOGIC: Normal skin turgor, texture, and temperature. No open lesions, rashes, or ulcerations. NEUROLOGIC: Normal sensation to light touch and pressure. No paresthesias. ORTHOPEDIC: Sharp, severe pain on palpation to the plantar medial band at the insertion of the plantar fascia on the right foot, less so on the left foot. Mild pain at the insertion of the Achilles on the right. Mild gastrocnemius equinus. Smooth pain-free range of motion of all examined joints. No ecchymosis or bruising. No gross deformity.   No images are attached to the encounter.    Results Procedure: Corticosteroid injection Description: The right heel was prepped with alcohol and ethyl chloride spray was used as a topical anesthetic. The insertion of the medial band of the plantar fascia was injected with 20 mg of Kenalog , 4 mg of dexamethasone , and 5 mg of Marcaine. The patient tolerated the procedure well. Informed Consent: Consent and a discussion of the risks, benefits, and alternatives were provided.  RADIOLOGY Foot X-ray: No acute fracture or stress fracture. Small plantar calcaneal heel spur. (06/22/2023)     Assessment:   1. Achilles tendinitis, right leg   2. Plantar fasciitis      Plan:  Patient was evaluated and treated and all questions answered.  Assessment and Plan Assessment & Plan Plantar fasciitis, right foot Chronic plantar fasciitis in  the right foot with sharp, severe pain on palpation to the plantar medial band at the insertion of the plantar fascia. Exacerbated by daily activities and prolonged standing, leading to persistent inflammation and pain. Unresponsive to ibuprofen , insoles, and a compression sleeve. Radiographs show no acute or stress fracture. Informed  consent obtained for corticosteroid injection, with discussion of risks, potential side effects, and benefits of reducing inflammation and pain. - Prescribe meloxicam , a prescription-strength anti-inflammatory, to be taken once daily in the morning. - Administer a corticosteroid injection into the right heel to reduce inflammation. - Recommend exercises to be performed twice daily to alleviate symptoms. - Advise using a frozen water bottle to ice and massage the heel and arch. - Instruct to continue using a massager gun for relief. - Suggest elevating the foot and applying ice after work.  Achilles tendinitis, right foot Mild pain at the insertion of the Achilles tendon on the right foot, contributing to overall discomfort. Less severe than plantar fasciitis but requires attention to prevent worsening. - Include Achilles tendon exercises in the prescribed exercise regimen.     Follow-up Follow-up to assess progress and response to treatment. - Schedule follow-up appointment in six weeks to evaluate improvement and adjust treatment as necessary.      Return in about 6 weeks (around 08/03/2023) for recheck plantar fasciitis.

## 2023-06-22 NOTE — Patient Instructions (Addendum)
 VISIT SUMMARY:  Today, you were seen for right foot pain that has been troubling you for the past few months. The pain started after you injured your foot at work and has been persistent despite trying various treatments. We discussed your symptoms, reviewed your treatment history, and conducted a physical examination and radiographs to determine the cause of your pain.  YOUR PLAN:  -PLANTAR FASCIITIS, RIGHT FOOT: Plantar fasciitis is inflammation of the tissue along the bottom of your foot that connects your heel bone to your toes. To help reduce the inflammation and pain, you received a corticosteroid injection in your right heel. You are also prescribed meloxicam , an anti-inflammatory medication, to be taken once daily in the morning. Additionally, you should perform specific exercises twice daily, use a frozen water bottle to ice and massage your heel and arch, continue using a massager gun, and elevate and ice your foot after work.  -ACHILLES TENDINITIS, RIGHT FOOT: Achilles tendinitis is inflammation of the tendon that connects your calf muscles to your heel bone. This condition is contributing to your overall foot discomfort. You should include Achilles tendon exercises in your daily exercise regimen to prevent the condition from worsening.  -HEEL SPUR, RIGHT FOOT: A heel spur is a bony growth on the underside of your heel bone. Although a small heel spur was found on your right foot, it is not currently causing pain and does not require surgery at this time. Surgery will only be considered if your symptoms do not improve over the next few months.  INSTRUCTIONS:  Please schedule a follow-up appointment in six weeks to assess your progress and response to the treatment. We will evaluate your improvement and adjust the treatment plan as necessary.   Plantar Fasciitis (Heel Spur Syndrome) with Rehab The plantar fascia is a fibrous, ligament-like, soft-tissue structure that spans the bottom of  the foot. Plantar fasciitis is a condition that causes pain in the foot due to inflammation of the tissue. SYMPTOMS  Pain and tenderness on the underneath side of the foot. Pain that worsens with standing or walking. CAUSES  Plantar fasciitis is caused by irritation and injury to the plantar fascia on the underneath side of the foot. Common mechanisms of injury include: Direct trauma to bottom of the foot. Damage to a small nerve that runs under the foot where the main fascia attaches to the heel bone. Stress placed on the plantar fascia due to bone spurs. RISK INCREASES WITH:  Activities that place stress on the plantar fascia (running, jumping, pivoting, or cutting). Poor strength and flexibility. Improperly fitted shoes. Tight calf muscles. Flat feet. Failure to warm-up properly before activity. Obesity. PREVENTION Warm up and stretch properly before activity. Allow for adequate recovery between workouts. Maintain physical fitness: Strength, flexibility, and endurance. Cardiovascular fitness. Maintain a health body weight. Avoid stress on the plantar fascia. Wear properly fitted shoes, including arch supports for individuals who have flat feet.  PROGNOSIS  If treated properly, then the symptoms of plantar fasciitis usually resolve without surgery. However, occasionally surgery is necessary.  RELATED COMPLICATIONS  Recurrent symptoms that may result in a chronic condition. Problems of the lower back that are caused by compensating for the injury, such as limping. Pain or weakness of the foot during push-off following surgery. Chronic inflammation, scarring, and partial or complete fascia tear, occurring more often from repeated injections.  TREATMENT  Treatment initially involves the use of ice and medication to help reduce pain and inflammation. The use of strengthening and  stretching exercises may help reduce pain with activity, especially stretches of the Achilles tendon.  These exercises may be performed at home or with a therapist. Your caregiver may recommend that you use heel cups of arch supports to help reduce stress on the plantar fascia. Occasionally, corticosteroid injections are given to reduce inflammation. If symptoms persist for greater than 6 months despite non-surgical (conservative), then surgery may be recommended.   MEDICATION  If pain medication is necessary, then nonsteroidal anti-inflammatory medications, such as aspirin and ibuprofen , or other minor pain relievers, such as acetaminophen , are often recommended. Do not take pain medication within 7 days before surgery. Prescription pain relievers may be given if deemed necessary by your caregiver. Use only as directed and only as much as you need. Corticosteroid injections may be given by your caregiver. These injections should be reserved for the most serious cases, because they may only be given a certain number of times.  HEAT AND COLD Cold treatment (icing) relieves pain and reduces inflammation. Cold treatment should be applied for 10 to 15 minutes every 2 to 3 hours for inflammation and pain and immediately after any activity that aggravates your symptoms. Use ice packs or massage the area with a piece of ice (ice massage). Heat treatment may be used prior to performing the stretching and strengthening activities prescribed by your caregiver, physical therapist, or athletic trainer. Use a heat pack or soak the injury in warm water.  SEEK IMMEDIATE MEDICAL CARE IF: Treatment seems to offer no benefit, or the condition worsens. Any medications produce adverse side effects.  EXERCISES- RANGE OF MOTION (ROM) AND STRETCHING EXERCISES - Plantar Fasciitis (Heel Spur Syndrome) These exercises may help you when beginning to rehabilitate your injury. Your symptoms may resolve with or without further involvement from your physician, physical therapist or athletic trainer. While completing these  exercises, remember:  Restoring tissue flexibility helps normal motion to return to the joints. This allows healthier, less painful movement and activity. An effective stretch should be held for at least 30 seconds. A stretch should never be painful. You should only feel a gentle lengthening or release in the stretched tissue.  RANGE OF MOTION - Toe Extension, Flexion Sit with your right / left leg crossed over your opposite knee. Grasp your toes and gently pull them back toward the top of your foot. You should feel a stretch on the bottom of your toes and/or foot. Hold this stretch for 10 seconds. Now, gently pull your toes toward the bottom of your foot. You should feel a stretch on the top of your toes and or foot. Hold this stretch for 10 seconds. Repeat  times. Complete this stretch 3 times per day.   RANGE OF MOTION - Ankle Dorsiflexion, Active Assisted Remove shoes and sit on a chair that is preferably not on a carpeted surface. Place right / left foot under knee. Extend your opposite leg for support. Keeping your heel down, slide your right / left foot back toward the chair until you feel a stretch at your ankle or calf. If you do not feel a stretch, slide your bottom forward to the edge of the chair, while still keeping your heel down. Hold this stretch for 10 seconds. Repeat 3 times. Complete this stretch 2 times per day.   STRETCH  Gastroc, Standing Place hands on wall. Extend right / left leg, keeping the front knee somewhat bent. Slightly point your toes inward on your back foot. Keeping your right / left heel  on the floor and your knee straight, shift your weight toward the wall, not allowing your back to arch. You should feel a gentle stretch in the right / left calf. Hold this position for 10 seconds. Repeat 3 times. Complete this stretch 2 times per day.  STRETCH  Soleus, Standing Place hands on wall. Extend right / left leg, keeping the other knee somewhat  bent. Slightly point your toes inward on your back foot. Keep your right / left heel on the floor, bend your back knee, and slightly shift your weight over the back leg so that you feel a gentle stretch deep in your back calf. Hold this position for 10 seconds. Repeat 3 times. Complete this stretch 2 times per day.  STRETCH  Gastrocsoleus, Standing  Note: This exercise can place a lot of stress on your foot and ankle. Please complete this exercise only if specifically instructed by your caregiver.  Place the ball of your right / left foot on a step, keeping your other foot firmly on the same step. Hold on to the wall or a rail for balance. Slowly lift your other foot, allowing your body weight to press your heel down over the edge of the step. You should feel a stretch in your right / left calf. Hold this position for 10 seconds. Repeat this exercise with a slight bend in your right / left knee. Repeat 3 times. Complete this stretch 2 times per day.   STRENGTHENING EXERCISES - Plantar Fasciitis (Heel Spur Syndrome)  These exercises may help you when beginning to rehabilitate your injury. They may resolve your symptoms with or without further involvement from your physician, physical therapist or athletic trainer. While completing these exercises, remember:  Muscles can gain both the endurance and the strength needed for everyday activities through controlled exercises. Complete these exercises as instructed by your physician, physical therapist or athletic trainer. Progress the resistance and repetitions only as guided.  STRENGTH - Towel Curls Sit in a chair positioned on a non-carpeted surface. Place your foot on a towel, keeping your heel on the floor. Pull the towel toward your heel by only curling your toes. Keep your heel on the floor. Repeat 3 times. Complete this exercise 2 times per day.  STRENGTH - Ankle Inversion Secure one end of a rubber exercise band/tubing to a fixed object  (table, pole). Loop the other end around your foot just before your toes. Place your fists between your knees. This will focus your strengthening at your ankle. Slowly, pull your big toe up and in, making sure the band/tubing is positioned to resist the entire motion. Hold this position for 10 seconds. Have your muscles resist the band/tubing as it slowly pulls your foot back to the starting position. Repeat 3 times. Complete this exercises 2 times per day.  Document Released: 02/16/2005 Document Revised: 05/11/2011 Document Reviewed: 05/31/2008 [facility] Patient Information 2014 [place], [place].

## 2023-07-23 ENCOUNTER — Other Ambulatory Visit: Payer: Self-pay | Admitting: Family Medicine

## 2023-07-23 ENCOUNTER — Telehealth: Payer: Self-pay

## 2023-07-23 DIAGNOSIS — K649 Unspecified hemorrhoids: Secondary | ICD-10-CM

## 2023-07-23 NOTE — Telephone Encounter (Signed)
 Pt calling asking for the referral to GI be resent. The previous referral has expired and she is still having problems with her stomach.  Please advise if referral is being placed or if patient needs to come into the office to discuss.  Thank you! Gail Bass

## 2023-08-03 ENCOUNTER — Ambulatory Visit: Admitting: Podiatry

## 2023-08-03 ENCOUNTER — Other Ambulatory Visit: Payer: Self-pay | Admitting: Family Medicine

## 2023-08-03 DIAGNOSIS — I10 Essential (primary) hypertension: Secondary | ICD-10-CM

## 2023-08-29 NOTE — Progress Notes (Unsigned)
    SUBJECTIVE:   CHIEF COMPLAINT / HPI:  BP *** BV?  PERTINENT  PMH / PSH: HTN, Recurrent cold sores on acyclovir , External Hemorrhoids, Migraines, Menorrhagia, bilateral tubal ligation s/p tubal pregnancy  OBJECTIVE:   There were no vitals taken for this visit.  General: Awake and Alert in NAD HEENT: NCAT. Sclera anicteric. No rhinorrhea. Cardiovascular: RRR. No M/R/G Respiratory: CTAB, normal WOB on RA. No wheezing, crackles, rhonchi, or diminished breath sounds. Abdomen: Soft, non-tender, non-distended. Bowel sounds normoactive/hypoactive/hyperactive. *** Extremities: Able to move all extremities. No BLE edema, no deformities or significant joint findings. Skin: Warm and dry. No abrasions or rashes noted. Neuro: A&Ox***. No focal neurological deficits.  ASSESSMENT/PLAN:   Assessment & Plan      Kathrine Melena, DO [facility]

## 2023-08-31 ENCOUNTER — Ambulatory Visit: Payer: Self-pay | Admitting: Family Medicine

## 2023-08-31 ENCOUNTER — Encounter: Payer: Self-pay | Admitting: Podiatry

## 2023-08-31 ENCOUNTER — Ambulatory Visit: Admitting: Family Medicine

## 2023-08-31 ENCOUNTER — Encounter: Payer: Self-pay | Admitting: Family Medicine

## 2023-08-31 ENCOUNTER — Ambulatory Visit (INDEPENDENT_AMBULATORY_CARE_PROVIDER_SITE_OTHER): Admitting: Podiatry

## 2023-08-31 VITALS — BP 138/82 | HR 97 | Ht 63.0 in | Wt 163.0 lb

## 2023-08-31 VITALS — Ht 62.75 in | Wt 165.0 lb

## 2023-08-31 DIAGNOSIS — N760 Acute vaginitis: Secondary | ICD-10-CM

## 2023-08-31 DIAGNOSIS — I10 Essential (primary) hypertension: Secondary | ICD-10-CM | POA: Diagnosis not present

## 2023-08-31 DIAGNOSIS — M722 Plantar fascial fibromatosis: Secondary | ICD-10-CM | POA: Diagnosis not present

## 2023-08-31 DIAGNOSIS — B9689 Other specified bacterial agents as the cause of diseases classified elsewhere: Secondary | ICD-10-CM | POA: Diagnosis not present

## 2023-08-31 DIAGNOSIS — M7661 Achilles tendinitis, right leg: Secondary | ICD-10-CM | POA: Diagnosis not present

## 2023-08-31 DIAGNOSIS — Z2239 Carrier of other specified bacterial diseases: Secondary | ICD-10-CM | POA: Diagnosis not present

## 2023-08-31 LAB — POCT WET PREP (WET MOUNT)
Clue Cells Wet Prep Whiff POC: NEGATIVE
Trichomonas Wet Prep HPF POC: ABSENT
WBC, Wet Prep HPF POC: NONE SEEN

## 2023-08-31 MED ORDER — NIFEDIPINE ER OSMOTIC RELEASE 30 MG PO TB24: 30.0000 mg | ORAL_TABLET | Freq: Every day | ORAL | 3 refills | Status: AC

## 2023-08-31 NOTE — Assessment & Plan Note (Signed)
 BP stable at 138/82 today.  Refilled Nifedipine  30 mg daily for a year.

## 2023-08-31 NOTE — Progress Notes (Signed)
  Subjective:  Patient ID: Gail Bass, female    DOB: 10-28-1975,  MRN: [id number]  Chief Complaint  Patient presents with   Plantar Fasciitis    Achilles tendinitis, right leg patient states foot is feeling slightly better but not much change overall with stretches and ice bottle...would like another injection    Discussed the use of AI scribe software for clinical note transcription with the patient, who gave verbal consent to proceed.  History of Present Illness Had some improvement after the last injection but still quite painful for her overall.      Objective:    Physical Exam VASCULAR: DP and PT pulse palpable. Foot is warm and well-perfused. Capillary fill time is brisk. DERMATOLOGIC: Normal skin turgor, texture, and temperature. No open lesions, rashes, or ulcerations. NEUROLOGIC: Normal sensation to light touch and pressure. No paresthesias. ORTHOPEDIC: Sharp pain on palpation of plantar medial band of plantar fascia   No images are attached to the encounter.    Results Procedure: Corticosteroid injection Description: The right heel was prepped with alcohol and ethyl chloride spray was used as a topical anesthetic. The insertion of the medial band of the plantar fascia was injected with 20 mg of Kenalog , 4 mg of dexamethasone , and 5 mg of Marcaine. The patient tolerated the procedure well. Informed Consent: Consent and a discussion of the risks, benefits, and alternatives were provided.  RADIOLOGY Foot X-ray: No acute fracture or stress fracture. Small plantar calcaneal heel spur. (06/22/2023)     Assessment:   1. Plantar fasciitis   2. Achilles tendinitis, right leg      Plan:  Patient was evaluated and treated and all questions answered.  Assessment and Plan Assessment & Plan Plantar fasciitis, right foot Has had some improvement with 1 injection.  Recommended formal physical therapy as well.  Referral placed.  Repeat injection performed as noted  above.  Continue home therapy plan at home     Follow-up Follow-up to assess progress and response to treatment. - Schedule follow-up appointment in six weeks to evaluate improvement and adjust treatment as necessary.      Return in about 6 weeks (around 10/12/2023) for recheck plantar fasciitis.

## 2023-08-31 NOTE — Patient Instructions (Signed)
 It was great to see you today! Thank you for choosing [facility] for your primary care. Gail Bass was seen for BP and BV/Ureaplasma.  Today we addressed: BP - refilled Nifedipine  BV/Ureaplasma - swab today for BV and checking culture of ureaplasma.  Will follow-up with results.  If positive test results, please follow-up with Dr. Dallie since she previously treated this and referred you to [facility].  We are checking some labs today. I will send you a MyChart message with your results, per your preference. If you do not hear about your labs in the next 2 weeks, please call the office.  You should return to our clinic No follow-ups on file. Please arrive 15 minutes before your appointment to ensure smooth check in process.  We appreciate your efforts in making this happen.  Thank you for allowing me to participate in your care, Kathrine Melena, DO 08/31/2023, 11:36 AM PGY-2, [facility]

## 2023-08-31 NOTE — Patient Instructions (Signed)
Call to schedule physical therapy: [facility] [place] [address]  [phone]  

## 2023-09-06 ENCOUNTER — Encounter: Payer: Self-pay | Admitting: Gastroenterology

## 2023-09-07 LAB — MYCOPLASMA / UREAPLASMA CULTURE

## 2023-09-10 ENCOUNTER — Telehealth: Payer: Self-pay | Admitting: *Deleted

## 2023-09-10 NOTE — Telephone Encounter (Signed)
 Spoke with patient.   Patient f/u with infectious disease as referred and treated with antibiotic. Patient followed abx with the suggestions provided by Dr. Dallie. Recently seen by PCP, is happy to report BV, ureaplasma and mycoplasma all negative. Patient is very appreciative for Dr. Dallie continued support during her journey to find treatment. Patient just wanted to share this news. Patient is aware to call if she has any questions/concerns.   Routing to provider for final review. Patient is agreeable to disposition. Will close encounter.

## 2023-09-10 NOTE — Telephone Encounter (Signed)
-----   Message from Vera LULLA Pa sent at 09/10/2023 10:14 AM EDT ----- Regarding: FW: Condition update Please contact patient to inquire about symptoms. ----- Message ----- From: Buren Bolt Sent: 09/07/2023   2:42 PM EDT To: Vera Pa LULLA, MD Subject: Condition update                               Gail Bass would like to update you on her condition and where she is at now. She could not get back with you via mychart so I am initiating a message.  Please reach out to her via mychart

## 2023-09-15 NOTE — Therapy (Signed)
 OUTPATIENT PHYSICAL THERAPY LOWER EXTREMITY EVALUATION   Patient Name: Gail Bass MRN: [id number] DOB:09-Jul-1975, 48 y.o., female Today's Date: 09/16/2023  END OF SESSION:  PT End of Session - 09/16/23 1509     Visit Number 1    Number of Visits 16    Date for PT Re-Evaluation 11/11/23    Authorization Type [employer] MCD    PT Start Time 1547    PT Stop Time 1633    PT Time Calculation (min) 46 min    Activity Tolerance Patient tolerated treatment well;Patient limited by pain    Behavior During Therapy [facility] for tasks assessed/performed          Past Medical History:  Diagnosis Date   Abscess of buttock 08/13/2019   Asthma    Bacterial vaginosis    Boil 06/14/2019   BV (bacterial vaginosis) 09/13/2008        Hypertension 05/11/2013   Kidney stone    OVARIAN CYST, LEFT 10/29/2008   Qualifier: Diagnosis of  By: Lucille  MD, Taineisha     Pain in the chest 06/06/2015   Painful lumpy right breast 03/21/2013   Recurrent cold sores 12/05/2015   Recurrent sinusitis 06/06/2015   Sinus congestion 04/25/2018   TUBAL PREGNANCY 09/13/2008   Qualifier: History of  By: Lucille  MD, Taineisha     Vaginal discharge 01/10/2014   Vaginal discharge 01/10/2014   Past Surgical History:  Procedure Laterality Date   CYSTECTOMY     CYSTOSCOPY WITH RETROGRADE PYELOGRAM, URETEROSCOPY AND STENT PLACEMENT Left 02/22/2014   Procedure: CYSTOSCOPY WITH RETROGRADE PYELOGRAM, URETEROSCOPY, STONE BASKET EXTRACTION ;  Surgeon: Arlena LILLETTE Gal, MD;  Location: [facility];  Service: Urology;  Laterality: Left;   tailbone cyst     TUBAL LIGATION     Patient Active Problem List   Diagnosis Date Noted   Chronic pelvic pain in female 01/13/2023   Abnormal uterine bleeding (AUB) 12/30/2022   History of ectopic pregnancy 12/30/2022   Screening for cervical cancer 09/24/2022   Screening for colon cancer 09/24/2022   Screening examination for STD (sexually transmitted disease) 09/24/2022   Hot flashes 05/10/2022    Milia of right eyelid 08/27/2021   Viral illness 02/27/2021   Back pain 02/13/2021   Overactive bladder 07/03/2020   Dyspareunia, female 07/03/2020   Mixed stress and urge urinary incontinence 04/11/2020   History of COVID-19 03/13/2020   Seasonal allergies 06/14/2019   PTSD (post-traumatic stress disorder) 12/05/2015   External hemorrhoids 08/07/2015   Vaginal discharge 01/10/2014   Hypertension 05/11/2013   Tobacco use disorder 03/21/2013   Asthma 12/23/2009   Migraine headache 09/13/2008   Depression 06/21/2008    PCP: Janna Ferrier, DO   REFERRING PROVIDER: Silva Juliene SAUNDERS, DPM   REFERRING DIAG:  M72.2 (ICD-10-CM) - Plantar fasciitis  M76.61 (ICD-10-CM) - Achilles tendinitis, right leg  Evaluate and treat for 1-2 sessions / week for 4-6 weeks or at therapist's discretion. Patient has chronic plantar fasciitis, would like to include ROM, strengthening, stability, and manual therapy, dry needling if indicated. Modalities PRN at therapist's discretion.   THERAPY DIAG:  Pain of right heel - Plan: PT plan of care cert/re-cert  Pain in right ankle and joints of right foot - Plan: PT plan of care cert/re-cert  Muscle weakness (generalized) - Plan: PT plan of care cert/re-cert  Difficulty in walking, not elsewhere classified - Plan: PT plan of care cert/re-cert  Rationale for Evaluation and Treatment: Rehabilitation  ONSET DATE: October 2024  SUBJECTIVE:  SUBJECTIVE STATEMENT: I work in [employer] at [employer].  I started having pain in my foot October 2024. 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 I started walking not normally and using the outside of my foot. I used to have a high arch and used to wear high heels but I never do now.  I had a cortisone injection 2 weeks ago. After shot I can stand for 3 hours but before I felt pain right when I get up in the morning and I put my  feet on the floor. I walk about 6000 steps a day when I go back to work.  Right now I am doing about 3000. I grew up in foster care and I am used to taking care of myself  PERTINENT HISTORY: Asthma, HTN  PAIN:  Are you having pain? Yes: NPRS scale: at rest ache 1/10 and at worst 10/10 Pain location: R heel and  lateral edge of foot over 5th metatarsal and at insertion of Plantar fascia, also over insertional achilles tendon. Pain description: achy, stabbing Aggravating factors: ,standing too long more than 15 minutes.transition from sitting too long,can't exercise, stairs Relieving factors: cortisone shot  PRECAUTIONS:  : Flagyl  [Metronidazole ]  Not Specified: Doxycycline       RED FLAGS: None   WEIGHT BEARING RESTRICTIONS: No  FALLS:  Has patient fallen in last 6 months? No  LIVING ENVIRONMENT: Lives with: lives with their family Lives in: House/apartment Stairs: definite use of hands with stairs  pt tries to avoid takes one step at a time Has following equipment at home: None  OCCUPATION: Engineer, petroleum at [employer]  PLOF: Independent  PATIENT GOALS: improve my pain and ability to stand for my work at [employer] at [employer] MD VISIT: tbd  OBJECTIVE:  Note: Objective measures were completed at Evaluation unless otherwise noted.  DIAGNOSTIC FINDINGS: RADIOLOGY Foot X-ray: No acute fracture or stress fracture. Small plantar calcaneal heel spur. (06/22/2023)  PATIENT SURVEYS:  LEFS  Extreme difficulty/unable (0), Quite a bit of difficulty (1), Moderate difficulty (2), Little difficulty (3), No difficulty (4) Survey date:  09-16-23  Any of your usual work, housework or school activities      3  2. Usual hobbies, recreational or sporting activities      3  3. Getting into/out of the bath       2  4. Walking between rooms       3  5. Putting on socks/shoes        4  6. Squatting         2  7. Lifting an object, like a bag of groceries from the  floor        2  8. Performing light activities around your home          3  9. Performing heavy activities around your home        2  10. Getting into/out of a car         2  11. Walking 2 blocks         3  12. Walking 1 mile         2  13. Going up/down 10 stairs (1 flight)          2  14. Standing for 1 hour  2  15.  sitting for 1 hour        3  16. Running on even ground        0  17. Running on uneven ground         0  18. Making sharp turns while running fast         1  19. Hopping         1  20. Rolling over in bed       4  Score total:  38/80     COGNITION: Overall cognitive status: Within functional limits for tasks assessed     SENSATION: WFL  EDEMA:  Circumferential: R and L both 46 cm   MUSCLE LENGTH: Hamstrings: bil tightness R > L   POSTURE: rounded shoulders, forward head, weight shift left, and Pt with increased pronation of right foot  PALPATION: TTP over heel and insertional achilles and plantar fascia insertion, some tenderness over lateral foot  LOWER EXTREMITY ROM:  Active ROM Right eval Left eval  Hip flexion    Hip extension    Hip abduction    Hip adduction    Hip internal rotation    Hip external rotation    Knee flexion    Knee extension 128 126  Ankle dorsiflexion 8 14  Ankle plantarflexion 39 45  Ankle inversion 34 42  Ankle eversion 15 27   (Blank rows = not tested)  LOWER EXTREMITY MMT:  MMT Right eval Left eval  Hip flexion 4+ 4+  Hip extension 4+ 4+  Hip abduction 4 4  Hip adduction    Hip internal rotation    Hip external rotation    Knee flexion 4+ 4+  Knee extension 4 4+  Ankle dorsiflexion    Ankle plantarflexion 7/25 25/25  Ankle inversion 4- 4+  Ankle eversion     (Blank rows = not tested)  LOWER EXTREMITY SPECIAL TESTS:  NT  FUNCTIONAL TESTS:  5 times sit to stand: 14.59 6 minute walk test: 922 .4 ft SL right 7 sec and Left   30 sec   GAIT: Distance walked: 922.4 ft ( Norm for age   [phone] ft) Assistive device utilized: None Level of assistance: Complete Independence Comments: Pt with decreased stride length and antalgic gait right and collapses medial arch in gait.                                                                                                                                TREATMENT DATE: EVAL, manual and HEP   Self care for timeline of healing and shoe selection and explanation of plantar fasciitis and achilles tendonitis   PATIENT EDUCATION:  Education details: eval issue HEP, explanation of findings and timeline of healing Person educated: Patient Education method: Explanation, Demonstration, Tactile cues, Verbal cues, and Handouts Education comprehension: verbalized understanding, returned demonstration, verbal cues required, tactile cues required, and needs further education  HOME EXERCISE PROGRAM: Access Code: [email or web address] URL: [email or web address] Date: 09/16/2023 Prepared by: Graydon Dingwall  Program Notes Toe Yoga  Exercises - Seated Arch Lifts  - 1 x daily - 7 x weekly - 3 sets - 10 reps - Seated Plantar Fascia Stretch  - 1 x daily - 7 x weekly - 3 sets - 10 reps - Standing heel raise towel under Big toe  - 1 x daily - 7 x weekly - 3 sets - 10 reps  ASSESSMENT:  CLINICAL IMPRESSION: Patient is a 48 y.o. female who was seen today for physical therapy evaluation and treatment for Right plantar fasciitis and achilles tendonitis. 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  Pt has chronic condition and would benefit from skilled PT to improve impairments and access knowledge for pain management and strengthening and improving for more efficient gait.  OBJECTIVE IMPAIRMENTS: decreased activity tolerance, decreased knowledge of condition, decreased mobility, difficulty walking, decreased ROM, decreased strength, increased fascial restrictions, improper body mechanics,  and pain.   ACTIVITY LIMITATIONS: bending, standing, squatting, stairs, transfers, locomotion level, and caring for others  PARTICIPATION LIMITATIONS: meal prep, cleaning, laundry, driving, and occupation  PERSONAL FACTORS: Time since onset of injury/illness/exacerbation are also affecting patient's functional outcome.   REHAB POTENTIAL: Good  CLINICAL DECISION MAKING: Stable/uncomplicated  EVALUATION COMPLEXITY: Low   GOALS: Goals reviewed with patient? Yes  SHORT TERM GOALS: Target date: 10-14-23 Pt will be independent with initial HEP Baseline:no knowledge Goal status: INITIAL  2.  Pt pain will reduce from 10/10 to 6/10 functional activities Baseline: 10/10 with functional activities Goal status: INITIAL  3.  Pt will be able to perform standing SL heel raise on RT 15/25 x Baseline: 7 x on eval with 10/10 pain Goal status: INITIAL  4.  Pt will improve DF to at least 12 degrees to show improvement in flexibilty Baseline: 8 degrees at eval Goal status: INITIAL    LONG TERM GOALS: Target date: 11-11-23  Pt will be independent with advanced HEP  Baseline: no knowledge Goal status: INITIAL  2.  Pt will be able to tolerate 6 min walk test within normal limits (at least 1635 ft to 1980 ft) Baseline: 922.4 ft Goal status: INITIAL  3.  Pt will be able to negotiate steps without exacerbation of pain greater than 3/10  Baseline: Pt avoids steps due to pain Goal status: INITIAL  4.  LEFS score will improve by at least 12 ponts MCID to  50 /80 to show functional improvement Baseline: 38/80 Goal status: INITIAL  5.  Increase R ankle gross strength to >/= 4+/5 with </=3/10 max pain during testing to demo improvement in ankle strength/ stability.  Baseline: See AROM chart Goal status: INITIAL  6.  Pt will be more aware of her gait and bearing weight through her 1st ray/great toe ball of  right foot Baseline: Pt tends to collapse medial arch in gait Goal status:  INITIAL  7. Pt will be able to stand for greater than 4 hours in order to return to work  Baseline:  Pain with 15 minutes of standing  Goal status INITIAL   PLAN:  PT FREQUENCY: 1-2x/week  PT DURATION: 8 weeks  PLANNED INTERVENTIONS: 97164- PT Re-evaluation, 97750- Physical Performance Testing, 97110-Therapeutic exercises, 97530- Therapeutic activity, [id number]- Neuromuscular re-education, 97535- Self Care, [id number]- Manual therapy, [id number]- Gait training, [id number]- Electrical stimulation (manual), [id number] (1-2 muscles), 20561 (3+ muscles)- Dry  Needling, Patient/Family education, Balance training, Stair training, Taping, Joint mobilization, Spinal mobilization, Cryotherapy, and Moist heat  PLAN FOR NEXT SESSION: Manual  TPDN, progress HEP  possible fat pad taping of heel  Graydon Dingwall, PT, ATRIC Certified Exercise Expert for the Aging Adult  09/16/23 4:24 PM Phone: [phone] Fax: [phone]

## 2023-09-16 ENCOUNTER — Other Ambulatory Visit: Payer: Self-pay

## 2023-09-16 ENCOUNTER — Encounter: Payer: Self-pay | Admitting: Physical Therapy

## 2023-09-16 ENCOUNTER — Ambulatory Visit: Attending: Podiatry | Admitting: Physical Therapy

## 2023-09-16 DIAGNOSIS — R262 Difficulty in walking, not elsewhere classified: Secondary | ICD-10-CM | POA: Diagnosis present

## 2023-09-16 DIAGNOSIS — M6281 Muscle weakness (generalized): Secondary | ICD-10-CM | POA: Diagnosis present

## 2023-09-16 DIAGNOSIS — M25571 Pain in right ankle and joints of right foot: Secondary | ICD-10-CM | POA: Insufficient documentation

## 2023-09-16 DIAGNOSIS — M79671 Pain in right foot: Secondary | ICD-10-CM | POA: Insufficient documentation

## 2023-09-16 DIAGNOSIS — M7661 Achilles tendinitis, right leg: Secondary | ICD-10-CM | POA: Insufficient documentation

## 2023-09-16 DIAGNOSIS — M722 Plantar fascial fibromatosis: Secondary | ICD-10-CM | POA: Diagnosis not present

## 2023-09-28 ENCOUNTER — Encounter: Payer: Self-pay | Admitting: Physical Therapy

## 2023-09-28 ENCOUNTER — Ambulatory Visit: Admitting: Physical Therapy

## 2023-09-28 DIAGNOSIS — R262 Difficulty in walking, not elsewhere classified: Secondary | ICD-10-CM

## 2023-09-28 DIAGNOSIS — M6281 Muscle weakness (generalized): Secondary | ICD-10-CM

## 2023-09-28 DIAGNOSIS — M79671 Pain in right foot: Secondary | ICD-10-CM | POA: Diagnosis not present

## 2023-09-28 DIAGNOSIS — M25571 Pain in right ankle and joints of right foot: Secondary | ICD-10-CM

## 2023-09-28 NOTE — Therapy (Signed)
 OUTPATIENT PHYSICAL THERAPY LOWER EXTREMITY TREATMENT   Patient Name: Gail Bass MRN: [id number] DOB:1975-09-22, 48 y.o., female Today's Date: 09/28/2023  END OF SESSION:  PT End of Session - 09/28/23 1438     Visit Number 2    Number of Visits 16    Date for PT Re-Evaluation 11/11/23    Authorization Type [employer] MCD    PT Start Time 1500    PT Stop Time 1545    PT Time Calculation (min) 45 min    Activity Tolerance Patient tolerated treatment well;Patient limited by pain    Behavior During Therapy [facility] for tasks assessed/performed           Past Medical History:  Diagnosis Date   Abscess of buttock 08/13/2019   Asthma    Bacterial vaginosis    Boil 06/14/2019   BV (bacterial vaginosis) 09/13/2008        Hypertension 05/11/2013   Kidney stone    OVARIAN CYST, LEFT 10/29/2008   Qualifier: Diagnosis of  By: Lucille  MD, Taineisha     Pain in the chest 06/06/2015   Painful lumpy right breast 03/21/2013   Recurrent cold sores 12/05/2015   Recurrent sinusitis 06/06/2015   Sinus congestion 04/25/2018   TUBAL PREGNANCY 09/13/2008   Qualifier: History of  By: Lucille  MD, Taineisha     Vaginal discharge 01/10/2014   Vaginal discharge 01/10/2014   Past Surgical History:  Procedure Laterality Date   CYSTECTOMY     CYSTOSCOPY WITH RETROGRADE PYELOGRAM, URETEROSCOPY AND STENT PLACEMENT Left 02/22/2014   Procedure: CYSTOSCOPY WITH RETROGRADE PYELOGRAM, URETEROSCOPY, STONE BASKET EXTRACTION ;  Surgeon: Arlena LILLETTE Gal, MD;  Location: [facility];  Service: Urology;  Laterality: Left;   tailbone cyst     TUBAL LIGATION     Patient Active Problem List   Diagnosis Date Noted   Chronic pelvic pain in female 01/13/2023   Abnormal uterine bleeding (AUB) 12/30/2022   History of ectopic pregnancy 12/30/2022   Screening for cervical cancer 09/24/2022   Screening for colon cancer 09/24/2022   Screening examination for STD (sexually transmitted disease) 09/24/2022   Hot flashes 05/10/2022    Milia of right eyelid 08/27/2021   Viral illness 02/27/2021   Back pain 02/13/2021   Overactive bladder 07/03/2020   Dyspareunia, female 07/03/2020   Mixed stress and urge urinary incontinence 04/11/2020   History of COVID-19 03/13/2020   Seasonal allergies 06/14/2019   PTSD (post-traumatic stress disorder) 12/05/2015   External hemorrhoids 08/07/2015   Vaginal discharge 01/10/2014   Hypertension 05/11/2013   Tobacco use disorder 03/21/2013   Asthma 12/23/2009   Migraine headache 09/13/2008   Depression 06/21/2008    PCP: Janna Ferrier, DO   REFERRING PROVIDER: Silva Juliene SAUNDERS, DPM   REFERRING DIAG:  M72.2 (ICD-10-CM) - Plantar fasciitis  M76.61 (ICD-10-CM) - Achilles tendinitis, right leg  Evaluate and treat for 1-2 sessions / week for 4-6 weeks or at therapist's discretion. Patient has chronic plantar fasciitis, would like to include ROM, strengthening, stability, and manual therapy, dry needling if indicated. Modalities PRN at therapist's discretion.   THERAPY DIAG:  Pain of right heel  Pain in right ankle and joints of right foot  Muscle weakness (generalized)  Difficulty in walking, not elsewhere classified  Rationale for Evaluation and Treatment: Rehabilitation  ONSET DATE: October 2024  SUBJECTIVE:   SUBJECTIVE STATEMENT:  09-28-23  I had a set back when I left here last time on 09-18-23 and I was walking and I sounded  and felt a large snap and pop in my foot almost like a rubber bank popping on my skin. Since then I have been exercising/stretching and I was on vacation and I did not do anything.  I took 800 mg of Advil  twice a day. I did not see the doctor. My pain level is  5-6/10 aching right now.     EVAL-I work in [employer] at [employer].  I started having pain in my foot October 2024. 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 I started walking not normally and using  the outside of my foot. I used to have a high arch and used to wear high heels but I never do now.  I had a cortisone injection 2 weeks ago. After shot I can stand for 3 hours but before I felt pain right when I get up in the morning and I put my feet on the floor. I walk about 6000 steps a day when I go back to work.  Right now I am doing about 3000. I grew up in foster care and I am used to taking care of myself  PERTINENT HISTORY: Asthma, HTN  PAIN:  Are you having pain? Yes: NPRS scale: at rest ache 1/10 and at worst 10/10 Pain location: R heel and  lateral edge of foot over 5th metatarsal and at insertion of Plantar fascia, also over insertional achilles tendon. Pain description: achy, stabbing Aggravating factors: ,standing too long more than 15 minutes.transition from sitting too long,can't exercise, stairs Relieving factors: cortisone shot  PRECAUTIONS:  : Flagyl  [Metronidazole ]  Not Specified: Doxycycline       RED FLAGS: None   WEIGHT BEARING RESTRICTIONS: No  FALLS:  Has patient fallen in last 6 months? No  LIVING ENVIRONMENT: Lives with: lives with their family Lives in: House/apartment Stairs: definite use of hands with stairs  pt tries to avoid takes one step at a time Has following equipment at home: None  OCCUPATION: Engineer, petroleum at [employer]  PLOF: Independent  PATIENT GOALS: improve my pain and ability to stand for my work at [employer] at [employer] MD VISIT: tbd  OBJECTIVE:  Note: Objective measures were completed at Evaluation unless otherwise noted.  DIAGNOSTIC FINDINGS: RADIOLOGY Foot X-ray: No acute fracture or stress fracture. Small plantar calcaneal heel spur. (06/22/2023)  PATIENT SURVEYS:  LEFS  Extreme difficulty/unable (0), Quite a bit of difficulty (1), Moderate difficulty (2), Little difficulty (3), No difficulty (4) Survey date:  09-16-23  Any of your usual work, housework or school activities      3  2. Usual  hobbies, recreational or sporting activities      3  3. Getting into/out of the bath       2  4. Walking between rooms       3  5. Putting on socks/shoes        4  6. Squatting         2  7. Lifting an object, like a bag of groceries from the floor        2  8. Performing light activities around your home          3  9. Performing heavy activities around your home        2  10. Getting into/out of a car  2  11. Walking 2 blocks         3  12. Walking 1 mile         2  13. Going up/down 10 stairs (1 flight)          2  14. Standing for 1 hour         2  15.  sitting for 1 hour        3  16. Running on even ground        0  17. Running on uneven ground         0  18. Making sharp turns while running fast         1  19. Hopping         1  20. Rolling over in bed       4  Score total:  38/80     COGNITION: Overall cognitive status: Within functional limits for tasks assessed     SENSATION: WFL  EDEMA:  Circumferential: R and L both 46 cm   MUSCLE LENGTH: Hamstrings: bil tightness R > L   POSTURE: rounded shoulders, forward head, weight shift left, and Pt with increased pronation of right foot  PALPATION: TTP over heel and insertional achilles and plantar fascia insertion, some tenderness over lateral foot  LOWER EXTREMITY ROM:  Active ROM Right eval Left eval  Hip flexion    Hip extension    Hip abduction    Hip adduction    Hip internal rotation    Hip external rotation    Knee flexion    Knee extension 128 126  Ankle dorsiflexion 8 14  Ankle plantarflexion 39 45  Ankle inversion 34 42  Ankle eversion 15 27   (Blank rows = not tested)  LOWER EXTREMITY MMT:  MMT Right eval Left eval  Hip flexion 4+ 4+  Hip extension 4+ 4+  Hip abduction 4 4  Hip adduction    Hip internal rotation    Hip external rotation    Knee flexion 4+ 4+  Knee extension 4 4+  Ankle dorsiflexion    Ankle plantarflexion 7/25 25/25  Ankle inversion 4- 4+  Ankle eversion      (Blank rows = not tested)  LOWER EXTREMITY SPECIAL TESTS:  NT  FUNCTIONAL TESTS:  5 times sit to stand: 14.59 6 minute walk test: 922 .4 ft SL right 7 sec and Left   30 sec   GAIT: Distance walked: 922.4 ft ( Norm for age  [phone] ft) Assistive device utilized: None Level of assistance: Complete Independence Comments: Pt with decreased stride length and antalgic gait right and collapses medial arch in gait.                                                                                                                                TREATMENT DATE: [facility] Adult PT Treatment:  DATE: 09-28-23 Therapeutic Exercise: Seated Arch Lifts   Seated Plantar Fascia Stretch   Standing heel raise towel under Big toe Seated Figure 4 Ankle Inversion with RTB  SLS  on Left told to do when brushing teeth at home  seated heel raise with weights   Clamshell with Resistance   Alternating Single Leg Bridge  Manual Therapy: Heel pad taping with leuko tape STW over left plantar fascia Neuromuscular re-ed: SLS Tandem stance  EVAL, manual and HEP   Self care for timeline of healing and shoe selection and explanation of plantar fasciitis and achilles tendonitis   PATIENT EDUCATION:  Education details: eval issue HEP, explanation of findings and timeline of healing Person educated: Patient Education method: Explanation, Demonstration, Tactile cues, Verbal cues, and Handouts Education comprehension: verbalized understanding, returned demonstration, verbal cues required, tactile cues required, and needs further education  HOME EXERCISE PROGRAM: Access Code: [email or web address] updated URL: [email or web address] Date: 09/28/2023 Prepared by: Graydon Dingwall  Program Notes Toe Yoga  Exercises - Seated Arch Lifts  - 1 x daily - 7 x weekly - 3 sets - 10 reps - Seated Plantar Fascia Stretch  - 1 x daily - 7 x weekly - 3 sets - 10 reps -  Standing heel raise towel under Big toe  - 1 x daily - 7 x weekly - 3 sets - 10 reps - Seated Figure 4 Ankle Inversion with Resistance  - 1 x daily - 7 x weekly - 3 sets - 10 reps - single limb stance when brushing teeth 60 sec each leg  - 1 x daily - 7 x weekly - 1 sets - 2 min hold - seated heel raise with weights  - 1 x daily - 7 x weekly - 3 sets - 10 reps - Clamshell with Resistance  - 1-15 x daily - 7 x weekly - 2 sets - 10 reps - Alternating Single Leg Bridge  - 1 x daily - 7 x weekly - 3 sets - 10 reps  ASSESSMENT:  CLINICAL IMPRESSION: Gail Bass enters clinic with 5-6/10 pain and explains she was leaving and walking in her crocs when she heard and felt a loud pop/snap in her left foot and showed PT insertion point of plantar fasciitis at left heel.  Pt foot examined and pt able to weight bear without issues so PT continued with updating HEP for increasing strengthening.  Pt doing  seated strengthening and also bil standing heel raises without issue. Single limb on Let with pain 5/10. At end of session pt given fat pad heel taping of Left heel and pain decreased markedly so pt was able to ambulate without antalgic gait.    EVAL- Patient is a 48 y.o. female who was seen today for physical therapy evaluation and treatment for Right plantar fasciitis and achilles tendonitis. 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  Pt has chronic condition and would benefit from skilled PT to improve impairments and access knowledge for pain management and strengthening and improving for more efficient gait.  OBJECTIVE IMPAIRMENTS: decreased activity tolerance, decreased knowledge of condition, decreased mobility, difficulty walking, decreased ROM, decreased strength, increased fascial restrictions, improper body mechanics, and pain.   ACTIVITY LIMITATIONS: bending, standing, squatting, stairs, transfers, locomotion level, and caring for  others  PARTICIPATION LIMITATIONS: meal prep, cleaning, laundry, driving, and occupation  PERSONAL FACTORS: Time since onset of injury/illness/exacerbation are also affecting patient's functional outcome.  REHAB POTENTIAL: Good  CLINICAL DECISION MAKING: Stable/uncomplicated  EVALUATION COMPLEXITY: Low   GOALS: Goals reviewed with patient? Yes  SHORT TERM GOALS: Target date: 10-14-23 Pt will be independent with initial HEP Baseline:no knowledge Goal status: ONGOING  2.  Pt pain will reduce from 10/10 to 6/10 functional activities Baseline: 10/10 with functional activities 09-28-23 5/10 dull ache Goal status:ONGOING  3.  Pt will be able to perform standing SL heel raise on RT 15/25 x Baseline: 7 x on eval with 10/10 pain Goal status: ONGOING  4.  Pt will improve DF to at least 12 degrees to show improvement in flexibilty Baseline: 8 degrees at eval Goal status: ONGOING    LONG TERM GOALS: Target date: 11-11-23  Pt will be independent with advanced HEP  Baseline: no knowledge Goal status: INITIAL  2.  Pt will be able to tolerate 6 min walk test within normal limits (at least 1635 ft to 1980 ft) Baseline: 922.4 ft Goal status: INITIAL  3.  Pt will be able to negotiate steps without exacerbation of pain greater than 3/10  Baseline: Pt avoids steps due to pain Goal status: INITIAL  4.  LEFS score will improve by at least 12 ponts MCID to  50 /80 to show functional improvement Baseline: 38/80 Goal status: INITIAL  5.  Increase R ankle gross strength to >/= 4+/5 with </=3/10 max pain during testing to demo improvement in ankle strength/ stability.  Baseline: See AROM chart Goal status: INITIAL  6.  Pt will be more aware of her gait and bearing weight through her 1st ray/great toe ball of  right foot Baseline: Pt tends to collapse medial arch in gait Goal status: INITIAL  7. Pt will be able to stand for greater than 4 hours in order to return to  work  Baseline:  Pain with 15 minutes of standing  Goal status INITIAL   PLAN:  PT FREQUENCY: 1-2x/week  PT DURATION: 8 weeks  PLANNED INTERVENTIONS: 97164- PT Re-evaluation, 97750- Physical Performance Testing, 97110-Therapeutic exercises, 97530- Therapeutic activity, [id number]- Neuromuscular re-education, 97535- Self Care, [id number]- Manual therapy, [id number]- Gait training, [phone]- Electrical stimulation (manual), [id number] (1-2 muscles), 20561 (3+ muscles)- Dry Needling, Patient/Family education, Balance training, Stair training, Taping, Joint mobilization, Spinal mobilization, Cryotherapy, and Moist heat  PLAN FOR NEXT SESSION: Manual  TPDN, progress HEP  possible fat pad taping of heel  Graydon Dingwall, PT, ATRIC Certified Exercise Expert for the Aging Adult  09/28/23 3:58 PM Phone: [phone] Fax: [phone]

## 2023-10-04 NOTE — Therapy (Incomplete)
 OUTPATIENT PHYSICAL THERAPY LOWER EXTREMITY TREATMENT   Patient Name: Gail Bass MRN: [id number] DOB:11-18-1975, 48 y.o., female Today's Date: 10/04/2023  END OF SESSION:     Past Medical History:  Diagnosis Date   Abscess of buttock 08/13/2019   Asthma    Bacterial vaginosis    Boil 06/14/2019   BV (bacterial vaginosis) 09/13/2008        Hypertension 05/11/2013   Kidney stone    OVARIAN CYST, LEFT 10/29/2008   Qualifier: Diagnosis of  By: Lucille  MD, Taineisha     Pain in the chest 06/06/2015   Painful lumpy right breast 03/21/2013   Recurrent cold sores 12/05/2015   Recurrent sinusitis 06/06/2015   Sinus congestion 04/25/2018   TUBAL PREGNANCY 09/13/2008   Qualifier: History of  By: Lucille  MD, Taineisha     Vaginal discharge 01/10/2014   Vaginal discharge 01/10/2014   Past Surgical History:  Procedure Laterality Date   CYSTECTOMY     CYSTOSCOPY WITH RETROGRADE PYELOGRAM, URETEROSCOPY AND STENT PLACEMENT Left 02/22/2014   Procedure: CYSTOSCOPY WITH RETROGRADE PYELOGRAM, URETEROSCOPY, STONE BASKET EXTRACTION ;  Surgeon: Arlena LILLETTE Gal, MD;  Location: [facility];  Service: Urology;  Laterality: Left;   tailbone cyst     TUBAL LIGATION     Patient Active Problem List   Diagnosis Date Noted   Chronic pelvic pain in female 01/13/2023   Abnormal uterine bleeding (AUB) 12/30/2022   History of ectopic pregnancy 12/30/2022   Screening for cervical cancer 09/24/2022   Screening for colon cancer 09/24/2022   Screening examination for STD (sexually transmitted disease) 09/24/2022   Hot flashes 05/10/2022   Milia of right eyelid 08/27/2021   Viral illness 02/27/2021   Back pain 02/13/2021   Overactive bladder 07/03/2020   Dyspareunia, female 07/03/2020   Mixed stress and urge urinary incontinence 04/11/2020   History of COVID-19 03/13/2020   Seasonal allergies 06/14/2019   PTSD (post-traumatic stress disorder) 12/05/2015   External hemorrhoids 08/07/2015   Vaginal  discharge 01/10/2014   Hypertension 05/11/2013   Tobacco use disorder 03/21/2013   Asthma 12/23/2009   Migraine headache 09/13/2008   Depression 06/21/2008    PCP: Janna Ferrier, DO   REFERRING PROVIDER: Silva Juliene SAUNDERS, DPM   REFERRING DIAG:  M72.2 (ICD-10-CM) - Plantar fasciitis  M76.61 (ICD-10-CM) - Achilles tendinitis, right leg  Evaluate and treat for 1-2 sessions / week for 4-6 weeks or at therapist's discretion. Patient has chronic plantar fasciitis, would like to include ROM, strengthening, stability, and manual therapy, dry needling if indicated. Modalities PRN at therapist's discretion.   THERAPY DIAG:  No diagnosis found.  Rationale for Evaluation and Treatment: Rehabilitation  ONSET DATE: October 2024  SUBJECTIVE:   SUBJECTIVE STATEMENT:  09-28-23  I had a set back when I left here last time on 09-18-23 and I was walking and I sounded and felt a large snap and pop in my foot almost like a rubber bank popping on my skin. Since then I have been exercising/stretching and I was on vacation and I did not do anything.  I took 800 mg of Advil  twice a day. I did not see the doctor. My pain level is  5-6/10 aching right now.     EVAL-I work in [employer] at [employer].  I started having pain in my foot October 2024. 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 I started walking not normally and using the outside of my foot. I used to have a high arch and used to wear high heels but I never do now.  I had a cortisone injection 2 weeks ago. After shot I can stand for 3 hours but before I felt pain right when I get up in the morning and I put my feet on the floor. I walk about 6000 steps a day when I go back to work.  Right now I am doing about 3000. I grew up in foster care and I am used to taking care of myself  PERTINENT HISTORY: Asthma, HTN  PAIN:  Are you having pain? Yes: NPRS scale: at  rest ache 1/10 and at worst 10/10 Pain location: R heel and  lateral edge of foot over 5th metatarsal and at insertion of Plantar fascia, also over insertional achilles tendon. Pain description: achy, stabbing Aggravating factors: ,standing too long more than 15 minutes.transition from sitting too long,can't exercise, stairs Relieving factors: cortisone shot  PRECAUTIONS:  : Flagyl  [Metronidazole ]  Not Specified: Doxycycline       RED FLAGS: None   WEIGHT BEARING RESTRICTIONS: No  FALLS:  Has patient fallen in last 6 months? No  LIVING ENVIRONMENT: Lives with: lives with their family Lives in: House/apartment Stairs: definite use of hands with stairs  pt tries to avoid takes one step at a time Has following equipment at home: None  OCCUPATION: Engineer, petroleum at [employer]  PLOF: Independent  PATIENT GOALS: improve my pain and ability to stand for my work at [employer] at [employer] MD VISIT: tbd  OBJECTIVE:  Note: Objective measures were completed at Evaluation unless otherwise noted.  DIAGNOSTIC FINDINGS: RADIOLOGY Foot X-ray: No acute fracture or stress fracture. Small plantar calcaneal heel spur. (06/22/2023)  PATIENT SURVEYS:  LEFS  Extreme difficulty/unable (0), Quite a bit of difficulty (1), Moderate difficulty (2), Little difficulty (3), No difficulty (4) Survey date:  09-16-23  Any of your usual work, housework or school activities      3  2. Usual hobbies, recreational or sporting activities      3  3. Getting into/out of the bath       2  4. Walking between rooms       3  5. Putting on socks/shoes        4  6. Squatting         2  7. Lifting an object, like a bag of groceries from the floor        2  8. Performing light activities around your home          3  9. Performing heavy activities around your home        2  10. Getting into/out of a car         2  11. Walking 2 blocks         3  12. Walking 1 mile         2  13. Going  up/down 10 stairs (1 flight)          2  14. Standing for 1 hour         2  15.  sitting for 1 hour        3  16. Running on even ground        0  17. Running on uneven ground         0  18.  Making sharp turns while running fast         1  19. Hopping         1  20. Rolling over in bed       4  Score total:  38/80     COGNITION: Overall cognitive status: Within functional limits for tasks assessed     SENSATION: WFL  EDEMA:  Circumferential: R and L both 46 cm   MUSCLE LENGTH: Hamstrings: bil tightness R > L   POSTURE: rounded shoulders, forward head, weight shift left, and Pt with increased pronation of right foot  PALPATION: TTP over heel and insertional achilles and plantar fascia insertion, some tenderness over lateral foot  LOWER EXTREMITY ROM:  Active ROM Right eval Left eval  Hip flexion    Hip extension    Hip abduction    Hip adduction    Hip internal rotation    Hip external rotation    Knee flexion    Knee extension 128 126  Ankle dorsiflexion 8 14  Ankle plantarflexion 39 45  Ankle inversion 34 42  Ankle eversion 15 27   (Blank rows = not tested)  LOWER EXTREMITY MMT:  MMT Right eval Left eval  Hip flexion 4+ 4+  Hip extension 4+ 4+  Hip abduction 4 4  Hip adduction    Hip internal rotation    Hip external rotation    Knee flexion 4+ 4+  Knee extension 4 4+  Ankle dorsiflexion    Ankle plantarflexion 7/25 25/25  Ankle inversion 4- 4+  Ankle eversion     (Blank rows = not tested)  LOWER EXTREMITY SPECIAL TESTS:  NT  FUNCTIONAL TESTS:  5 times sit to stand: 14.59 6 minute walk test: 922 .4 ft SL right 7 sec and Left   30 sec   GAIT: Distance walked: 922.4 ft ( Norm for age  [phone] ft) Assistive device utilized: None Level of assistance: Complete Independence Comments: Pt with decreased stride length and antalgic gait right and collapses medial arch in gait.                                                                                                                                 TREATMENT DATE: [facility] Adult PT Treatment:                                                DATE: *** Therapeutic Exercise: *** Manual Therapy: *** Neuromuscular re-ed: *** Therapeutic Activity: *** Modalities: *** Self Care: ***   Gail Bass Adult PT Treatment:  DATE: 09-28-23 Therapeutic Exercise: Seated Arch Lifts   Seated Plantar Fascia Stretch   Standing heel raise towel under Big toe Seated Figure 4 Ankle Inversion with RTB  SLS  on Left told to do when brushing teeth at home  seated heel raise with weights   Clamshell with Resistance   Alternating Single Leg Bridge  Manual Therapy: Heel pad taping with leuko tape STW over left plantar fascia Neuromuscular re-ed: SLS Tandem stance  EVAL, manual and HEP   Self care for timeline of healing and shoe selection and explanation of plantar fasciitis and achilles tendonitis   PATIENT EDUCATION:  Education details: eval issue HEP, explanation of findings and timeline of healing Person educated: Patient Education method: Explanation, Demonstration, Tactile cues, Verbal cues, and Handouts Education comprehension: verbalized understanding, returned demonstration, verbal cues required, tactile cues required, and needs further education  HOME EXERCISE PROGRAM: Access Code: [email or web address] updated URL: [email or web address] Date: 09/28/2023 Prepared by: Graydon Dingwall  Program Notes Toe Yoga  Exercises - Seated Arch Lifts  - 1 x daily - 7 x weekly - 3 sets - 10 reps - Seated Plantar Fascia Stretch  - 1 x daily - 7 x weekly - 3 sets - 10 reps - Standing heel raise towel under Big toe  - 1 x daily - 7 x weekly - 3 sets - 10 reps - Seated Figure 4 Ankle Inversion with Resistance  - 1 x daily - 7 x weekly - 3 sets - 10 reps - single limb stance when brushing teeth 60 sec each leg  - 1 x daily - 7 x weekly - 1 sets -  2 min hold - seated heel raise with weights  - 1 x daily - 7 x weekly - 3 sets - 10 reps - Clamshell with Resistance  - 1-15 x daily - 7 x weekly - 2 sets - 10 reps - Alternating Single Leg Bridge  - 1 x daily - 7 x weekly - 3 sets - 10 reps  ASSESSMENT:  CLINICAL IMPRESSION: Gail Bass enters clinic with 5-6/10 pain and explains she was leaving and walking in her crocs when she heard and felt a loud pop/snap in her left foot and showed PT insertion point of plantar fasciitis at left heel.  Pt foot examined and pt able to weight bear without issues so PT continued with updating HEP for increasing strengthening.  Pt doing  seated strengthening and also bil standing heel raises without issue. Single limb on Let with pain 5/10. At end of session pt given fat pad heel taping of Left heel and pain decreased markedly so pt was able to ambulate without antalgic gait.    EVAL- Patient is a 48 y.o. female who was seen today for physical therapy evaluation and treatment for Right plantar fasciitis and achilles tendonitis. 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  Pt has chronic condition and would benefit from skilled PT to improve impairments and access knowledge for pain management and strengthening and improving for more efficient gait.  OBJECTIVE IMPAIRMENTS: decreased activity tolerance, decreased knowledge of condition, decreased mobility, difficulty walking, decreased ROM, decreased strength, increased fascial restrictions, improper body mechanics, and pain.   ACTIVITY LIMITATIONS: bending, standing, squatting, stairs, transfers, locomotion level, and caring for others  PARTICIPATION LIMITATIONS: meal prep, cleaning, laundry, driving, and occupation  PERSONAL FACTORS: Time since onset of injury/illness/exacerbation are also affecting patient's functional outcome.   REHAB  POTENTIAL: Good  CLINICAL DECISION MAKING:  Stable/uncomplicated  EVALUATION COMPLEXITY: Low   GOALS: Goals reviewed with patient? Yes  SHORT TERM GOALS: Target date: 10-14-23 Pt will be independent with initial HEP Baseline:no knowledge Goal status: ONGOING  2.  Pt pain will reduce from 10/10 to 6/10 functional activities Baseline: 10/10 with functional activities 09-28-23 5/10 dull ache Goal status:ONGOING  3.  Pt will be able to perform standing SL heel raise on RT 15/25 x Baseline: 7 x on eval with 10/10 pain Goal status: ONGOING  4.  Pt will improve DF to at least 12 degrees to show improvement in flexibilty Baseline: 8 degrees at eval Goal status: ONGOING    LONG TERM GOALS: Target date: 11-11-23  Pt will be independent with advanced HEP  Baseline: no knowledge Goal status: INITIAL  2.  Pt will be able to tolerate 6 min walk test within normal limits (at least 1635 ft to 1980 ft) Baseline: 922.4 ft Goal status: INITIAL  3.  Pt will be able to negotiate steps without exacerbation of pain greater than 3/10  Baseline: Pt avoids steps due to pain Goal status: INITIAL  4.  LEFS score will improve by at least 12 ponts MCID to  50 /80 to show functional improvement Baseline: 38/80 Goal status: INITIAL  5.  Increase R ankle gross strength to >/= 4+/5 with </=3/10 max pain during testing to demo improvement in ankle strength/ stability.  Baseline: See AROM chart Goal status: INITIAL  6.  Pt will be more aware of her gait and bearing weight through her 1st ray/great toe ball of  right foot Baseline: Pt tends to collapse medial arch in gait Goal status: INITIAL  7. Pt will be able to stand for greater than 4 hours in order to return to work  Baseline:  Pain with 15 minutes of standing  Goal status INITIAL   PLAN:  PT FREQUENCY: 1-2x/week  PT DURATION: 8 weeks  PLANNED INTERVENTIONS: 97164- PT Re-evaluation, 97750- Physical Performance Testing, 97110-Therapeutic exercises, 97530- Therapeutic  activity, [id number]- Neuromuscular re-education, 97535- Self Care, [id number]- Manual therapy, [id number]- Gait training, [phone]- Electrical stimulation (manual), [phone] (1-2 muscles), [id number] (3+ muscles)- Dry Needling, Patient/Family education, Balance training, Stair training, Taping, Joint mobilization, Spinal mobilization, Cryotherapy, and Moist heat  PLAN FOR NEXT SESSION: Manual  TPDN, progress HEP  possible fat pad taping of heel  ***

## 2023-10-05 ENCOUNTER — Ambulatory Visit: Admitting: Physical Therapy

## 2023-10-05 ENCOUNTER — Encounter: Payer: Self-pay | Admitting: Physical Therapy

## 2023-10-11 ENCOUNTER — Ambulatory Visit: Attending: Podiatry | Admitting: Physical Therapy

## 2023-10-11 ENCOUNTER — Encounter: Payer: Self-pay | Admitting: Physical Therapy

## 2023-10-11 DIAGNOSIS — M79671 Pain in right foot: Secondary | ICD-10-CM | POA: Diagnosis present

## 2023-10-11 DIAGNOSIS — M25571 Pain in right ankle and joints of right foot: Secondary | ICD-10-CM | POA: Insufficient documentation

## 2023-10-11 DIAGNOSIS — M6281 Muscle weakness (generalized): Secondary | ICD-10-CM | POA: Diagnosis present

## 2023-10-11 NOTE — Therapy (Signed)
 OUTPATIENT PHYSICAL THERAPY LOWER EXTREMITY TREATMENT   Patient Name: Gail Bass MRN: [id number] DOB:[birth year], 48 y.o., female Today's Date: 10/11/2023  END OF SESSION:  PT End of Session - 10/11/23 1102     Visit Number 3    Number of Visits 16    Date for PT Re-Evaluation 11/11/23    Authorization Type [employer] MCD    PT Start Time 1101    PT Stop Time 1140    PT Time Calculation (min) 39 min    Activity Tolerance Patient tolerated treatment well;Patient limited by pain    Behavior During Therapy [facility] for tasks assessed/performed           Past Medical History:  Diagnosis Date   Abscess of buttock 08/13/2019   Asthma    Bacterial vaginosis    Boil 06/14/2019   BV (bacterial vaginosis) 09/13/2008        Hypertension 05/11/2013   Kidney stone    OVARIAN CYST, LEFT 10/29/2008   Qualifier: Diagnosis of  By: Lucille  MD, Taineisha     Pain in the chest 06/06/2015   Painful lumpy right breast 03/21/2013   Recurrent cold sores 12/05/2015   Recurrent sinusitis 06/06/2015   Sinus congestion 04/25/2018   TUBAL PREGNANCY 09/13/2008   Qualifier: History of  By: Lucille  MD, Taineisha     Vaginal discharge 01/10/2014   Vaginal discharge 01/10/2014   Past Surgical History:  Procedure Laterality Date   CYSTECTOMY     CYSTOSCOPY WITH RETROGRADE PYELOGRAM, URETEROSCOPY AND STENT PLACEMENT Left 02/22/2014   Procedure: CYSTOSCOPY WITH RETROGRADE PYELOGRAM, URETEROSCOPY, STONE BASKET EXTRACTION ;  Surgeon: Arlena LILLETTE Gal, MD;  Location: [facility];  Service: Urology;  Laterality: Left;   tailbone cyst     TUBAL LIGATION     Patient Active Problem List   Diagnosis Date Noted   Chronic pelvic pain in female 01/13/2023   Abnormal uterine bleeding (AUB) 12/30/2022   History of ectopic pregnancy 12/30/2022   Screening for cervical cancer 09/24/2022   Screening for colon cancer 09/24/2022   Screening examination for STD (sexually transmitted disease) 09/24/2022   Hot flashes 05/10/2022    Milia of right eyelid 08/27/2021   Viral illness 02/27/2021   Back pain 02/13/2021   Overactive bladder 07/03/2020   Dyspareunia, female 07/03/2020   Mixed stress and urge urinary incontinence 04/11/2020   History of COVID-19 03/13/2020   Seasonal allergies 06/14/2019   PTSD (post-traumatic stress disorder) 12/05/2015   External hemorrhoids 08/07/2015   Vaginal discharge 01/10/2014   Hypertension 05/11/2013   Tobacco use disorder 03/21/2013   Asthma 12/23/2009   Migraine headache 09/13/2008   Depression 06/21/2008    PCP: Janna Ferrier, DO   REFERRING PROVIDER: Silva Juliene SAUNDERS, DPM   REFERRING DIAG:  M72.2 (ICD-10-CM) - Plantar fasciitis  M76.61 (ICD-10-CM) - Achilles tendinitis, right leg  Evaluate and treat for 1-2 sessions / week for 4-6 weeks or at therapist's discretion. Patient has chronic plantar fasciitis, would like to include ROM, strengthening, stability, and manual therapy, dry needling if indicated. Modalities PRN at therapist's discretion.   THERAPY DIAG:  Pain of right heel  Pain in right ankle and joints of right foot  Muscle weakness (generalized)  Rationale for Evaluation and Treatment: Rehabilitation  ONSET DATE: October 2024  SUBJECTIVE:   SUBJECTIVE STATEMENT:  10/11/2023:  Pt reports continued pain in the R heel.  Her medial arch pain is improving     EVAL-I work in [employer] at [employer].  I started having pain in my foot October 2024. 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 I started walking not normally and using the outside of my foot. I used to have a high arch and used to wear high heels but I never do now.  I had a cortisone injection 2 weeks ago. After shot I can stand for 3 hours but before I felt pain right when I get up in the morning and I put my feet on the floor. I walk about 6000 steps a day when I go back to work.  Right now I am doing  about 3000. I grew up in foster care and I am used to taking care of myself  PERTINENT HISTORY: Asthma, HTN  PAIN:  Are you having pain? Yes: NPRS scale: at rest ache 1/10 and at worst 10/10 Pain location: R heel and  lateral edge of foot over 5th metatarsal and at insertion of Plantar fascia, also over insertional achilles tendon. Pain description: achy, stabbing Aggravating factors: ,standing too long more than 15 minutes.transition from sitting too long,can't exercise, stairs Relieving factors: cortisone shot  PRECAUTIONS:  : Flagyl  [Metronidazole ]  Not Specified: Doxycycline       RED FLAGS: None   WEIGHT BEARING RESTRICTIONS: No  FALLS:  Has patient fallen in last 6 months? No  LIVING ENVIRONMENT: Lives with: lives with their family Lives in: House/apartment Stairs: definite use of hands with stairs  pt tries to avoid takes one step at a time Has following equipment at home: None  OCCUPATION: Engineer, petroleum at [employer]  PLOF: Independent  PATIENT GOALS: improve my pain and ability to stand for my work at [employer] at [employer] MD VISIT: tbd  OBJECTIVE:  Note: Objective measures were completed at Evaluation unless otherwise noted.  DIAGNOSTIC FINDINGS: RADIOLOGY Foot X-ray: No acute fracture or stress fracture. Small plantar calcaneal heel spur. (06/22/2023)  PATIENT SURVEYS:  LEFS  Extreme difficulty/unable (0), Quite a bit of difficulty (1), Moderate difficulty (2), Little difficulty (3), No difficulty (4) Survey date:  09-16-23  Any of your usual work, housework or school activities      3  2. Usual hobbies, recreational or sporting activities      3  3. Getting into/out of the bath       2  4. Walking between rooms       3  5. Putting on socks/shoes        4  6. Squatting         2  7. Lifting an object, like a bag of groceries from the floor        2  8. Performing light activities around your home          3  9. Performing heavy  activities around your home        2  10. Getting into/out of a car         2  11. Walking 2 blocks         3  12. Walking 1 mile         2  13. Going up/down 10 stairs (1 flight)          2  14. Standing for 1 hour         2  15.  sitting for 1 hour  3  16. Running on even ground        0  17. Running on uneven ground         0  18. Making sharp turns while running fast         1  19. Hopping         1  20. Rolling over in bed       4  Score total:  38/80     COGNITION: Overall cognitive status: Within functional limits for tasks assessed     SENSATION: WFL  EDEMA:  Circumferential: R and L both 46 cm   MUSCLE LENGTH: Hamstrings: bil tightness R > L   POSTURE: rounded shoulders, forward head, weight shift left, and Pt with increased pronation of right foot  PALPATION: TTP over heel and insertional achilles and plantar fascia insertion, some tenderness over lateral foot  LOWER EXTREMITY ROM:  Active ROM Right eval Left eval  Hip flexion    Hip extension    Hip abduction    Hip adduction    Hip internal rotation    Hip external rotation    Knee flexion    Knee extension 128 126  Ankle dorsiflexion 8 14  Ankle plantarflexion 39 45  Ankle inversion 34 42  Ankle eversion 15 27   (Blank rows = not tested)  LOWER EXTREMITY MMT:  MMT Right eval Left eval  Hip flexion 4+ 4+  Hip extension 4+ 4+  Hip abduction 4 4  Hip adduction    Hip internal rotation    Hip external rotation    Knee flexion 4+ 4+  Knee extension 4 4+  Ankle dorsiflexion    Ankle plantarflexion 7/25 25/25  Ankle inversion 4- 4+  Ankle eversion     (Blank rows = not tested)  LOWER EXTREMITY SPECIAL TESTS:  NT  FUNCTIONAL TESTS:  5 times sit to stand: 14.59 6 minute walk test: 922 .4 ft SL right 7 sec and Left   30 sec   GAIT: Distance walked: 922.4 ft ( Norm for age  [phone] ft) Assistive device utilized: None Level of assistance: Complete Independence Comments:  Pt with decreased stride length and antalgic gait right and collapses medial arch in gait.                                                                                                                                TREATMENT DATE: [facility] Adult PT Treatment:                                                DATE: 10/11/2023 Therapeutic Exercise: Standing Arch Lifts   Towel scrunch with 1# weight Seated Plantar Fascia Stretch   SL heel raise on leg press - 40# - 3x10 Leg press SL -  60# Manual Therapy: Heel pad taping with leuko tape    PATIENT EDUCATION:  Education details: eval issue HEP, explanation of findings and timeline of healing Person educated: Patient Education method: Explanation, Demonstration, Tactile cues, Verbal cues, and Handouts Education comprehension: verbalized understanding, returned demonstration, verbal cues required, tactile cues required, and needs further education  HOME EXERCISE PROGRAM: Access Code: [email or web address] updated URL: [email or web address] Date: 09/28/2023 Prepared by: Graydon Dingwall  Program Notes Toe Yoga  Exercises - Seated Arch Lifts  - 1 x daily - 7 x weekly - 3 sets - 10 reps - Seated Plantar Fascia Stretch  - 1 x daily - 7 x weekly - 3 sets - 10 reps - Standing heel raise towel under Big toe  - 1 x daily - 7 x weekly - 3 sets - 10 reps - Seated Figure 4 Ankle Inversion with Resistance  - 1 x daily - 7 x weekly - 3 sets - 10 reps - single limb stance when brushing teeth 60 sec each leg  - 1 x daily - 7 x weekly - 1 sets - 2 min hold - seated heel raise with weights  - 1 x daily - 7 x weekly - 3 sets - 10 reps - Clamshell with Resistance  - 1-15 x daily - 7 x weekly - 2 sets - 10 reps - Alternating Single Leg Bridge  - 1 x daily - 7 x weekly - 3 sets - 10 reps  ASSESSMENT:  CLINICAL IMPRESSION: 10/11/2023:  Pt tolerated session well.  Medial arch pain is significantly improved but pt continues to endorse heel pain,  particularly with direct pressure.  Taped again today with pt reporting reduction in pain with this.  Introduced progressive loading on leg press with fatigue noted but no increase in heel pain.  Pt will follow up with MD tomorrow.    EVAL- Patient is a 48 y.o. female who was seen today for physical therapy evaluation and treatment for Right plantar fasciitis and achilles tendonitis. 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  Pt has chronic condition and would benefit from skilled PT to improve impairments and access knowledge for pain management and strengthening and improving for more efficient gait.  OBJECTIVE IMPAIRMENTS: decreased activity tolerance, decreased knowledge of condition, decreased mobility, difficulty walking, decreased ROM, decreased strength, increased fascial restrictions, improper body mechanics, and pain.   ACTIVITY LIMITATIONS: bending, standing, squatting, stairs, transfers, locomotion level, and caring for others  PARTICIPATION LIMITATIONS: meal prep, cleaning, laundry, driving, and occupation  PERSONAL FACTORS: Time since onset of injury/illness/exacerbation are also affecting patient's functional outcome.   REHAB POTENTIAL: Good  CLINICAL DECISION MAKING: Stable/uncomplicated  EVALUATION COMPLEXITY: Low   GOALS: Goals reviewed with patient? Yes  SHORT TERM GOALS: Target date: 10-14-23 Pt will be independent with initial HEP Baseline:no knowledge Goal status: ONGOING  2.  Pt pain will reduce from 10/10 to 6/10 functional activities Baseline: 10/10 with functional activities 09-28-23 5/10 dull ache Goal status:ONGOING  3.  Pt will be able to perform standing SL heel raise on RT 15/25 x Baseline: 7 x on eval with 10/10 pain Goal status: ONGOING  4.  Pt will improve DF to at least 12 degrees to show improvement in flexibilty Baseline: 8 degrees at eval Goal status: ONGOING    LONG TERM GOALS:  Target date: 11-11-23  Pt will be independent with advanced HEP  Baseline: no knowledge Goal status:  INITIAL  2.  Pt will be able to tolerate 6 min walk test within normal limits (at least 1635 ft to 1980 ft) Baseline: 922.4 ft Goal status: INITIAL  3.  Pt will be able to negotiate steps without exacerbation of pain greater than 3/10  Baseline: Pt avoids steps due to pain Goal status: INITIAL  4.  LEFS score will improve by at least 12 ponts MCID to  50 /80 to show functional improvement Baseline: 38/80 Goal status: INITIAL  5.  Increase R ankle gross strength to >/= 4+/5 with </=3/10 max pain during testing to demo improvement in ankle strength/ stability.  Baseline: See AROM chart Goal status: INITIAL  6.  Pt will be more aware of her gait and bearing weight through her 1st ray/great toe ball of  right foot Baseline: Pt tends to collapse medial arch in gait Goal status: INITIAL  7. Pt will be able to stand for greater than 4 hours in order to return to work  Baseline:  Pain with 15 minutes of standing  Goal status INITIAL   PLAN:  PT FREQUENCY: 1-2x/week  PT DURATION: 8 weeks  PLANNED INTERVENTIONS: 97164- PT Re-evaluation, 97750- Physical Performance Testing, 97110-Therapeutic exercises, 97530- Therapeutic activity, [id number]- Neuromuscular re-education, 97535- Self Care, [id number]- Manual therapy, [id number]- Gait training, [phone]- Electrical stimulation (manual), [phone] (1-2 muscles), 20561 (3+ muscles)- Dry Needling, Patient/Family education, Balance training, Stair training, Taping, Joint mobilization, Spinal mobilization, Cryotherapy, and Moist heat  PLAN FOR NEXT SESSION: Manual  TPDN, progress HEP  possible fat pad taping of heel  Helene BRAVO Lolamae Voisin PT 10/11/23 11:44 AM Phone: [phone] Fax: [phone]

## 2023-10-12 ENCOUNTER — Encounter: Payer: Self-pay | Admitting: Podiatry

## 2023-10-12 ENCOUNTER — Telehealth: Payer: Self-pay | Admitting: Podiatry

## 2023-10-12 ENCOUNTER — Other Ambulatory Visit: Payer: Self-pay | Admitting: Podiatry

## 2023-10-12 ENCOUNTER — Ambulatory Visit (INDEPENDENT_AMBULATORY_CARE_PROVIDER_SITE_OTHER): Admitting: Podiatry

## 2023-10-12 VITALS — Ht 63.0 in | Wt 163.0 lb

## 2023-10-12 DIAGNOSIS — M629 Disorder of muscle, unspecified: Secondary | ICD-10-CM | POA: Diagnosis not present

## 2023-10-12 MED ORDER — FAMOTIDINE 40 MG PO TABS: 40.0000 mg | ORAL_TABLET | Freq: Every day | ORAL | 0 refills | Status: AC

## 2023-10-12 NOTE — Progress Notes (Signed)
  Subjective:  Patient ID: Gail Bass, female    DOB: 09/04/1975,  MRN: [id number]  Chief Complaint  Patient presents with   Plantar Fasciitis    RM 4 Return in about 6 weeks (around 10/12/2023) for recheck plantar fasciitis. Patient states physical therapy has not provided any pain relief.    Discussed the use of AI scribe software for clinical note transcription with the patient, who gave verbal consent to proceed.  History of Present Illness No better since last visit.  After the last visit on June 18 she noticed a pop while she was at [employer].  Had some significant swelling and pain.  Has continued physical therapy and has been doing taping for this which has helped some.      Objective:    Physical Exam VASCULAR: DP and PT pulse palpable. Foot is warm and well-perfused. Capillary fill time is brisk. DERMATOLOGIC: Normal skin turgor, texture, and temperature. No open lesions, rashes, or ulcerations. NEUROLOGIC: Normal sensation to light touch and pressure. No paresthesias. ORTHOPEDIC: Sharp pain on palpation of plantar medial band of plantar fascia   No images are attached to the encounter.    Results Procedure: Corticosteroid injection Description: The right heel was prepped with alcohol and ethyl chloride spray was used as a topical anesthetic. The insertion of the medial band of the plantar fascia was injected with 20 mg of Kenalog , 4 mg of dexamethasone , and 5 mg of Marcaine. The patient tolerated the procedure well. Informed Consent: Consent and a discussion of the risks, benefits, and alternatives were provided.  RADIOLOGY Foot X-ray: No acute fracture or stress fracture. Small plantar calcaneal heel spur. (06/22/2023)     Assessment:   1. Nontraumatic tear of plantar fascia      Plan:  Patient was evaluated and treated and all questions answered.  Assessment and Plan Assessment & Plan Plantar fasciitis, right foot There is concern for rupture.  She  may benefit from immobilization and I dispensed a cam walker boot to utilize this today.  She may not be able to work with this she will notify me in regards to this.  She also needs a note to be able to use the elevator at work and this will be provided as well.  If she is able to work with the CAM boot with the toe guard that I will provide documentation for this accommodation as well if she is able to proceed with work with this.  If she is not able to proceed without accommodation then she will utilize the boot is much as possible outside of work.  I recommend an MRI to evaluate for the possibility of tear before any further treatment or injection.  We discussed the possibility this may require surgery at some point if not improving.  MRI referral placed and I will see her back in a few weeks to reevaluate.      Return in about 3 weeks (around 11/02/2023) for recheck plantar fasciitis, after MRI to review.

## 2023-10-12 NOTE — Telephone Encounter (Signed)
 Patient came in to request a refill of the medication of Famotidine ? She says that their is a medication that she takes that raises her Blood Pressure.

## 2023-10-12 NOTE — Patient Instructions (Signed)

## 2023-10-24 ENCOUNTER — Ambulatory Visit
Admission: RE | Admit: 2023-10-24 | Discharge: 2023-10-24 | Disposition: A | Discharge status: Home / Self Care | Source: Ambulatory Visit | Attending: Podiatry | Admitting: Podiatry

## 2023-10-24 DIAGNOSIS — M76821 Posterior tibial tendinitis, right leg: Secondary | ICD-10-CM | POA: Diagnosis not present

## 2023-10-24 DIAGNOSIS — M629 Disorder of muscle, unspecified: Secondary | ICD-10-CM

## 2023-10-26 ENCOUNTER — Encounter: Payer: Self-pay | Admitting: Physical Therapy

## 2023-10-26 ENCOUNTER — Ambulatory Visit: Admitting: Physical Therapy

## 2023-10-26 DIAGNOSIS — M79671 Pain in right foot: Secondary | ICD-10-CM | POA: Diagnosis not present

## 2023-10-26 DIAGNOSIS — M6281 Muscle weakness (generalized): Secondary | ICD-10-CM

## 2023-10-26 DIAGNOSIS — M25571 Pain in right ankle and joints of right foot: Secondary | ICD-10-CM

## 2023-10-26 NOTE — Therapy (Signed)
 OUTPATIENT PHYSICAL THERAPY LOWER EXTREMITY TREATMENT   Patient Name: Gail Bass MRN: [id number] DOB:[birth year], 48 y.o., female Today's Date: 10/26/2023  END OF SESSION:  PT End of Session - 10/26/23 1500     Visit Number 4    Number of Visits 16    Date for PT Re-Evaluation 11/11/23    Authorization Type [employer] MCD    PT Start Time 1500    PT Stop Time 1541    PT Time Calculation (min) 41 min    Activity Tolerance Patient tolerated treatment well;Patient limited by pain    Behavior During Therapy [facility] for tasks assessed/performed           Past Medical History:  Diagnosis Date   Abscess of buttock 08/13/2019   Asthma    Bacterial vaginosis    Boil 06/14/2019   BV (bacterial vaginosis) 09/13/2008        Hypertension 05/11/2013   Kidney stone    OVARIAN CYST, LEFT 10/29/2008   Qualifier: Diagnosis of  By: Lucille  MD, Taineisha     Pain in the chest 06/06/2015   Painful lumpy right breast 03/21/2013   Recurrent cold sores 12/05/2015   Recurrent sinusitis 06/06/2015   Sinus congestion 04/25/2018   TUBAL PREGNANCY 09/13/2008   Qualifier: History of  By: Lucille  MD, Taineisha     Vaginal discharge 01/10/2014   Vaginal discharge 01/10/2014   Past Surgical History:  Procedure Laterality Date   CYSTECTOMY     CYSTOSCOPY WITH RETROGRADE PYELOGRAM, URETEROSCOPY AND STENT PLACEMENT Left 02/22/2014   Procedure: CYSTOSCOPY WITH RETROGRADE PYELOGRAM, URETEROSCOPY, STONE BASKET EXTRACTION ;  Surgeon: Arlena LILLETTE Gal, MD;  Location: [facility];  Service: Urology;  Laterality: Left;   tailbone cyst     TUBAL LIGATION     Patient Active Problem List   Diagnosis Date Noted   Chronic pelvic pain in female 01/13/2023   Abnormal uterine bleeding (AUB) 12/30/2022   History of ectopic pregnancy 12/30/2022   Screening for cervical cancer 09/24/2022   Screening for colon cancer 09/24/2022   Screening examination for STD (sexually transmitted disease) 09/24/2022   Hot flashes 05/10/2022    Milia of right eyelid 08/27/2021   Viral illness 02/27/2021   Back pain 02/13/2021   Overactive bladder 07/03/2020   Dyspareunia, female 07/03/2020   Mixed stress and urge urinary incontinence 04/11/2020   History of COVID-19 03/13/2020   Seasonal allergies 06/14/2019   PTSD (post-traumatic stress disorder) 12/05/2015   External hemorrhoids 08/07/2015   Vaginal discharge 01/10/2014   Hypertension 05/11/2013   Tobacco use disorder 03/21/2013   Asthma 12/23/2009   Migraine headache 09/13/2008   Depression 06/21/2008    PCP: Janna Ferrier, DO   REFERRING PROVIDER: Silva Juliene SAUNDERS, DPM   REFERRING DIAG:  M72.2 (ICD-10-CM) - Plantar fasciitis  M76.61 (ICD-10-CM) - Achilles tendinitis, right leg  Evaluate and treat for 1-2 sessions / week for 4-6 weeks or at therapist's discretion. Patient has chronic plantar fasciitis, would like to include ROM, strengthening, stability, and manual therapy, dry needling if indicated. Modalities PRN at therapist's discretion.   THERAPY DIAG:  Pain of right heel  Pain in right ankle and joints of right foot  Muscle weakness (generalized)  Rationale for Evaluation and Treatment: Rehabilitation  ONSET DATE: October 2024  SUBJECTIVE:   SUBJECTIVE STATEMENT:  10/26/2023:  Pt reports that she had the MRI and it showed no discrete tear.     EVAL-I work in [employer] at [employer].  I  started having pain in my foot October 2024. 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 I started walking not normally and using the outside of my foot. I used to have a high arch and used to wear high heels but I never do now.  I had a cortisone injection 2 weeks ago. After shot I can stand for 3 hours but before I felt pain right when I get up in the morning and I put my feet on the floor. I walk about 6000 steps a day when I go back to work.  Right now I am doing about 3000. I  grew up in foster care and I am used to taking care of myself  PERTINENT HISTORY: Asthma, HTN  PAIN:  Are you having pain? Yes: NPRS scale: at rest ache 1/10 and at worst 10/10 Pain location: R heel and  lateral edge of foot over 5th metatarsal and at insertion of Plantar fascia, also over insertional achilles tendon. Pain description: achy, stabbing Aggravating factors: ,standing too long more than 15 minutes.transition from sitting too long,can't exercise, stairs Relieving factors: cortisone shot  PRECAUTIONS:  : Flagyl  [Metronidazole ]  Not Specified: Doxycycline       RED FLAGS: None   WEIGHT BEARING RESTRICTIONS: No  FALLS:  Has patient fallen in last 6 months? No  LIVING ENVIRONMENT: Lives with: lives with their family Lives in: House/apartment Stairs: definite use of hands with stairs  pt tries to avoid takes one step at a time Has following equipment at home: None  OCCUPATION: Engineer, petroleum at [employer]  PLOF: Independent  PATIENT GOALS: improve my pain and ability to stand for my work at [employer] at [employer] MD VISIT: tbd  OBJECTIVE:  Note: Objective measures were completed at Evaluation unless otherwise noted.  DIAGNOSTIC FINDINGS: RADIOLOGY Foot X-ray: No acute fracture or stress fracture. Small plantar calcaneal heel spur. (06/22/2023)  PATIENT SURVEYS:  LEFS  Extreme difficulty/unable (0), Quite a bit of difficulty (1), Moderate difficulty (2), Little difficulty (3), No difficulty (4) Survey date:  09-16-23  Any of your usual work, housework or school activities      3  2. Usual hobbies, recreational or sporting activities      3  3. Getting into/out of the bath       2  4. Walking between rooms       3  5. Putting on socks/shoes        4  6. Squatting         2  7. Lifting an object, like a bag of groceries from the floor        2  8. Performing light activities around your home          3  9. Performing heavy activities  around your home        2  10. Getting into/out of a car         2  11. Walking 2 blocks         3  12. Walking 1 mile         2  13. Going up/down 10 stairs (1 flight)          2  14. Standing for 1 hour         2  15.  sitting for 1 hour  3  16. Running on even ground        0  17. Running on uneven ground         0  18. Making sharp turns while running fast         1  19. Hopping         1  20. Rolling over in bed       4  Score total:  38/80     COGNITION: Overall cognitive status: Within functional limits for tasks assessed     SENSATION: WFL  EDEMA:  Circumferential: R and L both 46 cm   MUSCLE LENGTH: Hamstrings: bil tightness R > L   POSTURE: rounded shoulders, forward head, weight shift left, and Pt with increased pronation of right foot  PALPATION: TTP over heel and insertional achilles and plantar fascia insertion, some tenderness over lateral foot  LOWER EXTREMITY ROM:  Active ROM Right eval Left eval  Hip flexion    Hip extension    Hip abduction    Hip adduction    Hip internal rotation    Hip external rotation    Knee flexion    Knee extension 128 126  Ankle dorsiflexion 8 14  Ankle plantarflexion 39 45  Ankle inversion 34 42  Ankle eversion 15 27   (Blank rows = not tested)  LOWER EXTREMITY MMT:  MMT Right eval Left eval  Hip flexion 4+ 4+  Hip extension 4+ 4+  Hip abduction 4 4  Hip adduction    Hip internal rotation    Hip external rotation    Knee flexion 4+ 4+  Knee extension 4 4+  Ankle dorsiflexion    Ankle plantarflexion 7/25 25/25  Ankle inversion 4- 4+  Ankle eversion     (Blank rows = not tested)  LOWER EXTREMITY SPECIAL TESTS:  NT  FUNCTIONAL TESTS:  5 times sit to stand: 14.59 6 minute walk test: 922 .4 ft SL right 7 sec and Left   30 sec   GAIT: Distance walked: 922.4 ft ( Norm for age  [phone] ft) Assistive device utilized: None Level of assistance: Complete Independence Comments: Pt with  decreased stride length and antalgic gait right and collapses medial arch in gait.                                                                                                                                TREATMENT DATE: [facility] Adult PT Treatment:                                                DATE: 10/26/2023  Manual Therapy: STM and IASTM of PF  Heel pad taping with leuko tape Education regarding taping  Therapeutic Activity  Self myofacial release with tennis ball Discussion regarding progressive loading  Discussing potential benefits of more supportive shoes or inserts.   PATIENT EDUCATION:  Education details: eval issue HEP, explanation of findings and timeline of healing Person educated: Patient Education method: Explanation, Demonstration, Tactile cues, Verbal cues, and Handouts Education comprehension: verbalized understanding, returned demonstration, verbal cues required, tactile cues required, and needs further education  HOME EXERCISE PROGRAM: Access Code: [email or web address] updated URL: [email or web address] Date: 09/28/2023 Prepared by: Graydon Dingwall  Program Notes Toe Yoga  Exercises - Seated Arch Lifts  - 1 x daily - 7 x weekly - 3 sets - 10 reps - Seated Plantar Fascia Stretch  - 1 x daily - 7 x weekly - 3 sets - 10 reps - Standing heel raise towel under Big toe  - 1 x daily - 7 x weekly - 3 sets - 10 reps - Seated Figure 4 Ankle Inversion with Resistance  - 1 x daily - 7 x weekly - 3 sets - 10 reps - single limb stance when brushing teeth 60 sec each leg  - 1 x daily - 7 x weekly - 1 sets - 2 min hold - seated heel raise with weights  - 1 x daily - 7 x weekly - 3 sets - 10 reps - Clamshell with Resistance  - 1-15 x daily - 7 x weekly - 2 sets - 10 reps - Alternating Single Leg Bridge  - 1 x daily - 7 x weekly - 3 sets - 10 reps  ASSESSMENT:  CLINICAL IMPRESSION: 10/26/2023:  Pt tolerated session well.  Pt reports overall improvement since  starting PT with noted reduced severe pain first thing in the morning.  She is in a tough situation with work where she is unable to wear a boot during the day and must stand for many hours.  Educated on taping techniques to improve comfort and potential benefits of more supportive shoes.  Pt plans on saving up money for more supportive shoes when able.   EVAL- Patient is a 48 y.o. female who was seen today for physical therapy evaluation and treatment for Right plantar fasciitis and achilles tendonitis. 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  Pt has chronic condition and would benefit from skilled PT to improve impairments and access knowledge for pain management and strengthening and improving for more efficient gait.  OBJECTIVE IMPAIRMENTS: decreased activity tolerance, decreased knowledge of condition, decreased mobility, difficulty walking, decreased ROM, decreased strength, increased fascial restrictions, improper body mechanics, and pain.   ACTIVITY LIMITATIONS: bending, standing, squatting, stairs, transfers, locomotion level, and caring for others  PARTICIPATION LIMITATIONS: meal prep, cleaning, laundry, driving, and occupation  PERSONAL FACTORS: Time since onset of injury/illness/exacerbation are also affecting patient's functional outcome.   REHAB POTENTIAL: Good  CLINICAL DECISION MAKING: Stable/uncomplicated  EVALUATION COMPLEXITY: Low   GOALS: Goals reviewed with patient? Yes  SHORT TERM GOALS: Target date: 10-14-23 Pt will be independent with initial HEP Baseline:no knowledge Goal status: ONGOING  2.  Pt pain will reduce from 10/10 to 6/10 functional activities Baseline: 10/10 with functional activities 09-28-23 5/10 dull ache Goal status:ONGOING  3.  Pt will be able to perform standing SL heel raise on RT 15/25 x Baseline: 7 x on eval with 10/10 pain Goal status: ONGOING  4.  Pt will improve DF to at least  12 degrees to show improvement in flexibilty Baseline: 8 degrees at eval Goal status: ONGOING    LONG TERM GOALS: Target  date: 11-11-23  Pt will be independent with advanced HEP  Baseline: no knowledge Goal status: INITIAL  2.  Pt will be able to tolerate 6 min walk test within normal limits (at least 1635 ft to 1980 ft) Baseline: 922.4 ft Goal status: INITIAL  3.  Pt will be able to negotiate steps without exacerbation of pain greater than 3/10  Baseline: Pt avoids steps due to pain Goal status: INITIAL  4.  LEFS score will improve by at least 12 ponts MCID to  50 /80 to show functional improvement Baseline: 38/80 Goal status: INITIAL  5.  Increase R ankle gross strength to >/= 4+/5 with </=3/10 max pain during testing to demo improvement in ankle strength/ stability.  Baseline: See AROM chart Goal status: INITIAL  6.  Pt will be more aware of her gait and bearing weight through her 1st ray/great toe ball of  right foot Baseline: Pt tends to collapse medial arch in gait Goal status: INITIAL  7. Pt will be able to stand for greater than 4 hours in order to return to work  Baseline:  Pain with 15 minutes of standing  Goal status INITIAL   PLAN:  PT FREQUENCY: 1-2x/week  PT DURATION: 8 weeks  PLANNED INTERVENTIONS: 97164- PT Re-evaluation, 97750- Physical Performance Testing, 97110-Therapeutic exercises, 97530- Therapeutic activity, [id number]- Neuromuscular re-education, 97535- Self Care, [id number]- Manual therapy, [id number]- Gait training, [phone]- Electrical stimulation (manual), [phone] (1-2 muscles), 20561 (3+ muscles)- Dry Needling, Patient/Family education, Balance training, Stair training, Taping, Joint mobilization, Spinal mobilization, Cryotherapy, and Moist heat  PLAN FOR NEXT SESSION: Manual  TPDN, progress HEP  possible fat pad taping of heel  Helene BRAVO Joson Sapp PT 10/26/23 4:40 PM Phone: [phone] Fax: [phone]

## 2023-10-27 ENCOUNTER — Encounter: Payer: Self-pay | Admitting: Gastroenterology

## 2023-10-27 ENCOUNTER — Other Ambulatory Visit (INDEPENDENT_AMBULATORY_CARE_PROVIDER_SITE_OTHER)

## 2023-10-27 ENCOUNTER — Ambulatory Visit: Admitting: Gastroenterology

## 2023-10-27 VITALS — BP 160/88 | HR 90 | Ht 63.0 in | Wt 167.4 lb

## 2023-10-27 DIAGNOSIS — K219 Gastro-esophageal reflux disease without esophagitis: Secondary | ICD-10-CM | POA: Diagnosis not present

## 2023-10-27 DIAGNOSIS — K649 Unspecified hemorrhoids: Secondary | ICD-10-CM

## 2023-10-27 DIAGNOSIS — Z1211 Encounter for screening for malignant neoplasm of colon: Secondary | ICD-10-CM

## 2023-10-27 DIAGNOSIS — R141 Gas pain: Secondary | ICD-10-CM

## 2023-10-27 DIAGNOSIS — R6881 Early satiety: Secondary | ICD-10-CM | POA: Diagnosis not present

## 2023-10-27 DIAGNOSIS — K625 Hemorrhage of anus and rectum: Secondary | ICD-10-CM | POA: Diagnosis not present

## 2023-10-27 LAB — CBC WITH DIFFERENTIAL/PLATELET
Basophils Absolute: 0 K/uL (ref 0.0–0.1)
Basophils Relative: 0.4 % (ref 0.0–3.0)
Eosinophils Absolute: 0.1 K/uL (ref 0.0–0.7)
Eosinophils Relative: 1.1 % (ref 0.0–5.0)
HCT: 42.5 % (ref 36.0–46.0)
Hemoglobin: 14.6 g/dL (ref 12.0–15.0)
Lymphocytes Relative: 32.1 % (ref 12.0–46.0)
Lymphs Abs: 2.6 K/uL (ref 0.7–4.0)
MCHC: 34.3 g/dL (ref 30.0–36.0)
MCV: 95.7 fl (ref 78.0–100.0)
Monocytes Absolute: 0.6 K/uL (ref 0.1–1.0)
Monocytes Relative: 7.9 % (ref 3.0–12.0)
Neutro Abs: 4.8 K/uL (ref 1.4–7.7)
Neutrophils Relative %: 58.5 % (ref 43.0–77.0)
Platelets: 275 K/uL (ref 150.0–400.0)
RBC: 4.44 Mil/uL (ref 3.87–5.11)
RDW: 13 % (ref 11.5–15.5)
WBC: 8.1 K/uL (ref 4.0–10.5)

## 2023-10-27 LAB — COMPREHENSIVE METABOLIC PANEL WITH GFR
ALT: 16 U/L (ref 0–35)
AST: 11 U/L (ref 0–37)
Albumin: 4.5 g/dL (ref 3.5–5.2)
Alkaline Phosphatase: 67 U/L (ref 39–117)
BUN: 14 mg/dL (ref 6–23)
CO2: 27 meq/L (ref 19–32)
Calcium: 9.3 mg/dL (ref 8.4–10.5)
Chloride: 104 meq/L (ref 96–112)
Creatinine, Ser: 0.74 mg/dL (ref 0.40–1.20)
GFR: 96.93 mL/min (ref >60.00)
Glucose, Bld: 92 mg/dL (ref 70–99)
Potassium: 3.8 meq/L (ref 3.5–5.1)
Sodium: 139 meq/L (ref 135–145)
Total Bilirubin: 0.5 mg/dL (ref 0.2–1.2)
Total Protein: 7.5 g/dL (ref 6.0–8.3)

## 2023-10-27 MED ORDER — NA SULFATE-K SULFATE-MG SULF 17.5-3.13-1.6 GM/177ML PO SOLN: 1.0000 | Freq: Once | ORAL | 0 refills | Status: AC

## 2023-10-27 MED ORDER — OMEPRAZOLE 40 MG PO CPDR: 40.0000 mg | DELAYED_RELEASE_CAPSULE | Freq: Every day | ORAL | 3 refills | Status: DC

## 2023-10-27 NOTE — Patient Instructions (Addendum)
 Your provider has requested that you go to the basement level for lab work before leaving today. Press B on the elevator. The lab is located at the first door on the left as you exit the elevator.  We have sent the following medications to your pharmacy for you to pick up at your convenience:  Omeprazole   You have been scheduled for an endoscopy and colonoscopy. Please follow the written instructions given to you at your visit today.  If you use inhalers (even only as needed), please bring them with you on the day of your procedure.  DO NOT TAKE 7 DAYS PRIOR TO TEST- Trulicity (dulaglutide) Ozempic, Wegovy (semaglutide) Mounjaro (tirzepatide) Bydureon Bcise (exanatide extended release)  DO NOT TAKE 1 DAY PRIOR TO YOUR TEST Rybelsus (semaglutide) Adlyxin (lixisenatide) Victoza (liraglutide) Byetta (exanatide) ___________________________________________________________________________ _______________________________________________________  If your blood pressure at your visit was 140/90 or greater, please contact your primary care physician to follow up on this.  _______________________________________________________  If you are age 46 or older, your body mass index should be between 23-30. Your Body mass index is 29.65 kg/m. If this is out of the aforementioned range listed, please consider follow up with your Primary Care Provider.  If you are age 46 or younger, your body mass index should be between 19-25. Your Body mass index is 29.65 kg/m. If this is out of the aformentioned range listed, please consider follow up with your Primary Care Provider.   ________________________________________________________  The [facility] providers would like to encourage you to use MYCHART to communicate with providers for non-urgent requests or questions.  Due to long hold times on the telephone, sending your provider a message by [facility] may be a faster and more efficient way to get a  response.  Please allow 48 business hours for a response.  Please remember that this is for non-urgent requests.  _______________________________________________________  [facility] is using a team-based approach to care.  Your team is made up of your doctor and two to three APPS. Our APPS (Nurse Practitioners and Physician Assistants) work with your physician to ensure care continuity for you. They are fully qualified to address your health concerns and develop a treatment plan. They communicate directly with your gastroenterologist to care for you. Seeing the Advanced Practice Practitioners on your physician's team can help you by facilitating care more promptly, often allowing for earlier appointments, access to diagnostic testing, procedures, and other specialty referrals.

## 2023-10-27 NOTE — Progress Notes (Signed)
 Gail Bass [id number] [birth year]   Chief Complaint: Hemorrhoids  Referring Provider: Janna Ferrier, DO Primary GI MD: Unassigned  HPI: Gail Bass is a 48 y.o. female with past medical history of asthma, HTN, kidney stones, ovarian cyst, tubal pregnancy s/p tubal ligation who presents today for a complaint of hemorrhoids and rectal bleeding.    Patient states she has had problems with hemorrhoids since age 80, when she had her first daughter.  Gradually over the years symptoms have gotten worse.  States that 1 hemorrhoid in particular gives her trouble.  Has history of thrombosed hemorrhoid.  States she has been previously prescribed Anusol  suppositories but these were not covered by her insurance.  Has been using Preparation H wipes and OTC suppositories for a long time.  Sometimes being on her period can make her hemorrhoids flare up.  Also notices worsening discomfort if she is on antibiotics and having side effect of diarrhea.  Reports history of frequent antibiotic use due to recurrent bacterial vaginosis.  She has some occasional abdominal bloating.  Also endorses early satiety.  States she can only eat a few bites of food sometimes before feeling full.  Has been taking Pepcid  as recommended by one of her other doctors and this has helped with some bloating and gas pain in her RLQ.  States she cannot tolerate garlic, onions, dairy, salads, and fresh fruits and vegetables.  Does eat a lot of processed food due to busy lifestyle.  Reports that she alternates between diarrhea and constipation.  Has been seeing blood on the toilet paper and occasionally in the stool as well, usually with hemorrhoid flares.  No prior colonoscopy.  States that she has been advised to schedule colonoscopy for a few years now but has been anxious about the procedure.  She reports terrible acid reflux and states she has to sleep with multiple pillows.  Can choke on acid/regurgitation in her sleep.   Has burning in her throat when this occurs.  Pepcid  is helping with heartburn/acid reflux as well as with abdominal gas pains.  States she used to eat Tums by the bottle.  States she was placed in foster care at age 45 and does not know much of her family history on her father side.  Does know that a maternal aunt had diverticulitis.  Another maternal aunt recently had cancerous colon polyps as well as endometrial cancer.  Denies history of MI/stroke.  Denies chest pain or shortness of breath.  Previous GI Procedures/Imaging      Past Medical History:  Diagnosis Date   Abscess of buttock 08/13/2019   Asthma    Bacterial vaginosis    Boil 06/14/2019   BV (bacterial vaginosis) 09/13/2008        Hypertension 05/11/2013   Kidney stone    OVARIAN CYST, LEFT 10/29/2008   Qualifier: Diagnosis of  By: Lucille  MD, Taineisha     Pain in the chest 06/06/2015   Painful lumpy right breast 03/21/2013   Recurrent cold sores 12/05/2015   Recurrent sinusitis 06/06/2015   Sinus congestion 04/25/2018   TUBAL PREGNANCY 09/13/2008   Qualifier: History of  By: Lucille  MD, Taineisha     Vaginal discharge 01/10/2014   Vaginal discharge 01/10/2014    Past Surgical History:  Procedure Laterality Date   CYSTECTOMY     CYSTOSCOPY WITH RETROGRADE PYELOGRAM, URETEROSCOPY AND STENT PLACEMENT Left 02/22/2014   Procedure: CYSTOSCOPY WITH RETROGRADE PYELOGRAM, URETEROSCOPY, STONE BASKET EXTRACTION ;  Surgeon: Arlena LILLETTE Gal,  MD;  Location: [facility];  Service: Urology;  Laterality: Left;   tailbone cyst     TUBAL LIGATION      Current Outpatient Medications  Medication Sig Dispense Refill   acyclovir  (ZOVIRAX ) 400 MG tablet Take 1 tablet (400 mg total) by mouth 2 (two) times daily. 180 tablet 0   albuterol  (VENTOLIN  HFA) 108 (90 Base) MCG/ACT inhaler Inhale 2 puffs into the lungs every 6 (six) hours as needed. 18 g 3   cetirizine  (ZYRTEC ) 10 MG tablet TAKE 1 TABLET(10 MG) BY MOUTH DAILY 90 tablet 1    famotidine  (PEPCID ) 40 MG tablet Take 1 tablet (40 mg total) by mouth daily. 30 tablet 0   fluticasone  (FLONASE ) 50 MCG/ACT nasal spray SHAKE LIQUID AND USE 2 SPRAYS IN EACH NOSTRIL DAILY 16 g 12   hydrocortisone  (ANUSOL -HC) 2.5 % rectal cream Place 1 Application rectally 2 (two) times daily. 30 g 1   ibuprofen  (ADVIL ) 600 MG tablet Take 1 tablet (600 mg total) by mouth every 6 (six) hours as needed. 30 tablet 0   LYSINE  PO Take by mouth.     meloxicam  (MOBIC ) 15 MG tablet Take 1 tablet (15 mg total) by mouth daily. 30 tablet 3   Multiple Vitamins-Minerals (ULTRA WOMENS PACK PO) Take by mouth.     NIFEdipine  (PROCARDIA -XL/NIFEDICAL-XL) 30 MG 24 hr tablet Take 1 tablet (30 mg total) by mouth daily. 90 tablet 3   norethindrone  (ORTHO MICRONOR ) 0.35 MG tablet Take 1 tablet (0.35 mg total) by mouth daily. 28 tablet 11   ondansetron  (ZOFRAN ) 4 MG tablet Take 4 mg by mouth every 8 (eight) hours as needed.     Probiotic Product (PROBIOTIC PO) Take by mouth.     SODIUM FLUORIDE  5000 PLUS 1.1 % CREA dental cream Place 1 Application onto teeth at bedtime.     No current facility-administered medications for this visit.    Allergies as of 10/27/2023 - Review Complete 10/27/2023  Allergen Reaction Noted   Flagyl  [metronidazole ] Other (See Comments) 01/15/2023   Doxycycline   03/29/2009    Family History  Problem Relation Age of Onset   Heart Problems Mother    Heart failure Mother    Arrhythmia Father        Pacemakers placed didn't take passed away 3 moths later   Arrhythmia Sister        Pacemaker didn't take to body passed away 3 months ;later   Heart failure Maternal Grandmother    Hypertension Maternal Grandfather    Hypertension Maternal Aunt    Cervical cancer Maternal Aunt        stage 4 type B   Endometrial cancer Maternal Aunt    Endometriosis Maternal Aunt    Colon cancer Maternal Aunt    Colon polyps Maternal Aunt    Colon cancer Maternal Uncle    Colon cancer Maternal Uncle      Social History   Tobacco Use   Smoking status: Every Day    Current packs/day: 0.50    Average packs/day: 0.5 packs/day for 10.9 years (5.5 ttl pk-yrs)    Types: Cigarettes    Start date: 11/19/2012    Passive exposure: Current   Smokeless tobacco: Never   Tobacco comments:    Reports attempting cessation with patches.   Vaping Use   Vaping status: Never Used  Substance Use Topics   Alcohol use: No   Drug use: Yes    Types: Marijuana    Comment: CBD vape  Review of Systems:    Constitutional: No unexplained weight loss, fever, chills Cardiovascular: No chest pain Respiratory: No SOB  Gastrointestinal: See HPI and otherwise negative   Physical Exam:  Vital signs: BP (!) 160/88 (BP Location: Left Arm, Patient Position: Sitting, Cuff Size: Normal)   Pulse 90   Ht 5' 3 (1.6 m)   Wt 167 lb 6 oz (75.9 kg)   BMI 29.65 kg/m   Constitutional: Pleasant overweight female in NAD, alert and cooperative Head:  Normocephalic and atraumatic.  Eyes: No scleral icterus.  Respiratory: Respirations even and unlabored. Lungs clear to auscultation bilaterally.  No wheezes, crackles, or rhonchi.  Cardiovascular:  Regular rate and rhythm. No murmurs. No peripheral edema. Gastrointestinal:  Soft, nondistended, nontender. No rebound or guarding. Normal bowel sounds. No appreciable masses or hepatomegaly. Rectal: Nonbleeding, nonthrombosed external hemorrhoids.  No fissures seen.  Chaperone present for exam Neurologic:  Alert and oriented x4;  grossly normal neurologically.  Skin:   Dry and intact without significant lesions or rashes. Psychiatric: Oriented to person, place and time. Demonstrates good judgement and reason without abnormal affect or behaviors.   RELEVANT LABS AND IMAGING: CBC    Component Value Date/Time   WBC 9.2 01/13/2023 1511   RBC 4.60 01/13/2023 1511   HGB 15.0 01/13/2023 1511   HGB 14.6 05/08/2022 1703   HCT 43.8 01/13/2023 1511   HCT 42.1 05/08/2022 1703    PLT 288 01/13/2023 1511   PLT 274 05/08/2022 1703   MCV 95.2 01/13/2023 1511   MCV 94 05/08/2022 1703   MCH 32.6 01/13/2023 1511   MCHC 34.2 01/13/2023 1511   RDW 12.6 01/13/2023 1511   RDW 12.0 05/08/2022 1703   LYMPHSABS 2.2 10/01/2017 1141   MONOABS 380 06/05/2015 0917   EOSABS 0.1 10/01/2017 1141   BASOSABS 0.0 10/01/2017 1141    CMP     Component Value Date/Time   NA 141 04/28/2022 1714   K 4.2 04/28/2022 1714   CL 103 04/28/2022 1714   CO2 23 04/28/2022 1714   GLUCOSE 92 04/28/2022 1714   GLUCOSE 91 12/27/2015 1024   BUN 14 04/28/2022 1714   CREATININE 0.66 04/28/2022 1714   CREATININE 0.66 12/27/2015 1024   CALCIUM  9.7 04/28/2022 1714   PROT 6.6 06/05/2015 0917   ALBUMIN 4.0 06/05/2015 0917   AST 13 06/05/2015 0917   ALT 25 06/05/2015 0917   ALKPHOS 53 06/05/2015 0917   BILITOT 0.5 06/05/2015 0917   GFRNONAA 105 04/10/2020 1641   GFRNONAA >89 12/27/2015 1024   GFRAA 121 04/10/2020 1641   GFRAA >89 12/27/2015 1024     Assessment/Plan:   Screening for colon cancer Hemorrhoids Rectal bleeding Patient seen today to schedule initial screening colonoscopy and for complaint of hemorrhoids.  Has some intermittent BRBPR when hemorrhoids flare.  Has persistent problems with hemorrhoids since she was 20 and has been using OTC suppositories and Preparation H as needed.  Was previously prescribed Anusol  suppositories but states they were too expensive.  Reports she can have alternating diarrhea and constipation and has some abdominal gas and bloating with certain foods such as garlic, onions, and dairy.  Recently started Pepcid  for acid reflux and heartburn, and this has also improved gas pains.  Reports intermittent RLQ abdominal pain which she attributes to gas.  - Schedule colonoscopy. I thoroughly discussed the procedure with the patient to include nature of the procedure, alternatives, benefits, and risks (including but not limited to bleeding, infection, perforation,  anesthesia/cardiac/pulmonary complications). Patient verbalized  understanding and gave verbal consent to proceed with procedure.  - Continue OTC preparation H and suppositories as needed for hemorrhoids  Abdominal gas Early satiety GERD Patient reports abdominal gas and bloating, particularly with certain dietary triggers.  Has had acid reflux/heartburn which can be severe.  Has frequent nighttime symptoms and has to sleep on multiple pillows to elevate her head.  States that reflux can wake her up from sleep with choking and burning in her throat.  Has had some improvement on Pepcid , though continues to have early satiety.  No prior EGD.  - Schedule EGD to be done with colonoscopy.  - Start omeprazole  40 mg daily - Labs today: CBC, CMP - Start Omeprazole  40 mg daily - Can continue Pepcid  40 mg in the evening   Camie Furbish, PA-C [facility] 10/27/2023, 2:22 PM  Patient Care Team: Janna Ferrier, DO as PCP - General Dallie Vera GAILS, MD as Consulting Physician (Obstetrics and Gynecology)

## 2023-10-28 ENCOUNTER — Ambulatory Visit: Payer: Self-pay | Admitting: Gastroenterology

## 2023-11-02 ENCOUNTER — Encounter: Payer: Self-pay | Admitting: Physical Therapy

## 2023-11-02 ENCOUNTER — Ambulatory Visit: Attending: Podiatry | Admitting: Physical Therapy

## 2023-11-02 DIAGNOSIS — M79671 Pain in right foot: Secondary | ICD-10-CM | POA: Insufficient documentation

## 2023-11-02 DIAGNOSIS — M25571 Pain in right ankle and joints of right foot: Secondary | ICD-10-CM | POA: Insufficient documentation

## 2023-11-02 DIAGNOSIS — M6281 Muscle weakness (generalized): Secondary | ICD-10-CM | POA: Diagnosis present

## 2023-11-02 NOTE — Therapy (Signed)
 OUTPATIENT PHYSICAL THERAPY LOWER EXTREMITY TREATMENT   Patient Name: Gail Bass MRN: [id number] DOB:1975-07-17, 48 y.o., female Today's Date: 11/02/2023  END OF SESSION:  PT End of Session - 11/02/23 1500     Visit Number 5    Number of Visits 16    Date for PT Re-Evaluation 11/11/23    Authorization Type [employer] MCD    PT Start Time 1500    PT Stop Time 1541    PT Time Calculation (min) 41 min    Activity Tolerance Patient tolerated treatment well;Patient limited by pain    Behavior During Therapy [facility] for tasks assessed/performed           Past Medical History:  Diagnosis Date   Abscess of buttock 08/13/2019   Asthma    Bacterial vaginosis    Boil 06/14/2019   BV (bacterial vaginosis) 09/13/2008        Hypertension 05/11/2013   Kidney stone    OVARIAN CYST, LEFT 10/29/2008   Qualifier: Diagnosis of  By: Lucille  MD, Taineisha     Pain in the chest 06/06/2015   Painful lumpy right breast 03/21/2013   Recurrent cold sores 12/05/2015   Recurrent sinusitis 06/06/2015   Sinus congestion 04/25/2018   TUBAL PREGNANCY 09/13/2008   Qualifier: History of  By: Lucille  MD, Taineisha     Vaginal discharge 01/10/2014   Vaginal discharge 01/10/2014   Past Surgical History:  Procedure Laterality Date   CYSTECTOMY     CYSTOSCOPY WITH RETROGRADE PYELOGRAM, URETEROSCOPY AND STENT PLACEMENT Left 02/22/2014   Procedure: CYSTOSCOPY WITH RETROGRADE PYELOGRAM, URETEROSCOPY, STONE BASKET EXTRACTION ;  Surgeon: Arlena LILLETTE Gal, MD;  Location: [facility];  Service: Urology;  Laterality: Left;   tailbone cyst     TUBAL LIGATION     Patient Active Problem List   Diagnosis Date Noted   Chronic pelvic pain in female 01/13/2023   Abnormal uterine bleeding (AUB) 12/30/2022   History of ectopic pregnancy 12/30/2022   Screening for cervical cancer 09/24/2022   Screening for colon cancer 09/24/2022   Screening examination for STD (sexually transmitted disease) 09/24/2022   Hot flashes 05/10/2022    Milia of right eyelid 08/27/2021   Viral illness 02/27/2021   Back pain 02/13/2021   Overactive bladder 07/03/2020   Dyspareunia, female 07/03/2020   Mixed stress and urge urinary incontinence 04/11/2020   History of COVID-19 03/13/2020   Seasonal allergies 06/14/2019   PTSD (post-traumatic stress disorder) 12/05/2015   External hemorrhoids 08/07/2015   Vaginal discharge 01/10/2014   Hypertension 05/11/2013   Tobacco use disorder 03/21/2013   Asthma 12/23/2009   Migraine headache 09/13/2008   Depression 06/21/2008    PCP: Janna Ferrier, DO   REFERRING PROVIDER: Silva Juliene SAUNDERS, DPM   REFERRING DIAG:  M72.2 (ICD-10-CM) - Plantar fasciitis  M76.61 (ICD-10-CM) - Achilles tendinitis, right leg  Evaluate and treat for 1-2 sessions / week for 4-6 weeks or at therapist's discretion. Patient has chronic plantar fasciitis, would like to include ROM, strengthening, stability, and manual therapy, dry needling if indicated. Modalities PRN at therapist's discretion.   THERAPY DIAG:  Pain of right heel  Pain in right ankle and joints of right foot  Muscle weakness (generalized)  Rationale for Evaluation and Treatment: Rehabilitation  ONSET DATE: October 2024  SUBJECTIVE:   SUBJECTIVE STATEMENT:  11/02/2023:  Pt reports that she has been doing well after a long weekend wearing the boot and relative rest.  Reports that she is no longer having severe  pain when first walking.  Has been HEP compliant.     EVAL-I work in [employer] at [employer].  I started having pain in my foot October 2024. 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 I started walking not normally and using the outside of my foot. I used to have a high arch and used to wear high heels but I never do now.  I had a cortisone injection 2 weeks ago. After shot I can stand for 3 hours but before I felt pain right when I get up in the  morning and I put my feet on the floor. I walk about 6000 steps a day when I go back to work.  Right now I am doing about 3000. I grew up in foster care and I am used to taking care of myself  PERTINENT HISTORY: Asthma, HTN  PAIN:  Are you having pain? Yes: NPRS scale: at rest ache 1/10 and at worst 10/10 Pain location: R heel and  lateral edge of foot over 5th metatarsal and at insertion of Plantar fascia, also over insertional achilles tendon. Pain description: achy, stabbing Aggravating factors: ,standing too long more than 15 minutes.transition from sitting too long,can't exercise, stairs Relieving factors: cortisone shot  PRECAUTIONS:  : Flagyl  [Metronidazole ]  Not Specified: Doxycycline       RED FLAGS: None   WEIGHT BEARING RESTRICTIONS: No  FALLS:  Has patient fallen in last 6 months? No  LIVING ENVIRONMENT: Lives with: lives with their family Lives in: House/apartment Stairs: definite use of hands with stairs  pt tries to avoid takes one step at a time Has following equipment at home: None  OCCUPATION: Engineer, petroleum at [employer]  PLOF: Independent  PATIENT GOALS: improve my pain and ability to stand for my work at [employer] at [employer] MD VISIT: tbd  OBJECTIVE:  Note: Objective measures were completed at Evaluation unless otherwise noted.  DIAGNOSTIC FINDINGS: RADIOLOGY Foot X-ray: No acute fracture or stress fracture. Small plantar calcaneal heel spur. (06/22/2023)  PATIENT SURVEYS:  LEFS  Extreme difficulty/unable (0), Quite a bit of difficulty (1), Moderate difficulty (2), Little difficulty (3), No difficulty (4) Survey date:  09-16-23  Any of your usual work, housework or school activities      3  2. Usual hobbies, recreational or sporting activities      3  3. Getting into/out of the bath       2  4. Walking between rooms       3  5. Putting on socks/shoes        4  6. Squatting         2  7. Lifting an object, like a bag of  groceries from the floor        2  8. Performing light activities around your home          3  9. Performing heavy activities around your home        2  10. Getting into/out of a car         2  11. Walking 2 blocks         3  12. Walking 1 mile         2  13. Going up/down 10 stairs (1 flight)          2  14. Standing  for 1 hour         2  15.  sitting for 1 hour        3  16. Running on even ground        0  17. Running on uneven ground         0  18. Making sharp turns while running fast         1  19. Hopping         1  20. Rolling over in bed       4  Score total:  38/80     COGNITION: Overall cognitive status: Within functional limits for tasks assessed     SENSATION: WFL  EDEMA:  Circumferential: R and L both 46 cm   MUSCLE LENGTH: Hamstrings: bil tightness R > L   POSTURE: rounded shoulders, forward head, weight shift left, and Pt with increased pronation of right foot  PALPATION: TTP over heel and insertional achilles and plantar fascia insertion, some tenderness over lateral foot  LOWER EXTREMITY ROM:  Active ROM Right eval Left eval  Hip flexion    Hip extension    Hip abduction    Hip adduction    Hip internal rotation    Hip external rotation    Knee flexion    Knee extension 128 126  Ankle dorsiflexion 8 14  Ankle plantarflexion 39 45  Ankle inversion 34 42  Ankle eversion 15 27   (Blank rows = not tested)  LOWER EXTREMITY MMT:  MMT Right eval Left eval  Hip flexion 4+ 4+  Hip extension 4+ 4+  Hip abduction 4 4  Hip adduction    Hip internal rotation    Hip external rotation    Knee flexion 4+ 4+  Knee extension 4 4+  Ankle dorsiflexion    Ankle plantarflexion 7/25 25/25  Ankle inversion 4- 4+  Ankle eversion     (Blank rows = not tested)  LOWER EXTREMITY SPECIAL TESTS:  NT  FUNCTIONAL TESTS:  5 times sit to stand: 14.59 6 minute walk test: 922 .4 ft SL right 7 sec and Left   30 sec   GAIT: Distance walked: 922.4 ft  ( Norm for age  [phone] ft) Assistive device utilized: None Level of assistance: Complete Independence Comments: Pt with decreased stride length and antalgic gait right and collapses medial arch in gait.                                                                                                                                TREATMENT DATE: [facility] Adult PT Treatment:                                                DATE: 11/02/2023  Manual Therapy: STM and IASTM  of PF  Heel pad taping with leuko tape  Therapeutic Activity  Heel raise isometrics with towel under toes Continued discussion regarding progressive loading Continued discussion on possible benefits of more supportive foot wear and/or orthotics   HOME EXERCISE PROGRAM: Access Code: [email or web address] updated URL: [email or web address] Date: 09/28/2023 Prepared by: Graydon Dingwall  Program Notes Toe Yoga  Exercises - Seated Arch Lifts  - 1 x daily - 7 x weekly - 3 sets - 10 reps - Seated Plantar Fascia Stretch  - 1 x daily - 7 x weekly - 3 sets - 10 reps - Standing heel raise towel under Big toe  - 1 x daily - 7 x weekly - 3 sets - 10 reps - Seated Figure 4 Ankle Inversion with Resistance  - 1 x daily - 7 x weekly - 3 sets - 10 reps - single limb stance when brushing teeth 60 sec each leg  - 1 x daily - 7 x weekly - 1 sets - 2 min hold - seated heel raise with weights  - 1 x daily - 7 x weekly - 3 sets - 10 reps - Clamshell with Resistance  - 1-15 x daily - 7 x weekly - 2 sets - 10 reps - Alternating Single Leg Bridge  - 1 x daily - 7 x weekly - 3 sets - 10 reps  ASSESSMENT:  CLINICAL IMPRESSION: 11/02/2023:  Pt tolerated session well.  Pt reports continued slow improvement in heel pain.  Added in heel raise isometric holds today with pt noting pulling at heel but no significant increase when exercise is performed bil.  Encouraged her to integrate this into routine vs higher rep concentric heel raises.   EVAL-  Patient is a 48 y.o. female who was seen today for physical therapy evaluation and treatment for Right plantar fasciitis and achilles tendonitis. 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  Pt has chronic condition and would benefit from skilled PT to improve impairments and access knowledge for pain management and strengthening and improving for more efficient gait.  OBJECTIVE IMPAIRMENTS: decreased activity tolerance, decreased knowledge of condition, decreased mobility, difficulty walking, decreased ROM, decreased strength, increased fascial restrictions, improper body mechanics, and pain.   ACTIVITY LIMITATIONS: bending, standing, squatting, stairs, transfers, locomotion level, and caring for others  PARTICIPATION LIMITATIONS: meal prep, cleaning, laundry, driving, and occupation  PERSONAL FACTORS: Time since onset of injury/illness/exacerbation are also affecting patient's functional outcome.   REHAB POTENTIAL: Good  CLINICAL DECISION MAKING: Stable/uncomplicated  EVALUATION COMPLEXITY: Low   GOALS: Goals reviewed with patient? Yes  SHORT TERM GOALS: Target date: 10-14-23 Pt will be independent with initial HEP Baseline:no knowledge Goal status: ONGOING  2.  Pt pain will reduce from 10/10 to 6/10 functional activities Baseline: 10/10 with functional activities 09-28-23 5/10 dull ache Goal status:ONGOING  3.  Pt will be able to perform standing SL heel raise on RT 15/25 x Baseline: 7 x on eval with 10/10 pain Goal status: ONGOING  4.  Pt will improve DF to at least 12 degrees to show improvement in flexibilty Baseline: 8 degrees at eval Goal status: ONGOING    LONG TERM GOALS: Target date: 11-11-23  Pt will be independent with advanced HEP  Baseline: no knowledge Goal status: INITIAL  2.  Pt will be able to tolerate 6 min walk test within normal limits (at least 1635 ft to 1980 ft) Baseline: 922.4 ft  Goal  status: INITIAL  3.  Pt will be able to negotiate steps without exacerbation of pain greater than 3/10  Baseline: Pt avoids steps due to pain Goal status: INITIAL  4.  LEFS score will improve by at least 12 ponts MCID to  50 /80 to show functional improvement Baseline: 38/80 Goal status: INITIAL  5.  Increase R ankle gross strength to >/= 4+/5 with </=3/10 max pain during testing to demo improvement in ankle strength/ stability.  Baseline: See AROM chart Goal status: INITIAL  6.  Pt will be more aware of her gait and bearing weight through her 1st ray/great toe ball of  right foot Baseline: Pt tends to collapse medial arch in gait Goal status: INITIAL  7. Pt will be able to stand for greater than 4 hours in order to return to work  Baseline:  Pain with 15 minutes of standing  Goal status INITIAL   PLAN:  PT FREQUENCY: 1-2x/week  PT DURATION: 8 weeks  PLANNED INTERVENTIONS: 97164- PT Re-evaluation, 97750- Physical Performance Testing, 97110-Therapeutic exercises, 97530- Therapeutic activity, [id number]- Neuromuscular re-education, 97535- Self Care, [id number]- Manual therapy, [id number]- Gait training, [phone]- Electrical stimulation (manual), [phone] (1-2 muscles), 20561 (3+ muscles)- Dry Needling, Patient/Family education, Balance training, Stair training, Taping, Joint mobilization, Spinal mobilization, Cryotherapy, and Moist heat  PLAN FOR NEXT SESSION: Manual  TPDN, progress HEP  possible fat pad taping of heel  Helene BRAVO Wade Asebedo PT 11/02/23 3:58 PM Phone: [phone] Fax: [phone]

## 2023-11-04 ENCOUNTER — Ambulatory Visit (INDEPENDENT_AMBULATORY_CARE_PROVIDER_SITE_OTHER): Admitting: Podiatry

## 2023-11-04 VITALS — Ht 63.0 in | Wt 167.4 lb

## 2023-11-04 DIAGNOSIS — M722 Plantar fascial fibromatosis: Secondary | ICD-10-CM

## 2023-11-05 NOTE — Progress Notes (Signed)
  Subjective:  Patient ID: Gail Bass, female    DOB: 1975-10-06,  MRN: [id number]  Chief Complaint  Patient presents with   Plantar Fasciitis    RM 9 recheck plantar fasciitis, after MRI to review. Patient states physical therapy and the walking boot has helped to relieve some pain. Patient states pain is more intense after prolonged standing and walking.     Discussed the use of AI scribe software for clinical note transcription with the patient, who gave verbal consent to proceed.  History of Present Illness Doing much better the boot is helping is much as possible.  She completed the MRI      Objective:    Physical Exam VASCULAR: DP and PT pulse palpable. Foot is warm and well-perfused. Capillary fill time is brisk. DERMATOLOGIC: Normal skin turgor, texture, and temperature. No open lesions, rashes, or ulcerations. NEUROLOGIC: Normal sensation to light touch and pressure. No paresthesias. ORTHOPEDIC: Sharp pain on palpation of plantar medial band of plantar fascia   No images are attached to the encounter.    Results Procedure: Corticosteroid injection Description: The right heel was prepped with alcohol and ethyl chloride spray was used as a topical anesthetic. The insertion of the medial band of the plantar fascia was injected with 20 mg of Kenalog , 4 mg of dexamethasone , and 5 mg of Marcaine. The patient tolerated the procedure well. Informed Consent: Consent and a discussion of the risks, benefits, and alternatives were provided.  RADIOLOGY Foot X-ray: No acute fracture or stress fracture. Small plantar calcaneal heel spur. (06/22/2023)    MRI showed thickening of approximately 6 to 7 mm of the plantar fascia no tearing mild bony edema   Assessment:   1. Plantar fasciitis      Plan:  Patient was evaluated and treated and all questions answered.  Assessment and Plan Assessment & Plan Plantar fasciitis, right foot We reviewed her MRI results physical  therapy and CAM boot immobilization has been helpful.  She has no tearing.  Starting to improve at this point we will continue her current treatment plan.  She requested no further injection today and we discussed the risks and benefits of this.  Following consent the right heel was injected as noted in procedure note above.  Follow-up with me in 2 months to reevaluate.  Continue boot immobilization for at least 1 month.      No follow-ups on file.

## 2023-11-09 ENCOUNTER — Encounter: Payer: Self-pay | Admitting: Physical Therapy

## 2023-11-09 ENCOUNTER — Ambulatory Visit: Admitting: Physical Therapy

## 2023-11-09 DIAGNOSIS — M6281 Muscle weakness (generalized): Secondary | ICD-10-CM

## 2023-11-09 DIAGNOSIS — M79671 Pain in right foot: Secondary | ICD-10-CM | POA: Diagnosis not present

## 2023-11-09 DIAGNOSIS — M25571 Pain in right ankle and joints of right foot: Secondary | ICD-10-CM

## 2023-11-09 NOTE — Therapy (Signed)
 OUTPATIENT PHYSICAL THERAPY LOWER EXTREMITY TREATMENT   Patient Name: Gail Bass MRN: [id number] DOB:January 21, 1976, 48 y.o., female Today's Date: 11/09/2023  END OF SESSION:  PT End of Session - 11/09/23 1500     Visit Number 6    Number of Visits --    Date for PT Re-Evaluation 12/21/23    Authorization Type [employer] MCD    PT Start Time 1500    PT Stop Time 1542    PT Time Calculation (min) 42 min    Activity Tolerance Patient tolerated treatment well;Patient limited by pain    Behavior During Therapy [facility] for tasks assessed/performed           Past Medical History:  Diagnosis Date   Abscess of buttock 08/13/2019   Asthma    Bacterial vaginosis    Boil 06/14/2019   BV (bacterial vaginosis) 09/13/2008        Hypertension 05/11/2013   Kidney stone    OVARIAN CYST, LEFT 10/29/2008   Qualifier: Diagnosis of  By: Lucille  MD, Taineisha     Pain in the chest 06/06/2015   Painful lumpy right breast 03/21/2013   Recurrent cold sores 12/05/2015   Recurrent sinusitis 06/06/2015   Sinus congestion 04/25/2018   TUBAL PREGNANCY 09/13/2008   Qualifier: History of  By: Lucille  MD, Taineisha     Vaginal discharge 01/10/2014   Vaginal discharge 01/10/2014   Past Surgical History:  Procedure Laterality Date   CYSTECTOMY     CYSTOSCOPY WITH RETROGRADE PYELOGRAM, URETEROSCOPY AND STENT PLACEMENT Left 02/22/2014   Procedure: CYSTOSCOPY WITH RETROGRADE PYELOGRAM, URETEROSCOPY, STONE BASKET EXTRACTION ;  Surgeon: Arlena LILLETTE Gal, MD;  Location: [facility];  Service: Urology;  Laterality: Left;   tailbone cyst     TUBAL LIGATION     Patient Active Problem List   Diagnosis Date Noted   Chronic pelvic pain in female 01/13/2023   Abnormal uterine bleeding (AUB) 12/30/2022   History of ectopic pregnancy 12/30/2022   Screening for cervical cancer 09/24/2022   Screening for colon cancer 09/24/2022   Screening examination for STD (sexually transmitted disease) 09/24/2022   Hot flashes 05/10/2022    Milia of right eyelid 08/27/2021   Viral illness 02/27/2021   Back pain 02/13/2021   Overactive bladder 07/03/2020   Dyspareunia, female 07/03/2020   Mixed stress and urge urinary incontinence 04/11/2020   History of COVID-19 03/13/2020   Seasonal allergies 06/14/2019   PTSD (post-traumatic stress disorder) 12/05/2015   External hemorrhoids 08/07/2015   Vaginal discharge 01/10/2014   Hypertension 05/11/2013   Tobacco use disorder 03/21/2013   Asthma 12/23/2009   Migraine headache 09/13/2008   Depression 06/21/2008    PCP: Janna Ferrier, DO   REFERRING PROVIDER: Silva Juliene SAUNDERS, DPM   REFERRING DIAG:  M72.2 (ICD-10-CM) - Plantar fasciitis  M76.61 (ICD-10-CM) - Achilles tendinitis, right leg  Evaluate and treat for 1-2 sessions / week for 4-6 weeks or at therapist's discretion. Patient has chronic plantar fasciitis, would like to include ROM, strengthening, stability, and manual therapy, dry needling if indicated. Modalities PRN at therapist's discretion.   THERAPY DIAG:  Pain of right heel - Plan: PT plan of care cert/re-cert  Pain in right ankle and joints of right foot - Plan: PT plan of care cert/re-cert  Muscle weakness (generalized) - Plan: PT plan of care cert/re-cert  Rationale for Evaluation and Treatment: Rehabilitation  ONSET DATE: October 2024  SUBJECTIVE:   SUBJECTIVE STATEMENT:  11/09/2023:  Pt reports that she has been  doing well after a long weekend wearing the boot and relative rest.  Reports that she is no longer having severe pain when first walking.  Has been HEP compliant.     EVAL-I work in [employer] at [employer].  I started having pain in my foot October 2024. 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 I started walking not normally and using the outside of my foot. I used to have a high arch and used to wear high heels but I never do now.  I had a cortisone  injection 2 weeks ago. After shot I can stand for 3 hours but before I felt pain right when I get up in the morning and I put my feet on the floor. I walk about 6000 steps a day when I go back to work.  Right now I am doing about 3000. I grew up in foster care and I am used to taking care of myself  PERTINENT HISTORY: Asthma, HTN  PAIN:  Are you having pain? Yes: NPRS scale: at rest ache 1/10 and at worst 10/10 Pain location: R heel and  lateral edge of foot over 5th metatarsal and at insertion of Plantar fascia, also over insertional achilles tendon. Pain description: achy, stabbing Aggravating factors: ,standing too long more than 15 minutes.transition from sitting too long,can't exercise, stairs Relieving factors: cortisone shot  PRECAUTIONS:  : Flagyl  [Metronidazole ]  Not Specified: Doxycycline       RED FLAGS: None   WEIGHT BEARING RESTRICTIONS: No  FALLS:  Has patient fallen in last 6 months? No  LIVING ENVIRONMENT: Lives with: lives with their family Lives in: House/apartment Stairs: definite use of hands with stairs  pt tries to avoid takes one step at a time Has following equipment at home: None  OCCUPATION: Engineer, petroleum at [employer]  PLOF: Independent  PATIENT GOALS: improve my pain and ability to stand for my work at [employer] at [employer] MD VISIT: tbd  OBJECTIVE:  Note: Objective measures were completed at Evaluation unless otherwise noted.  DIAGNOSTIC FINDINGS: RADIOLOGY Foot X-ray: No acute fracture or stress fracture. Small plantar calcaneal heel spur. (06/22/2023)  PATIENT SURVEYS:  LEFS  Extreme difficulty/unable (0), Quite a bit of difficulty (1), Moderate difficulty (2), Little difficulty (3), No difficulty (4) Survey date:  09-16-23  Any of your usual work, housework or school activities      3  2. Usual hobbies, recreational or sporting activities      3  3. Getting into/out of the bath       2  4. Walking between rooms        3  5. Putting on socks/shoes        4  6. Squatting         2  7. Lifting an object, like a bag of groceries from the floor        2  8. Performing light activities around your home          3  9. Performing heavy activities around your home        2  10. Getting into/out of a car         2  11. Walking 2 blocks         3  12. Walking 1 mile         2  13. Going up/down 10 stairs (1 flight)          2  14. Standing for 1 hour         2  15.  sitting for 1 hour        3  16. Running on even ground        0  17. Running on uneven ground         0  18. Making sharp turns while running fast         1  19. Hopping         1  20. Rolling over in bed       4  Score total:  38/80     COGNITION: Overall cognitive status: Within functional limits for tasks assessed     SENSATION: WFL  EDEMA:  Circumferential: R and L both 46 cm   MUSCLE LENGTH: Hamstrings: bil tightness R > L   POSTURE: rounded shoulders, forward head, weight shift left, and Pt with increased pronation of right foot  PALPATION: TTP over heel and insertional achilles and plantar fascia insertion, some tenderness over lateral foot  LOWER EXTREMITY ROM:  Active ROM Right eval Left eval  Hip flexion    Hip extension    Hip abduction    Hip adduction    Hip internal rotation    Hip external rotation    Knee flexion    Knee extension 128 126  Ankle dorsiflexion 8 14  Ankle plantarflexion 39 45  Ankle inversion 34 42  Ankle eversion 15 27   (Blank rows = not tested)  LOWER EXTREMITY MMT:  MMT Right eval Left eval  Hip flexion 4+ 4+  Hip extension 4+ 4+  Hip abduction 4 4  Hip adduction    Hip internal rotation    Hip external rotation    Knee flexion 4+ 4+  Knee extension 4 4+  Ankle dorsiflexion    Ankle plantarflexion 7/25 25/25  Ankle inversion 4- 4+  Ankle eversion     (Blank rows = not tested)  LOWER EXTREMITY SPECIAL TESTS:  NT  FUNCTIONAL TESTS:  5 times sit to stand:  14.59 6 minute walk test: 922 .4 ft SL right 7 sec and Left   30 sec   GAIT: Distance walked: 922.4 ft ( Norm for age  [phone]) Assistive device utilized: None Level of assistance: Complete Independence Comments: Pt with decreased stride length and antalgic gait right and collapses medial arch in gait.                                                                                                                                TREATMENT DATE: [facility] Adult PT Treatment:  DATE: 11/09/2023  Manual Therapy: STM and IASTM of PF  Heel pad taping with leuko tape  Therapeutic Activity  Heel raise isometrics with towel under toes Goal assessment    HOME EXERCISE PROGRAM: Access Code: [email or web address] updated URL: [email or web address] Date: 09/28/2023 Prepared by: Graydon Dingwall  Program Notes Toe Yoga  Exercises - Seated Arch Lifts  - 1 x daily - 7 x weekly - 3 sets - 10 reps - Seated Plantar Fascia Stretch  - 1 x daily - 7 x weekly - 3 sets - 10 reps - Standing heel raise towel under Big toe  - 1 x daily - 7 x weekly - 3 sets - 10 reps - Seated Figure 4 Ankle Inversion with Resistance  - 1 x daily - 7 x weekly - 3 sets - 10 reps - single limb stance when brushing teeth 60 sec each leg  - 1 x daily - 7 x weekly - 1 sets - 2 min hold - seated heel raise with weights  - 1 x daily - 7 x weekly - 3 sets - 10 reps - Clamshell with Resistance  - 1-15 x daily - 7 x weekly - 2 sets - 10 reps - Alternating Single Leg Bridge  - 1 x daily - 7 x weekly - 3 sets - 10 reps  ASSESSMENT:  CLINICAL IMPRESSION: 11/09/2023:  Upon goal recheck pt has made good progress with all short and long term goals.  She is having lower pain, has returned to work, and has increased her walking speed and distance significantly.  She is now having minimal pain in the morning.  She continues to have pain at the end of the day and unfortunately is unable  to wear her boot at work.  Extending POC but reducing frequency to 1x/week to check in over the last month and see if progressive loading is beneficial.   EVAL- Patient is a 48 y.o. female who was seen today for physical therapy evaluation and treatment for Right plantar fasciitis and achilles tendonitis. 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  Pt has chronic condition and would benefit from skilled PT to improve impairments and access knowledge for pain management and strengthening and improving for more efficient gait.  OBJECTIVE IMPAIRMENTS: decreased activity tolerance, decreased knowledge of condition, decreased mobility, difficulty walking, decreased ROM, decreased strength, increased fascial restrictions, improper body mechanics, and pain.   ACTIVITY LIMITATIONS: bending, standing, squatting, stairs, transfers, locomotion level, and caring for others  PARTICIPATION LIMITATIONS: meal prep, cleaning, laundry, driving, and occupation  PERSONAL FACTORS: Time since onset of injury/illness/exacerbation are also affecting patient's functional outcome.   REHAB POTENTIAL: Good  CLINICAL DECISION MAKING: Stable/uncomplicated  EVALUATION COMPLEXITY: Low   GOALS: Goals reviewed with patient? Yes  SHORT TERM GOALS: Target date: 10-14-23 Pt will be independent with initial HEP Baseline:no knowledge Goal status: ONGOING  2.  Pt pain will reduce from 10/10 to 6/10 functional activities Baseline: 10/10 with functional activities 09-28-23 5/10 dull ache Goal status:ONGOING  3.  Pt will be able to perform standing SL heel raise on RT 15/25 x Baseline: 7 x on eval with 10/10 pain Goal status: ONGOING  4.  Pt will improve DF to at least 12 degrees to show improvement in flexibilty Baseline: 8 degrees at eval Goal status: ONGOING    LONG TERM GOALS: Target date: 11-11-23 extended to 12/21/2023   Pt will be independent with  advanced HEP  Baseline: no knowledge 9/9: ongoing Goal status: Ongoing  2.  Pt will be able to tolerate 6 min walk test within normal limits (at least 1635 ft to 1980 ft) Baseline: 922.4 ft 9/9: 1086 Goal status: Ongoing  3.  Pt will be able to negotiate steps without exacerbation of pain greater than 3/10  Baseline: Pt avoids steps due to pain 9/9: 4/10 Goal status: Ongoing  4.  LEFS score will improve by at least 12 ponts MCID to  50 /80 to show functional improvement Baseline: 38/80 9/9: 59/80 Goal status: MET  5.  Increase R ankle gross strength to >/= 4+/5 with </=3/10 max pain during testing to demo improvement in ankle strength/ stability.  Baseline: See AROM chart Goal status: INITIAL  6.  Pt will be more aware of her gait and bearing weight through her 1st ray/great toe ball of  right foot Baseline: Pt tends to collapse medial arch in gait 9/9: MET Goal status: MET  7. Pt will be able to stand for greater than 4 hours in order to return to work  Baseline:  Pain with 15 minutes of standing  9/9: MET - w/ pain at end of day  Goal status: Ongoing   PLAN:  PT FREQUENCY: 1-2x/week  PT DURATION: 8 weeks  PLANNED INTERVENTIONS: 97164- PT Re-evaluation, 97750- Physical Performance Testing, 97110-Therapeutic exercises, 97530- Therapeutic activity, 97112- Neuromuscular re-education, 97535- Self Care, [id number]- Manual therapy, [id number]- Gait training, [phone]- Electrical stimulation (manual), [phone] (1-2 muscles), 20561 (3+ muscles)- Dry Needling, Patient/Family education, Balance training, Stair training, Taping, Joint mobilization, Spinal mobilization, Cryotherapy, and Moist heat  PLAN FOR NEXT SESSION: Manual  TPDN, progress HEP  possible fat pad taping of heel  Helene FORBES Gasmen PT 11/09/23 4:34 PM Phone: [phone] Fax: [phone]

## 2023-11-25 ENCOUNTER — Other Ambulatory Visit: Payer: Self-pay | Admitting: Family Medicine

## 2023-11-25 ENCOUNTER — Ambulatory Visit: Admitting: Physical Therapy

## 2023-11-25 DIAGNOSIS — B001 Herpesviral vesicular dermatitis: Secondary | ICD-10-CM

## 2023-12-02 ENCOUNTER — Ambulatory Visit: Attending: Podiatry | Admitting: Physical Therapy

## 2023-12-02 ENCOUNTER — Encounter: Payer: Self-pay | Admitting: Physical Therapy

## 2023-12-02 DIAGNOSIS — M6281 Muscle weakness (generalized): Secondary | ICD-10-CM | POA: Diagnosis present

## 2023-12-02 DIAGNOSIS — M79671 Pain in right foot: Secondary | ICD-10-CM | POA: Insufficient documentation

## 2023-12-02 DIAGNOSIS — M25571 Pain in right ankle and joints of right foot: Secondary | ICD-10-CM | POA: Diagnosis present

## 2023-12-02 NOTE — Therapy (Unsigned)
 OUTPATIENT PHYSICAL THERAPY LOWER EXTREMITY TREATMENT   Patient Name: Gail Bass MRN: [id number] DOB:1975-03-15, 48 y.o., female Today's Date: 12/03/2023  END OF SESSION:  PT End of Session - 12/02/23 1543     Visit Number 7    Date for Recertification  12/21/23    Authorization Type [employer] MCD    PT Start Time 1545    PT Stop Time 1626    PT Time Calculation (min) 41 min    Activity Tolerance Patient tolerated treatment well;Patient limited by pain    Behavior During Therapy [facility] for tasks assessed/performed           Past Medical History:  Diagnosis Date   Abscess of buttock 08/13/2019   Asthma    Bacterial vaginosis    Boil 06/14/2019   BV (bacterial vaginosis) 09/13/2008        Hypertension 05/11/2013   Kidney stone    OVARIAN CYST, LEFT 10/29/2008   Qualifier: Diagnosis of  By: Lucille  MD, Taineisha     Pain in the chest 06/06/2015   Painful lumpy right breast 03/21/2013   Recurrent cold sores 12/05/2015   Recurrent sinusitis 06/06/2015   Sinus congestion 04/25/2018   TUBAL PREGNANCY 09/13/2008   Qualifier: History of  By: Lucille  MD, Taineisha     Vaginal discharge 01/10/2014   Vaginal discharge 01/10/2014   Past Surgical History:  Procedure Laterality Date   CYSTECTOMY     CYSTOSCOPY WITH RETROGRADE PYELOGRAM, URETEROSCOPY AND STENT PLACEMENT Left 02/22/2014   Procedure: CYSTOSCOPY WITH RETROGRADE PYELOGRAM, URETEROSCOPY, STONE BASKET EXTRACTION ;  Surgeon: Arlena LILLETTE Gal, MD;  Location: [facility];  Service: Urology;  Laterality: Left;   tailbone cyst     TUBAL LIGATION     Patient Active Problem List   Diagnosis Date Noted   Chronic pelvic pain in female 01/13/2023   Abnormal uterine bleeding (AUB) 12/30/2022   History of ectopic pregnancy 12/30/2022   Screening for cervical cancer 09/24/2022   Screening for colon cancer 09/24/2022   Screening examination for STD (sexually transmitted disease) 09/24/2022   Hot flashes 05/10/2022   Milia of right eyelid  08/27/2021   Viral illness 02/27/2021   Back pain 02/13/2021   Overactive bladder 07/03/2020   Dyspareunia, female 07/03/2020   Mixed stress and urge urinary incontinence 04/11/2020   History of COVID-19 03/13/2020   Seasonal allergies 06/14/2019   PTSD (post-traumatic stress disorder) 12/05/2015   External hemorrhoids 08/07/2015   Vaginal discharge 01/10/2014   Hypertension 05/11/2013   Tobacco use disorder 03/21/2013   Asthma 12/23/2009   Migraine headache 09/13/2008   Depression 06/21/2008    PCP: Janna Ferrier, DO   REFERRING PROVIDER: Silva Juliene SAUNDERS, DPM   REFERRING DIAG:  M72.2 (ICD-10-CM) - Plantar fasciitis  M76.61 (ICD-10-CM) - Achilles tendinitis, right leg  Evaluate and treat for 1-2 sessions / week for 4-6 weeks or at therapist's discretion. Patient has chronic plantar fasciitis, would like to include ROM, strengthening, stability, and manual therapy, dry needling if indicated. Modalities PRN at therapist's discretion.   THERAPY DIAG:  Pain of right heel  Pain in right ankle and joints of right foot  Muscle weakness (generalized)  Rationale for Evaluation and Treatment: Rehabilitation  ONSET DATE: October 2024  SUBJECTIVE:   SUBJECTIVE STATEMENT:  12/03/2023:  Pt reports that her foot is doing well.  She has had minimal pain.    EVAL-I work in [employer] at [employer].  I started having pain in my foot October  2024. 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 I started walking not normally and using the outside of my foot. I used to have a high arch and used to wear high heels but I never do now.  I had a cortisone injection 2 weeks ago. After shot I can stand for 3 hours but before I felt pain right when I get up in the morning and I put my feet on the floor. I walk about 6000 steps a day when I go back to work.  Right now I am doing about 3000. I grew up in foster care  and I am used to taking care of myself  PERTINENT HISTORY: Asthma, HTN  PAIN:  Are you having pain? Yes: NPRS scale: at rest ache 1/10 and at worst 10/10 Pain location: R heel and  lateral edge of foot over 5th metatarsal and at insertion of Plantar fascia, also over insertional achilles tendon. Pain description: achy, stabbing Aggravating factors: ,standing too long more than 15 minutes.transition from sitting too long,can't exercise, stairs Relieving factors: cortisone shot  PRECAUTIONS:  : Flagyl  [Metronidazole ]  Not Specified: Doxycycline       RED FLAGS: None   WEIGHT BEARING RESTRICTIONS: No  FALLS:  Has patient fallen in last 6 months? No  LIVING ENVIRONMENT: Lives with: lives with their family Lives in: House/apartment Stairs: definite use of hands with stairs  pt tries to avoid takes one step at a time Has following equipment at home: None  OCCUPATION: Engineer, petroleum at [employer]  PLOF: Independent  PATIENT GOALS: improve my pain and ability to stand for my work at [employer] at [employer] MD VISIT: tbd  OBJECTIVE:  Note: Objective measures were completed at Evaluation unless otherwise noted.  DIAGNOSTIC FINDINGS: RADIOLOGY Foot X-ray: No acute fracture or stress fracture. Small plantar calcaneal heel spur. (06/22/2023)  PATIENT SURVEYS:  LEFS  Extreme difficulty/unable (0), Quite a bit of difficulty (1), Moderate difficulty (2), Little difficulty (3), No difficulty (4) Survey date:  09-16-23  Any of your usual work, housework or school activities      3  2. Usual hobbies, recreational or sporting activities      3  3. Getting into/out of the bath       2  4. Walking between rooms       3  5. Putting on socks/shoes        4  6. Squatting         2  7. Lifting an object, like a bag of groceries from the floor        2  8. Performing light activities around your home          3  9. Performing heavy activities around your home        2   10. Getting into/out of a car         2  11. Walking 2 blocks         3  12. Walking 1 mile         2  13. Going up/down 10 stairs (1 flight)          2  14. Standing for 1 hour         2  15.  sitting for 1 hour        3  16. Running  on even ground        0  17. Running on uneven ground         0  18. Making sharp turns while running fast         1  19. Hopping         1  20. Rolling over in bed       4  Score total:  38/80     COGNITION: Overall cognitive status: Within functional limits for tasks assessed     SENSATION: WFL  EDEMA:  Circumferential: R and L both 46 cm   MUSCLE LENGTH: Hamstrings: bil tightness R > L   POSTURE: rounded shoulders, forward head, weight shift left, and Pt with increased pronation of right foot  PALPATION: TTP over heel and insertional achilles and plantar fascia insertion, some tenderness over lateral foot  LOWER EXTREMITY ROM:  Active ROM Right eval Left eval  Hip flexion    Hip extension    Hip abduction    Hip adduction    Hip internal rotation    Hip external rotation    Knee flexion    Knee extension 128 126  Ankle dorsiflexion 8 14  Ankle plantarflexion 39 45  Ankle inversion 34 42  Ankle eversion 15 27   (Blank rows = not tested)  LOWER EXTREMITY MMT:  MMT Right eval Left eval  Hip flexion 4+ 4+  Hip extension 4+ 4+  Hip abduction 4 4  Hip adduction    Hip internal rotation    Hip external rotation    Knee flexion 4+ 4+  Knee extension 4 4+  Ankle dorsiflexion    Ankle plantarflexion 7/25 25/25  Ankle inversion 4- 4+  Ankle eversion     (Blank rows = not tested)  LOWER EXTREMITY SPECIAL TESTS:  NT  FUNCTIONAL TESTS:  5 times sit to stand: 14.59 6 minute walk test: 922 .4 ft SL right 7 sec and Left   30 sec   GAIT: Distance walked: 922.4 ft ( Norm for age  [phone]) Assistive device utilized: None Level of assistance: Complete Independence Comments: Pt with decreased stride length and  antalgic gait right and collapses medial arch in gait.                                                                                                                                TREATMENT DATE: [facility] Adult PT Treatment:                                                DATE: 12/03/2023   Therapeutic Activity  Heel raise SL isometric - 10'' holds 10x SLS SL RDL  Education: exercise and load progression  Therex:  Bil bridge Staggered SL bridge S/L hip abd   HOME  EXERCISE PROGRAM: Access Code: [email or web address] URL: [email or web address] Date: 12/02/2023 Prepared by: Helene Gasmen  Program Notes Toe Yoga  Exercises - Seated Arch Lifts  - 1 x daily - 7 x weekly - 3 sets - 10 reps - Seated Plantar Fascia Stretch  - 1 x daily - 7 x weekly - 3 sets - 10 reps - Seated Figure 4 Ankle Inversion with Resistance  - 1 x daily - 7 x weekly - 3 sets - 10 reps - single limb stance when brushing teeth 60 sec each leg  - 1 x daily - 7 x weekly - 1 sets - 2 min hold - Alternating Single Leg Bridge  - 1 x daily - 7 x weekly - 3 sets - 10 reps - Single Leg Heel Raise  - 1 x daily - 7 x weekly - 5 reps - 10-20'' hold - Sidelying Hip Abduction  - 1 x daily - 7 x weekly - 3 sets - 10 reps  ASSESSMENT:  CLINICAL IMPRESSION: 12/03/2023:  Avelina tolerated session well with no adverse reaction.  Having much lower pain levels with combination of injection, exercise, and new orthotics.  Plan on 1 more follow up and D/C assuming no increase in sxs.  Updated HEP.   EVAL- Patient is a 48 y.o. female who was seen today for physical therapy evaluation and treatment for Right plantar fasciitis and achilles tendonitis. 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  Pt has chronic condition and would benefit from skilled PT to improve impairments and access knowledge for pain management and strengthening and improving for more efficient  gait.  OBJECTIVE IMPAIRMENTS: decreased activity tolerance, decreased knowledge of condition, decreased mobility, difficulty walking, decreased ROM, decreased strength, increased fascial restrictions, improper body mechanics, and pain.   ACTIVITY LIMITATIONS: bending, standing, squatting, stairs, transfers, locomotion level, and caring for others  PARTICIPATION LIMITATIONS: meal prep, cleaning, laundry, driving, and occupation  PERSONAL FACTORS: Time since onset of injury/illness/exacerbation are also affecting patient's functional outcome.   REHAB POTENTIAL: Good  CLINICAL DECISION MAKING: Stable/uncomplicated  EVALUATION COMPLEXITY: Low   GOALS: Goals reviewed with patient? Yes  SHORT TERM GOALS: Target date: 10-14-23 Pt will be independent with initial HEP Baseline:no knowledge Goal status: ONGOING  2.  Pt pain will reduce from 10/10 to 6/10 functional activities Baseline: 10/10 with functional activities 09-28-23 5/10 dull ache Goal status:ONGOING  3.  Pt will be able to perform standing SL heel raise on RT 15/25 x Baseline: 7 x on eval with 10/10 pain Goal status: ONGOING  4.  Pt will improve DF to at least 12 degrees to show improvement in flexibilty Baseline: 8 degrees at eval Goal status: ONGOING    LONG TERM GOALS: Target date: 11-11-23 extended to 12/21/2023   Pt will be independent with advanced HEP  Baseline: no knowledge 9/9: ongoing Goal status: Ongoing  2.  Pt will be able to tolerate 6 min walk test within normal limits (at least 1635 ft to 1980 ft) Baseline: 922.4 ft 9/9: 1086 Goal status: Ongoing  3.  Pt will be able to negotiate steps without exacerbation of pain greater than 3/10  Baseline: Pt avoids steps due to pain 9/9: 4/10 Goal status: Ongoing  4.  LEFS score will improve by at least 12 ponts MCID to  50 /80 to show functional improvement Baseline: 38/80 9/9: 59/80 Goal status: MET  5.  Increase R ankle gross  strength to >/=  4+/5 with </=3/10 max pain during testing to demo improvement in ankle strength/ stability.  Baseline: See AROM chart Goal status: INITIAL  6.  Pt will be more aware of her gait and bearing weight through her 1st ray/great toe ball of  right foot Baseline: Pt tends to collapse medial arch in gait 9/9: MET Goal status: MET  7. Pt will be able to stand for greater than 4 hours in order to return to work  Baseline:  Pain with 15 minutes of standing  9/9: MET - w/ pain at end of day  Goal status: Ongoing   PLAN:  PT FREQUENCY: 1-2x/week  PT DURATION: 8 weeks  PLANNED INTERVENTIONS: 97164- PT Re-evaluation, 97750- Physical Performance Testing, 97110-Therapeutic exercises, 97530- Therapeutic activity, 97112- Neuromuscular re-education, 97535- Self Care, 02859- Manual therapy, [id number]- Gait training, [phone]- Electrical stimulation (manual), [phone] (1-2 muscles), 20561 (3+ muscles)- Dry Needling, Patient/Family education, Balance training, Stair training, Taping, Joint mobilization, Spinal mobilization, Cryotherapy, and Moist heat  PLAN FOR NEXT SESSION: Manual  TPDN, progress HEP  possible fat pad taping of heel  Helene BRAVO Jessye Imhoff PT 12/03/23 10:11 AM Phone: [phone] Fax: [phone]

## 2023-12-06 ENCOUNTER — Ambulatory Visit: Admitting: Physical Therapy

## 2023-12-06 ENCOUNTER — Telehealth: Payer: Self-pay | Admitting: Physical Therapy

## 2023-12-06 NOTE — Telephone Encounter (Signed)
 Called and informed patient of missed visit and provided reminder of next appt via VM.

## 2023-12-13 ENCOUNTER — Other Ambulatory Visit: Payer: Self-pay | Admitting: *Deleted

## 2023-12-13 DIAGNOSIS — N939 Abnormal uterine and vaginal bleeding, unspecified: Secondary | ICD-10-CM

## 2023-12-13 DIAGNOSIS — G8929 Other chronic pain: Secondary | ICD-10-CM

## 2023-12-13 MED ORDER — NORETHINDRONE 0.35 MG PO TABS: 1.0000 | ORAL_TABLET | Freq: Every day | ORAL | 1 refills | Status: DC

## 2023-12-13 NOTE — Telephone Encounter (Signed)
 Patient left message on triage line f/u on refill request for norethindrone , has 1 week left of medication.   Last AEX: None on file; OV 04/15/23 Next AEX: None on file MMG: 10/14/22, patient aware needs update.   AEX scheduled for 01/19/24 at 3:00 PM  Rx pended norethindrone  0.35 mg #28/1RF

## 2023-12-17 ENCOUNTER — Other Ambulatory Visit: Payer: Self-pay | Admitting: Obstetrics and Gynecology

## 2023-12-17 DIAGNOSIS — N939 Abnormal uterine and vaginal bleeding, unspecified: Secondary | ICD-10-CM

## 2023-12-17 DIAGNOSIS — G8929 Other chronic pain: Secondary | ICD-10-CM

## 2023-12-28 ENCOUNTER — Ambulatory Visit: Admitting: Physical Therapy

## 2024-01-03 ENCOUNTER — Telehealth: Payer: Self-pay | Admitting: Gastroenterology

## 2024-01-03 NOTE — Telephone Encounter (Signed)
 Patient requesting f/u call to speak with a nurse in regards to plan of care. Please advise.

## 2024-01-04 ENCOUNTER — Ambulatory Visit (INDEPENDENT_AMBULATORY_CARE_PROVIDER_SITE_OTHER): Admitting: Podiatry

## 2024-01-04 VITALS — Ht 63.0 in | Wt 167.0 lb

## 2024-01-04 DIAGNOSIS — M62461 Contracture of muscle, right lower leg: Secondary | ICD-10-CM | POA: Diagnosis not present

## 2024-01-04 DIAGNOSIS — M722 Plantar fascial fibromatosis: Secondary | ICD-10-CM | POA: Diagnosis not present

## 2024-01-04 NOTE — Telephone Encounter (Signed)
 Patient on Z-pack in preparation for a root canal which will take place on 01/12/24. Not feverish. She will complete antibiotics on 01/08/24. Procedure left on for 01/11/24.

## 2024-01-04 NOTE — Telephone Encounter (Signed)
 Inbound call from patient stating she went to dentist and they prescribed her antibiotics for an infected tooth but she has not started taking them due to upcoming procedure on 01/11/24. Patient requesting a call back to be advised on what to do since she needs to take antibiotic for the infection. Please advise  Thank you

## 2024-01-04 NOTE — Telephone Encounter (Signed)
 Clifford thanks

## 2024-01-06 NOTE — Progress Notes (Signed)
  Subjective:  Patient ID: Gail Bass, female    DOB: 1976-02-11,  MRN: [id number]  Chief Complaint  Patient presents with   Plantar Fasciitis    Rm 1 Patient is here to f/u on plantar fasciitisof the right foot. Pt is interested in a night splint. Pt states great improvement in pain. Pt is currently using custom orthotics and new shoes.    Discussed the use of AI scribe software for clinical note transcription with the patient, who gave verbal consent to proceed.  History of Present Illness She is been significant improvement again.  He feels tight in the calves but no pain      Objective:    Physical Exam VASCULAR: DP and PT pulse palpable. Foot is warm and well-perfused. Capillary fill time is brisk. DERMATOLOGIC: Normal skin turgor, texture, and temperature. No open lesions, rashes, or ulcerations. NEUROLOGIC: Normal sensation to light touch and pressure. No paresthesias. ORTHOPEDIC: No pain to palpation of plantar fascia today she has gastrocnemius equinus   No images are attached to the encounter.    Results Procedure: Corticosteroid injection Description: The right heel was prepped with alcohol and ethyl chloride spray was used as a topical anesthetic. The insertion of the medial band of the plantar fascia was injected with 20 mg of Kenalog , 4 mg of dexamethasone , and 5 mg of Marcaine. The patient tolerated the procedure well. Informed Consent: Consent and a discussion of the risks, benefits, and alternatives were provided.  RADIOLOGY Foot X-ray: No acute fracture or stress fracture. Small plantar calcaneal heel spur. (06/22/2023)    MRI showed thickening of approximately 6 to 7 mm of the plantar fascia no tearing mild bony edema   Assessment:   No diagnosis found.    Plan:  Patient was evaluated and treated and all questions answered.  Assessment and Plan Assessment & Plan Plantar fasciitis, right foot Doing very well and is nearly fully resolved.  No  further injections at this time.  She did have a moderate amount of skin atrophy which should resolve with time.  I discussed with her that reduction of the equinus contracture this may offer her a medical consultation as well.  Night splint dispensed.  Follow-up with me in 3 months to reevaluate.      Return in about 3 months (around 04/05/2024) for recheck plantar fasciitis.

## 2024-01-11 ENCOUNTER — Ambulatory Visit: Admitting: Gastroenterology

## 2024-01-11 ENCOUNTER — Encounter: Payer: Self-pay | Admitting: Gastroenterology

## 2024-01-11 VITALS — BP 136/81 | HR 72 | Temp 98.3°F | Resp 16 | Ht 63.0 in | Wt 167.0 lb

## 2024-01-11 DIAGNOSIS — K5730 Diverticulosis of large intestine without perforation or abscess without bleeding: Secondary | ICD-10-CM | POA: Diagnosis not present

## 2024-01-11 DIAGNOSIS — K449 Diaphragmatic hernia without obstruction or gangrene: Secondary | ICD-10-CM | POA: Diagnosis not present

## 2024-01-11 DIAGNOSIS — D123 Benign neoplasm of transverse colon: Secondary | ICD-10-CM

## 2024-01-11 DIAGNOSIS — K625 Hemorrhage of anus and rectum: Secondary | ICD-10-CM | POA: Diagnosis not present

## 2024-01-11 DIAGNOSIS — I10 Essential (primary) hypertension: Secondary | ICD-10-CM | POA: Diagnosis not present

## 2024-01-11 DIAGNOSIS — K6389 Other specified diseases of intestine: Secondary | ICD-10-CM | POA: Diagnosis not present

## 2024-01-11 DIAGNOSIS — K644 Residual hemorrhoidal skin tags: Secondary | ICD-10-CM

## 2024-01-11 DIAGNOSIS — K635 Polyp of colon: Secondary | ICD-10-CM | POA: Diagnosis not present

## 2024-01-11 DIAGNOSIS — K648 Other hemorrhoids: Secondary | ICD-10-CM

## 2024-01-11 DIAGNOSIS — K2970 Gastritis, unspecified, without bleeding: Secondary | ICD-10-CM

## 2024-01-11 DIAGNOSIS — J45909 Unspecified asthma, uncomplicated: Secondary | ICD-10-CM | POA: Diagnosis not present

## 2024-01-11 DIAGNOSIS — K2950 Unspecified chronic gastritis without bleeding: Secondary | ICD-10-CM | POA: Diagnosis not present

## 2024-01-11 DIAGNOSIS — K219 Gastro-esophageal reflux disease without esophagitis: Secondary | ICD-10-CM

## 2024-01-11 MED ORDER — SODIUM CHLORIDE 0.9 % IV SOLN: 500.0000 mL | Freq: Once | INTRAVENOUS | Status: DC

## 2024-01-11 NOTE — Progress Notes (Signed)
 Called to room to assist during endoscopic procedure.  Patient ID and intended procedure confirmed with present staff. Received instructions for my participation in the procedure from the performing physician.

## 2024-01-11 NOTE — Progress Notes (Signed)
 [facility] History and Physical   Primary Care Physician:  Janna Ferrier, DO   Reason for Procedure:  GERD, early satiety, abdominal bloating, rectal bleeding  Plan:    EGD and colonoscopy with possible interventions as needed     HPI: Gail Bass is a very pleasant 48 y.o. female here for EGD and colonoscopy for GERD, early satiety, abdominal bloating, rectal bleeding.   The risks and benefits as well as alternatives of endoscopic procedure(s) have been discussed and reviewed.  The patient was provided an opportunity to ask questions and all were answered. The patient agreed with the plan and demonstrated an understanding of the instructions.   Past Medical History:  Diagnosis Date   Abscess of buttock 08/13/2019   Asthma    Bacterial vaginosis    Boil 06/14/2019   BV (bacterial vaginosis) 09/13/2008        Hypertension 05/11/2013   Kidney stone    OVARIAN CYST, LEFT 10/29/2008   Qualifier: Diagnosis of  By: Lucille  MD, Taineisha     Pain in the chest 06/06/2015   Painful lumpy right breast 03/21/2013   Recurrent cold sores 12/05/2015   Recurrent sinusitis 06/06/2015   Sinus congestion 04/25/2018   TUBAL PREGNANCY 09/13/2008   Qualifier: History of  By: Lucille  MD, Taineisha     Vaginal discharge 01/10/2014   Vaginal discharge 01/10/2014    Past Surgical History:  Procedure Laterality Date   CYSTECTOMY     CYSTOSCOPY WITH RETROGRADE PYELOGRAM, URETEROSCOPY AND STENT PLACEMENT Left 02/22/2014   Procedure: CYSTOSCOPY WITH RETROGRADE PYELOGRAM, URETEROSCOPY, STONE BASKET EXTRACTION ;  Surgeon: Arlena LILLETTE Gal, MD;  Location: [facility];  Service: Urology;  Laterality: Left;   tailbone cyst     TUBAL LIGATION      Prior to Admission medications   Medication Sig Start Date End Date Taking? Authorizing Provider  acyclovir  (ZOVIRAX ) 400 MG tablet TAKE 1 TABLET(400 MG) BY MOUTH TWICE DAILY 11/25/23  Yes Janna Ferrier, DO  cetirizine  (ZYRTEC ) 10 MG tablet TAKE  1 TABLET(10 MG) BY MOUTH DAILY 06/21/23  Yes Janna Ferrier, DO  famotidine  (PEPCID ) 40 MG tablet Take 1 tablet (40 mg total) by mouth daily. 10/12/23  Yes McDonald, Juliene SAUNDERS, DPM  Multiple Vitamins-Minerals (ULTRA WOMENS PACK PO) Take by mouth.   Yes [provider]  norethindrone  (ORTHO MICRONOR ) 0.35 MG tablet Take 1 tablet (0.35 mg total) by mouth daily. 12/13/23  Yes Hines, Genesis V, MD  omeprazole  (PRILOSEC) 40 MG capsule Take 1 capsule (40 mg total) by mouth daily. 10/27/23  Yes Arletta Camie BRAVO, PA-C  Probiotic Product (PROBIOTIC PO) Take by mouth.   Yes [provider]  albuterol  (VENTOLIN  HFA) 108 (90 Base) MCG/ACT inhaler Inhale 2 puffs into the lungs every 6 (six) hours as needed. 06/21/23   Gomes, Adriana, DO  fluticasone  (FLONASE ) 50 MCG/ACT nasal spray SHAKE LIQUID AND USE 2 SPRAYS IN EACH NOSTRIL DAILY 04/29/22   Macario Dorothyann HERO, MD  hydrocortisone  (ANUSOL -HC) 2.5 % rectal cream Place 1 Application rectally 2 (two) times daily. 06/21/23   Gomes, Adriana, DO  ibuprofen  (ADVIL ) 600 MG tablet Take 1 tablet (600 mg total) by mouth every 6 (six) hours as needed. 08/13/19   McDonald, Mia A, PA-C  LYSINE  PO Take by mouth.    [provider]  meloxicam  (MOBIC ) 15 MG tablet Take 1 tablet (15 mg total) by mouth daily. 06/22/23   McDonald, Juliene SAUNDERS, DPM  NIFEdipine  (PROCARDIA -XL/NIFEDICAL-XL) 30 MG 24 hr tablet  Take 1 tablet (30 mg total) by mouth daily. 08/31/23   Gomes, Adriana, DO  ondansetron  (ZOFRAN ) 4 MG tablet Take 4 mg by mouth every 8 (eight) hours as needed. 10/04/23   [provider]  SODIUM FLUORIDE  5000 PLUS 1.1 % CREA dental cream Place 1 Application onto teeth at bedtime. 09/07/23   [provider]    Current Outpatient Medications  Medication Sig Dispense Refill   acyclovir  (ZOVIRAX ) 400 MG tablet TAKE 1 TABLET(400 MG) BY MOUTH TWICE DAILY 180 tablet 0   cetirizine  (ZYRTEC ) 10 MG tablet TAKE 1 TABLET(10 MG) BY MOUTH DAILY 90 tablet 1   famotidine   (PEPCID ) 40 MG tablet Take 1 tablet (40 mg total) by mouth daily. 30 tablet 0   Multiple Vitamins-Minerals (ULTRA WOMENS PACK PO) Take by mouth.     norethindrone  (ORTHO MICRONOR ) 0.35 MG tablet Take 1 tablet (0.35 mg total) by mouth daily. 28 tablet 1   omeprazole  (PRILOSEC) 40 MG capsule Take 1 capsule (40 mg total) by mouth daily. 30 capsule 3   Probiotic Product (PROBIOTIC PO) Take by mouth.     albuterol  (VENTOLIN  HFA) 108 (90 Base) MCG/ACT inhaler Inhale 2 puffs into the lungs every 6 (six) hours as needed. 18 g 3   fluticasone  (FLONASE ) 50 MCG/ACT nasal spray SHAKE LIQUID AND USE 2 SPRAYS IN EACH NOSTRIL DAILY 16 g 12   hydrocortisone  (ANUSOL -HC) 2.5 % rectal cream Place 1 Application rectally 2 (two) times daily. 30 g 1   ibuprofen  (ADVIL ) 600 MG tablet Take 1 tablet (600 mg total) by mouth every 6 (six) hours as needed. 30 tablet 0   LYSINE  PO Take by mouth.     meloxicam  (MOBIC ) 15 MG tablet Take 1 tablet (15 mg total) by mouth daily. 30 tablet 3   NIFEdipine  (PROCARDIA -XL/NIFEDICAL-XL) 30 MG 24 hr tablet Take 1 tablet (30 mg total) by mouth daily. 90 tablet 3   ondansetron  (ZOFRAN ) 4 MG tablet Take 4 mg by mouth every 8 (eight) hours as needed.     SODIUM FLUORIDE  5000 PLUS 1.1 % CREA dental cream Place 1 Application onto teeth at bedtime.     Current Facility-Administered Medications  Medication Dose Route Frequency Provider Last Rate Last Admin   0.9 %  sodium chloride  infusion  500 mL Intravenous Once Reylynn Vanalstine V, MD        Allergies as of 01/11/2024 - Review Complete 01/11/2024  Allergen Reaction Noted   Flagyl  [metronidazole ] Other (See Comments) 01/15/2023   Doxycycline  Other (See Comments) 03/29/2009    Family History  Problem Relation Age of Onset   Heart Problems Mother    Heart failure Mother    Arrhythmia Father        Pacemakers placed didn't take passed away 3 moths later   Arrhythmia Sister        Pacemaker didn't take to body passed away 3 months  ;later   Heart failure Maternal Grandmother    Hypertension Maternal Grandfather    Hypertension Maternal Aunt    Cervical cancer Maternal Aunt        stage 4 type B   Endometrial cancer Maternal Aunt    Endometriosis Maternal Aunt    Colon cancer Maternal Aunt    Colon polyps Maternal Aunt    Colon cancer Maternal Uncle    Colon cancer Maternal Uncle     Social History   Socioeconomic History   Marital status: Single    Spouse name: Not on file  Number of children: Not on file   Years of education: Not on file   Highest education level: 10th grade  Occupational History   Not on file  Tobacco Use   Smoking status: Every Day    Current packs/day: 0.50    Average packs/day: 0.5 packs/day for 11.1 years (5.6 ttl pk-yrs)    Types: Cigarettes    Start date: 11/19/2012    Passive exposure: Current   Smokeless tobacco: Never   Tobacco comments:    Reports attempting cessation with patches.   Vaping Use   Vaping status: Never Used  Substance and Sexual Activity   Alcohol use: No   Drug use: Yes    Types: Marijuana    Comment: CBD vape   Sexual activity: Yes    Partners: Male    Birth control/protection: Surgical    Comment: Tubal ligation  Other Topics Concern   Not on file  Social History Narrative   Has 3 children and one adopted daughter.   Works.    [employer].   Mother lives in [place]. They are estranged because her mother practices withcraft.    Social Drivers of Health   Financial Resource Strain: Medium Risk (08/30/2023)   Overall Financial Resource Strain (CARDIA)    Difficulty of Paying Living Expenses: Somewhat hard  Food Insecurity: No Food Insecurity (08/30/2023)   Hunger Vital Sign    Worried About Running Out of Food in the Last Year: Never true    Ran Out of Food in the Last Year: Never true  Transportation Needs: No Transportation Needs (08/30/2023)   PRAPARE - Administrator, Civil Service (Medical): No    Lack of Transportation  (Non-Medical): No  Physical Activity: Unknown (08/30/2023)   Exercise Vital Sign    Days of Exercise per Week: 5 days    Minutes of Exercise per Session: Patient declined  Stress: No Stress Concern Present (08/30/2023)   [employer] of Occupational Health - Occupational Stress Questionnaire    Feeling of Stress: Not at all  Social Connections: Moderately Integrated (08/30/2023)   Social Connection and Isolation Panel    Frequency of Communication with Friends and Family: More than three times a week    Frequency of Social Gatherings with Friends and Family: Twice a week    Attends Religious Services: More than 4 times per year    Active Member of [employer] or Organizations: Yes    Attends Banker Meetings: 1 to 4 times per year    Marital Status: Never married  Intimate Partner Violence: Not on file    Review of Systems:  All other review of systems negative except as mentioned in the HPI.  Physical Exam: Vital signs in last 24 hours: BP (!) 150/102   Pulse 76   Temp 98.3 F (36.8 C)   Ht 5' 3 (1.6 m)   Wt 167 lb (75.8 kg)   SpO2 96%   BMI 29.58 kg/m  General:   Alert, NAD Lungs:  Clear .   Heart:  Regular rate and rhythm Abdomen:  Soft, nontender and nondistended. Neuro/Psych:  Alert and cooperative. Normal mood and affect. A and O x 3  Reviewed labs, radiology imaging, old records and pertinent past GI work up  Patient is appropriate for planned procedure(s) and anesthesia in an ambulatory setting   K. Veena Jarelyn Bambach , MD [phone]

## 2024-01-11 NOTE — Progress Notes (Signed)
 Pt's states no medical or surgical changes since previsit or office visit.

## 2024-01-11 NOTE — Op Note (Signed)
 [facility] Patient Name: Gail Bass Procedure Date: 01/11/2024 1:32 PM MRN: [id number] Endoscopist: Gustav ALONSO Mcgee , MD, [id number] Age: 48 Referring MD:  Date of Birth: [birth year] Gender: Female Account #: [id number] Procedure:                Upper GI endoscopy Indications:              Dyspepsia, Suspected gastro-esophageal reflux                            disease Medicines:                Monitored Anesthesia Care Procedure:                Pre-Anesthesia Assessment:                           - Prior to the procedure, a History and Physical                            was performed, and patient medications and                            allergies were reviewed. The patient's tolerance of                            previous anesthesia was also reviewed. The risks                            and benefits of the procedure and the sedation                            options and risks were discussed with the patient.                            All questions were answered, and informed consent                            was obtained. Prior Anticoagulants: The patient has                            taken no anticoagulant or antiplatelet agents. ASA                            Grade Assessment: II - A patient with mild systemic                            disease. After reviewing the risks and benefits,                            the patient was deemed in satisfactory condition to                            undergo the procedure.  After obtaining informed consent, the endoscope was                            passed under direct vision. Throughout the                            procedure, the patient's blood pressure, pulse, and                            oxygen saturations were monitored continuously. The                            GIF [id number] #[id number] was introduced through the                            mouth, and advanced to the second part of  duodenum.                            The upper GI endoscopy was accomplished without                            difficulty. The patient tolerated the procedure                            well. Scope In: Scope Out: Findings:                 The Z-line was regular and was found 38 cm from the                            incisors.                           A small hiatal hernia was present.                           Patchy mild inflammation characterized by                            congestion (edema), erythema and friability was                            found in the entire examined stomach. Biopsies were                            taken with a cold forceps for Helicobacter pylori                            testing.                           The cardia and gastric fundus were normal on                            retroflexion.  The examined duodenum was normal. Complications:            No immediate complications. Estimated Blood Loss:     Estimated blood loss was minimal. Impression:               - Z-line regular, 38 cm from the incisors.                           - Small hiatal hernia.                           - Gastritis. Biopsied.                           - Normal examined duodenum. Recommendation:           - Resume previous diet.                           - Continue present medications.                           - Await pathology results. Damaree Sargent V. Newell Frater, MD 01/11/2024 2:21:53 PM This report has been signed electronically.

## 2024-01-11 NOTE — Patient Instructions (Addendum)
 Resume previous diet. Continue present medications.  Awaiting pathology results. Repeat colonoscopy in 5-10 years for surveillance based on pathology results.  Handouts provided on hiatal hernia, gastritis polyps, diverticulosis, and hemorrhoids.   YOU HAD AN ENDOSCOPIC PROCEDURE TODAY AT THE [facility]:   Refer to the procedure report that was given to you for any specific questions about what was found during the examination.  If the procedure report does not answer your questions, please call your gastroenterologist to clarify.  If you requested that your care partner not be given the details of your procedure findings, then the procedure report has been included in a sealed envelope for you to review at your convenience later.  YOU SHOULD EXPECT: Some feelings of bloating in the abdomen. Passage of more gas than usual.  Walking can help get rid of the air that was put into your GI tract during the procedure and reduce the bloating. If you had a lower endoscopy (such as a colonoscopy or flexible sigmoidoscopy) you may notice spotting of blood in your stool or on the toilet paper. If you underwent a bowel prep for your procedure, you may not have a normal bowel movement for a few days.  Please Note:  You might notice some irritation and congestion in your nose or some drainage.  This is from the oxygen used during your procedure.  There is no need for concern and it should clear up in a day or so.  SYMPTOMS TO REPORT IMMEDIATELY:  Following lower endoscopy (colonoscopy or flexible sigmoidoscopy):  Excessive amounts of blood in the stool  Significant tenderness or worsening of abdominal pains  Swelling of the abdomen that is new, acute  Fever of 100F or higher  Following upper endoscopy (EGD)  Vomiting of blood or coffee ground material  New chest pain or pain under the shoulder blades  Painful or persistently difficult swallowing  New shortness of breath  Fever of 100F or  higher  Black, tarry-looking stools  For urgent or emergent issues, a gastroenterologist can be reached at any hour by calling [phone]. Do not use MyChart messaging for urgent concerns.    DIET:  We do recommend a small meal at first, but then you may proceed to your regular diet.  Drink plenty of fluids but you should avoid alcoholic beverages for 24 hours.  ACTIVITY:  You should plan to take it easy for the rest of today and you should NOT DRIVE or use heavy machinery until tomorrow (because of the sedation medicines used during the test).    FOLLOW UP: Our staff will call the number listed on your records the next business day following your procedure.  We will call around 7:15- 8:00 am to check on you and address any questions or concerns that you may have regarding the information given to you following your procedure. If we do not reach you, we will leave a message.     If any biopsies were taken you will be contacted by phone or by letter within the next 1-3 weeks.  Please call us  at [phone] if you have not heard about the biopsies in 3 weeks.    SIGNATURES/CONFIDENTIALITY: You and/or your care partner have signed paperwork which will be entered into your electronic medical record.  These signatures attest to the fact that that the information above on your After Visit Summary has been reviewed and is understood.  Full responsibility of the confidentiality of this discharge information lies with you  and/or your care-partner.

## 2024-01-11 NOTE — Op Note (Addendum)
 [facility] Patient Name: Gail Bass Procedure Date: 01/11/2024 1:21 PM MRN: [id number] Endoscopist: Gustav ALONSO Mcgee , MD, [id number] Age: 48 Referring MD:  Date of Birth: 03/14/1975 Gender: Female Account #: [id number] Procedure:                Colonoscopy Indications:              Evaluation of unexplained GI bleeding presenting                            with Hematochezia Medicines:                Monitored Anesthesia Care Procedure:                Pre-Anesthesia Assessment:                           - Prior to the procedure, a History and Physical                            was performed, and patient medications and                            allergies were reviewed. The patient's tolerance of                            previous anesthesia was also reviewed. The risks                            and benefits of the procedure and the sedation                            options and risks were discussed with the patient.                            All questions were answered, and informed consent                            was obtained. Prior Anticoagulants: The patient has                            taken no anticoagulant or antiplatelet agents. ASA                            Grade Assessment: II - A patient with mild systemic                            disease. After reviewing the risks and benefits,                            the patient was deemed in satisfactory condition to                            undergo the procedure.  After obtaining informed consent, the colonoscope                            was passed under direct vision. Throughout the                            procedure, the patient's blood pressure, pulse, and                            oxygen saturations were monitored continuously. The                            [id number] Colonoscope [id number] was introduced                            through the anus and advanced to the  the cecum,                            identified by appendiceal orifice and ileocecal                            valve. The colonoscopy was performed without                            difficulty. The patient tolerated the procedure                            well. The quality of the bowel preparation was                            adequate to identify polyps greater than 5 mm in                            size. The ileocecal valve, appendiceal orifice, and                            rectum were photographed. Scope In: 1:50:37 PM Scope Out: 2:01:15 PM Scope Withdrawal Time: 0 hours 6 minutes 44 seconds  Total Procedure Duration: 0 hours 10 minutes 38 seconds  Findings:                 The perianal and digital rectal examinations were                            normal.                           A 3 mm polyp was found in the transverse colon. The                            polyp was sessile. The polyp was removed with a                            cold snare. Resection and retrieval were complete.  A few small-mouthed diverticula were found in the                            sigmoid colon.                           Non-bleeding external and internal hemorrhoids were                            found during retroflexion. The hemorrhoids were                            small. Complications:            No immediate complications. Estimated Blood Loss:     Estimated blood loss was minimal. Impression:               - One 3 mm polyp in the transverse colon, removed                            with a cold snare. Resected and retrieved.                           - Diverticulosis in the sigmoid colon.                           - Non-bleeding external and internal hemorrhoids. Recommendation:           - Patient has a contact number available for                            emergencies. The signs and symptoms of potential                            delayed complications were  discussed with the                            patient. Return to normal activities tomorrow.                            Written discharge instructions were provided to the                            patient.                           - Resume previous diet.                           - Continue present medications.                           - Await pathology results.                           - Repeat colonoscopy in 5-10 years for surveillance  based on pathology results. Gail Bass V. Gail Brill, MD 01/11/2024 2:10:29 PM This report has been signed electronically.

## 2024-01-11 NOTE — Progress Notes (Signed)
 Transferred to PACU via stretcher.  Not responding to stimulation at this time.  VSS upon leaving procedure room.

## 2024-01-12 ENCOUNTER — Other Ambulatory Visit: Payer: Self-pay | Admitting: Gastroenterology

## 2024-01-12 ENCOUNTER — Telehealth: Payer: Self-pay

## 2024-01-12 NOTE — Telephone Encounter (Signed)
  Follow up Call-     01/11/2024   12:50 PM  Call back number  Post procedure Call Back phone  # [phone]  Permission to leave phone message Yes     Patient questions:  Do you have a fever, pain , or abdominal swelling? No. Pain Score  0 *  Have you tolerated food without any problems? Yes.    Have you been able to return to your normal activities? Yes.    Do you have any questions about your discharge instructions: Diet   No. Medications  No. Follow up visit  No.  Do you have questions or concerns about your Care? No.  Actions: * If pain score is 4 or above: No action needed, pain <4.

## 2024-01-14 ENCOUNTER — Encounter: Admitting: Gastroenterology

## 2024-01-14 LAB — SURGICAL PATHOLOGY

## 2024-01-19 ENCOUNTER — Ambulatory Visit: Admitting: Obstetrics and Gynecology

## 2024-01-19 ENCOUNTER — Encounter: Payer: Self-pay | Admitting: Obstetrics and Gynecology

## 2024-01-19 VITALS — BP 136/80 | HR 83 | Temp 98.1°F | Ht 62.75 in | Wt 167.0 lb

## 2024-01-19 DIAGNOSIS — Z01419 Encounter for gynecological examination (general) (routine) without abnormal findings: Secondary | ICD-10-CM

## 2024-01-19 DIAGNOSIS — N951 Menopausal and female climacteric states: Secondary | ICD-10-CM

## 2024-01-19 DIAGNOSIS — N939 Abnormal uterine and vaginal bleeding, unspecified: Secondary | ICD-10-CM | POA: Diagnosis not present

## 2024-01-19 DIAGNOSIS — Z1331 Encounter for screening for depression: Secondary | ICD-10-CM

## 2024-01-19 DIAGNOSIS — G8929 Other chronic pain: Secondary | ICD-10-CM

## 2024-01-19 NOTE — Progress Notes (Signed)
 48 y.o. [id number] female s/p BTL due to prior ectopic pregnancy x3 (2007-2008) with menorrhagia, OAB, HTN, tobacco use here for annual exam. Single. PCP: Janna Ferrier, DO   Patient's last menstrual period was 01/13/2024 (exact date). Period Duration (Days): 4-7 Period Pattern: (!) Irregular Menstrual Flow: Moderate Menstrual Control: Maxi pad Dysmenorrhea: (!) Severe Dysmenorrhea Symptoms: Nausea, Headache  She is interested in Ureaplasma TOC. She reports urinary and vaginal symptoms have resolved.However, she is worried about it coming back. She has not been SA since July. Urine sample provided: no  Abnormal bleeding: irregular bleeding with POP Pelvic discharge or pain: none Breast mass, nipple discharge or skin changes : none  Sexually active: yes Birth control: BTL Last PAP:     Component Value Date/Time   DIAGPAP (A) 09/24/2022 1340    - Atypical squamous cells of undetermined significance (ASC-US )   DIAGPAP  10/01/2017 0000    NEGATIVE FOR INTRAEPITHELIAL LESIONS OR MALIGNANCY.   HPVHIGH Negative 09/24/2022 1340   ADEQPAP  09/24/2022 1340    Satisfactory for evaluation; transformation zone component PRESENT.   ADEQPAP  10/01/2017 0000    Satisfactory for evaluation  endocervical/transformation zone component PRESENT.   Last mammogram: 10/14/22 birads 1, density B  Last colonoscopy: 01/11/24 q22yr  Exercising: no Smoker: yes  Garment/textile Technologist Visit from 01/19/2024 in [facility]  PHQ-2 Total Score 0    Flowsheet Row Office Visit from 08/31/2023 in [facility] Of . [facility]  PHQ-9 Total Score 0     GYN HISTORY: 2025- Chronic pelvic pain syndrome likely secondary to chronic PID (history of chronic BV, positive M.hominis and Ureaplasma) with associated dyspareunia, and OAB, referred to ID for treatment  OB History  Gravida Para Term Preterm AB Living  6 3 3  3 3   SAB IAB Ectopic  Multiple Live Births  3    3    # Outcome Date GA Lbr Len/2nd Weight Sex Type Anes PTL Lv  6 Term      Vag-Spont   LIV  5 Term      Vag-Spont   LIV  4 Term      Vag-Spont   LIV  3 SAB           2 SAB           1 SAB            Past Medical History:  Diagnosis Date   Abscess of buttock 08/13/2019   Asthma    Bacterial vaginosis    Boil 06/14/2019   BV (bacterial vaginosis) 09/13/2008        Gastritis    Hypertension 05/11/2013   Kidney stone    OVARIAN CYST, LEFT 10/29/2008   Qualifier: Diagnosis of  By: Lucille  MD, Taineisha     Pain in the chest 06/06/2015   Painful lumpy right breast 03/21/2013   Recurrent cold sores 12/05/2015   Recurrent sinusitis 06/06/2015   Sinus congestion 04/25/2018   TUBAL PREGNANCY 09/13/2008   Qualifier: History of  By: Lucille  MD, Taineisha     Vaginal discharge 01/10/2014   Vaginal discharge 01/10/2014   Past Surgical History:  Procedure Laterality Date   CYSTECTOMY     CYSTOSCOPY WITH RETROGRADE PYELOGRAM, URETEROSCOPY AND STENT PLACEMENT Left 02/22/2014   Procedure: CYSTOSCOPY WITH RETROGRADE PYELOGRAM, URETEROSCOPY, STONE BASKET EXTRACTION ;  Surgeon: Arlena LILLETTE Gal, MD;  Location: [facility];  Service: Urology;  Laterality: Left;   tailbone cyst     TUBAL LIGATION     Current Outpatient Medications on File Prior to Visit  Medication Sig Dispense Refill   acyclovir  (ZOVIRAX ) 400 MG tablet TAKE 1 TABLET(400 MG) BY MOUTH TWICE DAILY 180 tablet 0   albuterol  (VENTOLIN  HFA) 108 (90 Base) MCG/ACT inhaler Inhale 2 puffs into the lungs every 6 (six) hours as needed. 18 g 3   cetirizine  (ZYRTEC ) 10 MG tablet TAKE 1 TABLET(10 MG) BY MOUTH DAILY 90 tablet 1   famotidine  (PEPCID ) 40 MG tablet Take 1 tablet (40 mg total) by mouth daily. 30 tablet 0   fluticasone  (FLONASE ) 50 MCG/ACT nasal spray SHAKE LIQUID AND USE 2 SPRAYS IN EACH NOSTRIL DAILY 16 g 12   hydrocortisone  (ANUSOL -HC) 2.5 % rectal cream Place 1 Application rectally 2 (two) times  daily. 30 g 1   LYSINE  PO Take by mouth.     Multiple Vitamins-Minerals (ULTRA WOMENS PACK PO) Take by mouth.     NIFEdipine  (PROCARDIA -XL/NIFEDICAL-XL) 30 MG 24 hr tablet Take 1 tablet (30 mg total) by mouth daily. 90 tablet 3   omeprazole  (PRILOSEC) 40 MG capsule TAKE 1 CAPSULE(40 MG) BY MOUTH DAILY 90 capsule 1   ondansetron  (ZOFRAN ) 4 MG tablet Take 4 mg by mouth every 8 (eight) hours as needed.     Probiotic Product (PROBIOTIC PO) Take by mouth.     SODIUM FLUORIDE  5000 PLUS 1.1 % CREA dental cream Place 1 Application onto teeth at bedtime.     meloxicam  (MOBIC ) 15 MG tablet Take 1 tablet (15 mg total) by mouth daily. (Patient not taking: Reported on 01/19/2024) 30 tablet 3   No current facility-administered medications on file prior to visit.   Social History   Socioeconomic History   Marital status: Single    Spouse name: Not on file   Number of children: Not on file   Years of education: Not on file   Highest education level: 10th grade  Occupational History   Not on file  Tobacco Use   Smoking status: Every Day    Current packs/day: 0.50    Average packs/day: 0.5 packs/day for 11.2 years (5.6 ttl pk-yrs)    Types: Cigarettes    Start date: 11/19/2012    Passive exposure: Current   Smokeless tobacco: Never   Tobacco comments:    Reports attempting cessation with patches.   Vaping Use   Vaping status: Never Used  Substance and Sexual Activity   Alcohol use: No   Drug use: Yes    Types: Marijuana    Comment: CBD vape   Sexual activity: Yes    Partners: Male    Birth control/protection: Surgical    Comment: Tubal ligation  Other Topics Concern   Not on file  Social History Narrative   Has 3 children and one adopted daughter.   Works.    [employer].   Mother lives in [place]. They are estranged because her mother practices withcraft.    Social Drivers of Health   Financial Resource Strain: Medium Risk (08/30/2023)   Overall Financial Resource Strain (CARDIA)     Difficulty of Paying Living Expenses: Somewhat hard  Food Insecurity: No Food Insecurity (08/30/2023)   Hunger Vital Sign    Worried About Running Out of Food in the Last Year: Never true    Ran Out of Food in the Last Year: Never true  Transportation Needs: No Transportation Needs (08/30/2023)   PRAPARE - Transportation    Lack  of Transportation (Medical): No    Lack of Transportation (Non-Medical): No  Physical Activity: Unknown (08/30/2023)   Exercise Vital Sign    Days of Exercise per Week: 5 days    Minutes of Exercise per Session: Patient declined  Stress: No Stress Concern Present (08/30/2023)   [employer] of Occupational Health - Occupational Stress Questionnaire    Feeling of Stress: Not at all  Social Connections: Moderately Integrated (08/30/2023)   Social Connection and Isolation Panel    Frequency of Communication with Friends and Family: More than three times a week    Frequency of Social Gatherings with Friends and Family: Twice a week    Attends Religious Services: More than 4 times per year    Active Member of [employer] or Organizations: Yes    Attends Banker Meetings: 1 to 4 times per year    Marital Status: Never married  Catering Manager Violence: Not on file   Family History  Problem Relation Age of Onset   Heart Problems Mother    Heart failure Mother    Arrhythmia Father        Pacemakers placed didn't take passed away 3 moths later   Arrhythmia Sister        Pacemaker didn't take to body passed away 3 months ;later   Heart failure Maternal Grandmother    Hypertension Maternal Grandfather    Hypertension Maternal Aunt    Cervical cancer Maternal Aunt        stage 4 type B   Endometrial cancer Maternal Aunt    Endometriosis Maternal Aunt    Colon cancer Maternal Aunt    Colon polyps Maternal Aunt    Colon cancer Maternal Uncle    Colon cancer Maternal Uncle    Allergies  Allergen Reactions   Flagyl  [Metronidazole ] Other (See Comments)     Migraine & Nausea   Doxycycline  Other (See Comments)    REACTION: Emesis     PE Today's Vitals   01/19/24 1505  BP: 136/80  Pulse: 83  Temp: 98.1 F (36.7 C)  TempSrc: Oral  SpO2: 98%  Weight: 167 lb (75.8 kg)  Height: 5' 2.75 (1.594 m)   Body mass index is 29.82 kg/m.  Physical Exam Vitals reviewed. Exam conducted with a chaperone present.  Constitutional:      General: She is not in acute distress.    Appearance: Normal appearance.  HENT:     Head: Normocephalic and atraumatic.     Nose: Nose normal.  Eyes:     Extraocular Movements: Extraocular movements intact.     Conjunctiva/sclera: Conjunctivae normal.  Pulmonary:     Effort: Pulmonary effort is normal.  Chest:     Chest wall: No mass or tenderness.  Breasts:    Right: Normal. No swelling, mass, nipple discharge, skin change or tenderness.     Left: Normal. No swelling, mass, nipple discharge, skin change or tenderness.  Abdominal:     General: There is no distension.     Palpations: Abdomen is soft.     Tenderness: There is no abdominal tenderness.  Genitourinary:    General: Normal vulva.     Exam position: Lithotomy position.     Urethra: No prolapse.     Vagina: Normal. No vaginal discharge or bleeding.     Cervix: Normal. No lesion.     Uterus: Normal. Not enlarged and not tender.      Adnexa: Right adnexa normal and left adnexa normal.  Comments: +menses Musculoskeletal:        General: Normal range of motion.     Cervical back: Normal range of motion.  Lymphadenopathy:     Upper Body:     Right upper body: No axillary adenopathy.     Left upper body: No axillary adenopathy.     Lower Body: No right inguinal adenopathy. No left inguinal adenopathy.  Skin:    General: Skin is warm and dry.  Neurological:     General: No focal deficit present.     Mental Status: She is alert.  Psychiatric:        Mood and Affect: Mood normal.        Behavior: Behavior normal.       Assessment  and Plan:        Well woman exam with routine gynecological exam Assessment & Plan: Cervical cancer screening performed according to ASCCP guidelines. Encouraged annual mammogram screening, due! Colonoscopy UTD  Labs and immunizations with her primary Encouraged safe sexual practices as indicated Encouraged healthy lifestyle practices with diet and exercise For patients under 50yo, I recommend 1000mg  calcium  daily and 600IU of vitamin D  daily.    Abnormal uterine bleeding (AUB) Assessment & Plan: Recent STD test normal TVUS also normal CBC wnl 2024, and hormonal labs normal EMB benign Will transition to aygestin  for improved bleeding profile, has BTL for contraception  Orders: -     Norethindrone  Acetate; Take 1 tablet (5 mg total) by mouth daily.  Dispense: 90 tablet; Refill: 3  Negative depression screening  Perimenopause  Discussed hormonal and nonhormonal options Will try estroven supplement  Airon Sahni V Giang Hemme, MD

## 2024-01-19 NOTE — Patient Instructions (Signed)

## 2024-01-31 ENCOUNTER — Other Ambulatory Visit: Payer: Self-pay | Admitting: Family Medicine

## 2024-01-31 DIAGNOSIS — B001 Herpesviral vesicular dermatitis: Secondary | ICD-10-CM

## 2024-01-31 DIAGNOSIS — Z01419 Encounter for gynecological examination (general) (routine) without abnormal findings: Secondary | ICD-10-CM | POA: Insufficient documentation

## 2024-01-31 MED ORDER — NORETHINDRONE ACETATE 5 MG PO TABS: 5.0000 mg | ORAL_TABLET | Freq: Every day | ORAL | 3 refills | Status: DC

## 2024-01-31 NOTE — Assessment & Plan Note (Signed)
 Recent STD test normal TVUS also normal CBC wnl 2024, and hormonal labs normal EMB benign Will transition to aygestin  for improved bleeding profile, has BTL for contraception

## 2024-01-31 NOTE — Assessment & Plan Note (Signed)
 Cervical cancer screening performed according to ASCCP guidelines. Encouraged annual mammogram screening, due! Colonoscopy UTD  Labs and immunizations with her primary Encouraged safe sexual practices as indicated Encouraged healthy lifestyle practices with diet and exercise For patients under 46yo, I recommend 1000mg  calcium  daily and 600IU of vitamin D  daily.

## 2024-02-08 ENCOUNTER — Telehealth: Payer: Self-pay

## 2024-02-08 DIAGNOSIS — N939 Abnormal uterine and vaginal bleeding, unspecified: Secondary | ICD-10-CM

## 2024-02-08 MED ORDER — NORETHINDRONE 0.35 MG PO TABS: 1.0000 | ORAL_TABLET | Freq: Every day | ORAL | 4 refills | Status: AC

## 2024-02-08 NOTE — Telephone Encounter (Signed)
 Spoke with patient regarding the Aygestin  5 mg pill. She states that she is not ready to take this medication yet due to fear that it will affect her libido. She would like to know if she could have a 3 month refill on the Mini pill. She says that with the Mini pill she is still having a period monthly and would like to just not change anything at this time.

## 2024-02-08 NOTE — Telephone Encounter (Signed)
 Medication change provided.

## 2024-02-09 NOTE — Telephone Encounter (Signed)
 Call placed to patient, left detailed message, ok per dpr.  Advised of med change per Dr. Dallie. Return call to office at [phone], option 4,  if any additional questions.   Encounter closed.

## 2024-02-21 ENCOUNTER — Ambulatory Visit: Payer: Self-pay | Admitting: Gastroenterology

## 2024-03-15 ENCOUNTER — Other Ambulatory Visit: Payer: Self-pay | Admitting: Family Medicine

## 2024-03-15 DIAGNOSIS — J302 Other seasonal allergic rhinitis: Secondary | ICD-10-CM

## 2024-03-25 ENCOUNTER — Ambulatory Visit (HOSPITAL_COMMUNITY)

## 2024-03-25 ENCOUNTER — Ambulatory Visit (HOSPITAL_COMMUNITY)
Admission: RE | Admit: 2024-03-25 | Discharge: 2024-03-25 | Disposition: A | Discharge status: Home / Self Care | Source: Ambulatory Visit | Attending: Family Medicine | Admitting: Family Medicine

## 2024-03-25 ENCOUNTER — Encounter (HOSPITAL_COMMUNITY): Payer: Self-pay

## 2024-03-25 VITALS — BP 142/90 | HR 106 | Temp 98.4°F | Resp 20

## 2024-03-25 DIAGNOSIS — J4521 Mild intermittent asthma with (acute) exacerbation: Secondary | ICD-10-CM

## 2024-03-25 DIAGNOSIS — R9389 Abnormal findings on diagnostic imaging of other specified body structures: Secondary | ICD-10-CM

## 2024-03-25 DIAGNOSIS — R0602 Shortness of breath: Secondary | ICD-10-CM

## 2024-03-25 MED ORDER — PREDNISONE 20 MG PO TABS: 40.0000 mg | ORAL_TABLET | Freq: Every day | ORAL | 0 refills | Status: AC

## 2024-03-25 MED ORDER — ALBUTEROL SULFATE HFA 108 (90 BASE) MCG/ACT IN AERS: 2.0000 | INHALATION_SPRAY | RESPIRATORY_TRACT | 0 refills | Status: DC | PRN

## 2024-03-25 MED ORDER — AZITHROMYCIN 250 MG PO TABS: ORAL_TABLET | ORAL | 0 refills | Status: AC

## 2024-03-25 MED ORDER — PROMETHAZINE-DM 6.25-15 MG/5ML PO SYRP: 5.0000 mL | ORAL_SOLUTION | Freq: Four times a day (QID) | ORAL | 0 refills | Status: AC | PRN

## 2024-03-25 MED ORDER — PREDNISONE 20 MG PO TABS: 40.0000 mg | ORAL_TABLET | Freq: Every day | ORAL | 0 refills | Status: DC

## 2024-03-25 MED ORDER — ALBUTEROL SULFATE HFA 108 (90 BASE) MCG/ACT IN AERS: 2.0000 | INHALATION_SPRAY | RESPIRATORY_TRACT | 0 refills | Status: AC | PRN

## 2024-03-25 MED ORDER — AZITHROMYCIN 250 MG PO TABS: ORAL_TABLET | ORAL | 0 refills | Status: DC

## 2024-03-25 NOTE — ED Provider Notes (Signed)
 " [facility]    CSN: [id number] Arrival date & time: 03/25/24  1536      History   Chief Complaint Chief Complaint  Patient presents with   Appointment   Shortness of Breath    HPI Gail Bass is a 49 y.o. female.    Shortness of Breath  Here for cough and shortness of breath and wheezing.  About 2 weeks ago she began having nasal drainage and congestion and cough.  Then about 1 week ago she started wheezing and having shortness of breath.  The congestion in her nose has continued and she is still coughing.  She had fever about 2 weeks ago at the first of the illness but has not had any in the last few days.  No nausea or vomiting  She is allergic to Flagyl  and doxycycline , both of which caused nausea and vomiting.  Last menstrual cycle was December 25.  When she was first getting sick she did do a home COVID test that was negative.   Past Medical History:  Diagnosis Date   Abscess of buttock 08/13/2019   Asthma    Bacterial vaginosis    Boil 06/14/2019   BV (bacterial vaginosis) 09/13/2008        Gastritis    Hypertension 05/11/2013   Kidney stone    OVARIAN CYST, LEFT 10/29/2008   Qualifier: Diagnosis of  By: Lucille  MD, Taineisha     Pain in the chest 06/06/2015   Painful lumpy right breast 03/21/2013   Recurrent cold sores 12/05/2015   Recurrent sinusitis 06/06/2015   Sinus congestion 04/25/2018   TUBAL PREGNANCY 09/13/2008   Qualifier: History of  By: Lucille  MD, Taineisha     Vaginal discharge 01/10/2014   Vaginal discharge 01/10/2014    Patient Active Problem List   Diagnosis Date Noted   Well woman exam with routine gynecological exam 01/31/2024   Chronic pelvic pain in female 01/13/2023   Abnormal uterine bleeding (AUB) 12/30/2022   History of ectopic pregnancy 12/30/2022   Screening for cervical cancer 09/24/2022   Screening for colon cancer 09/24/2022   Screening examination for STD (sexually transmitted disease) 09/24/2022    Hot flashes 05/10/2022   Milia of right eyelid 08/27/2021   Viral illness 02/27/2021   Back pain 02/13/2021   Overactive bladder 07/03/2020   Dyspareunia, female 07/03/2020   Mixed stress and urge urinary incontinence 04/11/2020   History of COVID-19 03/13/2020   Seasonal allergies 06/14/2019   PTSD (post-traumatic stress disorder) 12/05/2015   External hemorrhoids 08/07/2015   Vaginal discharge 01/10/2014   Hypertension 05/11/2013   Tobacco use disorder 03/21/2013   Asthma 12/23/2009   Migraine headache 09/13/2008   Depression 06/21/2008    Past Surgical History:  Procedure Laterality Date   CYSTECTOMY     CYSTOSCOPY WITH RETROGRADE PYELOGRAM, URETEROSCOPY AND STENT PLACEMENT Left 02/22/2014   Procedure: CYSTOSCOPY WITH RETROGRADE PYELOGRAM, URETEROSCOPY, STONE BASKET EXTRACTION ;  Surgeon: Arlena LILLETTE Gal, MD;  Location: [facility];  Service: Urology;  Laterality: Left;   tailbone cyst     TUBAL LIGATION      OB History     Gravida  6   Para  3   Term  3   Preterm      AB  3   Living  3      SAB  3   IAB      Ectopic      Multiple      Live  Births  3            Home Medications    Prior to Admission medications  Medication Sig Start Date End Date Taking? Authorizing Provider  acyclovir  (ZOVIRAX ) 400 MG tablet TAKE 1 TABLET(400 MG) BY MOUTH TWICE DAILY 01/31/24  Yes Janna Ferrier, DO  Budesonide-Formoterol Fumarate (SYMBICORT  IN) Inhale into the lungs.   Yes [provider]  cetirizine  (ZYRTEC ) 10 MG tablet TAKE 1 TABLET(10 MG) BY MOUTH DAILY 03/16/24  Yes Gomes, Adriana, DO  NIFEdipine  (PROCARDIA -XL/NIFEDICAL-XL) 30 MG 24 hr tablet Take 1 tablet (30 mg total) by mouth daily. 08/31/23  Yes Janna Ferrier, DO  norethindrone  (MICRONOR ) 0.35 MG tablet Take 1 tablet (0.35 mg total) by mouth daily. 02/08/24  Yes Hines, Genesis V, MD  omeprazole  (PRILOSEC) 40 MG capsule TAKE 1 CAPSULE(40 MG) BY MOUTH DAILY 01/12/24  Yes Heinz, Sara E, PA-C   promethazine -dextromethorphan (PROMETHAZINE -DM) 6.25-15 MG/5ML syrup Take 5 mLs by mouth 4 (four) times daily as needed for cough. 03/25/24  Yes Vonna Sharlet POUR, MD  albuterol  (VENTOLIN  HFA) 108 [phone] Base) MCG/ACT inhaler Inhale 2 puffs into the lungs every 4 (four) hours as needed for wheezing or shortness of breath. 03/25/24   Vonna Sharlet POUR, MD  azithromycin  (ZITHROMAX ) 250 MG tablet Take first 2 tablets together, then 1 every day until finished. 03/25/24   Vonna Sharlet POUR, MD  famotidine  (PEPCID ) 40 MG tablet Take 1 tablet (40 mg total) by mouth daily. 10/12/23   McDonald, Juliene SAUNDERS, DPM  fluticasone  (FLONASE ) 50 MCG/ACT nasal spray SHAKE LIQUID AND USE 2 SPRAYS IN EACH NOSTRIL DAILY 04/29/22   Macario Dorothyann HERO, MD  hydrocortisone  (ANUSOL -HC) 2.5 % rectal cream Place 1 Application rectally 2 (two) times daily. 06/21/23   Gomes, Adriana, DO  LYSINE  PO Take by mouth.    [provider]  meloxicam  (MOBIC ) 15 MG tablet Take 1 tablet (15 mg total) by mouth daily. Patient not taking: Reported on 01/19/2024 06/22/23   Silva Juliene SAUNDERS, DPM  Multiple Vitamins-Minerals (ULTRA WOMENS PACK PO) Take by mouth.    [provider]  ondansetron  (ZOFRAN ) 4 MG tablet Take 4 mg by mouth every 8 (eight) hours as needed. 10/04/23   [provider]  predniSONE  (DELTASONE ) 20 MG tablet Take 2 tablets (40 mg total) by mouth daily with breakfast for 5 days. 03/25/24 03/30/24  Shiloh Southern K, MD  Probiotic Product (PROBIOTIC PO) Take by mouth.    [provider]  SODIUM FLUORIDE  5000 PLUS 1.1 % CREA dental cream Place 1 Application onto teeth at bedtime. 09/07/23   [provider]    Family History Family History  Problem Relation Age of Onset   Heart Problems Mother    Heart failure Mother    Arrhythmia Father        Pacemakers placed didn't take passed away 3 moths later   Arrhythmia Sister        Pacemaker didn't take to body passed away 3 months ;later   Heart  failure Maternal Grandmother    Hypertension Maternal Grandfather    Hypertension Maternal Aunt    Cervical cancer Maternal Aunt        stage 4 type B   Endometrial cancer Maternal Aunt    Endometriosis Maternal Aunt    Colon cancer Maternal Aunt    Colon polyps Maternal Aunt    Colon cancer Maternal Uncle    Colon cancer Maternal Uncle     Social History Social History[1]  Allergies   Flagyl  [metronidazole ] and Doxycycline    Review of Systems Review of Systems  Respiratory:  Positive for shortness of breath.      Physical Exam Triage Vital Signs ED Triage Vitals  Encounter Vitals Group     BP 03/25/24 1559 (!) 142/90     Girls Systolic BP Percentile --      Girls Diastolic BP Percentile --      Boys Systolic BP Percentile --      Boys Diastolic BP Percentile --      Pulse Rate 03/25/24 1555 (!) 106     Resp 03/25/24 1555 20     Temp 03/25/24 1555 98.4 F (36.9 C)     Temp Source 03/25/24 1555 Oral     SpO2 03/25/24 1555 94 %     Weight --      Height --      Head Circumference --      Peak Flow --      Pain Score 03/25/24 1553 8     Pain Loc --      Pain Education --      Exclude from Growth Chart --    No data found.  Updated Vital Signs BP (!) 142/90 (BP Location: Left Arm)   Pulse (!) 106   Temp 98.4 F (36.9 C) (Oral)   Resp 20   LMP 02/24/2024 (Approximate)   SpO2 94%   Visual Acuity Right Eye Distance:   Left Eye Distance:   Bilateral Distance:    Right Eye Near:   Left Eye Near:    Bilateral Near:     Physical Exam Vitals reviewed.  Constitutional:      General: She is not in acute distress.    Appearance: She is not ill-appearing, toxic-appearing or diaphoretic.  HENT:     Right Ear: Tympanic membrane and ear canal normal.     Left Ear: Tympanic membrane and ear canal normal.     Nose: Congestion present.     Mouth/Throat:     Mouth: Mucous membranes are moist.     Pharynx: No oropharyngeal exudate or posterior oropharyngeal  erythema.  Eyes:     Extraocular Movements: Extraocular movements intact.     Conjunctiva/sclera: Conjunctivae normal.     Pupils: Pupils are equal, round, and reactive to light.  Cardiovascular:     Rate and Rhythm: Normal rate and regular rhythm.     Heart sounds: No murmur heard. Pulmonary:     Effort: No respiratory distress.     Breath sounds: No stridor. No rhonchi or rales.     Comments: There is expiratory wheezing heard, mainly in the lower lung fields. Musculoskeletal:     Cervical back: Neck supple.  Lymphadenopathy:     Cervical: No cervical adenopathy.  Skin:    Capillary Refill: Capillary refill takes less than 2 seconds.     Coloration: Skin is not jaundiced or pale.  Neurological:     General: No focal deficit present.     Mental Status: She is alert and oriented to person, place, and time.  Psychiatric:        Behavior: Behavior normal.      UC Treatments / Results  Labs (all labs ordered are listed, but only abnormal results are displayed) Labs Reviewed - No data to display  EKG   Radiology DG Chest 2 View Result Date: 03/25/2024 EXAM: 2 VIEW(S) XRAY OF THE CHEST 03/25/2024 04:56:31 PM COMPARISON: 02/21/2022 CLINICAL HISTORY: Cough and wheezing.  FINDINGS: LUNGS AND PLEURA: Right middle lobe nodularity on lateral view. No pleural effusion. No pneumothorax. HEART AND MEDIASTINUM: No acute abnormality of the cardiac and mediastinal silhouettes. BONES AND SOFT TISSUES: No acute osseous abnormality. IMPRESSION: 1. Right middle lobe nodularity on lateral view. Electronically signed by: Morgane Naveau MD 03/25/2024 05:07 PM EST RP Workstation: [id number]    Procedures Procedures (including critical care time)  Medications Ordered in UC Medications - No data to display  Initial Impression / Assessment and Plan / UC Course  I have reviewed the triage vital signs and the nursing notes.  Pertinent labs & imaging results that were available during my care of  the patient were reviewed by me and considered in my medical decision making (see chart for details).     Chest x-ray shows a nodularity on the lateral view. I discussed this with her and asked her to follow-up with primary care for possible repeat chest x-ray or other further testing In case that is focus of infection, a Z-Pak is sent into the pharmacy.  Albuterol  and prednisone  are sent in to treat the asthma exacerbation and Phenergan  with dextromethorphan is sent in for her cough.  Work note is provided   Final Clinical Impressions(s) / UC Diagnoses   Final diagnoses:  Mild intermittent asthma with acute exacerbation  Shortness of breath  Abnormal chest xray     Discharge Instructions      There is no obvious pneumonia on the x-ray, but the radiologist does see a nodule or density on the side view.  So I do want to treat you with an antibiotic in case that is an infection causing that.  Azithromycin  250 mg--take 2 orally the first day, then 1 daily for 4 more days.  Albuterol  inhaler--do 2 puffs every 4 hours as needed for shortness of breath or wheezing  Take prednisone  20 mg--2 daily for 5 days  Take Phenergan  with dextromethorphan syrup--5 mL or 1 teaspoon every 6 hours as needed for cough   Please follow-up with your primary care so that they can help you decide about repeating the x-ray or if they should do other testing.       ED Prescriptions     Medication Sig Dispense Auth. Provider   albuterol  (VENTOLIN  HFA) 108 (90 Base) MCG/ACT inhaler  (Status: Discontinued) Inhale 2 puffs into the lungs every 4 (four) hours as needed for wheezing or shortness of breath. 1 each Vonna Sharlet POUR, MD   predniSONE  (DELTASONE ) 20 MG tablet  (Status: Discontinued) Take 2 tablets (40 mg total) by mouth daily with breakfast for 5 days. 10 tablet Vonna Sharlet POUR, MD   azithromycin  (ZITHROMAX ) 250 MG tablet  (Status: Discontinued) Take first 2 tablets together, then 1  every day until finished. 6 tablet Vonna Sharlet POUR, MD   albuterol  (VENTOLIN  HFA) 108 (90 Base) MCG/ACT inhaler Inhale 2 puffs into the lungs every 4 (four) hours as needed for wheezing or shortness of breath. 1 each Vonna Sharlet POUR, MD   azithromycin  (ZITHROMAX ) 250 MG tablet Take first 2 tablets together, then 1 every day until finished. 6 tablet Vonna Sharlet POUR, MD   predniSONE  (DELTASONE ) 20 MG tablet Take 2 tablets (40 mg total) by mouth daily with breakfast for 5 days. 10 tablet Amen Staszak K, MD   promethazine -dextromethorphan (PROMETHAZINE -DM) 6.25-15 MG/5ML syrup Take 5 mLs by mouth 4 (four) times daily as needed for cough. 118 mL Vonna Sharlet POUR, MD      PDMP not reviewed  this encounter.     [1]  Social History Tobacco Use   Smoking status: Every Day    Current packs/day: 0.50    Average packs/day: 0.5 packs/day for 11.3 years (5.7 ttl pk-yrs)    Types: Cigarettes    Start date: 11/19/2012    Passive exposure: Current   Smokeless tobacco: Never   Tobacco comments:    Reports attempting cessation with patches.   Vaping Use   Vaping status: Never Used  Substance Use Topics   Alcohol use: No   Drug use: Yes    Types: Marijuana    Comment: CBD vape     Vonna Sharlet POUR, MD 03/25/24 1741  "

## 2024-03-25 NOTE — ED Triage Notes (Signed)
 Onset 2 weeks ago with a viral illness. Patient works in an engineer, [employer]. Tried multiple otc meds with no relief.   Patient took 5 days of Cefdinir  with the last dose this past Monday with no relief. Patient has asthma and starting to have SOB and trouble breathing.   Current symptoms congestion, runny nose, dry cough, SOB, wheezing, and fatigue.

## 2024-03-25 NOTE — Discharge Instructions (Addendum)
 There is no obvious pneumonia on the x-ray, but the radiologist does see a nodule or density on the side view.  So I do want to treat you with an antibiotic in case that is an infection causing that.  Azithromycin  250 mg--take 2 orally the first day, then 1 daily for 4 more days.  Albuterol  inhaler--do 2 puffs every 4 hours as needed for shortness of breath or wheezing  Take prednisone  20 mg--2 daily for 5 days  Take Phenergan  with dextromethorphan syrup--5 mL or 1 teaspoon every 6 hours as needed for cough   Please follow-up with your primary care so that they can help you decide about repeating the x-ray or if they should do other testing.

## 2024-04-06 ENCOUNTER — Ambulatory Visit: Admitting: Podiatry

## 2024-04-06 VITALS — Ht 62.75 in | Wt 167.0 lb

## 2024-04-06 DIAGNOSIS — B351 Tinea unguium: Secondary | ICD-10-CM

## 2024-04-06 DIAGNOSIS — L600 Ingrowing nail: Secondary | ICD-10-CM

## 2024-04-06 NOTE — Progress Notes (Signed)
"  °  Subjective:  Patient ID: Gail Bass, female    DOB: 1975/07/06,  MRN: [id number]  Chief Complaint  Patient presents with   Foot Problem    RM 21 Patient is here for pf recheck. Patient states a great improvement in pain from pf. Pt is concerned with a possibe nail fungus and ingrown toenail of the left hallux.    49 y.o. female presents with the above complaint. History confirmed with patient.  She returns for follow-up, her plantar fasciitis has nearly completely resolved.  She had a pedicure recently and thinks he may have left a piece of ingrown nail and she also notes discoloration and thickening of the nail of both great toenails and wonders if fungus could be a possibility.  Objective:  Physical Exam: warm, good capillary refill, no trophic changes or ulcerative lesions, normal DP and PT pulses, normal sensory exam, and bilateral she has hallux pincer nail deformity with thickening and dystrophy and discoloration of the distal central portion bilaterally there is pain to palpation of the left lateral border that is ingrown.  Assessment:     ICD-10-CM   1. Onychomycosis  B35.1 Fungus culture w smear        Plan:  Patient was evaluated and treated and all questions answered.  Discussed possibility of onychomycosis as a reason for the changes and thickening in both great toenails not just the left side, I recommended sampling the nail plate for staining for possible onychomycosis.  Samples taken and sent to [facility].  We discussed oral treatment if there is positive fungus  Regarding her ingrown nail of the left lateral border this has been very painful for her does not show signs of acute infection.  Following consent and sterile prep with alcohol the left hallux was anesthetized with lidocaine  and Marcaine, reprepped with Betadine and the nail was debrided sharply and the offending border was removed.  A bandage was applied and post care instructions were given.   Remaining borders were debrided in slant back fashion.  Discussed with her she will likely benefit from partial her matricectomy she would like to do this in the summer and will return to see me as needed for this.  No follow-ups on file.   "

## 2025-01-22 ENCOUNTER — Ambulatory Visit: Admitting: Obstetrics and Gynecology
